# Patient Record
Sex: Female | Born: 1940 | Race: White | Hispanic: No | Marital: Married | Smoking: Never smoker
Health system: Southern US, Community
[De-identification: ages and names within clinical notes are randomized; demographics above are authoritative.]

## PROBLEM LIST (undated history)

## (undated) DIAGNOSIS — K219 Gastro-esophageal reflux disease without esophagitis: Secondary | ICD-10-CM

## (undated) DIAGNOSIS — E611 Iron deficiency: Secondary | ICD-10-CM

## (undated) DIAGNOSIS — F411 Generalized anxiety disorder: Secondary | ICD-10-CM

## (undated) DIAGNOSIS — IMO0002 Reserved for concepts with insufficient information to code with codable children: Secondary | ICD-10-CM

## (undated) DIAGNOSIS — E1140 Type 2 diabetes mellitus with diabetic neuropathy, unspecified: Secondary | ICD-10-CM

## (undated) DIAGNOSIS — N200 Calculus of kidney: Secondary | ICD-10-CM

## (undated) DIAGNOSIS — R197 Diarrhea, unspecified: Secondary | ICD-10-CM

## (undated) DIAGNOSIS — E11610 Type 2 diabetes mellitus with diabetic neuropathic arthropathy: Secondary | ICD-10-CM

## (undated) DIAGNOSIS — C50919 Malignant neoplasm of unspecified site of unspecified female breast: Secondary | ICD-10-CM

## (undated) DIAGNOSIS — G629 Polyneuropathy, unspecified: Secondary | ICD-10-CM

## (undated) DIAGNOSIS — F419 Anxiety disorder, unspecified: Secondary | ICD-10-CM

## (undated) DIAGNOSIS — B029 Zoster without complications: Secondary | ICD-10-CM

## (undated) DIAGNOSIS — E785 Hyperlipidemia, unspecified: Secondary | ICD-10-CM

## (undated) DIAGNOSIS — C4491 Basal cell carcinoma of skin, unspecified: Secondary | ICD-10-CM

## (undated) DIAGNOSIS — H269 Unspecified cataract: Secondary | ICD-10-CM

## (undated) DIAGNOSIS — S86012A Strain of left Achilles tendon, initial encounter: Secondary | ICD-10-CM

## (undated) HISTORY — DX: Reserved for concepts with insufficient information to code with codable children: IMO0002

## (undated) HISTORY — DX: Type 2 diabetes mellitus with diabetic neuropathic arthropathy: E11.610

## (undated) HISTORY — DX: Unspecified cataract: H26.9

## (undated) HISTORY — PX: OTHER SURGICAL HISTORY: SHX169

## (undated) HISTORY — DX: Hyperlipidemia, unspecified: E78.5

## (undated) HISTORY — PX: TUBAL LIGATION: SHX77

## (undated) HISTORY — DX: Basal cell carcinoma of skin, unspecified: C44.91

## (undated) HISTORY — PX: ABDOMINAL HYSTERECTOMY: SHX81

## (undated) HISTORY — DX: Generalized anxiety disorder: F41.1

## (undated) HISTORY — DX: Gastro-esophageal reflux disease without esophagitis: K21.9

## (undated) HISTORY — DX: Calculus of kidney: N20.0

## (undated) HISTORY — PX: FOOT SURGERY: SHX648

## (undated) HISTORY — DX: Zoster without complications: B02.9

## (undated) HISTORY — PX: EYE SURGERY: SHX253

---

## 1998-10-13 DIAGNOSIS — N200 Calculus of kidney: Secondary | ICD-10-CM

## 1998-10-13 HISTORY — DX: Calculus of kidney: N20.0

## 1999-09-05 ENCOUNTER — Encounter: Payer: Self-pay | Admitting: Emergency Medicine

## 1999-09-05 ENCOUNTER — Emergency Department (HOSPITAL_COMMUNITY): Admission: EM | Admit: 1999-09-05 | Discharge: 1999-09-05 | Payer: Self-pay | Admitting: Emergency Medicine

## 2002-10-13 DIAGNOSIS — B029 Zoster without complications: Secondary | ICD-10-CM

## 2002-10-13 HISTORY — DX: Zoster without complications: B02.9

## 2005-09-29 ENCOUNTER — Inpatient Hospital Stay (HOSPITAL_COMMUNITY): Admission: EM | Admit: 2005-09-29 | Discharge: 2005-10-03 | Payer: Self-pay | Admitting: Internal Medicine

## 2005-10-13 HISTORY — PX: SKIN GRAFT: SHX250

## 2005-10-15 ENCOUNTER — Encounter: Admission: RE | Admit: 2005-10-15 | Discharge: 2006-01-13 | Payer: Self-pay

## 2007-02-24 ENCOUNTER — Ambulatory Visit (HOSPITAL_COMMUNITY): Admission: RE | Admit: 2007-02-24 | Discharge: 2007-02-24 | Payer: Self-pay | Admitting: Family Medicine

## 2007-02-24 ENCOUNTER — Ambulatory Visit: Payer: Self-pay | Admitting: *Deleted

## 2007-07-13 LAB — FECAL OCCULT BLOOD, GUAIAC: Fecal Occult Blood: NEGATIVE

## 2008-02-23 ENCOUNTER — Ambulatory Visit (HOSPITAL_COMMUNITY): Admission: RE | Admit: 2008-02-23 | Discharge: 2008-02-23 | Payer: Self-pay | Admitting: Ophthalmology

## 2008-02-23 HISTORY — PX: OTHER SURGICAL HISTORY: SHX169

## 2009-11-28 ENCOUNTER — Ambulatory Visit: Admission: RE | Admit: 2009-11-28 | Discharge: 2009-11-28 | Payer: Self-pay | Admitting: Gynecologic Oncology

## 2009-12-25 ENCOUNTER — Ambulatory Visit (HOSPITAL_COMMUNITY): Admission: RE | Admit: 2009-12-25 | Discharge: 2009-12-27 | Payer: Self-pay | Admitting: Obstetrics & Gynecology

## 2009-12-25 ENCOUNTER — Encounter: Payer: Self-pay | Admitting: Obstetrics & Gynecology

## 2010-02-20 ENCOUNTER — Ambulatory Visit: Admission: RE | Admit: 2010-02-20 | Discharge: 2010-02-20 | Payer: Self-pay | Admitting: Gynecologic Oncology

## 2010-06-26 ENCOUNTER — Other Ambulatory Visit: Admission: RE | Admit: 2010-06-26 | Discharge: 2010-06-26 | Payer: Self-pay | Admitting: Gynecologic Oncology

## 2010-06-26 ENCOUNTER — Ambulatory Visit: Admission: RE | Admit: 2010-06-26 | Discharge: 2010-06-26 | Payer: Self-pay | Admitting: Gynecologic Oncology

## 2010-12-11 ENCOUNTER — Ambulatory Visit: Payer: Medicare Other | Attending: Gynecologic Oncology | Admitting: Gynecologic Oncology

## 2010-12-11 DIAGNOSIS — R32 Unspecified urinary incontinence: Secondary | ICD-10-CM | POA: Insufficient documentation

## 2010-12-11 DIAGNOSIS — R059 Cough, unspecified: Secondary | ICD-10-CM | POA: Insufficient documentation

## 2010-12-11 DIAGNOSIS — R05 Cough: Secondary | ICD-10-CM | POA: Insufficient documentation

## 2010-12-11 DIAGNOSIS — C549 Malignant neoplasm of corpus uteri, unspecified: Secondary | ICD-10-CM | POA: Insufficient documentation

## 2011-01-03 NOTE — Consult Note (Signed)
NAME:  Catherine Cooper, Catherine Cooper               ACCOUNT NO.:  [id number]  MEDICAL RECORD NO.:  [id number]           PATIENT TYPE:  LOCATION:                                 FACILITY:  PHYSICIAN:  Ireta Pullman A. Duard Brady, MD    DATE OF BIRTH:  Jul 23, 1941  DATE OF CONSULTATION:  12/11/2010 DATE OF DISCHARGE:                                CONSULTATION   HISTORY OF PRESENT ILLNESS:  Catherine Cooper is a very pleasant 70 year old who underwent robotic hysterectomy BSO and bilateral pelvic lymph node dissection in March 2011 for a stage IA, grade 1 endometrioid adenocarcinoma.  Final pathology revealed negative washings, a grade 1 lesion with 10% myometrial invasion, 8 negative lymph nodes and negative adnexal washings and no lymphovascular space involvement.  I last saw her in September 2011, at which time her exam and Pap smear were negative.  She comes in today for interval visit.  She is overall doing quite well and really denies any complaints.  REVIEW OF SYSTEMS:  She has gained about 12 pounds since we last saw her.  She states her appetite has increased significantly.  She was not able to tolerate the metformin due to diarrhea and then she was changed to Onglyza.  With that her appetite has increased, her sugars are little bit higher in the 140 range.  She no longer has diarrhea but she is somewhat disconcerted about the weight gain.  She does try to watch her diet.  She only serves herself once at meals and does not allow other servings.  She does not exercise as much as she thinks she should.  She does have a cough that has been worked up.  She has seen ENT.  She has had an endoscopy which was felt to be most consistent with reflux and esophageal spasms.  With her cough, she does have some urinary incontinence.  She otherwise does not have any leakage.  She was offered referral for urinary incontinence evaluation and she declined.  She does wear a pad every day.  She did have some epigastric pain  and was started on Dexilant and that has decreased her epigastric pain.  She denies any vaginal bleeding, any nausea, vomiting, fevers, chills, or shortness of breath.  MEDICATIONS:  Onglyza, Dexilant, Allegra, glipizide, alendronate,  lorazepam, vitamin D, Tums, baby aspirin, and Imodium.  ALLERGIES:  PENICILLIN which causes hives.  PHYSICAL EXAMINATION:  VITAL SIGNS:  Weight 164 pounds, blood pressure 140/68, pulse 74. GENERAL:  Well-nourished, well-developed female in no acute distress. NECK:  Supple.  There is no lymphadenopathy, no thyromegaly. LUNGS:  Clear to auscultation bilaterally. CARDIOVASCULAR:  Regular rate and rhythm. ABDOMEN:  Soft, nontender, nondistended.  There are no palpable masses or hepatosplenomegaly.  Groins are negative for adenopathy. EXTREMITIES:  There is no edema. PELVIC:  External genitalia is within normal limits though atrophic. The vagina is markedly atrophic as is the vaginal cuff.  The vaginal cuff has no visible lesions.  Bimanual examination reveals no masses or nodularity.  Rectal confirms.  ASSESSMENT:  A 70 year old with stage IA, grade 1 endometrioid adenocarcinoma who has no evidence of recurrent  disease.  PLAN:  Followup with results.  She will follow up with [place] in 6 months.     Floreine Kingdon A. Duard Brady, MD     PAG/MEDQ  D:  12/11/2010  T:  12/11/2010  Job:  [id number]  cc:   Telford Nab, R.N. [address] [place], [place] [address]  Ernestina Penna, M.D. Fax: [phone]  Paulene Floor, M.D.  Electronically Signed by Cleda Mccreedy MD on 12/16/2010 04:42:39 PM

## 2011-01-06 LAB — CBC
HCT: 35.9 % — ABNORMAL LOW (ref 36.0–46.0)
Hemoglobin: 11.5 g/dL — ABNORMAL LOW (ref 12.0–15.0)
MCHC: 32.1 g/dL (ref 30.0–36.0)
MCHC: 33 g/dL (ref 30.0–36.0)
MCV: 96.6 fL (ref 78.0–100.0)
Platelets: 129 10*3/uL — ABNORMAL LOW (ref 150–400)
RBC: 3.72 MIL/uL — ABNORMAL LOW (ref 3.87–5.11)
RDW: 14 % (ref 11.5–15.5)
WBC: 6.9 10*3/uL (ref 4.0–10.5)

## 2011-01-06 LAB — GLUCOSE, CAPILLARY
Glucose-Capillary: 104 mg/dL — ABNORMAL HIGH (ref 70–99)
Glucose-Capillary: 114 mg/dL — ABNORMAL HIGH (ref 70–99)
Glucose-Capillary: 183 mg/dL — ABNORMAL HIGH (ref 70–99)
Glucose-Capillary: 272 mg/dL — ABNORMAL HIGH (ref 70–99)
Glucose-Capillary: 98 mg/dL (ref 70–99)

## 2011-01-06 LAB — BASIC METABOLIC PANEL
Chloride: 101 mEq/L (ref 96–112)
GFR calc Af Amer: >60 mL/min (ref >60)
Potassium: 4.3 mEq/L (ref 3.5–5.1)
Sodium: 132 mEq/L — ABNORMAL LOW (ref 135–145)

## 2011-01-06 LAB — DIFFERENTIAL
Basophils Absolute: 0 10*3/uL (ref 0.0–0.1)
Eosinophils Relative: 4 % (ref 0–5)
Neutro Abs: 4.2 10*3/uL (ref 1.7–7.7)

## 2011-01-06 LAB — ABO/RH: ABO/RH(D): A POS

## 2011-01-06 LAB — URINALYSIS, MICROSCOPIC ONLY
Glucose, UA: NEGATIVE mg/dL
Nitrite: NEGATIVE
Specific Gravity, Urine: 1.008 (ref 1.005–1.030)

## 2011-01-06 LAB — TYPE AND SCREEN
ABO/RH(D): A POS
Antibody Screen: NEGATIVE

## 2011-01-06 LAB — COMPREHENSIVE METABOLIC PANEL
ALT: 17 U/L (ref 0–35)
AST: 20 U/L (ref 0–37)
CO2: 29 mEq/L (ref 19–32)
Creatinine, Ser: 1.12 mg/dL (ref 0.4–1.2)
GFR calc Af Amer: 58 mL/min — ABNORMAL LOW (ref >60)

## 2011-01-11 ENCOUNTER — Encounter: Payer: Self-pay | Admitting: *Deleted

## 2011-01-11 DIAGNOSIS — F411 Generalized anxiety disorder: Secondary | ICD-10-CM | POA: Insufficient documentation

## 2011-01-11 DIAGNOSIS — M810 Age-related osteoporosis without current pathological fracture: Secondary | ICD-10-CM | POA: Insufficient documentation

## 2011-01-11 DIAGNOSIS — H409 Unspecified glaucoma: Secondary | ICD-10-CM | POA: Insufficient documentation

## 2011-01-11 DIAGNOSIS — E1142 Type 2 diabetes mellitus with diabetic polyneuropathy: Secondary | ICD-10-CM | POA: Insufficient documentation

## 2011-01-11 DIAGNOSIS — E119 Type 2 diabetes mellitus without complications: Secondary | ICD-10-CM | POA: Insufficient documentation

## 2011-02-25 NOTE — Op Note (Signed)
NAME:  Catherine Cooper, Catherine Cooper               ACCOUNT NO.:  [id number]   MEDICAL RECORD NO.:  [id number]          PATIENT TYPE:  AMB   LOCATION:  SDS                          FACILITY:  [facility]   PHYSICIAN:  Alford Highland. Rankin, M.D.   DATE OF BIRTH:  30-Jul-1941   DATE OF PROCEDURE:  02/23/2008  DATE OF DISCHARGE:  02/23/2008                               OPERATIVE REPORT   PREOPERATIVE DIAGNOSES:  1. Rhegmatogenous detachment, right eye - macular.  2. Proliferative diabetic retinopathy, stable, status post previous      panretinal photocoagulation.   POSTOPERATIVE DIAGNOSES:  1. Rhegmatogenous detachment, right eye - macular.  2. Proliferative diabetic retinopathy, stable, status post previous      panretinal photocoagulation.   PROCEDURE:  1. Posterior vitrectomy, then endolaser panretinal photocoagulation -      25-gauge through panretinal detachment, right eye.  2. Injection of vitreous substitute --  SF6 20%, right eye.   SURGEON:  Alford Highland. Rankin, MD.   ANESTHESIA:  Local as per my anesthesia control.   INDICATIONS FOR PROCEDURE:  The patient is a 70 year old woman who had  basically an asymptomatic superior rhegmatogenous detachment to the  right eye and pseudophakic eye.  She has had previous panretinal  photocoagulation for advancing diabetic retinopathy.  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  She understands the risk of anesthesia,  including rate occurrence of death, loss of the eye including but not  limited to hemorrhage, infection, scarring, need for another surgery, no  change in vision, loss of vision, and progressive disease despite  intervention.  Appropriate signed consent was obtained.   The patient was taken to the operating room.  In the operating room,  appropriate monitors followed by mild sedation.  Xylocaine 2% injected  retrobulbar with additional 5 mL laterally in fashion of modified Darel Hong.  The right periocular region was then sterilely prepped and draped  in usual ophthalmic fashion.  Lid speculum was applied.  At this time, a  25-gauge trocar was placed in the inferotemporal quadrant.  Infusion  turned on.  Superior trocar was applied.  Core vitrectomy was then  begun.  Position-induced posterior vitreous detachment was required.  This was interesting because of the nature of the peripheral retinal  detachment with posterior located tear suggested only a partial  posterior vitreous detachment in the first place.   The vitreous was elevated, anterior to the equator 360 degrees and  trimmed.  Scleral depression  was then used to trim the vitreous space.  Anterior hyaloid was clearly removed and at this time fluid-fluid  exchange carried out through the break.  A smaller posterior retinotomy  in the posterior portion of the detachment was then created with  vitrectomy instrumentation.  This allowed all fluid drainage to be  carried out.  Thereafter, the fluid-fluid exchange completed, using soft-  tipped extrusion needle, the remainder of the subretinal fluids removed.  Retina reattached nicely.  Retinopexy was then  carried out. This was  carried out on the bed of the detachment as well as around the retinal  break and as well as the retinotomy site.  The retina attached nicely.  No complications occurred.  At this  time, air - SF6 exchange was then carried out.  SF6 20% concentration  was used.  Split trocar was removed.  The infusion removed.  Subconjunctival Decadron applied.  Sterile patch and Fox shield applied.  The patient tolerated the procedure well without complications.      Alford Highland Rankin, M.D.  Electronically Signed     GAR/MEDQ  D:  02/23/2008  T:  02/24/2008  Job:  [id number]

## 2011-02-28 NOTE — Op Note (Signed)
NAME:  Catherine, Cooper NO.:  [id number]   MEDICAL RECORD NO.:  [id number]          PATIENT TYPE:  INP   LOCATION:  5041                         FACILITY:  [facility]   PHYSICIAN:  Almedia Balls. Ranell Patrick, M.D. DATE OF BIRTH:  July 10, 1941   DATE OF PROCEDURE:  DATE OF DISCHARGE:                                 OPERATIVE REPORT   PREOPERATIVE DIAGNOSIS:  Right foot diabetic infection.   POSTOPERATIVE DIAGNOSIS:  Right foot diabetic infection.   PROCEDURE PERFORMED:  Irrigation, debridement, incision, and drainage right  foot infection.   SURGEON:  Almedia Balls. Ranell Patrick, M.D.   ASSISTANT:  Donnie Coffin. Dixon, P.A.-C.   ANESTHESIA:  General .   ESTIMATED BLOOD LOSS:  Minimal.   FLUIDS REPLACED:  400 mL crystalloid.   COUNTS:  Correct.   COMPLICATIONS:  None.   Preoperative antibiotics given.   INDICATIONS FOR PROCEDURE:  The patient is a 70 year old recently diagnosed  insulin dependent diabetic with a history of 48 hours worsening right foot  pain, redness, and concern for infection.  The patient's preoperative  radiographs were negative for osteomyelitis, however, the patient does have  an obvious infection between the fourth and fifth toe on the right foot.  She also has a heel ulcer which apparently has been fairly long-standing on  the left heel, possibly since August.  That heel ulcer looks like it can be  managed by the wound care team without surgery.  The right foot, however,  does have evidence of necrotic tissue that requires urgent debridement.  Informed consent was obtained.   DESCRIPTION OF PROCEDURE:  After an adequate level of anesthesia was  achieved, the patient was positioned supine on the operating table, a non-  sterile tourniquet was placed on the right calf.  The right leg was  sterilely prepped and draped in the usual manner.  We made a longitudinal  skin incision in the web space between the fourth and fifth toe extending up  over the dorsal  aspect of the intermetatarsal area.  We then spread bluntly  using hemostats, thrombosed veins were noted.  Significant necrotic tissue  was noted in that web space.  This was all debrided back to healthy,  bleeding tissue.  The joint capsules were not violated, both on the fourth  metatarsal phalangeal joint and the fifth metatarsal phalangeal joint.  We  did spread down to the interspace between the fourth and fifth metatarsals  and the third and fourth metatarsals to make sure there was not any pus,  none was noted, but  some serous fluid did come out of those areas, it looked like mostly edema.  We did a thorough irrigation of all these areas using pulsatile irrigation  with 3 liters of irrigation.  We then packed the wound using normal saline  moistened sponges and placed a short leg splint.  The patient tolerated the  procedure well.           ______________________________  Almedia Balls. Ranell Patrick, M.D.     SRN/MEDQ  D:  09/30/2005  T:  10/02/2005  Job:  [id number]

## 2011-02-28 NOTE — H&P (Signed)
NAME:  Catherine Cooper, Catherine Cooper NO.:  [id number]   MEDICAL RECORD NO.:  [id number]          PATIENT TYPE:  INP   LOCATION:  5041                         FACILITY:  [facility]   PHYSICIAN:  Hillery Aldo, M.D.   DATE OF BIRTH:  [birth year]   DATE OF ADMISSION:  09/29/2005  DATE OF DISCHARGE:                                HISTORY & PHYSICAL   PRIMARY CARE PHYSICIAN:  Dr. Christell Constant, [facility]   CHIEF COMPLAINT:  Foot swelling.   HISTORY OF PRESENT ILLNESS:  The patient is a 70 year old female who was  referred for direct admission by her primary care physician after being seen  today. Apparently, she is newly diagnosed with diabetes and presented to her  primary care physician with complaints of right foot swelling and erythema  for the past 2 days. She had not been seen by a physician for approximately  2 years' time, and upon initial evaluation, was noted to have a blood  glucose of 328. The patient reports that approximately 2 weeks ago, she fell  down the stairs and did not notice any specific injury to her right foot at  that time. She did not have any pain in the foot. Over the past 2 days, she  has noticed tightness, intermittent swelling, particularly when she is been  on her feet, and now progressive erythema. She also has an ulcer on her left  heel that dates back to August 2006 when she was at the beach and noticed an  abrasion of the left heel. She has been doctoring at home with Neosporin  ointment. The sore has not healed. The patient denies that she has noticed  any decreased sensation of her feet until she was examined by her physician  tonight, and she noticed it did not hurt her when he was manipulating her  wounds. She has reported paresthesias of her bilateral feet described as a  burning and falling-asleep sensation for the past several months. The  patient denies that she had any idea she was diabetic before visiting her  primary care physician  tonight. The only other symptoms were a 24-hour  history of nausea, vomiting, diarrhea, and chills.   PAST MEDICAL HISTORY:  1.  Shingles in 2004.  2.  Nephrolithiasis.  3.  History of tubal ligation.  4.  Cataracts.   ALLERGIES:  PENICILLIN causes hives.   CURRENT MEDICATIONS:  None. She does use Tylenol over-the-counter p.r.n.   FAMILY HISTORY:  The patient's father is deceased from pneumonia at age 57.  He also suffered with dementia and diabetes. Her mother is alive at age 21  and has asthma. She has one brother who died in infancy. She has two  daughters that are healthy and one son who died at age 64 from suicide.   SOCIAL HISTORY:  She is married and lives in [place] with her husband. She  is a lifelong nonsmoker. Denies tobacco or drug use. She currently works as  a Diplomatic Services operational officer for the [employer] and is set to retire this  week.   REVIEW OF SYSTEMS:  Fever and chills over the past 24 hours. No weight  changes. No vision changes. No chest pain or arrhythmia. She does have some  dyspnea on exertion. She has had a cough times several months that is  occasionally productive. Over the past 24 hours, she had a bout of abdominal  cramping and diarrhea with nausea and vomiting yesterday x1. No melena or  hematochezia. No dysuria. She does notice some increased frequency of  urination, particularly at night. Has not noticed any increase in thirst.  She has been fatigued.   PHYSICAL EXAMINATION:  VITAL SIGNS: Temperature 98.3, pulse 102,  respirations 18, blood pressure 144/67, O2 saturation 98% on room air.  CBG  was 357.  GENERAL: This a well-developed, well-nourished female in no acute distress.  HEENT: Normocephalic, atraumatic. PERRLA.  EOMI. Sclerae nonicteric.  Oropharynx reveals poor dentition with dental caries. Moist mucous  membranes.  NECK: Supple, no thyromegaly, no lymphadenopathy, no jugular venous  distension. No carotid bruits.  CHEST: Lungs  clear to auscultation bilaterally with good air movement. No  rales or wheezes.  HEART: Regular rate, rhythm. No murmurs, rubs, gallops. Normal S1, S2.  ABDOMEN: Soft, nontender, nondistended with normoactive bowel sounds.  EXTREMITIES:  The patient has an active cellulitis with erythema on the  right dorsum of her foot. There is a necrotic area between the great and  second toe with foul-smelling drainage. The left foot has a heel ulcer  approximately 1 cm in diameter and 0.75 cm deep with a necrotic base. There  is no drainage from that area. Diminished pulses.  NEUROLOGIC: The patient is alert and oriented x3. Cranial nerves II-XII  grossly intact. Moves all extremities x4, equal strength. She has decreased  sensation to bilateral lower extremities and decreased reflexes at patellar  and Achilles tendons.   DATA REVIEWED:  CBC from her primary care physician's office done today  reveals a hemoglobin of 12.2, hematocrit 37.2, white blood cell count 18.3,  platelet count 241 with an absolute neutrophil count of 15.4. Hemoglobin A1c  was reported to be 9.8%. Chemistries, urinalysis, and other laboratory  studies ordered here are pending.   Radiographic data: Plain films of the bilateral feet were ordered and are  pending.   ASSESSMENT AND PLAN:  1.  Diabetic foot ulcer with cellulitis: The patient has advanced cellulitis      of the right foot and an extensive diabetic foot ulcer on the left. The      appearance of these wounds are worrisome for deep infection and possible      osteomyelitis. We will admit the patient for IV antibiotics and use      levofloxacin and Flagyl given her PENICILLIN allergy. Will obtain      surgical consultation in the morning for debridement. We will ask the      wound RN to give [place] recommendations for wound care as well.  2.  Diabetes: This is apparently a new diagnosis, although she likely has     had this for some time. Will start her on Glucotrol 5 mg  twice daily and      sliding scale insulin and reassess her glycemic control. With her      hemoglobin A1c of 9.8%, she will likely need dose titration upward of      oral medications and possibly insulin for long-term management. She      needs extensive diabetes education with the diabetic coordinator which      we will order.  Will also check a fasting lipid panel to risk stratify      her further, and once she is evaluated by the surgical team, we will      start aspirin after her surgical procedure. We will check a 12-lead EKG      and a chest x-ray given her likely      surgical intervention.  3.  Prophylaxis: Will initiate PAS hose until she can be started on Lovenox      after surgery and a proton pump inhibitor.           ______________________________  Hillery Aldo, M.D.     CR/MEDQ  D:  09/29/2005  T:  09/30/2005  Job:  [id number]   cc:   Ernestina Penna, M.D.  Fax: [phone]

## 2011-02-28 NOTE — Discharge Summary (Signed)
NAME:  Catherine Cooper, Catherine Cooper NO.:  [id number]   MEDICAL RECORD NO.:  [id number]          PATIENT TYPE:  INP   LOCATION:  5041                         FACILITY:  [facility]   PHYSICIAN:  Elliot Cousin, M.D.    DATE OF BIRTH:  13-Nov-1940   DATE OF ADMISSION:  09/29/2005  DATE OF DISCHARGE:  10/03/2005                                 DISCHARGE SUMMARY   DISCHARGE DIAGNOSES:  1.  Right foot ulcer with cellulitis and abscess, status post irrigation,      debridement, incision and drainage per Dr. Langston Masker on [id number].      The abscess culture grew out moderate group B strep and moderate      Staphylococcus species disease (coagulase negative).  2.  Left heel ulcer.  3.  Type 2 diabetes mellitus, new diagnosis.  4.  Postoperative anemia.  5.  Hypokalemia.   DISCHARGE DIAGNOSES:  1  Shingles in 2004.  1.  Nephrolithiasis.  2.  History of tubal ligation.  3.  Cataracts.   CONSULTATIONS:  Dr. Ellie Lunch.   PROCEDURE PERFORMED:  1.  MRI of the right foot.  The results revealed soft tissue air along the      dorsum of the foot with inflammatory changes about the fourth      metacarpophalangeal articulation. Suspicion for early osteomyelitis      involving the proximal phalanx.  2.  MRI of the left foot.  Soft tissue ulceration along the plantar aspect      of the foot is noted.  There is subcutaneous edema.  No underlying soft      tissue abscess is seen. No abnormal enhancement to suggest      osteomyelitis.  3.  Arterial Doppler study.  The ABI's and pedal wave forms are normal at      rest.   HISTORY OF PRESENT ILLNESS:  The patient is a 70 year old lady who was  directly admitted to the hospital on December18,2006 after her primary care  physician evaluated her in the office.  The patient complained of a two day  history of right foot swelling and erythema. The patient's blood sugar was  noted to be 328 in the office.  The patient had no prior known history of  diabetes mellitus.  She does admit that she had not been evaluated by any  doctor in several years.  Given the revelation of newly diagnosed diabetes  mellitus and an apparent foot infection, the patient was admitted for  further evaluation and management.   HOSPITAL COURSE:  Problem 1. Right foot ulcer with cellulitis and abscess  and left heel ulcer.  Specimens were obtained from the ulcer on the right  foot on admission.  They were sent for Gram stain and culture.  The patient  was then started empirically on Avelox 400 mg IV daily and Flagyl 500 mg  q.8h. IV.  The patient has a penicillin allergy.  X-rays of both feet were  ordered for further evaluation.  The left foot x-ray revealed evidence of  the plantar ulcer at the left heel with osteoporosis and no  acute bony  abnormality. The x-ray of the right foot revealed diffuse osteoporosis,  mildly scattered soft tissue swelling, soft tissue gas versus ulcer at the  base of the fourth toe, no fracture dislocation or bone destruction.  Following these findings a MRI of the left and right feet were ordered.  The  MRI of the left foot revealed soft tissue ulceration without any evidence of  underlying abscess or osteomyelitis.  The MRI of the right foot revealed  soft tissue air along the dorsum of the foot with inflammatory changes at  the fourth metacarpophalangeal articulation.  There was a suspicion of early  osteomyelitis per the radiologist, Dr. Barrington Ellison.  Subsequently, orthopedic  surgeon, Dr. Langston Masker was consulted.  Per his evaluation of the right foot the  patient needed urgent incision and drainage of the abscess.  The patient  underwent irrigation, debridement, incision and drainage of the right foot  infection on December19,2006.  Following the operation the wound care nurse  made recommendations regarding therapy.  In addition, whirlpool therapy was  started per the instructions by Dr. Langston Masker.  The left foot ulcer was not   debrided, however, Accuzyme was applied daily followed by a wet-to-dry  dressing changes.   In addition to the drainage specimen that was obtained prior to the  operation, Dr. Ranell Patrick also sent a specimen for Gram stain and culture.  Both  specimens grew out a moderate amount of group D Streptococcus.  The  Streptococcus was sensitive and susceptible to Avelox.  The patient's white  blood cell count increased to 15.8 following the surgery.  She was afebrile  on admission, however, following the surgery, she did spike a temperature of  101.3 only once.  Blood cultures were not ordered at that time.  The  following day, the patient's fever resolved.  Per the recommendations of Dr.  Ranell Patrick, the patient will need to receive ongoing outpatient wound care. The  home health services were consulted to provide wet-to-dry dressing changes  on a daily basis.  The patient and the patient's family will be instructed  to apply the wet-to-dry dressing changes at least twice daily.  Accuzyme  should be applied only to the left heel covered by a wet-to-dry dressing  daily.  Outpatient whirlpool therapy would have been ideal, however, no  agency provides whirlpool therapy or pulse lavage therapy.  The patient will  continue on Flagyl 500 mg t.i.d. for an additional 10 days.  The Avelox was  discontinued at the time of hospital discharge, however, she was given a  prescription for Levaquin 750 mg daily instead.  The Levaquin course should  continue for an additional 10 days as well.  The patient was totally  afebrile and asymptomatic at the time of hospital discharge.  Her white  blood cell count was 9.0 prior to hospital discharge.  She was advised to  follow up with Dr. Ranell Patrick in one week.  She was advised to follow  up with  Dr. Christell Constant on December29,2006.   Problem 2.  Diabetes mellitus, new diagnosis.  As stated above, the patient's capillary blood sugar in Dr. Kathi Der office was 328.  On arrival  to the  hospital, her venous glucose was 307.  The patient was started on  therapy with Lantus, Glucotrol and a sliding scale insulin regimen q.a.c.  h.s. with NovoLog.  Her capillary blood sugars were monitored q.a.c. q.h.s.  The registered dietician was consulted to provide diabetes education,  including glucose monitoring and nutrition.  Over the course of the  hospitalization, the patient was allowed to monitor her capillary blood  sugars with finger sticks and she was also allowed to give herself insulin  injections.  As the patient's capillary blood sugars slowly improved,  Glucotrol was increased to 5 mg b.i.d. and Metformin 500 mg b.i.d. was  added.  Eventually the sliding scale insulin regimen and the Lantus were  both discontinued.  Prior to hospital discharge, the patient's capillary  blood sugars on Glipizide and Metformin ranged from 120 to 160.  The patient  was advised to monitor her capillary blood sugars at home at least twice  daily.  She was advised to not take the oral hypoglycemic agents if her  capillary blood sugar fell below 100.  A fasting lipid panel was assessed  during hospital course and found to be within normal limits with a total  cholesterol of 125, triglycerides of 70, HDL cholesterol of 38, and LDL  cholesterol of 72. A hemoglobin A1C was not ordered during hospital course  because an outpatient hemoglobin A1C had already been performed. It was 9.8.   Problem 3.  Postoperative anemia.  The patient's hemoglobin fell slightly to  10.1 following the operation. The patient was advised to take a multivitamin  with iron at the time of hospital discharge.   Problem 4.  Hypokalemia.  As the patient's capillary blood sugars decreased,  so did her serum potassium.  Her serum potassium fell to a low of 3.0.  A  magnesium level was assessed and found to be within normal limits at 1.9.  She was repleted with potassium chloride by mouth.  At the time of hospital  discharge,  her serum potassium was within normal limits at 4.0.      Elliot Cousin, M.D.  Electronically Signed     DF/MEDQ  D:  10/05/2005  T:  10/07/2005  Job:  [id number]   cc:   Ernestina Penna, M.D.  Fax: [phone]   Almedia Balls. Ranell Patrick, M.D.  Fax: [phone]

## 2011-06-04 ENCOUNTER — Ambulatory Visit: Payer: Medicare Other | Attending: Gynecologic Oncology | Admitting: Gynecologic Oncology

## 2011-06-04 ENCOUNTER — Other Ambulatory Visit (HOSPITAL_COMMUNITY)
Admission: RE | Admit: 2011-06-04 | Discharge: 2011-06-04 | Disposition: A | Discharge status: Home / Self Care | Payer: Medicare Other | Source: Ambulatory Visit | Attending: Gynecologic Oncology | Admitting: Gynecologic Oncology

## 2011-06-04 ENCOUNTER — Other Ambulatory Visit: Payer: Self-pay | Admitting: Gynecologic Oncology

## 2011-06-04 DIAGNOSIS — Z8542 Personal history of malignant neoplasm of other parts of uterus: Secondary | ICD-10-CM | POA: Insufficient documentation

## 2011-06-04 DIAGNOSIS — Z8540 Personal history of malignant neoplasm of unspecified female genital organ: Secondary | ICD-10-CM | POA: Insufficient documentation

## 2011-06-04 DIAGNOSIS — Z7982 Long term (current) use of aspirin: Secondary | ICD-10-CM | POA: Insufficient documentation

## 2011-06-04 DIAGNOSIS — Z880 Allergy status to penicillin: Secondary | ICD-10-CM | POA: Insufficient documentation

## 2011-06-04 DIAGNOSIS — Z09 Encounter for follow-up examination after completed treatment for conditions other than malignant neoplasm: Secondary | ICD-10-CM | POA: Insufficient documentation

## 2011-06-04 DIAGNOSIS — Z9079 Acquired absence of other genital organ(s): Secondary | ICD-10-CM | POA: Insufficient documentation

## 2011-06-04 DIAGNOSIS — Z79899 Other long term (current) drug therapy: Secondary | ICD-10-CM | POA: Insufficient documentation

## 2011-06-04 DIAGNOSIS — A5211 Tabes dorsalis: Secondary | ICD-10-CM | POA: Insufficient documentation

## 2011-06-04 DIAGNOSIS — R5381 Other malaise: Secondary | ICD-10-CM | POA: Insufficient documentation

## 2011-06-11 NOTE — Consult Note (Signed)
NAME:  Catherine Cooper, DURA NO.:  [id number]  MEDICAL RECORD NO.:  [id number]  LOCATION:  GYN                          FACILITY:  [facility]  PHYSICIAN:  Paola A. Duard Brady, MD    DATE OF BIRTH:  1941-07-17  DATE OF CONSULTATION:  06/04/2011 DATE OF DISCHARGE:                                CONSULTATION   HISTORY:  Catherine Cooper is a very pleasant 70 year old with history of a stage IA, grade 1 endometrioid adenocarcinoma who underwent robotic hysterectomy, BSO, and bilateral pelvic lymph node dissection in March 2011.  Final pathology revealed a grade 1 endometrioid adenocarcinoma with 10% myometrial invasion, negative lymph nodes, negative adnexa, no lymphovascular space involvement, and negative washings.  I last saw her in February 2012, at which time her exam was unremarkable.  Her last Pap smear was in September 2011 and it was negative.  She comes in today for followup.  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  She has some swelling of her left medial malleolus and some pain on the top of her foot.  She is very worried about walking or exercise, as she does not want to make the arthropathy any worse.  She is able to walk.  She does not really have any pain.  She occasionally has what she describes as a "rolling" pain across the epigastric region and has been told by her primary physician that this may be gas and stool crossing through the transverse colon.  It is fleeting, it goes away, and she does believe that that is correct.  She does have some occasional shortness of breath, but she feels it is due to her becoming deconditioned, as her activity level has been very low as she had a boot on for her arthropathy on her left foot.  She does have a cough, and when she has a cough she does have  some urinary incontinence, it is no different.  She does wear a pad every day and has some irritation on her vulva today from the pad, she noticed this when wiping.  With regard to the cough, the workup has essentially been negative.  They were thinking that it was related to gastroesophageal reflux disease, but that evaluation has been negative.  She denies any other change in bowel or bladder habits, any nausea, vomiting, fever, chills, headaches, visual changes, unintentional weight loss/weight gain.  10-point review of systems is otherwise negative.  MEDICATIONS: 1. Onglyza 5 mg daily. 2. Dexilant 60 mg daily. 3. Allegra daily. 4. Glipizide 10 mg daily. 5. Alendronate 70 mg weekly. 6. Lorazepam 0.5 mg p.r.n. 7. Vitamin D, 8. Baby aspirin. 9. Imodium.  ALLERGIES:  PENICILLIN causes hives.  PHYSICAL EXAMINATION:  VITAL SIGNS:  Weight 168 pounds which is up 4 pounds, blood pressure 120/56, pulse 70, respirations 18, temperature 98.6. GENERAL:  A well-nourished, well-developed female, in no acute distress. NECK:  Supple.  There is no lymphadenopathy, no thyromegaly. LUNGS:  Clear to auscultation bilaterally.  CARDIOVASCULAR:  Regular rate and rhythm. ABDOMEN:  Soft, nontender, nondistended.  There are no palpable masses or hepatosplenomegaly.  There are no incisional hernias.  Groins are negative for adenopathy. EXTREMITIES:  There is trace edema, equal bilaterally. PELVIC:  External genitalia is within normal limits, though atrophic. The vagina is markedly atrophic.  The vaginal cuff is without lesions. ThinPrep Pap submitted without difficulty.  Bimanual examination reveals no masses or nodularity. RECTAL:  Confirms.  ASSESSMENT:  A 70 year old with stage IA, grade 1 endometrioid adenocarcinoma who clinically has no evidence of recurrent disease.  PLAN:  We will follow up on the results for her Pap smear from today. She will return to see [place] in 6 months.  She will follow  up with her other physicians as scheduled.     Paola A. Duard Brady, MD     PAG/MEDQ  D:  06/04/2011  T:  06/05/2011  Job:  [id number]  cc:   Telford Nab, R.N. [address] [place], [place] [address]  Ernestina Penna, M.D. Fax: [phone]   Electronically Signed by Cleda Mccreedy MD on 06/11/2011 01:36:38 PM

## 2011-08-27 ENCOUNTER — Encounter: Payer: Self-pay | Admitting: Cardiology

## 2011-09-12 ENCOUNTER — Encounter: Payer: Self-pay | Admitting: *Deleted

## 2011-09-16 ENCOUNTER — Ambulatory Visit (INDEPENDENT_AMBULATORY_CARE_PROVIDER_SITE_OTHER): Payer: Medicare Other | Admitting: Cardiology

## 2011-09-16 ENCOUNTER — Encounter: Payer: Self-pay | Admitting: Cardiology

## 2011-09-16 DIAGNOSIS — R5383 Other fatigue: Secondary | ICD-10-CM

## 2011-09-16 DIAGNOSIS — R079 Chest pain, unspecified: Secondary | ICD-10-CM

## 2011-09-16 DIAGNOSIS — R5381 Other malaise: Secondary | ICD-10-CM

## 2011-09-16 DIAGNOSIS — I10 Essential (primary) hypertension: Secondary | ICD-10-CM | POA: Insufficient documentation

## 2011-09-16 DIAGNOSIS — E119 Type 2 diabetes mellitus without complications: Secondary | ICD-10-CM

## 2011-09-16 NOTE — Progress Notes (Signed)
HPI The patient is referred for evaluation of excessive fatigue and dyspnea. She has no prior cardiac history but she does have long-standing diabetes. She does report a stress test several years ago but she was unable to complete this treadmill test because of back pain. Over the past 6 months she's had increasing symptoms of decreased exercise tolerance. With activity such as climbing a flight of stairs she will become very fatigued. Doing household chores she has to stop. She rests for a while and then she can proceed. However, she thinks the level of fatigue is out of proportion to what she is doing. She does get some dyspnea with these activities. She does not describe resting shortness of breath, PND or orthopnea. She does not have palpitations, presyncope or syncope. She has no weight gain or edema. She does not describe chest pressure, neck or arm discomfort.  Allergies  Allergen Reactions  . Penicillins Rash    Current Outpatient Prescriptions  Medication Sig Dispense Refill  . alendronate (FOSAMAX) 70 MG tablet Take 70 mg by mouth every 7 (seven) days. Take with a full glass of water on an empty stomach.       Marland Kitchen aspirin 81 MG tablet Take 81 mg by mouth daily.        Marland Kitchen atorvastatin (LIPITOR) 20 MG tablet Take 20 mg by mouth daily.        . brimonidine (ALPHAGAN P) 0.1 % SOLN Place 1 drop into both eyes 2 (two) times daily.        . Cholecalciferol (VITAMIN D3) 2000 UNITS TABS Take by mouth.        . dexlansoprazole (DEXILANT) 60 MG capsule Take 60 mg by mouth daily.        Marland Kitchen glipiZIDE (GLUCOTROL) 5 MG 24 hr tablet Take 10 mg by mouth daily.       Marland Kitchen ketotifen (ALAWAY) 0.025 % ophthalmic solution 1 drop 2 (two) times daily.        Marland Kitchen L-Methylfolate-B6-B12 (METANX) 3-35-2 MG TABS Take by mouth.        . Multiple Vitamin (MULTI VITAMIN MENS PO) Take by mouth.        . saxagliptin HCl (ONGLYZA) 5 MG TABS tablet Take 5 mg by mouth daily.        . vitamin B-12 (CYANOCOBALAMIN) 1000 MCG  tablet Take 1,000 mcg by mouth daily.        Marland Kitchen loperamide (IMODIUM A-D) 2 MG tablet Take 2 mg by mouth 4 (four) times daily as needed.          Past Medical History  Diagnosis Date  . Diabetes mellitus     type II  . Osteoporosis   . Glaucoma   . GAD (generalized anxiety disorder)   . GERD (gastroesophageal reflux disease)   . Hyperlipidemia   . Endometrioid adenocarcinoma   . H/O: hysterectomy   . 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 Shingles 2004  . Nephrolithiasis   . Cataracts, bilateral     Past Surgical History  Procedure Date  .  vitrectomy 02/23/08    posterior; right eye  . Tubal ligation     Family History  Problem Relation Age of Onset  . Dementia Father   . Diabetes Father     History   Social History  . Marital Status: Married    Spouse Name: N/A    Number of Children: 3  . Years of  Education: N/A   Occupational History  . retired     [employer]   Social History Main Topics  . Smoking status: Unknown If Ever Smoked  . Smokeless tobacco: Not on file  . Alcohol Use: No  . Drug Use: No  . Sexually Active:    Other Topics Concern  . Not on file   Social History Narrative   2 healthy daughters and 1 son deceased (suicide @ 71)    ROS:  Positive for occasional headaches, ankle swelling, arthritis in her hands, back pain and left foot pain.  Otherwise as stated in the HPI and negative for all other systems.  PHYSICAL EXAM BP 140/72  Pulse 80  Resp 18  Ht 5\' 4"  (1.626 m)  Wt 172 lb (78.019 kg)  BMI 29.52 kg/m2 GENERAL:  Well appearing HEENT:  Pupils equal round and reactive, fundi not visualized, oral mucosa unremarkable, poor dentition NECK:  No jugular venous distention, waveform within normal limits, carotid upstroke brisk and symmetric, no bruits, no thyromegaly LYMPHATICS:  No cervical, inguinal adenopathy LUNGS:  Clear to auscultation bilaterally BACK:  No CVA  tenderness CHEST:  Unremarkable HEART:  PMI not displaced or sustained,S1 and S2 within normal limits, no S3, no S4, no clicks, no rubs, no murmurs ABD:  Flat, positive bowel sounds normal in frequency in pitch, no bruits, no rebound, no guarding, no midline pulsatile mass, no hepatomegaly, no splenomegaly EXT:  2 plus pulses throughout, no edema, no cyanosis no clubbing SKIN:  No rashes no nodules NEURO:  Cranial nerves II through XII grossly intact, motor grossly intact throughout PSYCH:  Cognitively intact, oriented to person place and time  EKG:  Sinus rhythm, rate 83, axis within normal limits, intervals within normal limits, no acute ST-T wave changes.  ASSESSMENT AND PLAN

## 2011-09-16 NOTE — Assessment & Plan Note (Signed)
Her blood pressure is borderline. However, she reports that it is not usually elevated.  She will watch this at home.  No change in therapy is indicated.

## 2011-09-16 NOTE — Assessment & Plan Note (Signed)
She reports that her sugars recently have been elevated. Bennie Pierini, FNP has been actively managing this.  I will defer to her expertise.

## 2011-09-16 NOTE — Assessment & Plan Note (Signed)
Given her risk factors particularly the diabetes she certainly could have obstructive coronary disease manifesting as fatigue. She needs a stress test but would not be able to walk on a treadmill. Therefore, she will need Lexiscan Myoview.

## 2011-09-16 NOTE — Patient Instructions (Signed)
The current medical regimen is effective;  continue present plan and medications.'  Your physician has requested that you have a lexiscan myoview. For further information please visit [email or web address] Please follow instruction sheet, as given.  Chest Pain (Nonspecific) It is often hard to give a specific diagnosis for the cause of chest pain. There is always a chance that your pain could be related to something serious, such as a heart attack or a blood clot in the lungs. You need to follow up with your caregiver for further evaluation. CAUSES   Heartburn.   Pneumonia or bronchitis.   Anxiety and stress.   Inflammation around your heart (pericarditis) or lung (pleuritis or pleurisy).   A blood clot in the lung.   A collapsed lung (pneumothorax). It can develop suddenly on its own (spontaneous pneumothorax) or from injury (trauma) to the chest.  The chest wall is composed of bones, muscles, and cartilage. Any of these can be the source of the pain.  The bones can be bruised by injury.   The muscles or cartilage can be strained by coughing or overwork.   The cartilage can be affected by inflammation and become sore (costochondritis).  DIAGNOSIS  Lab tests or other studies, such as X-rays, an EKG, stress testing, or cardiac imaging, may be needed to find the cause of your pain.  TREATMENT   Treatment depends on what may be causing your chest pain. Treatment may include:   Acid blockers for heartburn.   Anti-inflammatory medicine.   Pain medicine for inflammatory conditions.   Antibiotics if an infection is present.   You may be advised to change lifestyle habits. This includes stopping smoking and avoiding caffeine and chocolate.   You may be advised to keep your head raised (elevated) when sleeping. This reduces the chance of acid going backward from your stomach into your esophagus.   Most of the time, nonspecific chest pain will improve within 2 to 3 days with rest and  mild pain medicine.  HOME CARE INSTRUCTIONS   If antibiotics were prescribed, take the full amount even if you start to feel better.   For the next few days, avoid physical activities that bring on chest pain. Continue physical activities as directed.   Do not smoke cigarettes or drink alcohol until your symptoms are gone.   Only take over-the-counter or prescription medicine for pain, discomfort, or fever as directed by your caregiver.   Follow your caregiver's suggestions for further testing if your chest pain does not go away.   Keep any follow-up appointments you made. If you do not go to an appointment, you could develop lasting (chronic) problems with pain. If there is any problem keeping an appointment, you must call to reschedule.  SEEK MEDICAL CARE IF:   You think you are having problems from the medicine you are taking. Read your medicine instructions carefully.   Your chest pain does not go away, even after treatment.   You develop a rash with blisters on your chest.  SEEK IMMEDIATE MEDICAL CARE IF:   You have increased chest pain or pain that spreads to your arm, neck, jaw, back, or belly (abdomen).   You develop shortness of breath, an increasing cough, or you are coughing up blood.   You have severe back or abdominal pain, feel sick to your stomach (nauseous) or throw up (vomit).   You develop severe weakness, fainting, or chills.   You have an oral temperature above 102 F (38.9 C), not controlled  by medicine.  THIS IS AN EMERGENCY. Do not wait to see if the pain will go away. Get medical help at once. Call your local emergency services (911 in [place] Do not drive yourself to the hospital. MAKE SURE YOU:   Understand these instructions.   Will watch your condition.   Will get help right away if you are not doing well or get worse.  Document Released: 07/09/2005 Document Revised: 06/11/2011 Document Reviewed: 05/04/2008 [facility] Patient Information 2012  [place], [place].

## 2011-09-24 ENCOUNTER — Ambulatory Visit (HOSPITAL_COMMUNITY): Payer: Medicare Other | Attending: Internal Medicine | Admitting: Radiology

## 2011-09-24 DIAGNOSIS — R5381 Other malaise: Secondary | ICD-10-CM | POA: Insufficient documentation

## 2011-09-24 DIAGNOSIS — I10 Essential (primary) hypertension: Secondary | ICD-10-CM | POA: Insufficient documentation

## 2011-09-24 DIAGNOSIS — R0602 Shortness of breath: Secondary | ICD-10-CM | POA: Insufficient documentation

## 2011-09-24 DIAGNOSIS — Z8249 Family history of ischemic heart disease and other diseases of the circulatory system: Secondary | ICD-10-CM | POA: Insufficient documentation

## 2011-09-24 DIAGNOSIS — R079 Chest pain, unspecified: Secondary | ICD-10-CM | POA: Insufficient documentation

## 2011-09-24 DIAGNOSIS — E785 Hyperlipidemia, unspecified: Secondary | ICD-10-CM | POA: Insufficient documentation

## 2011-09-24 DIAGNOSIS — R0609 Other forms of dyspnea: Secondary | ICD-10-CM | POA: Insufficient documentation

## 2011-09-24 DIAGNOSIS — E119 Type 2 diabetes mellitus without complications: Secondary | ICD-10-CM | POA: Insufficient documentation

## 2011-09-24 DIAGNOSIS — R5383 Other fatigue: Secondary | ICD-10-CM

## 2011-09-24 DIAGNOSIS — R0989 Other specified symptoms and signs involving the circulatory and respiratory systems: Secondary | ICD-10-CM | POA: Insufficient documentation

## 2011-09-24 MED ORDER — TECHNETIUM TC 99M TETROFOSMIN IV KIT
30.0000 | PACK | Freq: Once | INTRAVENOUS | Status: AC | PRN
  Administered 2011-09-24: 30 via INTRAVENOUS

## 2011-09-24 MED ORDER — REGADENOSON 0.4 MG/5ML IV SOLN
0.4000 mg | Freq: Once | INTRAVENOUS | Status: AC
  Administered 2011-09-24: 0.4 mg via INTRAVENOUS

## 2011-09-24 MED ORDER — TECHNETIUM TC 99M TETROFOSMIN IV KIT
10.0000 | PACK | Freq: Once | INTRAVENOUS | Status: AC | PRN
  Administered 2011-09-24: 10 via INTRAVENOUS

## 2011-09-24 NOTE — Progress Notes (Signed)
[facility] SITE 3 NUCLEAR MED [address] [place] [address] [phone]  Cardiology Nuclear Med Study  Catherine Cooper is a 70 y.o. female [id number] 1941/08/17   Nuclear Med Background Indication for Stress Test:  Evaluation for Ischemia History:  No previous documented CAD Cardiac Risk Factors: Family History - CAD, Hypertension, Lipids and NIDDM  Symptoms:  DOE, Fatigue and SOB   Nuclear Pre-Procedure Caffeine/Decaff Intake:  None NPO After: 630 pm   Lungs:  clear IV 0.9% NS with Angio Cath:  22g  IV Site: R Hand  IV Started by:  Bonnita Levan, RN  Chest Size (in):  36 Cup Size: B  Height: 5\' 5"  (1.651 m)  Weight:  169 lb (76.658 kg)  BMI:  Body mass index is 28.12 kg/(m^2). Tech Comments:  Held Diabetic Meds this AM    Nuclear Med Study 1 or 2 day study: 1 day  Stress Test Type:  Treadmill/Lexiscan  Reading MD: Kristeen Miss, MD  Order Authorizing Provider:  Rollene Rotunda, MD  Resting Radionuclide: Technetium 71m Tetrofosmin  Resting Radionuclide Dose: 11 mCi   Stress Radionuclide:  Technetium 52m Tetrofosmin  Stress Radionuclide Dose: 33 mCi           Stress Protocol Rest HR: 74 Stress HR: 115  Rest BP: 132/74 Stress BP: 123/33  Exercise Time (min): 2 mins  METS: 1.40   Predicted Max HR: 149 bpm % Max HR: 77.18 bpm Rate Pressure Product: 08657   Dose of Adenosine (mg):  n/a Dose of Lexiscan: 0.4 mg  Dose of Atropine (mg): n/a Dose of Dobutamine: n/a mcg/kg/min (at max HR)  Stress Test Technologist: Frederick Peers, EMT-P  Nuclear Technologist:  Domenic Polite, CNMT     Rest Procedure:  Myocardial perfusion imaging was performed at rest 45 minutes following the intravenous administration of Technetium 48m Tetrofosmin. Rest ECG: NSR  Stress Procedure:  The patient received IV Lexiscan 0.4 mg over 15-seconds with concurrent low level exercise and then Technetium 34m Tetrofosmin was injected at 30-seconds while the patient continued  walking one more minute.  There were non specific st changes with Lexiscan.  Quantitative spect images were obtained after a 45-minute delay. Stress ECG: No significant change from baseline ECG  QPS Raw Data Images:  There is a breast shadow that accounts for the anterior attenuation. Stress Images:  Mildly decreased uptake in the mid to distal anterior wall. Rest Images:  Normal homogeneous uptake in all areas of the myocardium. Subtraction (SDS):  Mildly reversible defect in the mid to distal anterior wall suspect due to breast attenuation. Transient Ischemic Dilatation (Normal <1.22):  .90 Lung/Heart Ratio (Normal <0.45):  .28  Quantitative Gated Spect Images QGS EDV:  58 ml QGS ESV:  17 ml QGS cine images:  NL LV Function; NL Wall Motion QGS EF: 71%  Impression Exercise Capacity:  Lexiscan with no exercise. BP Response:  n/a Clinical Symptoms:  n/a ECG Impression:  No significant ECG changes with Lexiscan. Comparison with Prior Nuclear Study: No previous nuclear study performed  Overall Impression:  Probably normal stress nuclear study. There is a mildly reversible defect in the mid to distal anterior wall likely due to breast attenuation.    [name] 6:52 PM

## 2011-09-26 ENCOUNTER — Encounter: Payer: Self-pay | Admitting: Cardiology

## 2011-10-14 HISTORY — PX: COLONOSCOPY: SHX174

## 2011-10-23 NOTE — Patient Instructions (Signed)
Pt is aware of results and they were forwarded to Dr Christell Constant.

## 2011-12-04 ENCOUNTER — Ambulatory Visit: Payer: Medicare Other | Attending: Gynecologic Oncology | Admitting: Gynecologic Oncology

## 2011-12-04 ENCOUNTER — Encounter: Payer: Self-pay | Admitting: Gynecologic Oncology

## 2011-12-04 VITALS — BP 128/58 | HR 78 | Temp 98.0°F | Resp 16 | Ht 65.0 in | Wt 167.9 lb

## 2011-12-04 DIAGNOSIS — Z880 Allergy status to penicillin: Secondary | ICD-10-CM | POA: Insufficient documentation

## 2011-12-04 DIAGNOSIS — H269 Unspecified cataract: Secondary | ICD-10-CM | POA: Insufficient documentation

## 2011-12-04 DIAGNOSIS — Z818 Family history of other mental and behavioral disorders: Secondary | ICD-10-CM | POA: Insufficient documentation

## 2011-12-04 DIAGNOSIS — E1149 Type 2 diabetes mellitus with other diabetic neurological complication: Secondary | ICD-10-CM | POA: Insufficient documentation

## 2011-12-04 DIAGNOSIS — E785 Hyperlipidemia, unspecified: Secondary | ICD-10-CM | POA: Insufficient documentation

## 2011-12-04 DIAGNOSIS — IMO0002 Reserved for concepts with insufficient information to code with codable children: Secondary | ICD-10-CM

## 2011-12-04 DIAGNOSIS — Z833 Family history of diabetes mellitus: Secondary | ICD-10-CM | POA: Insufficient documentation

## 2011-12-04 DIAGNOSIS — H409 Unspecified glaucoma: Secondary | ICD-10-CM | POA: Insufficient documentation

## 2011-12-04 DIAGNOSIS — F411 Generalized anxiety disorder: Secondary | ICD-10-CM | POA: Insufficient documentation

## 2011-12-04 DIAGNOSIS — Z7982 Long term (current) use of aspirin: Secondary | ICD-10-CM | POA: Insufficient documentation

## 2011-12-04 DIAGNOSIS — Z8542 Personal history of malignant neoplasm of other parts of uterus: Secondary | ICD-10-CM | POA: Insufficient documentation

## 2011-12-04 DIAGNOSIS — C549 Malignant neoplasm of corpus uteri, unspecified: Secondary | ICD-10-CM | POA: Insufficient documentation

## 2011-12-04 DIAGNOSIS — K219 Gastro-esophageal reflux disease without esophagitis: Secondary | ICD-10-CM | POA: Insufficient documentation

## 2011-12-04 DIAGNOSIS — C541 Malignant neoplasm of endometrium: Secondary | ICD-10-CM

## 2011-12-04 DIAGNOSIS — Z8249 Family history of ischemic heart disease and other diseases of the circulatory system: Secondary | ICD-10-CM | POA: Insufficient documentation

## 2011-12-04 DIAGNOSIS — Z90710 Acquired absence of both cervix and uterus: Secondary | ICD-10-CM | POA: Insufficient documentation

## 2011-12-04 DIAGNOSIS — M810 Age-related osteoporosis without current pathological fracture: Secondary | ICD-10-CM | POA: Insufficient documentation

## 2011-12-04 NOTE — Progress Notes (Signed)
Consult Note: Gyn-Onc  [name] 71 y.o. female  CC:  Chief Complaint  Patient presents with  . Endometrial Cancer    Follow up    [id number]: Ms. Gerber is a very pleasant 71 year old with history of a  stage IA, grade 1 endometrioid adenocarcinoma who underwent robotic hysterectomy, BSO, and bilateral pelvic lymph node dissection in March  2011. Final pathology revealed a grade 1 endometrioid adenocarcinoma  with 10% myometrial invasion, negative lymph nodes, negative adnexa, no  lymphovascular space involvement, and negative washings. I last saw her  in August 2012, at which time her exam was unremarkable. Her last Pap  smear was at that time and it was negative. She comes in today for  followup.    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 She has some swelling of her left medial malleolus and some pain on the top of her foot.  She is able to walk. She does not really have any pain. She occasionally has what she describes as a "rolling" pain across the epigastric region and has been told by her primary physician that this may be gas and stool crossing through the transverse colon. It is fleeting, it goes  away, and she does believe that that is correct. She does have some occasional shortness of breath, but she feels it is due to her becoming deconditioned, as her activity level has been very low.  She does have a cough.  With regard to the cough, the workup has essentially been negative. They were thinking that it was related to gastroesophageal reflux disease, but that evaluation has been negative. She's also been treated with allergy medicine in case this is related to allergies. Allergy medicines to help for a while but then it recurs. She has had several teeth pulled and has a plate in and a partial which  she is trying to get use to. She denies any other change in bowel or bladder habits, any nausea, vomiting, fever, chills, headaches, visual changes, unintentional weight loss/weight gain. 10-point review of  systems is otherwise negative.   Current Meds:  Outpatient Encounter Prescriptions as of 12/04/2011  Medication Sig Dispense Refill  . alendronate (FOSAMAX) 70 MG tablet Take 70 mg by mouth every 7 (seven) days. Take with a full glass of water on an empty stomach.       Marland Kitchen aspirin 81 MG tablet Take 81 mg by mouth daily.        Marland Kitchen atorvastatin (LIPITOR) 20 MG tablet Take 20 mg by mouth daily.        . brimonidine (ALPHAGAN P) 0.1 % SOLN Place 1 drop into both eyes 2 (two) times daily.        . Cholecalciferol (VITAMIN D3) 2000 UNITS TABS Take by mouth.        . dexlansoprazole (DEXILANT) 60 MG capsule Take 60 mg by mouth daily.        Marland Kitchen glipiZIDE (GLUCOTROL) 5 MG 24 hr tablet Take 10 mg by mouth daily.       Marland Kitchen ketotifen (ALAWAY) 0.025 % ophthalmic solution 1 drop 2 (two) times daily.        Marland Kitchen L-Methylfolate-B6-B12 (METANX) 3-35-2 MG TABS Take 1 tablet by mouth daily.       Marland Kitchen loperamide (IMODIUM A-D) 2  MG tablet Take 2 mg by mouth 4 (four) times daily as needed.        . Multiple Vitamin (MULTI VITAMIN MENS PO) Take by mouth.        . saxagliptin HCl (ONGLYZA) 5 MG TABS tablet Take 5 mg by mouth daily.        . vitamin B-12 (CYANOCOBALAMIN) 1000 MCG tablet Take 1,000 mcg by mouth daily.          Allergy:  Allergies  Allergen Reactions  . Penicillins Rash    Social Hx:   History   Social History  . Marital Status: Married    Spouse Name: N/A    Number of Children: 3  . Years of Education: N/A   Occupational History  . retired     [employer]   Social History Main Topics  . Smoking status: Never Smoker   . Smokeless tobacco: Not on file  . Alcohol Use: No  . Drug Use: No  . Sexually Active: Not on file   Other Topics Concern  . Not on file   Social  History Narrative   2 healthy daughters and 1 son deceased (suicide @ 30)    Past Surgical Hx:  Past Surgical History  Procedure Date  .  vitrectomy 02/23/08    posterior; right eye  . Tubal ligation   . Abdominal hysterectomy   . Foot surgery     Right     Past Medical Hx:  Past Medical History  Diagnosis Date  . Diabetes mellitus     type II  . Osteoporosis   . Glaucoma   . GAD (generalized anxiety disorder)   . GERD (gastroesophageal reflux disease)   . Hyperlipidemia   . Endometrioid adenocarcinoma   . 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 Shingles 2004  . Nephrolithiasis   . Cataracts, bilateral     Family Hx:  Family History  Problem Relation Age of Onset  . Dementia Father   . Diabetes Father   . Coronary artery disease Father 41  . Atrial fibrillation Mother     Vitals:  Blood pressure 128/58, pulse 78, temperature 98 F (36.7 C), resp. rate 16, height 5\' 5"  (1.651 m), weight 167 lb 14.4 oz (76.159 kg).  Physical Exam:  GENERAL: A well-nourished, well-developed female, in no acute distress.  NECK: Supple. There is no lymphadenopathy, no thyromegaly.  LUNGS: Clear to auscultation bilaterally.  CARDIOVASCULAR: Regular rate and rhythm.  ABDOMEN: Soft, nontender, nondistended. There are no palpable masses or hepatosplenomegaly. There are no incisional hernias. Groins are negative for adenopathy.  EXTREMITIES: There is trace edema, equal bilaterally.  PELVIC: External genitalia is within normal limits, though atrophic. The vagina is markedly atrophic. The vaginal cuff is without lesions. Bimanual examination reveals no masses or nodularity.  RECTAL: Confirms.  Assessment/Plan: ASSESSMENT: A 71 year old with stage IA, grade 1 endometrioid adenocarcinoma who clinically has no evidence of recurrent disease. She is almost 2 years out from her diagnosis. PLAN: We will follow up on the results for her Pap smear from today.  She will return to see [place] in 6 months. She will follow up with her other physicians as scheduled.  Lenoir Facchini A., MD 12/04/2011, 8:34 AM

## 2011-12-04 NOTE — Patient Instructions (Signed)
Return to clinic in 6 months.

## 2012-06-10 ENCOUNTER — Ambulatory Visit: Payer: Medicare Other | Attending: Gynecologic Oncology | Admitting: Gynecologic Oncology

## 2012-06-10 ENCOUNTER — Other Ambulatory Visit (HOSPITAL_COMMUNITY)
Admission: RE | Admit: 2012-06-10 | Discharge: 2012-06-10 | Disposition: A | Discharge status: Home / Self Care | Payer: Medicare Other | Source: Ambulatory Visit | Attending: Gynecologic Oncology | Admitting: Gynecologic Oncology

## 2012-06-10 ENCOUNTER — Encounter: Payer: Self-pay | Admitting: Gynecologic Oncology

## 2012-06-10 VITALS — BP 110/56 | HR 80 | Temp 98.7°F | Resp 16 | Ht 65.0 in | Wt 165.2 lb

## 2012-06-10 DIAGNOSIS — C549 Malignant neoplasm of corpus uteri, unspecified: Secondary | ICD-10-CM | POA: Insufficient documentation

## 2012-06-10 DIAGNOSIS — Z79899 Other long term (current) drug therapy: Secondary | ICD-10-CM | POA: Insufficient documentation

## 2012-06-10 DIAGNOSIS — Z124 Encounter for screening for malignant neoplasm of cervix: Secondary | ICD-10-CM | POA: Insufficient documentation

## 2012-06-10 DIAGNOSIS — K219 Gastro-esophageal reflux disease without esophagitis: Secondary | ICD-10-CM | POA: Insufficient documentation

## 2012-06-10 DIAGNOSIS — IMO0002 Reserved for concepts with insufficient information to code with codable children: Secondary | ICD-10-CM

## 2012-06-10 DIAGNOSIS — E785 Hyperlipidemia, unspecified: Secondary | ICD-10-CM | POA: Insufficient documentation

## 2012-06-10 DIAGNOSIS — M810 Age-related osteoporosis without current pathological fracture: Secondary | ICD-10-CM | POA: Insufficient documentation

## 2012-06-10 DIAGNOSIS — E119 Type 2 diabetes mellitus without complications: Secondary | ICD-10-CM | POA: Insufficient documentation

## 2012-06-10 NOTE — Patient Instructions (Signed)
RTC 6 months

## 2012-06-10 NOTE — Progress Notes (Signed)
Consult Note: Gyn-Onc  Catherine Cooper 71 y.o. female  CC:  Chief Complaint  Patient presents with  . Endometrioid Adenocarcinoma    Follow up    HPI: HISTORY: Catherine Cooper is a very pleasant 71 year old with history of a stage IA, grade 1 endometrioid adenocarcinoma who underwent robotic hysterectomy, BSO, and bilateral pelvic lymph node dissection in March 2011. Final pathology revealed a grade 1 endometrioid adenocarcinoma with 10% myometrial invasion, negative lymph nodes, negative adnexa, no lymphovascular space involvement, and negative washings. I last saw her  in 11/2011, at which time her exam was unremarkable. Her last Pap smear 8/12 was negative. She comes in today for followup.   Interval History:  Her boot is off.  She has been diagnosed with anemia and is on iron supplements.  She has a colonoscopy scheduled for 10/13 and her mammogram. She has a viral URI.  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 She occasionally has what she describes as a "rolling" pain across the epigastric region and has been told by her primary physician that this may be gas and stool crossing through the transverse colon. It is fleeting, it goes away, and she does believe that that it is gas pain.  She hopes that her colonoscopy will not show anything worrisome. She does have some occasional shortness of breath, but she feels it is due to her becoming deconditioned, as her activity level has been very low. She does have a cough. With regard to the cough, the workup has essentially been negative. They were thinking that it was related to gastroesophageal reflux disease, but that evaluation has been negative. She's also been treated with allergy medicine in case this is related to allergies. Allergy medicines to help for a while but then it recurs.She denies any other change in bowel or bladder habits, any nausea, vomiting, fever,  chills, headaches, visual changes, unintentional weight loss/weight gain. 10-point review of systems is otherwise negative. No new medical issues.   Current Meds:  Outpatient Encounter Prescriptions as of 06/10/2012  Medication Sig Dispense Refill  . alendronate (FOSAMAX) 70 MG tablet Take 70 mg by mouth every 7 (seven) days. Take with a full glass of water on an empty stomach.       Marland Kitchen aspirin 81 MG tablet Take 81 mg by mouth daily.        Marland Kitchen atorvastatin (LIPITOR) 20 MG tablet Take 20 mg by mouth daily.        . brimonidine (ALPHAGAN P) 0.1 % SOLN Place 1 drop into both eyes 2 (two) times daily.        . Cholecalciferol (VITAMIN D3) 2000 UNITS TABS Take by mouth.        . dexlansoprazole (DEXILANT) 60 MG capsule Take 60 mg by mouth daily.        . ferrous sulfate 325 (65 FE) MG tablet Take 325 mg by mouth daily with breakfast.      . glipiZIDE (GLUCOTROL) 5 MG 24 hr tablet Take 10 mg by mouth daily.       Marland Kitchen ketotifen (ALAWAY) 0.025 % ophthalmic solution 1 drop 2 (two) times daily.        Marland Kitchen L-Methylfolate-B6-B12 (METANX) 3-35-2 MG TABS Take 1 tablet by mouth daily.       Marland Kitchen loperamide (IMODIUM A-D) 2 MG tablet Take 2 mg by mouth 4 (four) times daily as needed.        Marland Kitchen  Multiple Vitamin (MULTI VITAMIN MENS PO) Take by mouth.        . saxagliptin HCl (ONGLYZA) 5 MG TABS tablet Take 5 mg by mouth daily.        . vitamin B-12 (CYANOCOBALAMIN) 1000 MCG tablet Take 1,000 mcg by mouth daily.          Allergy:  Allergies  Allergen Reactions  . Penicillins Rash    Social Hx:   History   Social History  . Marital Status: Married    Spouse Name: N/A    Number of Children: 3  . Years of Education: N/A   Occupational History  . retired     [employer]   Social History Main Topics  . Smoking status: Never Smoker   . Smokeless tobacco: Not on file  . Alcohol Use: No  . Drug Use: No  . Sexually Active: Not on file   Other Topics Concern  . Not on file   Social  History Narrative   2 healthy daughters and 1 son deceased (suicide @ 9)    Past Surgical Hx:  Past Surgical History  Procedure Date  .  vitrectomy 02/23/08    posterior; right eye  . Tubal ligation   . Abdominal hysterectomy   . Foot surgery     Right     Past Medical Hx:  Past Medical History  Diagnosis Date  . Diabetes mellitus     type II  . Osteoporosis   . Glaucoma   . GAD (generalized anxiety disorder)   . GERD (gastroesophageal reflux disease)   . Hyperlipidemia   . Endometrioid adenocarcinoma   . 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 Shingles 2004  . Nephrolithiasis   . Cataracts, bilateral     Family Hx:  Family History  Problem Relation Age of Onset  . Dementia Father   . Diabetes Father   . Coronary artery disease Father 27  . Atrial fibrillation Mother     Vitals:  Blood pressure 110/56, pulse 80, temperature 98.7 F (37.1 C), temperature source Oral, resp. rate 16, height 5\' 5"  (1.651 m), weight 165 lb 3.2 oz (74.934 kg).  Physical Exam: GENERAL: A well-nourished, well-developed female, in no acute distress.  NECK: Supple. There is no lymphadenopathy, no thyromegaly.  LUNGS: Clear to auscultation bilaterally.  CARDIOVASCULAR: Regular rate and rhythm.  ABDOMEN: Soft, nontender, nondistended. There are no palpable masses or hepatosplenomegaly. There are no incisional hernias. Groins are negative for adenopathy.  EXTREMITIES: There is trace edema, equal bilaterally.  PELVIC: External genitalia is within normal limits, though atrophic. The vagina is markedly atrophic. The vaginal cuff is without lesions. Pap smear submitted without difficulty. Bimanual examination reveals no masses or nodularity.  RECTAL: Confirms.   Assessment/Plan:  ASSESSMENT: A 71 year old with stage IA, grade 1 endometrioid adenocarcinoma who clinically has no evidence of recurrent disease. She is > 2 years out from her diagnosis.  PLAN:  We will follow up on the results for her Pap smear from today. She will return to see [place] in 6 months. She will follow up with her other physicians as scheduled.      Madelynne Lasker A., MD 06/10/2012, 10:01 AM

## 2012-06-18 ENCOUNTER — Telehealth: Payer: Self-pay | Admitting: Gynecologic Oncology

## 2012-06-18 NOTE — Telephone Encounter (Signed)
Pt notified about pap results: negative.  No questions or concerns voiced. 

## 2012-11-17 ENCOUNTER — Encounter: Payer: Self-pay | Admitting: Gynecologic Oncology

## 2012-11-17 ENCOUNTER — Ambulatory Visit: Payer: Medicare Other | Attending: Gynecology | Admitting: Gynecologic Oncology

## 2012-11-17 VITALS — BP 102/60 | HR 64 | Temp 98.6°F | Resp 16 | Ht 65.75 in | Wt 167.7 lb

## 2012-11-17 DIAGNOSIS — C541 Malignant neoplasm of endometrium: Secondary | ICD-10-CM

## 2012-11-17 DIAGNOSIS — C549 Malignant neoplasm of corpus uteri, unspecified: Secondary | ICD-10-CM | POA: Insufficient documentation

## 2012-11-17 NOTE — Progress Notes (Signed)
Follow Up Note: Gyn-Onc  [name] 72 y.o. female  CC:  Chief Complaint  Patient presents with  . Endometrial adenocarcinoma    Follow up   HPI:  Catherine Cooper is a 72 year old female with a history of stage IA, grade 1 endometrioid adenocarcinoma.  She underwent a robotic hysterectomy, BSO, and bilateral pelvic lymph node dissection in March 2011.  Final pathology revealed a grade 1 endometrioid adenocarcinoma with 10% myometrial invasion, negative lymph nodes, negative adnexa, no lymphovascular space involvement, and negative washings.  She last saw Dr. Duard Brady in August of 2013 with an unremarkable exam.  Her pap smear at that time was negative for malignancy.   Interval History:  Catherine Cooper presents today for continued follow up.  She voices no complaints since her last visit.  She reports that she is finishing up with her iron supplementation for a recent diagnosis of anemia and she has plans to have lab work next week to follow up with her primary care provider.  She reports improvement with her left foot after being diagnosed with Charcot arthropathy in the fall of 2013.  She continues to report having intermittent gas-like cramps in her upper abdomen that resolve after a few seconds.  She reports improvement in these symptoms with taking Dexilant.  She continues to have a dry cough with no sputum production.  The cause has been investigated per the patient with no abnormal findings reported.  She states that allergy medication, like Zyrtec or Claritin, does not help with the cough.  She describes it as a "tickle" in her throat that catches her and makes her begin coughing.    Review of Systems Constitutional: Feels well.  No fever, chills, unintentional weight gain or loss, headaches, or visual changes.  Cardiovascular: No chest pain, shortness of breath, or edema.  Pulmonary: Continued dry cough with no  sputum production.  No wheeze.  Gastrointestinal: No nausea, vomiting, or diarrhea. No bright red blood per rectum or change in bowel movement.  Genitourinary: No frequency, urgency, or dysuria. No vaginal bleeding or discharge.  Musculoskeletal: No myalgia or joint pain. Neurologic: No weakness, numbness, or change in gait.  Psychology: No depression, anxiety, or insomnia.  Health Maintenance: Mammogram: Up to date Pap Smear: 05/2012 Colonoscopy: 2013  Current Meds:  Outpatient Encounter Prescriptions as of 11/17/2012  Medication Sig Dispense Refill  . alendronate (FOSAMAX) 70 MG tablet Take 70 mg by mouth every 7 (seven) days. Take with a full glass of water on an empty stomach.       Marland Kitchen aspirin 81 MG tablet Take 81 mg by mouth daily.        Marland Kitchen atorvastatin (LIPITOR) 20 MG tablet Take 20 mg by mouth daily.        . brimonidine (ALPHAGAN P) 0.1 % SOLN Place 1 drop into both eyes 2 (two) times daily.        . Cholecalciferol (VITAMIN D3) 2000 UNITS TABS Take by mouth.        . dexlansoprazole (DEXILANT) 60 MG capsule Take 60 mg by mouth daily.        . ferrous gluconate (FERGON) 325 MG tablet Take 325 mg by mouth at bedtime.      . ferrous sulfate 325 (65 FE) MG tablet Take 325 mg by mouth daily with breakfast.      . glipiZIDE (GLUCOTROL) 5 MG 24 hr tablet Take 10 mg by mouth daily.       Marland Kitchen ketotifen (ALAWAY) 0.025 %  ophthalmic solution 1 drop 2 (two) times daily.        Marland Kitchen L-Methylfolate-B6-B12 (METANX) 3-35-2 MG TABS Take 1 tablet by mouth daily.       Marland Kitchen loperamide (IMODIUM A-D) 2 MG tablet Take 2 mg by mouth 4 (four) times daily as needed.        . Multiple Vitamin (MULTI VITAMIN MENS PO) Take by mouth.        . saxagliptin HCl (ONGLYZA) 5 MG TABS tablet Take 5 mg by mouth daily.        . vitamin B-12 (CYANOCOBALAMIN) 1000 MCG tablet Take 1,000 mcg by mouth daily.          Allergy:  Allergies  Allergen Reactions  . Penicillins Rash    Social Hx:   History   Social History  .  Marital Status: Married    Spouse Name: N/A    Number of Children: 3  . Years of Education: N/A   Occupational History  . retired     [employer]   Social History Main Topics  . Smoking status: Never Smoker   . Smokeless tobacco: Not on file  . Alcohol Use: No  . Drug Use: No  . Sexually Active: Not on file   Other Topics Concern  . Not on file   Social History Narrative   2 healthy daughters and 1 son deceased (suicide @ 68)    Past Surgical Hx:  Past Surgical History  Procedure Date  .  vitrectomy 02/23/08    posterior; right eye  . Tubal ligation   . Abdominal hysterectomy   . Foot surgery     Right     Past Medical Hx:  Past Medical History  Diagnosis Date  . Diabetes mellitus     type II  . Osteoporosis   . Glaucoma(365)   . GAD (generalized anxiety disorder)   . GERD (gastroesophageal reflux disease)   . Hyperlipidemia   . Endometrioid adenocarcinoma   . 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 Shingles 2004  . Nephrolithiasis   . Cataracts, bilateral     Family Hx:  Family History  Problem Relation Age of Onset  . Dementia Father   . Diabetes Father   . Coronary artery disease Father 66  . Atrial fibrillation Mother     Vitals:  Blood pressure 102/60, pulse 64, temperature 98.6 F (37 C), temperature source Oral, resp. rate 16, height 5' 5.75" (1.67 m), weight 167 lb 11.2 oz (76.068 kg).  Physical Exam:  General: Well developed, well nourished female in no acute distress. Alert and oriented x 3.  Head/ Neck: Supple without any enlargements.  Oropharynx clear without lesions.  Sclerae anicteric.  Lymph node survey: No cervical, supraclavicular, or inguinal adenopathy  Cardiovascular: Regular rate and rhythm. S1 and S2 normal.  Lungs: Clear to auscultation bilaterally. No wheezes/crackles/rhonchi noted.  Skin: No rashes or lesions present. Back: No CVA tenderness.  Abdomen: Abdomen  soft, non-tender and obese. Active bowel sounds in all quadrants. No evidence of a fluid wave or abdominal masses.  Genitourinary:    Vulva/vagina: Normal external female genitalia. No lesions.    Urethra: No lesions or masses.    Vagina: Atrophic without any lesions. No palpable masses. No vaginal bleeding or drainage noted.  Rectal: Good tone, no masses, no cul de sac nodularity.  Extremities: No bilateral cyanosis, edema, or clubbing.    Assessment/Plan:  Catherine Graff  Cooper is a 72 year old with stage IA, grade 1 endometrioid adenocarcinoma who clinically has no evidence of recurrent disease. She is > 2 years out from her diagnosis.  She will return to see [place] in 6 months with a pap smear obtained at that time. She will follow up with her other physicians as scheduled.  She is instructed to please call the office for any questions or concerns.   Survivorship packet reviewed with reportable signs and symptoms, recommended preventative screenings per the Society of Gynecologic Oncology, and support services offered through the Cancer Center.     CROSS, MELISSA DEAL, NP 11/17/2012, 10:32 AM

## 2012-11-17 NOTE — Patient Instructions (Signed)
Plan to follow up in 6 months.   Please review your survivorship packet with reportable signs and symptoms, recommended preventative screenings per the Society of Gynecologic Oncology, and support services offered through the Cancer Center.  Please call the office for any questions or concerns.  Thank you for coming to see me today.  I appreciate your confidence in choosing [facility] for your medical care.  If you have any questions about your visit today, please call our office and we will get back to you as soon as possible.  Warner Mccreedy, NP Gynecologic Oncology

## 2013-01-10 ENCOUNTER — Ambulatory Visit (INDEPENDENT_AMBULATORY_CARE_PROVIDER_SITE_OTHER): Payer: Medicare Other | Admitting: Nurse Practitioner

## 2013-01-10 ENCOUNTER — Telehealth: Payer: Self-pay | Admitting: Nurse Practitioner

## 2013-01-10 ENCOUNTER — Encounter: Payer: Self-pay | Admitting: Nurse Practitioner

## 2013-01-10 VITALS — BP 102/56 | HR 90 | Temp 97.9°F | Ht 65.0 in | Wt 168.0 lb

## 2013-01-10 DIAGNOSIS — M545 Low back pain: Secondary | ICD-10-CM

## 2013-01-10 DIAGNOSIS — J209 Acute bronchitis, unspecified: Secondary | ICD-10-CM

## 2013-01-10 MED ORDER — METHYLPREDNISOLONE ACETATE 80 MG/ML IJ SUSP
80.0000 mg | Freq: Once | INTRAMUSCULAR | Status: AC
  Administered 2013-01-10: 80 mg via INTRAMUSCULAR

## 2013-01-10 MED ORDER — HYDROCODONE-HOMATROPINE 5-1.5 MG/5ML PO SYRP
5.0000 mL | ORAL_SOLUTION | Freq: Three times a day (TID) | ORAL | Status: DC | PRN
Start: 2013-01-10 — End: 2013-03-21

## 2013-01-10 MED ORDER — IBUPROFEN 800 MG PO TABS: 800.0000 mg | ORAL_TABLET | Freq: Four times a day (QID) | ORAL | Status: DC | PRN

## 2013-01-10 NOTE — Patient Instructions (Signed)

## 2013-01-10 NOTE — Telephone Encounter (Signed)
APPT MADE

## 2013-01-10 NOTE — Progress Notes (Signed)
  Subjective:    Patient ID: Catherine Cooper, female    DOB: Jun 20, 1941, 72 y.o.   MRN: [id number]  HPI Patient in complaining of Cough . Started 3day. Has gotten unchanged since started. Associated symptoms include runny nose and lots of phlegm. He has tried dayquil and nyquil and mucinex OTC without relief. Patient has a chronic cough but sh esays this cough is different.  Patient in complaining of pain in lower back. Started 3dayago. intermittent. Rates pain 5-6/10. Motrin Helps pain. Walking or going up steps increases pain. Pain radiating all the way down right leg.    Review of Systems  Constitutional: Negative.   HENT: Positive for rhinorrhea and postnasal drip. Negative for congestion, sneezing, sinus pressure and ear discharge.   Eyes: Negative.   Respiratory: Positive for cough. Negative for choking, chest tightness, shortness of breath and wheezing.   Cardiovascular: Negative.   Gastrointestinal: Negative.   Musculoskeletal: Positive for back pain. Negative for gait problem.  Psychiatric/Behavioral: Negative.        Objective:   Physical Exam  Constitutional: She is oriented to person, place, and time. She appears well-developed and well-nourished.  Eyes: EOM are normal.  Neck: Normal range of motion. Neck supple.  Cardiovascular: Normal rate, regular rhythm, normal heart sounds and intact distal pulses.   No murmur heard. Pulmonary/Chest: Effort normal. No respiratory distress. She has no wheezes. She has no rales.  Tight cough  Abdominal: Soft. Bowel sounds are normal.  Musculoskeletal: Normal range of motion.       Lumbar back: She exhibits normal range of motion, no tenderness and no swelling.  Negative SLR bil  Lymphadenopathy:    She has no cervical adenopathy.  Neurological: She is alert and oriented to person, place, and time.  Skin: Skin is warm and dry.  Psychiatric: She has a normal mood and affect. Her behavior is normal. Judgment and thought content  normal.    BP 102/56  Pulse 90  Temp(Src) 97.9 F (36.6 C) (Oral)  Ht 5\' 5"  (1.651 m)  Wt 168 lb (76.204 kg)  BMI 27.96 kg/m2       Assessment & Plan:  1. Low back pain radiating to right leg Moist heat No bending or stooping - methylPREDNISolone acetate (DEPO-MEDROL) injection 80 mg; Inject 1 mL (80 mg total) into the muscle once. - ibuprofen (ADVIL,MOTRIN) 800 MG tablet; Take 1 tablet (800 mg total) by mouth every 6 (six) hours as needed for pain.  Dispense: 30 tablet; Refill: 0  2. Acute bronchitis Force fluids rest - methylPREDNISolone acetate (DEPO-MEDROL) injection 80 mg; Inject 1 mL (80 mg total) into the muscle once. - HYDROcodone-homatropine (HYCODAN) 5-1.5 MG/5ML syrup; Take 5 mLs by mouth every 8 (eight) hours as needed for cough.  Dispense: 120 mL; Refill: 0  RTO if no improvement or other symptoms develop  Mary-Margaret Daphine Deutscher, FNP

## 2013-03-21 ENCOUNTER — Encounter: Payer: Self-pay | Admitting: Nurse Practitioner

## 2013-03-21 ENCOUNTER — Ambulatory Visit (INDEPENDENT_AMBULATORY_CARE_PROVIDER_SITE_OTHER): Payer: Medicare Other | Admitting: Nurse Practitioner

## 2013-03-21 VITALS — BP 95/56 | HR 54 | Temp 98.2°F | Ht 65.0 in | Wt 164.0 lb

## 2013-03-21 DIAGNOSIS — E785 Hyperlipidemia, unspecified: Secondary | ICD-10-CM | POA: Insufficient documentation

## 2013-03-21 DIAGNOSIS — E119 Type 2 diabetes mellitus without complications: Secondary | ICD-10-CM

## 2013-03-21 DIAGNOSIS — I10 Essential (primary) hypertension: Secondary | ICD-10-CM

## 2013-03-21 DIAGNOSIS — F411 Generalized anxiety disorder: Secondary | ICD-10-CM

## 2013-03-21 DIAGNOSIS — M810 Age-related osteoporosis without current pathological fracture: Secondary | ICD-10-CM

## 2013-03-21 DIAGNOSIS — K219 Gastro-esophageal reflux disease without esophagitis: Secondary | ICD-10-CM

## 2013-03-21 DIAGNOSIS — E1169 Type 2 diabetes mellitus with other specified complication: Secondary | ICD-10-CM | POA: Insufficient documentation

## 2013-03-21 LAB — COMPLETE METABOLIC PANEL WITH GFR
Albumin: 4 g/dL (ref 3.5–5.2)
BUN: 11 mg/dL (ref 6–23)
CO2: 29 mEq/L (ref 19–32)
Chloride: 105 mEq/L (ref 96–112)
Creat: 0.96 mg/dL (ref 0.50–1.10)
Glucose, Bld: 146 mg/dL — ABNORMAL HIGH (ref 70–99)
Total Protein: 6.5 g/dL (ref 6.0–8.3)

## 2013-03-21 LAB — POCT GLYCOSYLATED HEMOGLOBIN (HGB A1C): Hemoglobin A1C: 6.5

## 2013-03-21 MED ORDER — DEXLANSOPRAZOLE 60 MG PO CPDR: 60.0000 mg | DELAYED_RELEASE_CAPSULE | Freq: Every day | ORAL | Status: DC

## 2013-03-21 MED ORDER — ALENDRONATE SODIUM 70 MG PO TABS: 70.0000 mg | ORAL_TABLET | ORAL | Status: DC

## 2013-03-21 MED ORDER — LORAZEPAM 0.5 MG PO TABS: 0.5000 mg | ORAL_TABLET | Freq: Two times a day (BID) | ORAL | Status: DC | PRN

## 2013-03-21 MED ORDER — SAXAGLIPTIN HCL 5 MG PO TABS: 5.0000 mg | ORAL_TABLET | Freq: Every day | ORAL | Status: DC

## 2013-03-21 MED ORDER — GLIPIZIDE ER 5 MG PO TB24: ORAL_TABLET | ORAL | Status: DC

## 2013-03-21 NOTE — Patient Instructions (Signed)

## 2013-03-21 NOTE — Progress Notes (Addendum)
Subjective:    Patient ID: Catherine Cooper, female    DOB: Apr 13, 1941, 72 y.o.   MRN: [id number]  Hypertension This is a chronic problem. The current episode started more than 1 year ago. The problem has been resolved since onset. The problem is controlled. Pertinent negatives include no blurred vision, chest pain, headaches, orthopnea, palpitations, peripheral edema, shortness of breath or sweats. There are no associated agents to hypertension. Risk factors for coronary artery disease include diabetes mellitus, dyslipidemia, obesity and post-menopausal state. Past treatments include nothing. The current treatment provides significant improvement. Compliance problems include diet and exercise.   Hyperlipidemia This is a chronic problem. The current episode started more than 1 year ago. The problem is controlled. Recent lipid tests were reviewed and are normal. There are no known factors aggravating her hyperlipidemia. Associated symptoms include a focal sensory loss, a focal weakness, leg pain and myalgias. Pertinent negatives include no chest pain or shortness of breath. (Patient was on lipitor for 2 years and was just feeling awful.stopped taking 4 weeks ago and now feels 100 X better.) Current antihyperlipidemic treatment includes statins. The current treatment provides significant improvement of lipids. Compliance problems include adherence to diet and adherence to exercise.  Risk factors for coronary artery disease include diabetes mellitus, hypertension and post-menopausal.  Diabetes She presents for her follow-up diabetic visit. She has type 2 diabetes mellitus. MedicAlert identification noted. The initial diagnosis of diabetes was made 8 years ago. Her disease course has been improving. There are no hypoglycemic associated symptoms. Pertinent negatives for hypoglycemia include no headaches or sweats. Pertinent negatives for diabetes include no blurred vision, no chest pain, no polydipsia, no  polyphagia, no polyuria, no weakness and no weight loss. There are no hypoglycemic complications. Symptoms are stable. There are no diabetic complications. Risk factors for coronary artery disease include dyslipidemia, hypertension, post-menopausal and stress. Current diabetic treatment includes oral agent (dual therapy). She is compliant with treatment all of the time. Her weight is stable. She is following a generally healthy and diabetic diet. When asked about meal planning, she reported none. She has not had a previous visit with a dietician. She rarely participates in exercise. Her home blood glucose trend is fluctuating minimally. Her breakfast blood glucose is taken between 8-9 am. Her breakfast blood glucose range is generally 140-180 mg/dl. Her overall blood glucose range is 140-180 mg/dl. An ACE inhibitor/angiotensin II receptor blocker is not being taken. She does not see a podiatrist.Eye exam is current (6 months ago- has appointment next week.).  osteporosis Fosamax weekly without side effects- Very little weight bearing exercises. Gad Only takes alprazolam when she has to go stay with her mother in law!   Review of Systems  Constitutional: Negative for weight loss.  Eyes: Negative for blurred vision.  Respiratory: Negative for shortness of breath.   Cardiovascular: Negative for chest pain, palpitations and orthopnea.  Endocrine: Negative for polydipsia, polyphagia and polyuria.  Musculoskeletal: Positive for myalgias.  Neurological: Positive for focal weakness. Negative for weakness and headaches.  All other systems reviewed and are negative.       Objective:   Physical Exam  Constitutional: She is oriented to person, place, and time. She appears well-developed and well-nourished.  HENT:  Nose: Nose normal.  Mouth/Throat: Oropharynx is clear and moist.  Right cerumen impaction   Eyes: EOM are normal.  Neck: Trachea normal, normal range of motion and full passive range of  motion without pain. Neck supple. No JVD present. Carotid bruit is  not present. No thyromegaly present.  Cardiovascular: Normal rate, regular rhythm, normal heart sounds and intact distal pulses.  Exam reveals no gallop and no friction rub.   No murmur heard. Pulmonary/Chest: Effort normal and breath sounds normal.  Abdominal: Soft. Bowel sounds are normal. She exhibits no distension and no mass. There is no tenderness.  Musculoskeletal: Normal range of motion.  Lymphadenopathy:    She has no cervical adenopathy.  Neurological: She is alert and oriented to person, place, and time. She has normal reflexes.  Skin: Skin is warm and dry.  Psychiatric: She has a normal mood and affect. Her behavior is normal. Judgment and thought content normal.   BP 95/56  Pulse 54  Temp(Src) 98.2 F (36.8 C) (Oral)  Ht 5\' 5"  (1.651 m)  Wt 164 lb (74.39 kg)  BMI 27.29 kg/m2 Results for orders placed in visit on 03/21/13  POCT GLYCOSYLATED HEMOGLOBIN (HGB A1C)      Result Value Range   Hemoglobin A1C 6.5            Assessment & Plan:  1. DM type 2 (diabetes mellitus, type 2) Low carb diet- especially in th eevenings - POCT glycosylated hemoglobin (Hb A1C) - glipiZIDE (GLUCOTROL XL) 5 MG 24 hr tablet; 1 PO QD  Dispense: 90 tablet; Refill: 1 - saxagliptin HCl (ONGLYZA) 5 MG TABS tablet; Take 1 tablet (5 mg total) by mouth daily.  Dispense: 90 tablet; Refill: 1  2. HTN (hypertension) Low NA+ DieT - COMPLETE METABOLIC PANEL WITH GFR  3. Other and unspecified hyperlipidemia *Low fat diet and exercise\Will hold off on cholesterol meds since can't tolerate lipitor - NMR Lipoprofile with Lipids  4. Osteoporosis, unspecified Weight bearing exercise - alendronate (FOSAMAX) 70 MG tablet; Take 1 tablet (70 mg total) by mouth every 7 (seven) days. Take with a full glass of water on an empty stomach.  Dispense: 12 tablet; Refill: 1  5. GERD (gastroesophageal reflux disease) - dexlansoprazole (DEXILANT)  60 MG capsule; Take 1 capsule (60 mg total) by mouth daily.  Dispense: 30 capsule; Refill: 5  6. GAD (generalized anxiety disorder) Stress management - LORazepam (ATIVAN) 0.5 MG tablet; Take 1 tablet (0.5 mg total) by mouth 2 (two) times daily as needed for anxiety.  Dispense: 60 tablet; Refill: 1   Mary-Margaret Daphine Deutscher, FNP

## 2013-03-22 LAB — NMR LIPOPROFILE WITH LIPIDS
HDL Particle Number: 27.4 umol/L — ABNORMAL LOW (ref >=30.5)
HDL Size: 8.9 nm — ABNORMAL LOW (ref >=9.2)
LDL Size: 20.9 nm (ref >20.5)
Large HDL-P: 4.4 umol/L — ABNORMAL LOW (ref >=4.8)
Large VLDL-P: <0.8 nmol/L (ref <=2.7)
Small LDL Particle Number: 466 nmol/L (ref <=527)

## 2013-05-20 ENCOUNTER — Other Ambulatory Visit (INDEPENDENT_AMBULATORY_CARE_PROVIDER_SITE_OTHER): Payer: Medicare Other

## 2013-05-20 DIAGNOSIS — M109 Gout, unspecified: Secondary | ICD-10-CM

## 2013-05-20 NOTE — Progress Notes (Signed)
Patient came in with labs for Dr.Drake

## 2013-05-23 LAB — SEDIMENTATION RATE: Sed Rate: 3 mm/hr (ref 0–40)

## 2013-05-23 LAB — URIC ACID: Uric Acid: 4.6 mg/dL (ref 2.5–7.1)

## 2013-05-26 ENCOUNTER — Ambulatory Visit: Payer: Medicare Other | Attending: Gynecologic Oncology | Admitting: Gynecologic Oncology

## 2013-05-26 ENCOUNTER — Other Ambulatory Visit (HOSPITAL_COMMUNITY)
Admission: RE | Admit: 2013-05-26 | Discharge: 2013-05-26 | Disposition: A | Discharge status: Home / Self Care | Payer: Medicare Other | Source: Ambulatory Visit | Attending: Gynecologic Oncology | Admitting: Gynecologic Oncology

## 2013-05-26 ENCOUNTER — Encounter: Payer: Self-pay | Admitting: Gynecologic Oncology

## 2013-05-26 VITALS — BP 110/56 | HR 68 | Temp 97.7°F | Resp 16 | Ht 65.0 in | Wt 166.9 lb

## 2013-05-26 DIAGNOSIS — C541 Malignant neoplasm of endometrium: Secondary | ICD-10-CM

## 2013-05-26 DIAGNOSIS — Z124 Encounter for screening for malignant neoplasm of cervix: Secondary | ICD-10-CM | POA: Insufficient documentation

## 2013-05-26 NOTE — Progress Notes (Signed)
Follow Up Note: Gyn-Onc  [name] 72 y.o. female  CC:  Chief Complaint  Patient presents with  . Endometrial adenocarcinoma    Follow up    HPI:  Catherine Cooper is a 72 year old female with a history of stage IA, grade 1 endometrioid adenocarcinoma.  She underwent a robotic hysterectomy, BSO, and bilateral pelvic lymph node dissection in March 2011.  Final pathology revealed a grade 1 endometrioid adenocarcinoma with 10% myometrial invasion, negative lymph nodes, negative adnexa, no lymphovascular space involvement, and negative washings.  She was last seen in February of 2014 with an unremarkable exam.     Interval History:  Catherine Cooper presents today for continued follow up.  She voices no complaints since her last visit.  She reports having to wear a boot on the left foot recently after being diagnosed with Charcot arthropathy in the fall of 2013.  She hopes to have permission to discontinue her boot today at a later visit.  She reports improvement in intermittent gas-like cramps in her upper abdomen since her last visit with the use of dexilant.  She continues to have a dry cough with no sputum production.  The cause has been investigated per the patient with no abnormal findings reported.  She states that allergy medication, like Zyrtec or Claritin, does not help with the cough.  She describes it as a "tickle" in her throat that catches her and makes her begin coughing.  She states that she is limited with her mobility and physical activity due to foot.  She reports her blood glucose as under control with occasional elevations in the 150 s or 160 s fasting in the am.  She states that she tries to go back and see what she ate to correlate it with the elevated blood glucose.  Blood glucose 135 this am fasting.  Recent A1C of 6.5.  No abdominal symptoms voiced.  See below for detailed review of systems.  No other concerns voiced.    Review of Systems Constitutional: Feels well.  No fever,  chills, unintentional weight gain or loss, early satiety, headaches, or visual changes.  Cardiovascular: No chest pain, shortness of breath, or edema.  Pulmonary: Continued dry cough with no sputum production.  No wheeze.  No difficulty swallowing.  Gastrointestinal: No nausea, vomiting, or diarrhea. No bright red blood per rectum or change in bowel movement.  Genitourinary: No frequency, urgency, hematuria, or dysuria. No vaginal bleeding or discharge.  Musculoskeletal: No myalgia or joint pain.  Boot to the left foot for support. Neurologic: No weakness, numbness, or change in gait.  Psychology: No depression, anxiety, or insomnia.  Health Maintenance: Mammogram: Up to date Pap Smear: 05/2012 Colonoscopy: 2013  Current Meds:  Outpatient Encounter Prescriptions as of 05/26/2013  Medication Sig Dispense Refill  . alendronate (FOSAMAX) 70 MG tablet Take 1 tablet (70 mg total) by mouth every 7 (seven) days. Take with a full glass of water on an empty stomach.  12 tablet  1  . aspirin 81 MG tablet Take 81 mg by mouth daily.        . brimonidine (ALPHAGAN P) 0.1 % SOLN Place 1 drop into both eyes 2 (two) times daily.        . Cholecalciferol (VITAMIN D3) 2000 UNITS TABS Take by mouth.        . dexlansoprazole (DEXILANT) 60 MG capsule Take 1 capsule (60 mg total) by mouth daily.  30 capsule  5  . ferrous gluconate (FERGON) 325 MG  tablet Take 325 mg by mouth at bedtime.      Marland Kitchen glipiZIDE (GLUCOTROL XL) 5 MG 24 hr tablet Take 10 mg by mouth daily. 1 PO QD      . ibuprofen (ADVIL,MOTRIN) 800 MG tablet Take 1 tablet (800 mg total) by mouth every 6 (six) hours as needed for pain.  30 tablet  0  . ketotifen (ALAWAY) 0.025 % ophthalmic solution 1 drop 2 (two) times daily.        Marland Kitchen L-Methylfolate-B6-B12 (METANX) 3-35-2 MG TABS Take 1 tablet by mouth daily.       Marland Kitchen loperamide (IMODIUM A-D) 2 MG tablet Take 2 mg by mouth 4 (four) times daily as needed.        Marland Kitchen LORazepam (ATIVAN) 0.5 MG tablet Take 1  tablet (0.5 mg total) by mouth 2 (two) times daily as needed for anxiety.  60 tablet  1  . Multiple Vitamin (MULTI-VITAMIN DAILY PO) Take by mouth. Century Mature for adults 50+      . saxagliptin HCl (ONGLYZA) 5 MG TABS tablet Take 1 tablet (5 mg total) by mouth daily.  90 tablet  1  . vitamin B-12 (CYANOCOBALAMIN) 1000 MCG tablet Take 1,000 mcg by mouth daily.        . [DISCONTINUED] ferrous sulfate 325 (65 FE) MG tablet Take 325 mg by mouth daily with breakfast.      . [DISCONTINUED] glipiZIDE (GLUCOTROL XL) 5 MG 24 hr tablet 1 PO QD  90 tablet  1  . [DISCONTINUED] Multiple Vitamin (MULTI VITAMIN MENS PO) Take by mouth.         No facility-administered encounter medications on file as of 05/26/2013.    Allergy:  Allergies  Allergen Reactions  . Lipitor [Atorvastatin]     fatique and myalgia  . Penicillins Rash    Social Hx:   History   Social History  . Marital Status: Married    Spouse Name: N/A    Number of Children: 3  . Years of Education: N/A   Occupational History  . retired     [employer]   Social History Main Topics  . Smoking status: Never Smoker   . Smokeless tobacco: Not on file  . Alcohol Use: No  . Drug Use: No  . Sexual Activity: Not on file   Other Topics Concern  . Not on file   Social History Narrative   2 healthy daughters and 1 son deceased (suicide @ 24)    Past Surgical Hx:  Past Surgical History  Procedure Laterality Date  .  vitrectomy  02/23/08    posterior; right eye  . Tubal ligation    . Abdominal hysterectomy    . Foot surgery      Right     Past Medical Hx:  Past Medical History  Diagnosis Date  . Diabetes mellitus     type II  . Osteoporosis   . Glaucoma   . GAD (generalized anxiety disorder)   . GERD (gastroesophageal reflux disease)   . Hyperlipidemia   . Endometrioid adenocarcinoma   . 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 Shingles 2004  .  Nephrolithiasis   . Cataracts, bilateral     Family Hx:  Family History  Problem Relation Age of Onset  . Dementia Father   . Diabetes Father   . Coronary artery disease Father 5  . Atrial fibrillation Mother     Vitals:  Blood pressure 110/56, pulse 68, temperature 97.7 F (36.5 C), temperature source Oral, resp. rate 16, height 5\' 5"  (1.651 m), weight 166 lb 14.4 oz (75.705 kg).  Physical Exam:  General: Well developed, well nourished female in no acute distress. Alert and oriented x 3.  Head/ Neck: Supple without any enlargements.  Oropharynx clear without lesions.  Sclerae anicteric.  Lymph node survey: No cervical, supraclavicular, or inguinal adenopathy  Cardiovascular: Regular rate and rhythm. S1 and S2 normal.  Lungs: Clear to auscultation bilaterally. No wheezes/crackles/rhonchi noted.  Skin: No rashes or lesions present. Back: No CVA tenderness.  Abdomen: Abdomen soft, non-tender and obese. Active bowel sounds in all quadrants. No evidence of a fluid wave or abdominal masses.  Genitourinary:    Vulva/vagina: Normal external female genitalia. No lesions.    Urethra: No lesions or masses.    Vagina: Atrophic without any lesions. No palpable masses. No vaginal bleeding or drainage noted.  Thin Prep pap obtained without difficulty.  Rectal: Good tone, no masses, no cul de sac nodularity.  Extremities: No bilateral cyanosis, edema, or clubbing.    Assessment/Plan:  Kamile Fassler is a 72 year old with stage IA, grade 1 endometrioid adenocarcinoma who clinically has no evidence of recurrent disease. She is > 3 years out from her diagnosis.  We will contact her with the results of her pap smear from today.  She will return to see [place] in 6 months or sooner if needed. She will follow up with her other physicians as scheduled.  She is instructed to please call the office for any questions or concerns.   Survivorship packet reviewed with reportable signs and symptoms, recommended  preventative screenings per the Society of Gynecologic Oncology, and support services offered through the [facility].    The patient was reviewed with Dr. Duard Brady.  Catherine Cooper DEAL, NP 05/26/2013, 10:12 AM

## 2013-05-26 NOTE — Patient Instructions (Signed)
We will contact you with the results of your pap smear from today.  Plan to follow up in six months.  Please call in Nov or Dec 2014 to schedule an appointment for Feb 2015.  Please call for any questions or concerns.

## 2013-05-30 ENCOUNTER — Telehealth: Payer: Self-pay | Admitting: *Deleted

## 2013-05-30 NOTE — Telephone Encounter (Signed)
Notified patient of most recent  Pap results!

## 2013-06-29 ENCOUNTER — Encounter: Payer: Self-pay | Admitting: Nurse Practitioner

## 2013-06-29 ENCOUNTER — Ambulatory Visit (INDEPENDENT_AMBULATORY_CARE_PROVIDER_SITE_OTHER): Payer: Medicare Other | Admitting: Nurse Practitioner

## 2013-06-29 VITALS — BP 133/73 | HR 76 | Temp 98.7°F | Ht 65.0 in | Wt 168.0 lb

## 2013-06-29 DIAGNOSIS — M810 Age-related osteoporosis without current pathological fracture: Secondary | ICD-10-CM

## 2013-06-29 DIAGNOSIS — E119 Type 2 diabetes mellitus without complications: Secondary | ICD-10-CM

## 2013-06-29 DIAGNOSIS — K219 Gastro-esophageal reflux disease without esophagitis: Secondary | ICD-10-CM

## 2013-06-29 DIAGNOSIS — F411 Generalized anxiety disorder: Secondary | ICD-10-CM

## 2013-06-29 DIAGNOSIS — I10 Essential (primary) hypertension: Secondary | ICD-10-CM

## 2013-06-29 DIAGNOSIS — E785 Hyperlipidemia, unspecified: Secondary | ICD-10-CM

## 2013-06-29 LAB — POCT UA - MICROALBUMIN: Microalbumin Ur, POC: 20 mg/L

## 2013-06-29 MED ORDER — ALENDRONATE SODIUM 70 MG PO TABS: 70.0000 mg | ORAL_TABLET | ORAL | Status: DC

## 2013-06-29 MED ORDER — SAXAGLIPTIN HCL 5 MG PO TABS: 5.0000 mg | ORAL_TABLET | Freq: Every day | ORAL | Status: DC

## 2013-06-29 MED ORDER — LORAZEPAM 0.5 MG PO TABS: 0.5000 mg | ORAL_TABLET | Freq: Two times a day (BID) | ORAL | Status: DC | PRN

## 2013-06-29 MED ORDER — GLIPIZIDE 10 MG PO TABS: 10.0000 mg | ORAL_TABLET | Freq: Every day | ORAL | Status: DC

## 2013-06-29 MED ORDER — DEXLANSOPRAZOLE 60 MG PO CPDR: 60.0000 mg | DELAYED_RELEASE_CAPSULE | Freq: Every day | ORAL | Status: DC

## 2013-06-29 NOTE — Patient Instructions (Addendum)

## 2013-06-29 NOTE — Progress Notes (Signed)
Subjective:    Patient ID: Catherine Cooper, female    DOB: 1941/07/06, 72 y.o.   MRN: [id number]  Hypertension This is a chronic problem. The current episode started more than 1 year ago. The problem has been resolved since onset. The problem is controlled. Pertinent negatives include no blurred vision, chest pain, headaches, orthopnea, palpitations, peripheral edema, shortness of breath or sweats. There are no associated agents to hypertension. Risk factors for coronary artery disease include diabetes mellitus, dyslipidemia, obesity and post-menopausal state. Past treatments include nothing. The current treatment provides significant improvement. Compliance problems include diet and exercise.   Hyperlipidemia This is a chronic problem. The current episode started more than 1 year ago. The problem is controlled. Recent lipid tests were reviewed and are normal. There are no known factors aggravating her hyperlipidemia. Associated symptoms include a focal sensory loss, a focal weakness, leg pain and myalgias. Pertinent negatives include no chest pain or shortness of breath. (Patient was on lipitor for 2 years and was just feeling awful.stopped taking 4 weeks ago and now feels 100 X better.) Current antihyperlipidemic treatment includes statins. The current treatment provides significant improvement of lipids. Compliance problems include adherence to diet and adherence to exercise.  Risk factors for coronary artery disease include diabetes mellitus, hypertension and post-menopausal.  Diabetes She presents for her follow-up diabetic visit. She has type 2 diabetes mellitus. MedicAlert identification noted. The initial diagnosis of diabetes was made 8 years ago. Her disease course has been improving. There are no hypoglycemic associated symptoms. Pertinent negatives for hypoglycemia include no headaches or sweats. Pertinent negatives for diabetes include no blurred vision, no chest pain, no polydipsia, no  polyphagia, no polyuria, no weakness and no weight loss. There are no hypoglycemic complications. Symptoms are stable. There are no diabetic complications. Risk factors for coronary artery disease include dyslipidemia, hypertension, post-menopausal and stress. Current diabetic treatment includes oral agent (dual therapy). She is compliant with treatment all of the time. Her weight is stable. She is following a generally healthy and diabetic diet. When asked about meal planning, she reported none. She has not had a previous visit with a dietician. She rarely participates in exercise. Her home blood glucose trend is fluctuating minimally. Her breakfast blood glucose is taken between 8-9 am. Her breakfast blood glucose range is generally 140-180 mg/dl. Her overall blood glucose range is 140-180 mg/dl. An ACE inhibitor/angiotensin II receptor blocker is not being taken. She does not see a podiatrist.Eye exam is current (6 months ago- has appointment next week.).  osteporosis Fosamax weekly without side effects- Very little weight bearing exercises. Gad Only takes alprazolam when she has to go stay with her mother in law!   Review of Systems  Constitutional: Negative for weight loss.  Eyes: Negative for blurred vision.  Respiratory: Negative for shortness of breath.   Cardiovascular: Negative for chest pain, palpitations and orthopnea.  Endocrine: Negative for polydipsia, polyphagia and polyuria.  Musculoskeletal: Positive for myalgias.  Neurological: Positive for focal weakness. Negative for weakness and headaches.  All other systems reviewed and are negative.       Objective:   Physical Exam  Constitutional: She is oriented to person, place, and time. She appears well-developed and well-nourished.  HENT:  Nose: Nose normal.  Mouth/Throat: Oropharynx is clear and moist.  Right cerumen impaction   Eyes: EOM are normal.  Neck: Trachea normal, normal range of motion and full passive range of  motion without pain. Neck supple. No JVD present. Carotid bruit is  not present. No thyromegaly present.  Cardiovascular: Normal rate, regular rhythm, normal heart sounds and intact distal pulses.  Exam reveals no gallop and no friction rub.   No murmur heard. Pulmonary/Chest: Effort normal and breath sounds normal.  Abdominal: Soft. Bowel sounds are normal. She exhibits no distension and no mass. There is no tenderness.  Musculoskeletal: Normal range of motion.  Lymphadenopathy:    She has no cervical adenopathy.  Neurological: She is alert and oriented to person, place, and time. She has normal reflexes.  Skin: Skin is warm and dry.  Psychiatric: She has a normal mood and affect. Her behavior is normal. Judgment and thought content normal.   BP 133/73  Pulse 76  Temp(Src) 98.7 F (37.1 C) (Oral)  Ht 5\' 5"  (1.651 m)  Wt 168 lb (76.204 kg)  BMI 27.96 kg/m2  Results for orders placed in visit on 06/29/13  POCT GLYCOSYLATED HEMOGLOBIN (HGB A1C)      Result Value Range   Hemoglobin A1C 6.1          Assessment & Plan:  1. DM type 2 (diabetes mellitus, type 2) Low carb diet- especially in th eevenings - POCT glycosylated hemoglobin (Hb A1C) - glipiZIDE (GLUCOTROL XL) 5 MG 24 hr tablet; 1 PO QD  Dispense: 90 tablet; Refill: 1 - saxagliptin HCl (ONGLYZA) 5 MG TABS tablet; Take 1 tablet (5 mg total) by mouth daily.  Dispense: 90 tablet; Refill: 1  2. HTN (hypertension) Low NA+ DieT - COMPLETE METABOLIC PANEL WITH GFR  3. Other and unspecified hyperlipidemia *Low fat diet and exercise\Will hold off on cholesterol meds since can't tolerate lipitor - NMR Lipoprofile with Lipids  4. Osteoporosis, unspecified Weight bearing exercise Schedule bone density test - alendronate (FOSAMAX) 70 MG tablet; Take 1 tablet (70 mg total) by mouth every 7 (seven) days. Take with a full glass of water on an empty stomach.  Dispense: 12 tablet; Refill: 1  5. GERD (gastroesophageal reflux disease) -  dexlansoprazole (DEXILANT) 60 MG capsule; Take 1 capsule (60 mg total) by mouth daily.  Dispense: 30 capsule; Refill: 5  6. GAD (generalized anxiety disorder) Stress management - LORazepam (ATIVAN) 0.5 MG tablet; Take 1 tablet (0.5 mg total) by mouth 2 (two) times daily as needed for anxiety.  Dispense: 60 tablet; Refill: 1   Mary-Margaret Daphine Deutscher, FNP

## 2013-06-29 NOTE — Addendum Note (Signed)
Addended by: Prescott Gum on: 06/29/2013 11:39 AM   Modules accepted: Orders

## 2013-06-30 LAB — MICROALBUMIN, URINE: Microalbumin, Urine: 14.3 ug/mL (ref 0.0–17.0)

## 2013-07-01 ENCOUNTER — Other Ambulatory Visit: Payer: Self-pay | Admitting: Nurse Practitioner

## 2013-07-01 LAB — NMR, LIPOPROFILE
Cholesterol: 178 mg/dL (ref <200)
HDL Particle Number: 30 umol/L — ABNORMAL LOW (ref >=30.5)
LDL Particle Number: 1345 nmol/L — ABNORMAL HIGH (ref <1000)
LDLC SERPL CALC-MCNC: 100 mg/dL — ABNORMAL HIGH (ref <100)
Triglycerides by NMR: 163 mg/dL — ABNORMAL HIGH (ref <150)

## 2013-07-01 LAB — CMP14+EGFR
Albumin: 3.9 g/dL (ref 3.5–4.8)
BUN: 10 mg/dL (ref 8–27)
CO2: 28 mmol/L (ref 18–29)
Chloride: 104 mmol/L (ref 97–108)
Creatinine, Ser: 0.99 mg/dL (ref 0.57–1.00)
GFR calc Af Amer: 65 mL/min/{1.73_m2} (ref >59)
Globulin, Total: 2.6 g/dL (ref 1.5–4.5)
Glucose: 153 mg/dL — ABNORMAL HIGH (ref 65–99)
Total Protein: 6.5 g/dL (ref 6.0–8.5)

## 2013-07-01 MED ORDER — GLIPIZIDE ER 5 MG PO TB24: 5.0000 mg | ORAL_TABLET | Freq: Every day | ORAL | Status: DC

## 2013-07-04 ENCOUNTER — Ambulatory Visit (INDEPENDENT_AMBULATORY_CARE_PROVIDER_SITE_OTHER): Payer: Medicare Other

## 2013-07-04 DIAGNOSIS — Z23 Encounter for immunization: Secondary | ICD-10-CM

## 2013-08-28 ENCOUNTER — Other Ambulatory Visit: Payer: Self-pay | Admitting: Nurse Practitioner

## 2013-08-29 NOTE — Telephone Encounter (Signed)
rx ready for pickup 

## 2013-08-29 NOTE — Telephone Encounter (Signed)
LAST OV 06/24/13. PLEASE PRINT FOR MAIL ORDER AND CALL PT. THANKS.

## 2013-09-08 ENCOUNTER — Other Ambulatory Visit: Payer: Self-pay | Admitting: Nurse Practitioner

## 2013-09-12 NOTE — Telephone Encounter (Signed)
Last seen and last glucose MMM  Wants a 90 day supply

## 2013-09-14 ENCOUNTER — Ambulatory Visit (INDEPENDENT_AMBULATORY_CARE_PROVIDER_SITE_OTHER): Payer: Medicare Other | Admitting: Family Medicine

## 2013-09-14 ENCOUNTER — Encounter: Payer: Self-pay | Admitting: Family Medicine

## 2013-09-14 VITALS — BP 138/68 | HR 98 | Temp 99.0°F | Ht 65.0 in | Wt 169.0 lb

## 2013-09-14 DIAGNOSIS — J209 Acute bronchitis, unspecified: Secondary | ICD-10-CM

## 2013-09-14 DIAGNOSIS — J069 Acute upper respiratory infection, unspecified: Secondary | ICD-10-CM

## 2013-09-14 MED ORDER — HYDROCOD POLST-CHLORPHEN POLST 10-8 MG/5ML PO LQCR: 5.0000 mL | Freq: Two times a day (BID) | ORAL | Status: DC | PRN

## 2013-09-14 MED ORDER — AZITHROMYCIN 250 MG PO TABS: ORAL_TABLET | ORAL | Status: DC

## 2013-09-14 NOTE — Progress Notes (Signed)
   Subjective:    Patient ID: Catherine Cooper, female    DOB: 30-Mar-1941, 72 y.o.   MRN: [id number]  HPI URI Symptoms  Onset: 2 days  Description: rhinorrhea, nasal congestion, cough Modifying factors: baseline diabetic, well controlled  Symptoms  Nasal discharge: yes  Fever: no Sore throat: yes  Cough: yes  Wheezing: no  Ear pain: no  GI symptoms: no  Sick contacts: unsure   Red Flags  Stiff neck: no  Dyspnea: no  Rash: no  Swallowing difficulty: no   Sinusitis Risk Factors  Headache/face pain: no  Double sickening: no  tooth pain: no   Allergy Risk Factors  Sneezing: no  Itchy scratchy throat: no  Seasonal symptoms: no   Flu Risk Factors  Headache: no  muscle aches: no  severe fatigue: no     Review of Systems  All other systems reviewed and are negative.       Objective:   Physical Exam  Constitutional: She is oriented to person, place, and time. She appears well-developed and well-nourished.  HENT:  Head: Normocephalic and atraumatic.  Right Ear: External ear normal.  Left Ear: External ear normal.  +nasal erythema, rhinorrhea bilaterally, + post oropharyngeal erythema    Eyes: Conjunctivae are normal. Pupils are equal, round, and reactive to light.  Neck: Normal range of motion. Neck supple.  Cardiovascular: Normal rate and regular rhythm.   Pulmonary/Chest: Effort normal and breath sounds normal. No respiratory distress. She has no wheezes.  Abdominal: Soft.  Musculoskeletal: Normal range of motion.  Neurological: She is alert and oriented to person, place, and time.  Skin: Skin is warm.          Assessment & Plan:  URI (upper respiratory infection) - Plan: chlorpheniramine-HYDROcodone (TUSSIONEX PENNKINETIC ER) 10-8 MG/5ML LQCR, azithromycin (ZITHROMAX) 250 MG tablet  Acute bronchitis - Plan: chlorpheniramine-HYDROcodone (TUSSIONEX PENNKINETIC ER) 10-8 MG/5ML LQCR  Likely viral source of sxs No resp red flags on exam  today Discussed supportive care and infectious/resp red flags Tussionex for cough  ppx rx for zpak if sxs fail to improve over next 7-10 days/worsen.  Follow up as needed.

## 2013-10-03 ENCOUNTER — Ambulatory Visit (INDEPENDENT_AMBULATORY_CARE_PROVIDER_SITE_OTHER): Payer: Medicare Other | Admitting: Nurse Practitioner

## 2013-10-03 ENCOUNTER — Encounter: Payer: Self-pay | Admitting: Nurse Practitioner

## 2013-10-03 VITALS — BP 125/70 | HR 81 | Temp 97.5°F | Ht 65.0 in | Wt 167.0 lb

## 2013-10-03 DIAGNOSIS — F411 Generalized anxiety disorder: Secondary | ICD-10-CM

## 2013-10-03 DIAGNOSIS — E785 Hyperlipidemia, unspecified: Secondary | ICD-10-CM

## 2013-10-03 DIAGNOSIS — I10 Essential (primary) hypertension: Secondary | ICD-10-CM

## 2013-10-03 DIAGNOSIS — E119 Type 2 diabetes mellitus without complications: Secondary | ICD-10-CM

## 2013-10-03 MED ORDER — SAXAGLIPTIN HCL 5 MG PO TABS: 5.0000 mg | ORAL_TABLET | Freq: Every day | ORAL | Status: DC

## 2013-10-03 MED ORDER — ALENDRONATE SODIUM 70 MG PO TABS: 70.0000 mg | ORAL_TABLET | ORAL | Status: DC

## 2013-10-03 MED ORDER — GLIPIZIDE ER 10 MG PO TB24: 10.0000 mg | ORAL_TABLET | Freq: Every day | ORAL | Status: DC

## 2013-10-03 NOTE — Patient Instructions (Signed)
Diabetes and Foot Care Diabetes may cause you to have problems because of poor blood supply (circulation) to your feet and legs. This may cause the skin on your feet to become thinner, break easier, and heal more slowly. Your skin may become dry, and the skin may peel and crack. You may also have nerve damage in your legs and feet causing decreased feeling in them. You may not notice minor injuries to your feet that could lead to infections or more serious problems. Taking care of your feet is one of the most important things you can do for yourself.  HOME CARE INSTRUCTIONS  Wear shoes at all times, even in the house. Do not go barefoot. Bare feet are easily injured.  Check your feet daily for blisters, cuts, and redness. If you cannot see the bottom of your feet, use a mirror or ask someone for help.  Wash your feet with warm water (do not use hot water) and mild soap. Then pat your feet and the areas between your toes until they are completely dry. Do not soak your feet as this can dry your skin.  Apply a moisturizing lotion or petroleum jelly (that does not contain alcohol and is unscented) to the skin on your feet and to dry, brittle toenails. Do not apply lotion between your toes.  Trim your toenails straight across. Do not dig under them or around the cuticle. File the edges of your nails with an emery board or nail file.  Do not cut corns or calluses or try to remove them with medicine.  Wear clean socks or stockings every day. Make sure they are not too tight. Do not wear knee-high stockings since they may decrease blood flow to your legs.  Wear shoes that fit properly and have enough cushioning. To break in new shoes, wear them for just a few hours a day. This prevents you from injuring your feet. Always look in your shoes before you put them on to be sure there are no objects inside.  Do not cross your legs. This may decrease the blood flow to your feet.  If you find a minor scrape,  cut, or break in the skin on your feet, keep it and the skin around it clean and dry. These areas may be cleansed with mild soap and water. Do not cleanse the area with peroxide, alcohol, or iodine.  When you remove an adhesive bandage, be sure not to damage the skin around it.  If you have a wound, look at it several times a day to make sure it is healing.  Do not use heating pads or hot water bottles. They may burn your skin. If you have lost feeling in your feet or legs, you may not know it is happening until it is too late.  Make sure your health care provider performs a complete foot exam at least annually or more often if you have foot problems. Report any cuts, sores, or bruises to your health care provider immediately. SEEK MEDICAL CARE IF:   You have an injury that is not healing.  You have cuts or breaks in the skin.  You have an ingrown nail.  You notice redness on your legs or feet.  You feel burning or tingling in your legs or feet.  You have pain or cramps in your legs and feet.  Your legs or feet are numb.  Your feet always feel cold. SEEK IMMEDIATE MEDICAL CARE IF:   There is increasing redness,   swelling, or pain in or around a wound.  There is a red line that goes up your leg.  Pus is coming from a wound.  You develop a fever or as directed by your health care provider.  You notice a bad smell coming from an ulcer or wound. Document Released: 09/26/2000 Document Revised: 06/01/2013 Document Reviewed: 03/08/2013 [employer] Patient Information 2014 [employer].  

## 2013-10-03 NOTE — Progress Notes (Signed)
Subjective:    Patient ID: Catherine Cooper, female    DOB: [birth year], 72 y.o.   MRN: [id number]  Patient here today for follow- up- no changes since last visit.  Hypertension This is a chronic problem. The current episode started more than 1 year ago. The problem has been resolved since onset. The problem is controlled. Pertinent negatives include no blurred vision, chest pain, headaches, orthopnea, palpitations, peripheral edema, shortness of breath or sweats. There are no associated agents to hypertension. Risk factors for coronary artery disease include diabetes mellitus, dyslipidemia, obesity and post-menopausal state. Past treatments include nothing. The current treatment provides significant improvement. Compliance problems include diet and exercise.   Hyperlipidemia This is a chronic problem. The current episode started more than 1 year ago. The problem is controlled. Recent lipid tests were reviewed and are normal. There are no known factors aggravating her hyperlipidemia. Associated symptoms include a focal sensory loss, a focal weakness, leg pain and myalgias. Pertinent negatives include no chest pain or shortness of breath. (Patient was on lipitor for 2 years and was just feeling awful.stopped taking 4 weeks ago and now feels 100 X better.) Current antihyperlipidemic treatment includes statins. The current treatment provides significant improvement of lipids. Compliance problems include adherence to diet and adherence to exercise.  Risk factors for coronary artery disease include diabetes mellitus, hypertension and post-menopausal.  Diabetes She presents for her follow-up diabetic visit. She has type 2 diabetes mellitus. MedicAlert identification noted. The initial diagnosis of diabetes was made 8 years ago. Her disease course has been improving. There are no hypoglycemic associated symptoms. Pertinent negatives for hypoglycemia include no headaches or sweats. Pertinent negatives for diabetes  include no blurred vision, no chest pain, no polydipsia, no polyphagia, no polyuria, no weakness and no weight loss. There are no hypoglycemic complications. Symptoms are stable. There are no diabetic complications. Risk factors for coronary artery disease include dyslipidemia, hypertension, post-menopausal and stress. Current diabetic treatment includes oral agent (dual therapy). She is compliant with treatment all of the time. Her weight is stable. She is following a generally healthy and diabetic diet. When asked about meal planning, she reported none. She has not had a previous visit with a dietician. She rarely participates in exercise. Her home blood glucose trend is fluctuating minimally. Her breakfast blood glucose is taken between 8-9 am. Her breakfast blood glucose range is generally 140-180 mg/dl. Her overall blood glucose range is 140-180 mg/dl. An ACE inhibitor/angiotensin II receptor blocker is not being taken. She does not see a podiatrist.Eye exam is current (6 months ago- has appointment next week.).  osteporosis Fosamax weekly without side effects- Very little weight bearing exercises. Gad Only takes alprazolam when she has to go stay with her mother in law!   Review of Systems  Constitutional: Negative for weight loss.  Eyes: Negative for blurred vision.  Respiratory: Negative for shortness of breath.   Cardiovascular: Negative for chest pain, palpitations and orthopnea.  Endocrine: Negative for polydipsia, polyphagia and polyuria.  Musculoskeletal: Positive for myalgias.  Neurological: Positive for focal weakness. Negative for weakness and headaches.  All other systems reviewed and are negative.       Objective:   Physical Exam  Constitutional: She is oriented to person, place, and time. She appears well-developed and well-nourished.  HENT:  Nose: Nose normal.  Mouth/Throat: Oropharynx is clear and moist.  Right cerumen impaction   Eyes: EOM are normal.  Neck: Trachea  normal, normal range of motion and full passive range  of motion without pain. Neck supple. No JVD present. Carotid bruit is not present. No thyromegaly present.  Cardiovascular: Normal rate, regular rhythm, normal heart sounds and intact distal pulses.  Exam reveals no gallop and no friction rub.   No murmur heard. Pulmonary/Chest: Effort normal and breath sounds normal.  Abdominal: Soft. Bowel sounds are normal. She exhibits no distension and no mass. There is no tenderness.  Musculoskeletal: Normal range of motion.  Lymphadenopathy:    She has no cervical adenopathy.  Neurological: She is alert and oriented to person, place, and time. She has normal reflexes.  Skin: Skin is warm and dry.  Psychiatric: She has a normal mood and affect. Her behavior is normal. Judgment and thought content normal.   BP 125/70  Pulse 81  Temp(Src) 97.5 F (36.4 C) (Oral)  Ht 5\' 5"  (1.651 m)  Wt 167 lb (75.751 kg)  BMI 27.79 kg/m2  Results for orders placed in visit on 10/03/13  POCT GLYCOSYLATED HEMOGLOBIN (HGB A1C)      Result Value Range   Hemoglobin A1C 6.2%          Assessment & Plan:   1. DM type 2 (diabetes mellitus, type 2)   2. HTN (hypertension)   3. Hyperlipidemia   4. GAD (generalized anxiety disorder)    Orders Placed This Encounter  Procedures  . CMP14+EGFR  . NMR, lipoprofile  . POCT glycosylated hemoglobin (Hb A1C)   Meds ordered this encounter  Medications  . glipiZIDE (GLIPIZIDE XL) 10 MG 24 hr tablet    Sig: Take 1 tablet (10 mg total) by mouth daily with breakfast.    Dispense:  90 tablet    Refill:  3    Order Specific Question:  Supervising Provider    Answer:  Ernestina Penna [[id number]]  . saxagliptin HCl (ONGLYZA) 5 MG TABS tablet    Sig: Take 1 tablet (5 mg total) by mouth daily.    Dispense:  90 tablet    Refill:  3    Order Specific Question:  Supervising Provider    Answer:  Ernestina Penna [[id number]]  . alendronate (FOSAMAX) 70 MG tablet    Sig: Take 1 tablet  (70 mg total) by mouth once a week. Take with a full glass of water on an empty stomach.    Dispense:  12 tablet    Refill:  3    Order Specific Question:  Supervising Provider    Answer:  Deborra Medina    Continue all meds Labs pending Diet and exercise encouraged Health maintenance reviewed Follow up in 3 months  Mary-Margaret Daphine Deutscher, FNP

## 2013-10-04 LAB — CMP14+EGFR
ALT: 15 IU/L (ref 0–32)
Albumin/Globulin Ratio: 2 (ref 1.1–2.5)
Albumin: 4.3 g/dL (ref 3.5–4.8)
Alkaline Phosphatase: 70 IU/L (ref 39–117)
BUN/Creatinine Ratio: 15 (ref 11–26)
BUN: 15 mg/dL (ref 8–27)
CO2: 25 mmol/L (ref 18–29)
Calcium: 8.8 mg/dL (ref 8.6–10.2)
Chloride: 104 mmol/L (ref 97–108)
Creatinine, Ser: 0.97 mg/dL (ref 0.57–1.00)
GFR calc Af Amer: 67 mL/min/{1.73_m2} (ref >59)
GFR calc non Af Amer: 58 mL/min/{1.73_m2} — ABNORMAL LOW (ref >59)
Globulin, Total: 2.2 g/dL (ref 1.5–4.5)
Glucose: 137 mg/dL — ABNORMAL HIGH (ref 65–99)
Potassium: 4.8 mmol/L (ref 3.5–5.2)
Total Bilirubin: 0.5 mg/dL (ref 0.0–1.2)

## 2013-10-04 LAB — NMR, LIPOPROFILE
HDL Cholesterol by NMR: 41 mg/dL (ref >=40)
LDL Particle Number: 1066 nmol/L — ABNORMAL HIGH (ref <1000)
LDL Size: 20.3 nm — ABNORMAL LOW (ref >20.5)
LDLC SERPL CALC-MCNC: 95 mg/dL (ref <100)
LP-IR Score: 54 — ABNORMAL HIGH (ref <=45)

## 2013-11-16 ENCOUNTER — Ambulatory Visit: Payer: Medicare Other | Attending: Gynecologic Oncology | Admitting: Gynecologic Oncology

## 2013-11-16 ENCOUNTER — Encounter: Payer: Self-pay | Admitting: Gynecologic Oncology

## 2013-11-16 VITALS — BP 121/56 | HR 87 | Temp 98.2°F | Resp 16 | Ht 65.0 in | Wt 168.7 lb

## 2013-11-16 DIAGNOSIS — C541 Malignant neoplasm of endometrium: Secondary | ICD-10-CM

## 2013-11-16 NOTE — Progress Notes (Signed)
Follow Up Note: Gyn-Onc  Catherine Cooper 73 y.o. female  CC:  Chief Complaint  Patient presents with  . Endometrial adenocarcinoma    Follow up    HPI:  Catherine Cooper is a 73 year old female with a history of stage IA, grade 1 endometrioid adenocarcinoma.  She underwent a robotic hysterectomy, BSO, and bilateral pelvic lymph node dissection in March 2011.  Final pathology revealed a grade 1 endometrioid adenocarcinoma with 10% myometrial invasion, negative lymph nodes, negative adnexa, no lymphovascular space involvement, and negative washings.  She was last seen in August of 2014 with an unremarkable exam and normal pap smear.     Interval History:  Catherine Cooper presents today for continued follow up.  She voices no complaints since her last visit.  She reports improvement with her left foot and Charcot arthropathy.  She reports improvement in intermittent gas-like cramps in her upper abdomen since her last visit with the use of dexilant.  She continues to have a dry cough with no sputum production and states "I think it is just me" since testing has been negative. She continues to describe it as a "tickle" in her throat that catches her and makes her begin coughing.  She reports her blood glucose as under control with occasional elevations, reading 146 this am.  She states that she tries to go back and see what she ate to correlate it with the elevated blood glucose.  No abdominal symptoms or vaginal bleeding reported.  She is excited to have a great-grandchild on the way in August.  See below for detailed review of systems.  No other concerns voiced.    Review of Systems Constitutional: Feels well.  No fever, chills, unintentional weight gain or loss, early satiety, headaches, or visual changes.  Cardiovascular: No chest pain, shortness of breath, or edema.  Pulmonary: Continued dry cough with no sputum production.  No wheeze.  No difficulty swallowing.  Gastrointestinal: No nausea, vomiting,  or diarrhea. No bright red blood per rectum or change in bowel movement.  Genitourinary: No frequency, urgency, hematuria, or dysuria. No vaginal bleeding or discharge.  Musculoskeletal: No myalgia or joint pain.  Intermittent left foot discomfort that resolves after a few seconds. Neurologic: No weakness, numbness, or change in gait.  Psychology: No depression, anxiety, or insomnia.  Health Maintenance: Mammogram: Up to date Pap Smear: 05/2013 Colonoscopy: 2013  Current Meds:  Outpatient Encounter Prescriptions as of 11/16/2013  Medication Sig  . alendronate (FOSAMAX) 70 MG tablet Take 1 tablet (70 mg total) by mouth once a week. Take with a full glass of water on an empty stomach.  Marland Kitchen aspirin 81 MG tablet Take 81 mg by mouth daily.    . brimonidine (ALPHAGAN P) 0.1 % SOLN Place 1 drop into both eyes 2 (two) times daily.    . Cholecalciferol (VITAMIN D3) 2000 UNITS TABS Take by mouth.    . dexlansoprazole (DEXILANT) 60 MG capsule Take 1 capsule (60 mg total) by mouth daily.  . ferrous gluconate (FERGON) 325 MG tablet Take 325 mg by mouth at bedtime.  Marland Kitchen glipiZIDE (GLIPIZIDE XL) 10 MG 24 hr tablet Take 1 tablet (10 mg total) by mouth daily with breakfast.  . ibuprofen (ADVIL,MOTRIN) 800 MG tablet Take 1 tablet (800 mg total) by mouth every 6 (six) hours as needed for pain.  Marland Kitchen ketotifen (ALAWAY) 0.025 % ophthalmic solution 1 drop 2 (two) times daily.    . Multiple Vitamin (MULTI-VITAMIN DAILY PO) Take by mouth. Century Mature for  adults 50+  . saxagliptin HCl (ONGLYZA) 5 MG TABS tablet Take 1 tablet (5 mg total) by mouth daily.  . vitamin B-12 (CYANOCOBALAMIN) 1000 MCG tablet Take 1,000 mcg by mouth daily.    Marland Kitchen L-Methylfolate-B6-B12 (METANX) 3-35-2 MG TABS Take 1 tablet by mouth daily.   Marland Kitchen loperamide (IMODIUM A-D) 2 MG tablet Take 2 mg by mouth 4 (four) times daily as needed.    Marland Kitchen LORazepam (ATIVAN) 0.5 MG tablet Take 1 tablet (0.5 mg total) by mouth 2 (two) times daily as needed for anxiety.   . [DISCONTINUED] azithromycin (ZITHROMAX) 250 MG tablet Take 2 tabs PO x 1 dose, then 1 tab PO QD x 4 days  . [DISCONTINUED] chlorpheniramine-HYDROcodone (TUSSIONEX PENNKINETIC ER) 10-8 MG/5ML LQCR Take 5 mLs by mouth every 12 (twelve) hours as needed for cough (cough).    Allergy:  Allergies  Allergen Reactions  . Lipitor [Atorvastatin]     fatique and myalgia  . Penicillins Rash    Social Hx:   History   Social History  . Marital Status: Married    Spouse Name: N/A    Number of Children: 3  . Years of Education: N/A   Occupational History  . retired     [employer]   Social History Main Topics  . Smoking status: Never Smoker   . Smokeless tobacco: Not on file  . Alcohol Use: No  . Drug Use: No  . Sexual Activity: Not on file   Other Topics Concern  . Not on file   Social History Narrative   2 healthy daughters and 1 son deceased (suicide @ 16)    Past Surgical Hx:  Past Surgical History  Procedure Laterality Date  .  vitrectomy  02/23/08    posterior; right eye  . Tubal ligation    . Abdominal hysterectomy    . Foot surgery      Right     Past Medical Hx:  Past Medical History  Diagnosis Date  . Diabetes mellitus     type II  . Osteoporosis   . Glaucoma   . GAD (generalized anxiety disorder)   . GERD (gastroesophageal reflux disease)   . Hyperlipidemia   . Endometrioid adenocarcinoma   . 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 Shingles 2004  . Nephrolithiasis   . Cataracts, bilateral     Family Hx:  Family History  Problem Relation Age of Onset  . Dementia Father   . Diabetes Father   . Coronary artery disease Father 70  . Atrial fibrillation Mother     Vitals:  Blood pressure 121/56, pulse 87, temperature 98.2 F (36.8 C), temperature source Oral, resp. rate 16, height 5\' 5"  (1.651 m), weight 168 lb 11.2 oz (76.522 kg).  Physical Exam:  General: Well developed, well nourished  female in no acute distress. Alert and oriented x 3.  Head/ Neck: Supple without any enlargements.  Oropharynx clear without lesions.  Sclerae anicteric.  Lymph node survey: No cervical, supraclavicular, or inguinal adenopathy.  Cardiovascular: Regular rate and rhythm. S1 and S2 normal.  Lungs: Clear to auscultation bilaterally. No wheezes/crackles/rhonchi noted.  Skin: No rashes or lesions present. Back: No CVA tenderness.  Abdomen: Abdomen soft, non-tender and obese. Active bowel sounds in all quadrants. No evidence of a fluid wave or abdominal masses.  Genitourinary:    Vulva/vagina: Normal external female genitalia. No lesions.    Urethra: No lesions or masses.  Vagina: Atrophic without any lesions. No palpable masses. No vaginal bleeding or drainage noted.   Rectal: Good tone, no masses, no cul de sac nodularity.  Extremities: No bilateral cyanosis, edema, or clubbing.    Assessment/Plan:  Catherine Cooper is a 73 year old with stage IA, grade 1 endometrioid adenocarcinoma who clinically has no evidence of recurrent disease. She will be 4 years out from her diagnosis in March 2015.  She will return to see [place] in 6 months or sooner if needed. She will follow up with her other physicians as scheduled.  She is instructed to please call the office for any questions or concerns.   Reportable signs and symptoms reviewed.  She states she still has her information on reportable signs and symptoms at home.     CROSS, MELISSA DEAL, NP 11/16/2013, 2:52 PM

## 2013-11-16 NOTE — Patient Instructions (Signed)
Doing great!  Plan to follow up in six months (August) or sooner if needed.  Please call for any development of symptoms or concerns.

## 2013-12-09 ENCOUNTER — Telehealth: Payer: Self-pay | Admitting: Nurse Practitioner

## 2014-01-04 ENCOUNTER — Ambulatory Visit (INDEPENDENT_AMBULATORY_CARE_PROVIDER_SITE_OTHER): Payer: Medicare Other | Admitting: Nurse Practitioner

## 2014-01-04 ENCOUNTER — Encounter: Payer: Self-pay | Admitting: Nurse Practitioner

## 2014-01-04 VITALS — BP 129/65 | HR 87 | Temp 97.9°F | Ht 65.0 in | Wt 168.8 lb

## 2014-01-04 DIAGNOSIS — E785 Hyperlipidemia, unspecified: Secondary | ICD-10-CM

## 2014-01-04 DIAGNOSIS — I10 Essential (primary) hypertension: Secondary | ICD-10-CM

## 2014-01-04 DIAGNOSIS — E119 Type 2 diabetes mellitus without complications: Secondary | ICD-10-CM

## 2014-01-04 LAB — POCT GLYCOSYLATED HEMOGLOBIN (HGB A1C): Hemoglobin A1C: 6.2

## 2014-01-04 NOTE — Patient Instructions (Addendum)
Fat and Cholesterol Control Diet  Fat and cholesterol levels in your blood and organs are influenced by your diet. High levels of fat and cholesterol may lead to diseases of the heart, small and large blood vessels, gallbladder, liver, and pancreas.  CONTROLLING FAT AND CHOLESTEROL WITH DIET  Although exercise and lifestyle factors are important, your diet is key. That is because certain foods are known to raise cholesterol and others to lower it. The goal is to balance foods for their effect on cholesterol and more importantly, to replace saturated and trans fat with other types of fat, such as monounsaturated fat, polyunsaturated fat, and omega-3 fatty acids.  On average, a person should consume no more than 15 to 17 g of saturated fat daily. Saturated and trans fats are considered "bad" fats, and they will raise LDL cholesterol. Saturated fats are primarily found in animal products such as meats, butter, and cream. However, that does not mean you need to give up all your favorite foods. Today, there are good tasting, low-fat, low-cholesterol substitutes for most of the things you like to eat. Choose low-fat or nonfat alternatives. Choose round or loin cuts of red meat. These types of cuts are lowest in fat and cholesterol. Chicken (without the skin), fish, veal, and ground turkey breast are great choices. Eliminate fatty meats, such as hot dogs and salami. Even shellfish have little or no saturated fat. Have a 3 oz (85 g) portion when you eat lean meat, poultry, or fish.  Trans fats are also called "partially hydrogenated oils." They are oils that have been scientifically manipulated so that they are solid at room temperature resulting in a longer shelf life and improved taste and texture of foods in which they are added. Trans fats are found in stick margarine, some tub margarines, cookies, crackers, and baked goods.   When baking and cooking, oils are a great substitute for butter. The monounsaturated oils are  especially beneficial since it is believed they lower LDL and raise HDL. The oils you should avoid entirely are saturated tropical oils, such as coconut and palm.   Remember to eat a lot from food groups that are naturally free of saturated and trans fat, including fish, fruit, vegetables, beans, grains (barley, rice, couscous, bulgur wheat), and pasta (without cream sauces).   IDENTIFYING FOODS THAT LOWER FAT AND CHOLESTEROL   Soluble fiber may lower your cholesterol. This type of fiber is found in fruits such as apples, vegetables such as broccoli, potatoes, and carrots, legumes such as beans, peas, and lentils, and grains such as barley. Foods fortified with plant sterols (phytosterol) may also lower cholesterol. You should eat at least 2 g per day of these foods for a cholesterol lowering effect.   Read package labels to identify low-saturated fats, trans fat free, and low-fat foods at the supermarket. Select cheeses that have only 2 to 3 g saturated fat per ounce. Use a heart-healthy tub margarine that is free of trans fats or partially hydrogenated oil. When buying baked goods (cookies, crackers), avoid partially hydrogenated oils. Breads and muffins should be made from whole grains (whole-wheat or whole oat flour, instead of "flour" or "enriched flour"). Buy non-creamy canned soups with reduced salt and no added fats.   FOOD PREPARATION TECHNIQUES   Never deep-fry. If you must fry, either stir-fry, which uses very little fat, or use non-stick cooking sprays. When possible, broil, bake, or roast meats, and steam vegetables. Instead of putting butter or margarine on vegetables, use lemon   and herbs, applesauce, and cinnamon (for squash and sweet potatoes). Use nonfat yogurt, salsa, and low-fat dressings for salads.   LOW-SATURATED FAT / LOW-FAT FOOD SUBSTITUTES  Meats / Saturated Fat (g)  · Avoid: Steak, marbled (3 oz/85 g) / 11 g  · Choose: Steak, lean (3 oz/85 g) / 4 g  · Avoid: Hamburger (3 oz/85 g) / 7  g  · Choose: Hamburger, lean (3 oz/85 g) / 5 g  · Avoid: Ham (3 oz/85 g) / 6 g  · Choose: Ham, lean cut (3 oz/85 g) / 2.4 g  · Avoid: Chicken, with skin, dark meat (3 oz/85 g) / 4 g  · Choose: Chicken, skin removed, dark meat (3 oz/85 g) / 2 g  · Avoid: Chicken, with skin, light meat (3 oz/85 g) / 2.5 g  · Choose: Chicken, skin removed, light meat (3 oz/85 g) / 1 g  Dairy / Saturated Fat (g)  · Avoid: Whole milk (1 cup) / 5 g  · Choose: Low-fat milk, 2% (1 cup) / 3 g  · Choose: Low-fat milk, 1% (1 cup) / 1.5 g  · Choose: Skim milk (1 cup) / 0.3 g  · Avoid: Hard cheese (1 oz/28 g) / 6 g  · Choose: Skim milk cheese (1 oz/28 g) / 2 to 3 g  · Avoid: Cottage cheese, 4% fat (1 cup) / 6.5 g  · Choose: Low-fat cottage cheese, 1% fat (1 cup) / 1.5 g  · Avoid: Ice cream (1 cup) / 9 g  · Choose: Sherbet (1 cup) / 2.5 g  · Choose: Nonfat frozen yogurt (1 cup) / 0.3 g  · Choose: Frozen fruit bar / trace  · Avoid: Whipped cream (1 tbs) / 3.5 g  · Choose: Nondairy whipped topping (1 tbs) / 1 g  Condiments / Saturated Fat (g)  · Avoid: Mayonnaise (1 tbs) / 2 g  · Choose: Low-fat mayonnaise (1 tbs) / 1 g  · Avoid: Butter (1 tbs) / 7 g  · Choose: Extra light margarine (1 tbs) / 1 g  · Avoid: Coconut oil (1 tbs) / 11.8 g  · Choose: Olive oil (1 tbs) / 1.8 g  · Choose: Corn oil (1 tbs) / 1.7 g  · Choose: Safflower oil (1 tbs) / 1.2 g  · Choose: Sunflower oil (1 tbs) / 1.4 g  · Choose: Soybean oil (1 tbs) / 2.4 g  · Choose: Canola oil (1 tbs) / 1 g  Document Released: 09/29/2005 Document Revised: 01/24/2013 Document Reviewed: 03/20/2011  ExitCare® Patient Information ©2014 [employer].

## 2014-01-04 NOTE — Progress Notes (Signed)
Subjective:    Patient ID: Catherine Cooper, female    DOB: [birth year], 73 y.o.   MRN: [id number]  Patient here today for follow up of chronic medical problems- no changes or new complaints since last visit.  Hypertension This is a chronic problem. The current episode started more than 1 year ago. The problem has been resolved since onset. The problem is controlled. Associated symptoms include shortness of breath (Only with exertion). Pertinent negatives include no blurred vision, chest pain, headaches, orthopnea, palpitations, peripheral edema or sweats. There are no associated agents to hypertension. Risk factors for coronary artery disease include diabetes mellitus, dyslipidemia, obesity and post-menopausal state. Past treatments include nothing. The current treatment provides significant improvement. Compliance problems include diet and exercise.   Hyperlipidemia This is a chronic problem. The current episode started more than 1 year ago. The problem is controlled. Recent lipid tests were reviewed and are normal. There are no known factors aggravating her hyperlipidemia. Associated symptoms include shortness of breath (Only with exertion). Pertinent negatives include no chest pain, focal weakness, leg pain or myalgias. (No longer taking Liptor due to leg pain, numbness and tingling, and fatigue related to medication) She is currently on no antihyperlipidemic treatment. The current treatment provides significant improvement of lipids. Compliance problems include adherence to diet and adherence to exercise.  Risk factors for coronary artery disease include diabetes mellitus, hypertension and post-menopausal.  Diabetes She presents for her follow-up diabetic visit. She has type 2 diabetes mellitus. MedicAlert identification noted. The initial diagnosis of diabetes was made 8 years ago. Her disease course has been improving. There are no hypoglycemic associated symptoms. Pertinent negatives for  hypoglycemia include no headaches or sweats. Pertinent negatives for diabetes include no blurred vision, no chest pain, no polydipsia, no polyphagia, no polyuria, no weakness and no weight loss. There are no hypoglycemic complications. Symptoms are stable. There are no diabetic complications. Risk factors for coronary artery disease include dyslipidemia, hypertension, post-menopausal and stress. Current diabetic treatment includes oral agent (dual therapy). She is compliant with treatment all of the time. Her weight is stable. She is following a generally healthy and diabetic diet. When asked about meal planning, she reported none. She has not had a previous visit with a dietician. She rarely participates in exercise. Her home blood glucose trend is fluctuating minimally. Her breakfast blood glucose is taken between 8-9 am. Her breakfast blood glucose range is generally 140-180 mg/dl. Her overall blood glucose range is 140-180 mg/dl. (Patient states blood sugars run around 140-148 ) An ACE inhibitor/angiotensin II receptor blocker is not being taken. She sees a podiatrist (Dr. Irving Shows every 6 weeks).Eye exam is current (6 months ago- has appointment next week.).  osteporosis Fosamax weekly without side effects- Very little weight bearing exercises due to neuropathy Gad Only takes alprazolam when she has to go stay with her mother in law - mother in law has Altimzer's *Sees two opthalmologist at least a year *History of uterine cancer - has hysterectomy, sees Oncology twice a year currently. Manages PAP.   Review of Systems  Constitutional: Negative for weight loss.  Eyes: Negative for blurred vision.  Respiratory: Positive for shortness of breath (Only with exertion).   Cardiovascular: Negative for chest pain, palpitations and orthopnea.  Endocrine: Negative for polydipsia, polyphagia and polyuria.  Musculoskeletal: Negative for myalgias.  Neurological: Negative for focal weakness, weakness and  headaches.  All other systems reviewed and are negative.       Objective:   Physical Exam  Constitutional: She is oriented to person, place, and time. She appears well-developed and well-nourished.  HENT:  Left Ear: External ear normal.  Nose: Nose normal.  Mouth/Throat: Oropharynx is clear and moist.  Eyes: Pupils are equal, round, and reactive to light.  Neck: Trachea normal, normal range of motion and full passive range of motion without pain. Neck supple. No JVD present. Carotid bruit is not present. No thyromegaly present.  Cardiovascular: Normal rate, regular rhythm, normal heart sounds and intact distal pulses.  Exam reveals no gallop and no friction rub.   No murmur heard. Pulmonary/Chest: Effort normal and breath sounds normal.  Abdominal: Soft. Bowel sounds are normal. She exhibits no distension and no mass. There is no tenderness.  Musculoskeletal: Normal range of motion.  Lymphadenopathy:    She has no cervical adenopathy.  Neurological: She is alert and oriented to person, place, and time. She has normal reflexes.  Skin: Skin is warm and dry.  Psychiatric: She has a normal mood and affect. Her behavior is normal. Judgment and thought content normal.   BP 129/65  Pulse 87  Temp(Src) 97.9 F (36.6 C) (Oral)  Ht 5' 5"  (1.651 m)  Wt 168 lb 12.8 oz (76.567 kg)  BMI 28.09 kg/m2  Results for orders placed in visit on 01/04/14  POCT GLYCOSYLATED HEMOGLOBIN (HGB A1C)      Result Value Ref Range   Hemoglobin A1C 6.2      Assessment & Plan:   1. DM type 2 (diabetes mellitus, type 2)   2. HTN (hypertension)   3. Hyperlipidemia    Orders Placed This Encounter  Procedures  . NMR, lipoprofile  . CMP14+EGFR  . POCT glycosylated hemoglobin (Hb A1C)    Labs pending Health maintenance reviewed  Diet and exercise encouraged - discussed chair exercises due to neuropathy Continue all meds Follow up  In 3 month   Knob Noster, FNP

## 2014-01-06 LAB — CMP14+EGFR
ALT: 18 IU/L (ref 0–32)
AST: 16 IU/L (ref 0–40)
Albumin/Globulin Ratio: 1.7 (ref 1.1–2.5)
Albumin: 4.1 g/dL (ref 3.5–4.8)
Alkaline Phosphatase: 73 IU/L (ref 39–117)
BILIRUBIN TOTAL: 0.3 mg/dL (ref 0.0–1.2)
BUN / CREAT RATIO: 16 (ref 11–26)
BUN: 16 mg/dL (ref 8–27)
CO2: 24 mmol/L (ref 18–29)
Calcium: 9.2 mg/dL (ref 8.7–10.3)
Chloride: 102 mmol/L (ref 97–108)
Creatinine, Ser: 0.98 mg/dL (ref 0.57–1.00)
GFR calc non Af Amer: 57 mL/min/{1.73_m2} — ABNORMAL LOW (ref >59)
GFR, EST AFRICAN AMERICAN: 66 mL/min/{1.73_m2} (ref >59)
Globulin, Total: 2.4 g/dL (ref 1.5–4.5)
Glucose: 167 mg/dL — ABNORMAL HIGH (ref 65–99)
Potassium: 4.9 mmol/L (ref 3.5–5.2)
SODIUM: 141 mmol/L (ref 134–144)
Total Protein: 6.5 g/dL (ref 6.0–8.5)

## 2014-01-06 LAB — NMR, LIPOPROFILE
CHOLESTEROL: 171 mg/dL (ref <200)
HDL Cholesterol by NMR: 43 mg/dL (ref >=40)
HDL PARTICLE NUMBER: 28.8 umol/L — AB (ref >=30.5)
LDL Particle Number: 1057 nmol/L — ABNORMAL HIGH (ref <1000)
LDL SIZE: 20.4 nm — AB (ref >20.5)
LDLC SERPL CALC-MCNC: 98 mg/dL (ref <100)
LP-IR Score: 60 — ABNORMAL HIGH (ref <=45)
Small LDL Particle Number: 582 nmol/L — ABNORMAL HIGH (ref <=527)
Triglycerides by NMR: 149 mg/dL (ref <150)

## 2014-03-19 ENCOUNTER — Other Ambulatory Visit: Payer: Self-pay | Admitting: Nurse Practitioner

## 2014-03-20 NOTE — Telephone Encounter (Signed)
Patient last seen in office on 01-04-14. Rx last filled on 11-25-13 for #[phone]. Please advise. If approved please route to Pool B so nurse can phone in to pharmacy

## 2014-03-20 NOTE — Telephone Encounter (Signed)
Please call in ativan with 1 refills 

## 2014-03-21 NOTE — Telephone Encounter (Signed)
Rx called into [facility] with 1 refill per [facility]mm

## 2014-04-12 ENCOUNTER — Encounter: Payer: Self-pay | Admitting: Nurse Practitioner

## 2014-04-12 ENCOUNTER — Ambulatory Visit (INDEPENDENT_AMBULATORY_CARE_PROVIDER_SITE_OTHER): Payer: Medicare Other | Admitting: Nurse Practitioner

## 2014-04-12 VITALS — BP 138/78 | HR 74 | Temp 98.3°F | Ht 65.0 in | Wt 170.0 lb

## 2014-04-12 DIAGNOSIS — M810 Age-related osteoporosis without current pathological fracture: Secondary | ICD-10-CM

## 2014-04-12 DIAGNOSIS — Z23 Encounter for immunization: Secondary | ICD-10-CM

## 2014-04-12 DIAGNOSIS — K219 Gastro-esophageal reflux disease without esophagitis: Secondary | ICD-10-CM

## 2014-04-12 DIAGNOSIS — E785 Hyperlipidemia, unspecified: Secondary | ICD-10-CM

## 2014-04-12 DIAGNOSIS — I10 Essential (primary) hypertension: Secondary | ICD-10-CM

## 2014-04-12 DIAGNOSIS — E119 Type 2 diabetes mellitus without complications: Secondary | ICD-10-CM

## 2014-04-12 LAB — POCT GLYCOSYLATED HEMOGLOBIN (HGB A1C): Hemoglobin A1C: 6.7

## 2014-04-12 MED ORDER — ALENDRONATE SODIUM 70 MG PO TABS: 70.0000 mg | ORAL_TABLET | ORAL | Status: DC

## 2014-04-12 MED ORDER — SAXAGLIPTIN HCL 5 MG PO TABS: 5.0000 mg | ORAL_TABLET | Freq: Every day | ORAL | Status: DC

## 2014-04-12 MED ORDER — DEXLANSOPRAZOLE 60 MG PO CPDR: 60.0000 mg | DELAYED_RELEASE_CAPSULE | Freq: Every day | ORAL | Status: DC

## 2014-04-12 MED ORDER — LORAZEPAM 0.5 MG PO TABS
ORAL_TABLET | ORAL | Status: DC
Start: 2014-04-12 — End: 2014-10-25

## 2014-04-12 NOTE — Progress Notes (Signed)
Subjective:    Patient ID: Catherine Cooper, female    DOB: 1941-05-09, 73 y.o.   MRN: [id number]  Patient here today for follow- up- no changes since last visit. No complaints today.  Diabetes She presents for her follow-up diabetic visit. She has type 2 diabetes mellitus. MedicAlert identification noted. The initial diagnosis of diabetes was made 8 years ago. Her disease course has been improving. There are no hypoglycemic associated symptoms. Pertinent negatives for hypoglycemia include no headaches or sweats. Pertinent negatives for diabetes include no blurred vision, no chest pain, no polydipsia, no polyphagia, no polyuria, no weakness and no weight loss. There are no hypoglycemic complications. Symptoms are stable. There are no diabetic complications. Risk factors for coronary artery disease include dyslipidemia, hypertension, post-menopausal and stress. Current diabetic treatment includes oral agent (dual therapy). She is compliant with treatment all of the time. Her weight is stable. She is following a generally healthy and diabetic diet. When asked about meal planning, she reported none. She has not had a previous visit with a dietician. She rarely participates in exercise. Her home blood glucose trend is fluctuating minimally. Her breakfast blood glucose is taken between 8-9 am. Her breakfast blood glucose range is generally 140-180 mg/dl. Her overall blood glucose range is 140-180 mg/dl. An ACE inhibitor/angiotensin II receptor blocker is not being taken. She does not see a podiatrist.Eye exam is current (6 months ago- has appointment next week.).  Hypertension This is a chronic problem. The current episode started more than 1 year ago. The problem has been resolved since onset. The problem is controlled. Pertinent negatives include no blurred vision, chest pain, headaches, orthopnea, palpitations, peripheral edema, shortness of breath or sweats. There are no associated agents to hypertension.  Risk factors for coronary artery disease include diabetes mellitus, dyslipidemia, obesity and post-menopausal state. Past treatments include nothing. The current treatment provides significant improvement. Compliance problems include diet and exercise.   Hyperlipidemia This is a chronic problem. The current episode started more than 1 year ago. The problem is controlled. Recent lipid tests were reviewed and are normal. There are no known factors aggravating her hyperlipidemia. Associated symptoms include a focal sensory loss, a focal weakness, leg pain and myalgias. Pertinent negatives include no chest pain or shortness of breath. (Patient was on lipitor for 2 years and was just feeling awful.stopped taking 4 weeks ago and now feels 100 X better.) Current antihyperlipidemic treatment includes statins. The current treatment provides significant improvement of lipids. Compliance problems include adherence to diet and adherence to exercise.  Risk factors for coronary artery disease include diabetes mellitus, hypertension and post-menopausal.  osteporosis Fosamax weekly without side effects- Very little weight bearing exercises. Gad Only takes alprazolam once in a while not daily.    Review of Systems  Constitutional: Negative for weight loss.  Eyes: Negative for blurred vision.  Respiratory: Negative for shortness of breath.   Cardiovascular: Negative for chest pain, palpitations and orthopnea.  Endocrine: Negative for polydipsia, polyphagia and polyuria.  Musculoskeletal: Positive for myalgias.  Neurological: Positive for focal weakness. Negative for weakness and headaches.  All other systems reviewed and are negative.      Objective:   Physical Exam  Constitutional: She is oriented to person, place, and time. She appears well-developed and well-nourished.  HENT:  Nose: Nose normal.  Mouth/Throat: Oropharynx is clear and moist.  Right cerumen impaction   Eyes: EOM are normal.  Neck:  Trachea normal, normal range of motion and full passive range of motion  without pain. Neck supple. No JVD present. Carotid bruit is not present. No thyromegaly present.  Cardiovascular: Normal rate, regular rhythm, normal heart sounds and intact distal pulses.  Exam reveals no gallop and no friction rub.   No murmur heard. Pulmonary/Chest: Effort normal and breath sounds normal.  Abdominal: Soft. Bowel sounds are normal. She exhibits no distension and no mass. There is no tenderness.  Musculoskeletal: Normal range of motion.  Lymphadenopathy:    She has no cervical adenopathy.  Neurological: She is alert and oriented to person, place, and time. She has normal reflexes.  Skin: Skin is warm and dry.  Psychiatric: She has a normal mood and affect. Her behavior is normal. Judgment and thought content normal.   BP 138/78  Pulse 74  Temp(Src) 98.3 F (36.8 C) (Oral)  Ht _0  (1.651 m)  Wt 170 lb (77.111 kg)  BMI 28.29 kg/m2  Results for orders placed in visit on 04/12/14  POCT GLYCOSYLATED HEMOGLOBIN (HGB A1C)      Result Value Ref Range   Hemoglobin A1C 6.7           Assessment & Plan:   1. Type 2 diabetes mellitus without complication   2. Essential hypertension   3. Hyperlipidemia   4. Osteoporosis    Orders Placed This Encounter  Procedures  . CMP14+EGFR  . NMR, lipoprofile  . POCT glycosylated hemoglobin (Hb A1C)   Meds ordered this encounter  Medications  . dexlansoprazole (DEXILANT) 60 MG capsule    Sig: Take 1 capsule (60 mg total) by mouth daily.    Dispense:  30 capsule    Refill:  5    Order Specific Question:  Supervising Provider    Answer:  Chipper Herb [[id number]]  . saxagliptin HCl (ONGLYZA) 5 MG TABS tablet    Sig: Take 1 tablet (5 mg total) by mouth daily.    Dispense:  90 tablet    Refill:  1    Order Specific Question:  Supervising Provider    Answer:  Chipper Herb [[id number]]  . LORazepam (ATIVAN) 0.5 MG tablet    Sig: TAKE ONE TABLET BY MOUTH TWICE  DAILY AS NEEDED    Dispense:  60 tablet    Refill:  0    Order Specific Question:  Supervising Provider    Answer:  Chipper Herb [[id number]]  . alendronate (FOSAMAX) 70 MG tablet    Sig: Take 1 tablet (70 mg total) by mouth once a week. Take with a full glass of water on an empty stomach.    Dispense:  12 tablet    Refill:  3    Order Specific Question:  Supervising Provider    Answer:  Chipper Herb [[id number]]    Labs pending Health maintenance reviewed Diet and exercise encouraged Continue all meds Follow up  In 3 months PRN   Mary-Margaret Hassell Done, FNP

## 2014-04-12 NOTE — Addendum Note (Signed)
Addended by: Shelbie Ammons on: 04/12/2014 09:44 AM   Modules accepted: Orders

## 2014-04-12 NOTE — Patient Instructions (Signed)
Diabetes and Exercise Exercising regularly is important. It is not just about losing weight. It has many health benefits, such as:  Improving your overall fitness, flexibility, and endurance.  Increasing your bone density.  Helping with weight control.  Decreasing your body fat.  Increasing your muscle strength.  Reducing stress and tension.  Improving your overall health. People with diabetes who exercise gain additional benefits because exercise:  Reduces appetite.  Improves the body's use of blood sugar (glucose).  Helps lower or control blood glucose.  Decreases blood pressure.  Helps control blood lipids (such as cholesterol and triglycerides).  Improves the body's use of the hormone insulin by:  Increasing the body's insulin sensitivity.  Reducing the body's insulin needs.  Decreases the risk for heart disease because exercising:  Lowers cholesterol and triglycerides levels.  Increases the levels of good cholesterol (such as high-density lipoproteins [HDL]) in the body.  Lowers blood glucose levels. YOUR ACTIVITY PLAN  Choose an activity that you enjoy and set realistic goals. Your health care provider or diabetes educator can help you make an activity plan that works for you. You can break activities into 2 or 3 sessions throughout the day. Doing so is as good as one long session. Exercise ideas include:  Taking the dog for a walk.  Taking the stairs instead of the elevator.  Dancing to your favorite song.  Doing your favorite exercise with a friend. RECOMMENDATIONS FOR EXERCISING WITH TYPE 1 OR TYPE 2 DIABETES   Check your blood glucose before exercising. If blood glucose levels are greater than 240 mg/dL, check for urine ketones. Do not exercise if ketones are present.  Avoid injecting insulin into areas of the body that are going to be exercised. For example, avoid injecting insulin into:  The arms when playing tennis.  The legs when  jogging.  Keep a record of:  Food intake before and after you exercise.  Expected peak times of insulin action.  Blood glucose levels before and after you exercise.  The type and amount of exercise you have done.  Review your records with your health care provider. Your health care provider will help you to develop guidelines for adjusting food intake and insulin amounts before and after exercising.  If you take insulin or oral hypoglycemic agents, watch for signs and symptoms of hypoglycemia. They include:  Dizziness.  Shaking.  Sweating.  Chills.  Confusion.  Drink plenty of water while you exercise to prevent dehydration or heat stroke. Body water is lost during exercise and must be replaced.  Talk to your health care provider before starting an exercise program to make sure it is safe for you. Remember, almost any type of activity is better than none. Document Released: 12/20/2003 Document Revised: 06/01/2013 Document Reviewed: 03/08/2013 Exit[employer] Patient Information 2015 [employer]. This information is not intended to replace advice given to you by your health care provider. Make sure you discuss any questions you have with your health care provider.  

## 2014-04-13 LAB — CMP14+EGFR
ALBUMIN: 4 g/dL (ref 3.5–4.8)
ALK PHOS: 71 IU/L (ref 39–117)
ALT: 18 IU/L (ref 0–32)
AST: 15 IU/L (ref 0–40)
Albumin/Globulin Ratio: 1.7 (ref 1.1–2.5)
BILIRUBIN TOTAL: 0.3 mg/dL (ref 0.0–1.2)
BUN / CREAT RATIO: 12 (ref 11–26)
BUN: 12 mg/dL (ref 8–27)
CO2: 25 mmol/L (ref 18–29)
Calcium: 8.6 mg/dL — ABNORMAL LOW (ref 8.7–10.3)
Chloride: 104 mmol/L (ref 97–108)
Creatinine, Ser: 1 mg/dL (ref 0.57–1.00)
GFR calc non Af Amer: 56 mL/min/{1.73_m2} — ABNORMAL LOW (ref >59)
GFR, EST AFRICAN AMERICAN: 65 mL/min/{1.73_m2} (ref >59)
GLUCOSE: 159 mg/dL — AB (ref 65–99)
Globulin, Total: 2.4 g/dL (ref 1.5–4.5)
POTASSIUM: 4.4 mmol/L (ref 3.5–5.2)
Sodium: 142 mmol/L (ref 134–144)
TOTAL PROTEIN: 6.4 g/dL (ref 6.0–8.5)

## 2014-04-13 LAB — NMR, LIPOPROFILE
CHOLESTEROL: 174 mg/dL (ref 100–199)
HDL Cholesterol by NMR: 43 mg/dL (ref >39)
HDL Particle Number: 28.3 umol/L — ABNORMAL LOW (ref >=30.5)
LDL Particle Number: 1173 nmol/L — ABNORMAL HIGH (ref <1000)
LDL Size: 20.6 nm (ref >20.5)
LDLC SERPL CALC-MCNC: 104 mg/dL — ABNORMAL HIGH (ref 0–99)
LP-IR Score: 52 — ABNORMAL HIGH (ref <=45)
Small LDL Particle Number: 635 nmol/L — ABNORMAL HIGH (ref <=527)
TRIGLYCERIDES BY NMR: 136 mg/dL (ref 0–149)

## 2014-06-08 ENCOUNTER — Ambulatory Visit: Payer: Medicare Other | Attending: Gynecologic Oncology | Admitting: Gynecologic Oncology

## 2014-06-08 ENCOUNTER — Encounter: Payer: Self-pay | Admitting: Gynecologic Oncology

## 2014-06-08 ENCOUNTER — Other Ambulatory Visit (HOSPITAL_COMMUNITY)
Admission: RE | Admit: 2014-06-08 | Discharge: 2014-06-08 | Disposition: A | Discharge status: Home / Self Care | Payer: Medicare Other | Source: Ambulatory Visit | Attending: Gynecologic Oncology | Admitting: Gynecologic Oncology

## 2014-06-08 VITALS — BP 122/54 | HR 75 | Temp 98.8°F | Resp 18 | Ht 65.0 in | Wt 170.5 lb

## 2014-06-08 DIAGNOSIS — Z880 Allergy status to penicillin: Secondary | ICD-10-CM | POA: Diagnosis not present

## 2014-06-08 DIAGNOSIS — Z888 Allergy status to other drugs, medicaments and biological substances status: Secondary | ICD-10-CM | POA: Insufficient documentation

## 2014-06-08 DIAGNOSIS — H269 Unspecified cataract: Secondary | ICD-10-CM | POA: Diagnosis not present

## 2014-06-08 DIAGNOSIS — Z8541 Personal history of malignant neoplasm of cervix uteri: Secondary | ICD-10-CM

## 2014-06-08 DIAGNOSIS — K219 Gastro-esophageal reflux disease without esophagitis: Secondary | ICD-10-CM | POA: Diagnosis not present

## 2014-06-08 DIAGNOSIS — E119 Type 2 diabetes mellitus without complications: Secondary | ICD-10-CM | POA: Insufficient documentation

## 2014-06-08 DIAGNOSIS — Z124 Encounter for screening for malignant neoplasm of cervix: Secondary | ICD-10-CM | POA: Diagnosis present

## 2014-06-08 DIAGNOSIS — Z8542 Personal history of malignant neoplasm of other parts of uterus: Secondary | ICD-10-CM | POA: Insufficient documentation

## 2014-06-08 DIAGNOSIS — F411 Generalized anxiety disorder: Secondary | ICD-10-CM | POA: Diagnosis not present

## 2014-06-08 DIAGNOSIS — Z90710 Acquired absence of both cervix and uterus: Secondary | ICD-10-CM | POA: Diagnosis not present

## 2014-06-08 DIAGNOSIS — Z79899 Other long term (current) drug therapy: Secondary | ICD-10-CM | POA: Insufficient documentation

## 2014-06-08 DIAGNOSIS — M810 Age-related osteoporosis without current pathological fracture: Secondary | ICD-10-CM | POA: Diagnosis not present

## 2014-06-08 DIAGNOSIS — E785 Hyperlipidemia, unspecified: Secondary | ICD-10-CM | POA: Diagnosis not present

## 2014-06-08 DIAGNOSIS — C541 Malignant neoplasm of endometrium: Secondary | ICD-10-CM

## 2014-06-08 NOTE — Patient Instructions (Signed)
Doing well.  Plan to follow up in one year or sooner if needed.  Please call for any questions or concerns.

## 2014-06-08 NOTE — Progress Notes (Signed)
Follow Up Note: [name] 73 y.o. female  CC:  Chief Complaint  Patient presents with  . Endometrial cancer    Follow up    HPI:  Catherine Cooper is a 73 year old female with a history of stage IA, grade 1 endometrioid adenocarcinoma.  She underwent a robotic hysterectomy, BSO, and bilateral pelvic lymph node dissection in March 2011.  Final pathology revealed a grade 1 endometrioid adenocarcinoma with 10% myometrial invasion, negative lymph nodes, negative adnexa, no lymphovascular space involvement, and negative washings.  She was last seen in August of 2014 with an unremarkable exam and normal pap smear.     Interval History:  Catherine Cooper presents today for continued follow up.  She enjoys visiting with her great grandson.  She voices no complaints since her last visit.  She reports improvement with her left foot and Charcot arthropathy except for an episode of nerve pain along her arch on the left foot that is relieved with gabapentin intermittently.  She continues to have a dry cough with no sputum production and states "I think it is just me" since testing has been negative. She continues to describe it as a "tickle" in her throat that catches her and makes her begin coughing.  She reports her blood glucose as being elevated over the past several months, reading 150 this am.  Her A1C remains around 5.4.  No abdominal symptoms or vaginal bleeding reported.  See below for detailed review of systems.  No other concerns voiced.    Review of Systems Constitutional: Feels well.  No fever, chills, unintentional weight gain or loss, early satiety, headaches, or visual changes.  Cardiovascular: No chest pain, shortness of breath, or edema.  Pulmonary: Continued dry cough with no sputum production.  No wheeze.  No difficulty swallowing.  Gastrointestinal: No nausea, vomiting, or diarrhea. No bright red blood per rectum or change in bowel movement.  Genitourinary: No frequency, urgency,  hematuria, or dysuria. No vaginal bleeding or discharge.  Musculoskeletal: No myalgia or joint pain.  Intermittent left foot discomfort that resolves after a few seconds. Neurologic: No weakness, numbness, or change in gait.  Psychology: No depression, anxiety, or insomnia.  Health Maintenance: Mammogram: Up to date Pap Smear: 05/2014 Colonoscopy: 2013  Current Meds:  Outpatient Encounter Prescriptions as of 06/08/2014  Medication Sig  . ADVOCATE LANCETS MISC   . Alcohol Swabs (ALCOHOL PADS) 70 % PADS   . alendronate (FOSAMAX) 70 MG tablet Take 1 tablet (70 mg total) by mouth once a week. Take with a full glass of water on an empty stomach.  Marland Kitchen aspirin 81 MG tablet Take 81 mg by mouth daily.    . Blood Glucose Calibration (TAI DOC CONTROL) NORMAL SOLN   . brimonidine (ALPHAGAN P) 0.1 % SOLN Place 1 drop into both eyes 2 (two) times daily.    . Cholecalciferol (VITAMIN D3) 2000 UNITS TABS Take by mouth.    Marland Kitchen CLEVER CHEK AUTO-CODE VOICE test strip   . dexlansoprazole (DEXILANT) 60 MG capsule Take 1 capsule (60 mg total) by mouth daily.  . ferrous gluconate (FERGON) 325 MG tablet Take 325 mg by mouth at bedtime.  . gabapentin (NEURONTIN) 100 MG capsule   . glipiZIDE (GLIPIZIDE XL) 10 MG 24 hr tablet Take 1 tablet (10 mg total) by mouth daily with breakfast.  . ibuprofen (ADVIL,MOTRIN) 800 MG tablet Take 1 tablet (800 mg total) by mouth every 6 (six) hours as needed for pain.  Marland Kitchen ketotifen (ALAWAY) 0.025 %  ophthalmic solution 1 drop 2 (two) times daily.    Marland Kitchen L-Methylfolate-B6-B12 (METANX) 3-35-2 MG TABS Take 1 tablet by mouth daily.   Elmore Guise Devices (ADVOCATE LANCING DEVICE) MISC   . loperamide (IMODIUM A-D) 2 MG tablet Take 2 mg by mouth 4 (four) times daily as needed.    Marland Kitchen LORazepam (ATIVAN) 0.5 MG tablet TAKE ONE TABLET BY MOUTH TWICE DAILY AS NEEDED  . LOTEMAX 0.5 % GEL   . Multiple Vitamin (MULTI-VITAMIN DAILY PO) Take by mouth. Century Mature for adults 67+  . ofloxacin (OCUFLOX)  0.3 % ophthalmic solution   . saxagliptin HCl (ONGLYZA) 5 MG TABS tablet Take 1 tablet (5 mg total) by mouth daily.  . vitamin B-12 (CYANOCOBALAMIN) 1000 MCG tablet Take 1,000 mcg by mouth daily.      Allergy:  Allergies  Allergen Reactions  . Lipitor [Atorvastatin]     fatique and myalgia  . Penicillins Rash    Social Hx:   History   Social History  . Marital Status: Married    Spouse Name: N/A    Number of Children: 3  . Years of Education: N/A   Occupational History  . retired     [employer]   Social History Main Topics  . Smoking status: Never Smoker   . Smokeless tobacco: Not on file  . Alcohol Use: No  . Drug Use: No  . Sexual Activity: Not on file   Other Topics Concern  . Not on file   Social History Narrative   2 healthy daughters and 1 son deceased (suicide @ 24)    Past Surgical Hx:  Past Surgical History  Procedure Laterality Date  .  vitrectomy  02/23/08    posterior; right eye  . Tubal ligation    . Abdominal hysterectomy    . Foot surgery      Right     Past Medical Hx:  Past Medical History  Diagnosis Date  . Diabetes mellitus     type II  . Osteoporosis   . Glaucoma   . GAD (generalized anxiety disorder)   . GERD (gastroesophageal reflux disease)   . Hyperlipidemia   . Endometrioid adenocarcinoma   . 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 Shingles 2004  . Nephrolithiasis   . Cataracts, bilateral     Family Hx:  Family History  Problem Relation Age of Onset  . Dementia Father   . Diabetes Father   . Coronary artery disease Father 48  . Atrial fibrillation Mother     Vitals:  Blood pressure 122/54, pulse 75, temperature 98.8 F (37.1 C), temperature source Oral, resp. rate 18, height 5' 5"  (1.651 m), weight 170 lb 8 oz (77.338 kg).  Physical Exam:  General: Well developed, well nourished female in no acute distress. Alert and oriented x 3.  Head/ Neck: Supple  without any enlargements.  Oropharynx clear without lesions.  Sclerae anicteric.  Lymph node survey: No cervical, supraclavicular, or inguinal adenopathy.  Cardiovascular: Regular rate and rhythm. S1 and S2 normal.  Lungs: Clear to auscultation bilaterally. No wheezes/crackles/rhonchi noted.  Skin: No rashes or lesions present. Back: No CVA tenderness.  Abdomen: Abdomen soft, non-tender and obese. Active bowel sounds in all quadrants. No evidence of a fluid wave or abdominal masses.  Genitourinary:    Vulva/vagina: Normal external female genitalia. No lesions.    Urethra: No lesions or masses.    Vagina: Atrophic without  any lesions. No palpable masses. No vaginal bleeding or drainage noted.  Thin                  Prep Pap obtained.   Rectal: Good tone, no masses, no cul de sac nodularity.  Extremities: No bilateral cyanosis, edema, or clubbing.    Assessment/Plan:  Catherine Cooper is a 73 year old with stage IA, grade 1 endometrioid adenocarcinoma who clinically has no evidence of recurrent disease. She is over 4 years out from her diagnosis in March 2011.  She will return to see [place] in 6 months or sooner if needed. We will contact her with the results of her pap smear from today.  She will follow up with her other physicians as scheduled.  She is instructed to please call the office for any questions or concerns.   Reportable signs and symptoms reviewed.  She states she still has her information on reportable signs and symptoms at home.     CROSS, MELISSA DEAL, NP 06/08/2014, 4:05 PM

## 2014-06-09 ENCOUNTER — Telehealth: Payer: Self-pay | Admitting: *Deleted

## 2014-06-09 NOTE — Telephone Encounter (Signed)
error 

## 2014-06-12 ENCOUNTER — Telehealth: Payer: Self-pay | Admitting: Gynecologic Oncology

## 2014-06-12 LAB — CYTOLOGY - PAP

## 2014-06-12 NOTE — Telephone Encounter (Signed)
Message left for patient with pap smear results: negative.  Instructed to call for any questions or concerns.  

## 2014-07-20 ENCOUNTER — Encounter: Payer: Self-pay | Admitting: Nurse Practitioner

## 2014-07-20 ENCOUNTER — Ambulatory Visit (INDEPENDENT_AMBULATORY_CARE_PROVIDER_SITE_OTHER): Payer: Medicare Other | Admitting: Nurse Practitioner

## 2014-07-20 VITALS — BP 102/55 | HR 90 | Temp 98.7°F | Ht 65.0 in | Wt 170.6 lb

## 2014-07-20 DIAGNOSIS — I10 Essential (primary) hypertension: Secondary | ICD-10-CM

## 2014-07-20 DIAGNOSIS — Z6828 Body mass index (BMI) 28.0-28.9, adult: Secondary | ICD-10-CM

## 2014-07-20 DIAGNOSIS — E785 Hyperlipidemia, unspecified: Secondary | ICD-10-CM

## 2014-07-20 DIAGNOSIS — Z13820 Encounter for screening for osteoporosis: Secondary | ICD-10-CM

## 2014-07-20 DIAGNOSIS — E119 Type 2 diabetes mellitus without complications: Secondary | ICD-10-CM

## 2014-07-20 DIAGNOSIS — Z23 Encounter for immunization: Secondary | ICD-10-CM

## 2014-07-20 LAB — POCT GLYCOSYLATED HEMOGLOBIN (HGB A1C): Hemoglobin A1C: 6.8

## 2014-07-20 LAB — POCT UA - MICROALBUMIN: MICROALBUMIN (UR) POC: NEGATIVE mg/L

## 2014-07-20 NOTE — Progress Notes (Signed)
Subjective:    Patient ID: Catherine Cooper, female    DOB: Sep 13, 1941, 73 y.o.   MRN: [id number]  Patient here today for follow- up- no changes since last visit. No complaints today.  Diabetes She presents for her follow-up diabetic visit. She has type 2 diabetes mellitus. MedicAlert identification noted. The initial diagnosis of diabetes was made 8 years ago. Her disease course has been improving. There are no hypoglycemic associated symptoms. Pertinent negatives for hypoglycemia include no headaches or sweats. Pertinent negatives for diabetes include no blurred vision, no chest pain, no polydipsia, no polyphagia, no polyuria, no weakness and no weight loss. There are no hypoglycemic complications. Symptoms are stable. There are no diabetic complications. Risk factors for coronary artery disease include dyslipidemia, hypertension, post-menopausal and stress. Current diabetic treatment includes oral agent (dual therapy). She is compliant with treatment all of the time. Her weight is stable. She is following a generally healthy and diabetic diet. When asked about meal planning, she reported none. She has not had a previous visit with a dietician. She rarely participates in exercise. Her home blood glucose trend is fluctuating minimally. Her breakfast blood glucose is taken between 8-9 am. Her breakfast blood glucose range is generally 140-180 mg/dl. Her overall blood glucose range is 140-180 mg/dl. An ACE inhibitor/angiotensin II receptor blocker is not being taken. She does not see a podiatrist.Eye exam is current (6 months ago- has appointment next week.).  Hypertension This is a chronic problem. The current episode started more than 1 year ago. The problem has been resolved since onset. The problem is controlled. Pertinent negatives include no blurred vision, chest pain, headaches, orthopnea, palpitations, peripheral edema, shortness of breath or sweats. There are no associated agents to hypertension.  Risk factors for coronary artery disease include diabetes mellitus, dyslipidemia, obesity and post-menopausal state. Past treatments include nothing. The current treatment provides significant improvement. Compliance problems include diet and exercise.   Hyperlipidemia This is a chronic problem. The current episode started more than 1 year ago. The problem is controlled. Recent lipid tests were reviewed and are normal. There are no known factors aggravating her hyperlipidemia. Associated symptoms include a focal sensory loss, a focal weakness, leg pain and myalgias. Pertinent negatives include no chest pain or shortness of breath. (Patient was on lipitor for 2 years and was just feeling awful.stopped taking 4 weeks ago and now feels 100 X better.) Current antihyperlipidemic treatment includes statins. The current treatment provides significant improvement of lipids. Compliance problems include adherence to diet and adherence to exercise.  Risk factors for coronary artery disease include diabetes mellitus, hypertension and post-menopausal.  osteporosis Fosamax weekly without side effects- Very little weight bearing exercises. Gad Only takes alprazolam once in a while not daily.    Review of Systems  Constitutional: Negative for weight loss.  Eyes: Negative for blurred vision.  Respiratory: Negative for shortness of breath.   Cardiovascular: Negative for chest pain, palpitations and orthopnea.  Endocrine: Negative for polydipsia, polyphagia and polyuria.  Musculoskeletal: Positive for myalgias.  Neurological: Positive for focal weakness. Negative for weakness and headaches.  All other systems reviewed and are negative.      Objective:   Physical Exam  Constitutional: She is oriented to person, place, and time. She appears well-developed and well-nourished.  HENT:  Nose: Nose normal.  Mouth/Throat: Oropharynx is clear and moist.  Right cerumen impaction   Eyes: EOM are normal.  Neck:  Trachea normal, normal range of motion and full passive range of motion  without pain. Neck supple. No JVD present. Carotid bruit is not present. No thyromegaly present.  Cardiovascular: Normal rate, regular rhythm, normal heart sounds and intact distal pulses.  Exam reveals no gallop and no friction rub.   No murmur heard. Pulmonary/Chest: Effort normal and breath sounds normal.  Abdominal: Soft. Bowel sounds are normal. She exhibits no distension and no mass. There is no tenderness.  Musculoskeletal: Normal range of motion.  Lymphadenopathy:    She has no cervical adenopathy.  Neurological: She is alert and oriented to person, place, and time. She has normal reflexes.  Skin: Skin is warm and dry.  Psychiatric: She has a normal mood and affect. Her behavior is normal. Judgment and thought content normal.   BP 102/55  Pulse 90  Temp(Src) 98.7 F (37.1 C) (Oral)  Ht 5' 5" (1.651 m)  Wt 170 lb 9.6 oz (77.384 kg)  BMI 28.39 kg/m2  Results for orders placed in visit on 07/20/14  POCT GLYCOSYLATED HEMOGLOBIN (HGB A1C)      Result Value Ref Range   Hemoglobin A1C 6.8           Assessment & Plan:     1. Type 2 diabetes mellitus without complication Low carb diet - POCT glycosylated hemoglobin (Hb A1C) - POCT UA - Microalbumin  2. Hyperlipidemia Watch fats in diet - NMR, lipoprofile  3. Essential hypertension Low NA+ - CMP14+EGFR  4. Screening for osteoporosis Weight bearing exercises - DG Bone Density; Future  5. BMI 28.0-28.9,adult Discussed diet and exercise for person with BMI >25 Will recheck weight in 3-6 months  Labs pending Health maintenance reviewed Diet and exercise encouraged Continue all meds Follow up  In 3 month   [place], FNP

## 2014-07-20 NOTE — Patient Instructions (Signed)

## 2014-07-21 LAB — CMP14+EGFR
A/G RATIO: 1.6 (ref 1.1–2.5)
ALK PHOS: 82 IU/L (ref 39–117)
ALT: 24 IU/L (ref 0–32)
AST: 19 IU/L (ref 0–40)
Albumin: 4.2 g/dL (ref 3.5–4.8)
BILIRUBIN TOTAL: 0.5 mg/dL (ref 0.0–1.2)
BUN / CREAT RATIO: 16 (ref 11–26)
BUN: 16 mg/dL (ref 8–27)
CHLORIDE: 100 mmol/L (ref 97–108)
CO2: 25 mmol/L (ref 18–29)
Calcium: 9.1 mg/dL (ref 8.7–10.3)
Creatinine, Ser: 1 mg/dL (ref 0.57–1.00)
GFR, EST AFRICAN AMERICAN: 64 mL/min/{1.73_m2} (ref >59)
GFR, EST NON AFRICAN AMERICAN: 56 mL/min/{1.73_m2} — AB (ref >59)
Globulin, Total: 2.6 g/dL (ref 1.5–4.5)
Glucose: 186 mg/dL — ABNORMAL HIGH (ref 65–99)
Potassium: 4.2 mmol/L (ref 3.5–5.2)
Sodium: 141 mmol/L (ref 134–144)
TOTAL PROTEIN: 6.8 g/dL (ref 6.0–8.5)

## 2014-07-21 LAB — NMR, LIPOPROFILE
Cholesterol: 190 mg/dL (ref 100–199)
HDL Cholesterol by NMR: 39 mg/dL — ABNORMAL LOW (ref >39)
HDL Particle Number: 27.3 umol/L — ABNORMAL LOW (ref >=30.5)
LDL PARTICLE NUMBER: 1258 nmol/L — AB (ref <1000)
LDL SIZE: 20.1 nm (ref >20.5)
LDLC SERPL CALC-MCNC: 106 mg/dL — ABNORMAL HIGH (ref 0–99)
LP-IR SCORE: 59 — AB (ref <=45)
SMALL LDL PARTICLE NUMBER: 778 nmol/L — AB (ref <=527)
TRIGLYCERIDES BY NMR: 227 mg/dL — AB (ref 0–149)

## 2014-08-18 ENCOUNTER — Other Ambulatory Visit: Payer: Self-pay | Admitting: Nurse Practitioner

## 2014-08-28 LAB — HM DIABETES EYE EXAM

## 2014-09-05 ENCOUNTER — Encounter: Payer: Self-pay | Admitting: *Deleted

## 2014-09-21 LAB — HM DIABETES EYE EXAM

## 2014-10-11 ENCOUNTER — Encounter: Payer: Self-pay | Admitting: *Deleted

## 2014-10-14 DIAGNOSIS — H401222 Low-tension glaucoma, left eye, moderate stage: Secondary | ICD-10-CM | POA: Diagnosis not present

## 2014-10-21 ENCOUNTER — Other Ambulatory Visit: Payer: Self-pay | Admitting: Nurse Practitioner

## 2014-10-25 ENCOUNTER — Ambulatory Visit (INDEPENDENT_AMBULATORY_CARE_PROVIDER_SITE_OTHER): Payer: Medicare Other | Admitting: Nurse Practitioner

## 2014-10-25 ENCOUNTER — Encounter: Payer: Self-pay | Admitting: Nurse Practitioner

## 2014-10-25 DIAGNOSIS — F411 Generalized anxiety disorder: Secondary | ICD-10-CM | POA: Diagnosis not present

## 2014-10-25 DIAGNOSIS — E1142 Type 2 diabetes mellitus with diabetic polyneuropathy: Secondary | ICD-10-CM

## 2014-10-25 DIAGNOSIS — M810 Age-related osteoporosis without current pathological fracture: Secondary | ICD-10-CM | POA: Diagnosis not present

## 2014-10-25 DIAGNOSIS — E785 Hyperlipidemia, unspecified: Secondary | ICD-10-CM

## 2014-10-25 DIAGNOSIS — K219 Gastro-esophageal reflux disease without esophagitis: Secondary | ICD-10-CM

## 2014-10-25 DIAGNOSIS — E1140 Type 2 diabetes mellitus with diabetic neuropathy, unspecified: Secondary | ICD-10-CM | POA: Diagnosis not present

## 2014-10-25 DIAGNOSIS — I10 Essential (primary) hypertension: Secondary | ICD-10-CM | POA: Diagnosis not present

## 2014-10-25 LAB — POCT GLYCOSYLATED HEMOGLOBIN (HGB A1C): Hemoglobin A1C: 6.7

## 2014-10-25 MED ORDER — SAXAGLIPTIN HCL 5 MG PO TABS: 5.0000 mg | ORAL_TABLET | Freq: Every day | ORAL | Status: DC

## 2014-10-25 MED ORDER — GLIPIZIDE ER 10 MG PO TB24: 10.0000 mg | ORAL_TABLET | Freq: Every day | ORAL | Status: DC

## 2014-10-25 MED ORDER — DEXLANSOPRAZOLE 60 MG PO CPDR: 60.0000 mg | DELAYED_RELEASE_CAPSULE | Freq: Every day | ORAL | Status: DC

## 2014-10-25 MED ORDER — GABAPENTIN 100 MG PO CAPS: 100.0000 mg | ORAL_CAPSULE | Freq: Every day | ORAL | Status: DC

## 2014-10-25 MED ORDER — ALENDRONATE SODIUM 70 MG PO TABS: 70.0000 mg | ORAL_TABLET | ORAL | Status: DC

## 2014-10-25 MED ORDER — LORAZEPAM 0.5 MG PO TABS: ORAL_TABLET | ORAL | Status: DC

## 2014-10-25 NOTE — Progress Notes (Signed)
Subjective:    Patient ID: Catherine Cooper, female    DOB: 06/07/1941, 74 y.o.   MRN: [id number]  Patient here today for follow- up- no changes since last visit. Her only complaint today is fatigue- just little energy  To do anything.  Diabetes She presents for her follow-up diabetic visit. She has type 2 diabetes mellitus. No MedicAlert identification noted. Her disease course has been stable. Pertinent negatives for hypoglycemia include no headaches. Pertinent negatives for diabetes include no chest pain, no polydipsia, no polyphagia, no polyuria and no weakness. There are no hypoglycemic complications. There are no diabetic complications. Risk factors for coronary artery disease include dyslipidemia, hypertension and post-menopausal. Current diabetic treatment includes oral agent (dual therapy). She is compliant with treatment most of the time. Her weight is stable. When asked about meal planning, she reported none. She has not had a previous visit with a dietitian. She rarely participates in exercise. Her breakfast blood glucose is taken between 8-9 am. Her breakfast blood glucose range is generally 110-130 mg/dl. Her overall blood glucose range is 110-130 mg/dl. An ACE inhibitor/angiotensin II receptor blocker is not being taken. She does not see a podiatrist.Eye exam is not current.  Hypertension This is a chronic problem. The problem is unchanged. The problem is controlled. Pertinent negatives include no chest pain, headaches, palpitations or shortness of breath. Risk factors for coronary artery disease include dyslipidemia, diabetes mellitus, post-menopausal state and sedentary lifestyle. Past treatments include nothing. The current treatment provides moderate improvement. Compliance problems include diet and exercise.   Hyperlipidemia This is a chronic problem. The current episode started more than 1 year ago. The problem is uncontrolled. Recent lipid tests were reviewed and are variable.  Associated symptoms include myalgias. Pertinent negatives include no chest pain or shortness of breath. Current antihyperlipidemic treatment includes diet change. The current treatment provides mild improvement of lipids. Compliance problems include adherence to diet and adherence to exercise.  Risk factors for coronary artery disease include diabetes mellitus, dyslipidemia, hypertension and post-menopausal.  osteporosis Fosamax weekly without side effects- Very little weight bearing exercises. Gad Only takes alprazolam once in a while not daily.    Review of Systems  Constitutional: Negative.   HENT: Negative.   Respiratory: Negative for shortness of breath.   Cardiovascular: Negative for chest pain and palpitations.  Endocrine: Negative for polydipsia, polyphagia and polyuria.  Musculoskeletal: Positive for myalgias.  Neurological: Negative for weakness and headaches.  Psychiatric/Behavioral: Negative.   All other systems reviewed and are negative.      Objective:   Physical Exam  Constitutional: She is oriented to person, place, and time. She appears well-developed and well-nourished.  HENT:  Nose: Nose normal.  Mouth/Throat: Oropharynx is clear and moist.  Right cerumen impaction   Eyes: EOM are normal.  Neck: Trachea normal, normal range of motion and full passive range of motion without pain. Neck supple. No JVD present. Carotid bruit is not present. No thyromegaly present.  Cardiovascular: Normal rate, regular rhythm, normal heart sounds and intact distal pulses.  Exam reveals no gallop and no friction rub.   No murmur heard. Pulmonary/Chest: Effort normal and breath sounds normal.  Abdominal: Soft. Bowel sounds are normal. She exhibits no distension and no mass. There is no tenderness.  Musculoskeletal: Normal range of motion.  Lymphadenopathy:    She has no cervical adenopathy.  Neurological: She is alert and oriented to person, place, and time. She has normal reflexes.   Skin: Skin is warm and dry.  Psychiatric: She has a normal mood and affect. Her behavior is normal. Judgment and thought content normal.   BP 121/61 mmHg  Pulse 94  Temp(Src) 97.7 F (36.5 C) (Oral)  Ht _0  (1.651 m)  Wt 172 lb (78.019 kg)  BMI 28.62 kg/m2  Results for orders placed or performed in visit on 10/25/14  POCT glycosylated hemoglobin (Hb A1C)  Result Value Ref Range   Hemoglobin A1C 6.7%           Assessment & Plan:   1. Hyperlipidemia Low fat diet - NMR, lipoprofile  2. Essential hypertension Do not add salt to diet - CMP14+EGFR  3. Type 2 diabetes mellitus with diabetic polyneuropathy Continue to count carbs - POCT glycosylated hemoglobin (Hb A1C) - gabapentin (NEURONTIN) 100 MG capsule; Take 1 capsule (100 mg total) by mouth daily.  Dispense: 30 capsule; Refill: 5 - glipiZIDE (GLIPIZIDE XL) 10 MG 24 hr tablet; Take 1 tablet (10 mg total) by mouth daily with breakfast.  Dispense: 90 tablet; Refill: 1 - saxagliptin HCl (ONGLYZA) 5 MG TABS tablet; Take 1 tablet (5 mg total) by mouth daily.  Dispense: 30 tablet; Refill: 5  4. Osteoporosis Weight bearing exercises - alendronate (FOSAMAX) 70 MG tablet; Take 1 tablet (70 mg total) by mouth once a week. Take with a full glass of water on an empty stomach.  Dispense: 12 tablet; Refill: 3  5. GAD (generalized anxiety disorder) Stress management - LORazepam (ATIVAN) 0.5 MG tablet; TAKE ONE TABLET BY MOUTH TWICE DAILY AS NEEDED  Dispense: 60 tablet; Refill: 0  6. Gastroesophageal reflux disease without esophagitis Watch spicy foods - dexlansoprazole (DEXILANT) 60 MG capsule; Take 1 capsule (60 mg total) by mouth daily.  Dispense: 30 capsule; Refill: 5    Labs pending Health maintenance reviewed Diet and exercise encouraged Continue all meds Follow up  In 3 months    [place], FNP

## 2014-10-25 NOTE — Patient Instructions (Signed)

## 2014-10-25 NOTE — Addendum Note (Signed)
Addended by: Chevis Pretty on: 10/25/2014 03:36 PM   Modules accepted: Orders

## 2014-10-26 LAB — ANEMIA PROFILE B
Basophils Absolute: 0 10*3/uL (ref 0.0–0.2)
Basos: 1 %
EOS: 5 %
Eosinophils Absolute: 0.3 10*3/uL (ref 0.0–0.4)
HEMATOCRIT: 36.9 % (ref 34.0–46.6)
Hemoglobin: 12 g/dL (ref 11.1–15.9)
Immature Grans (Abs): 0 10*3/uL (ref 0.0–0.1)
Immature Granulocytes: 0 %
LYMPHS: 25 %
Lymphocytes Absolute: 1.8 10*3/uL (ref 0.7–3.1)
MCH: 31.5 pg (ref 26.6–33.0)
MCHC: 32.5 g/dL (ref 31.5–35.7)
MCV: 97 fL (ref 79–97)
MONOS ABS: 0.6 10*3/uL (ref 0.1–0.9)
Monocytes: 8 %
NEUTROS PCT: 61 %
Neutrophils Absolute: 4.4 10*3/uL (ref 1.4–7.0)
Platelets: 187 10*3/uL (ref 150–379)
RBC: 3.81 x10E6/uL (ref 3.77–5.28)
RDW: 12.8 % (ref 12.3–15.4)
Retic Ct Pct: 1.4 % (ref 0.6–2.6)
WBC: 7.2 10*3/uL (ref 3.4–10.8)

## 2014-10-26 LAB — NMR, LIPOPROFILE
Cholesterol: 180 mg/dL (ref 100–199)
HDL CHOLESTEROL BY NMR: 53 mg/dL (ref >39)
HDL Particle Number: 31 umol/L (ref >=30.5)
LDL PARTICLE NUMBER: 1187 nmol/L — AB (ref <1000)
LDL Size: 20.7 nm (ref >20.5)
LDL-C: 99 mg/dL (ref 0–99)
LP-IR Score: 46 — ABNORMAL HIGH (ref <=45)
Small LDL Particle Number: 606 nmol/L — ABNORMAL HIGH (ref <=527)
Triglycerides by NMR: 139 mg/dL (ref 0–149)

## 2014-10-26 LAB — CMP14+EGFR
ALT: 27 IU/L (ref 0–32)
AST: 20 IU/L (ref 0–40)
Albumin/Globulin Ratio: 1.4 (ref 1.1–2.5)
Albumin: 3.9 g/dL (ref 3.5–4.8)
Alkaline Phosphatase: 99 IU/L (ref 39–117)
BUN/Creatinine Ratio: 12 (ref 11–26)
BUN: 11 mg/dL (ref 8–27)
CO2: 27 mmol/L (ref 18–29)
Calcium: 8.8 mg/dL (ref 8.7–10.3)
Chloride: 103 mmol/L (ref 97–108)
Creatinine, Ser: 0.93 mg/dL (ref 0.57–1.00)
GFR calc Af Amer: 70 mL/min/1.73 (ref >59)
GFR calc non Af Amer: 61 mL/min/1.73 (ref >59)
Globulin, Total: 2.7 g/dL (ref 1.5–4.5)
Glucose: 185 mg/dL — ABNORMAL HIGH (ref 65–99)
Potassium: 4.5 mmol/L (ref 3.5–5.2)
Sodium: 141 mmol/L (ref 134–144)
Total Bilirubin: 0.6 mg/dL (ref 0.0–1.2)
Total Protein: 6.6 g/dL (ref 6.0–8.5)

## 2014-10-28 LAB — B12 AND FOLATE PANEL: Folate: >19.9 ng/mL (ref >3.0)

## 2014-10-28 LAB — IRON AND TIBC
IRON SATURATION: 32 % (ref 15–55)
IRON: 78 ug/dL (ref 35–155)
TIBC: 243 ug/dL — ABNORMAL LOW (ref 250–450)
UIBC: 165 ug/dL (ref 150–375)

## 2014-10-28 LAB — SPECIMEN STATUS REPORT

## 2014-10-28 LAB — FERRITIN: FERRITIN: 277 ng/mL — AB (ref 15–150)

## 2014-11-01 ENCOUNTER — Ambulatory Visit (INDEPENDENT_AMBULATORY_CARE_PROVIDER_SITE_OTHER): Payer: Medicare Other | Admitting: Pharmacist

## 2014-11-01 ENCOUNTER — Ambulatory Visit (INDEPENDENT_AMBULATORY_CARE_PROVIDER_SITE_OTHER): Payer: Medicare Other

## 2014-11-01 ENCOUNTER — Encounter: Payer: Self-pay | Admitting: Pharmacist

## 2014-11-01 VITALS — Ht 65.0 in | Wt 171.5 lb

## 2014-11-01 DIAGNOSIS — Z13820 Encounter for screening for osteoporosis: Secondary | ICD-10-CM | POA: Diagnosis not present

## 2014-11-01 DIAGNOSIS — M810 Age-related osteoporosis without current pathological fracture: Secondary | ICD-10-CM | POA: Diagnosis not present

## 2014-11-01 LAB — HM DEXA SCAN

## 2014-11-01 NOTE — Progress Notes (Signed)
Patient ID: Catherine Cooper, female   DOB: 01/08/1941, 74 y.o.   MRN: [id number]   [facility] Current Height: Height: 5\' 5"  (165.1 cm)      Max Lifetime Height:  5' 5.5" Current Weight: Weight: 171 lb 8 oz (77.792 kg)       Ethnicity:Caucasian   HPI: Patient here today to recheck DEXA.  Has diagnosis of osteoporosis and has been taking alendronate for the lsst 8 years with improved BMD.  She is concerned about the alendronate because her dentist told her that it can have side effects such as broken bones.  Back Pain?  No       Kyphosis?  No Prior fracture?  Yes - toe Med(s) for Osteoporosis/Osteopenia:  alendronate 70mg  1 tablet weekly on fridays Med(s) previously tried for Osteoporosis/Osteopenia:  none                                                             PMH: Age at menopause:  28's Hysterectomy?  No Oophorectomy?  No HRT? No Steroid Use?  No Thyroid med?  No History of cancer?  No History of digestive disorders (ie Crohn's)?  Yes - GERD on chronic PPI Current or previous eating disorders?  No Last Vitamin D Result:  44  (02/2012) Last GFR Result:  61 (10/25/2014)   FH/SH: Family history of osteoporosis?  Yes - mother Parent with history of hip fracture?  Yes - father Family history of breast cancer?  No Exercise?  No - limited by severe neuropathy and Charcot's foot Smoking?  No Alcohol?  No    Calcium Assessment Calcium Intake  # of servings/day  Calcium mg  Milk (8 oz) 0.5  x  300  = 150mg   Yogurt (4 oz) 0 x  200 = 0  Cheese (1 oz) 0 x  200 = 0  Other Calcium sources   250mg   Ca supplement Calcium 400mg  daily and MVI 400mg  daily = 800mg    Estimated calcium intake per day 1200mg     DEXA Results Date of Test T-Score for AP Spine L1-L4 T-Score for Total Left Hip T-Score for Total Right Hip  11/01/2014 -1.2 -2.4 -2.6  12/17/2011 -1.6 -2.7 -2.8  08/08/2009 -1.8 -2.7 -2.8  02/22/2007 -2.6 -2.9 -3.0    Assessment: Osteoporosis with improved BMD -  has been on bisphosphonate for almost 10 years  Recommendations: 1.  Stop alendronate  2.  recommend calcium 1200mg  daily through supplementation or diet.  3.  recommend weight bearing exercise - 30 minutes at least 4 days per week as able and if OK's with podiatrist 4.  Counseled and educated about fall risk and prevention.  Recheck DEXA:  1 year - due to discontinuing alendronate  Time spent counseling patient:  20 minutes   Cherre Robins, PharmD, CPP

## 2014-11-01 NOTE — Patient Instructions (Signed)
Fall Prevention and Home Safety Falls cause injuries and can affect all age groups. It is possible to use preventive measures to significantly decrease the likelihood of falls. There are many simple measures which can make your home safer and prevent falls. OUTDOORS  Repair cracks and edges of walkways and driveways.  Remove high doorway thresholds.  Trim shrubbery on the main path into your home.  Have good outside lighting.  Clear walkways of tools, rocks, debris, and clutter.  Check that handrails are not broken and are securely fastened. Both sides of steps should have handrails.  Have leaves, snow, and ice cleared regularly.  Use sand or salt on walkways during winter months.  In the garage, clean up grease or oil spills. BATHROOM  Install night lights.  Install grab bars by the toilet and in the tub and shower.  Use non-skid mats or decals in the tub or shower.  Place a plastic non-slip stool in the shower to sit on, if needed.  Keep floors dry and clean up all water on the floor immediately.  Remove soap buildup in the tub or shower on a regular basis.  Secure bath mats with non-slip, double-sided rug tape.  Remove throw rugs and tripping hazards from the floors. BEDROOMS  Install night lights.  Make sure a bedside light is easy to reach.  Do not use oversized bedding.  Keep a telephone by your bedside.  Have a firm chair with side arms to use for getting dressed.  Remove throw rugs and tripping hazards from the floor. KITCHEN  Keep handles on pots and pans turned toward the center of the stove. Use back burners when possible.  Clean up spills quickly and allow time for drying.  Avoid walking on wet floors.  Avoid hot utensils and knives.  Position shelves so they are not too high or low.  Place commonly used objects within easy reach.  If necessary, use a sturdy step stool with a grab bar when reaching.  Keep electrical cables out of the  way.  Do not use floor polish or wax that makes floors slippery. If you must use wax, use non-skid floor wax.  Remove throw rugs and tripping hazards from the floor. STAIRWAYS  Never leave objects on stairs.  Place handrails on both sides of stairways and use them. Fix any loose handrails. Make sure handrails on both sides of the stairways are as long as the stairs.  Check carpeting to make sure it is firmly attached along stairs. Make repairs to worn or loose carpet promptly.  Avoid placing throw rugs at the top or bottom of stairways, or properly secure the rug with carpet tape to prevent slippage. Get rid of throw rugs, if possible.  Have an electrician put in a light switch at the top and bottom of the stairs. OTHER FALL PREVENTION TIPS  Wear low-heel or rubber-soled shoes that are supportive and fit well. Wear closed toe shoes.  When using a stepladder, make sure it is fully opened and both spreaders are firmly locked. Do not climb a closed stepladder.  Add color or contrast paint or tape to grab bars and handrails in your home. Place contrasting color strips on first and last steps.  Learn and use mobility aids as needed. Install an electrical emergency response system.  Turn on lights to avoid dark areas. Replace light bulbs that burn out immediately. Get light switches that glow.  Arrange furniture to create clear pathways. Keep furniture in the same place.    Firmly attach carpet with non-skid or double-sided tape.  Eliminate uneven floor surfaces.  Select a carpet pattern that does not visually hide the edge of steps.  Be aware of all pets. OTHER HOME SAFETY TIPS  Set the water temperature for 120 F (48.8 C).  Keep emergency numbers on or near the telephone.  Keep smoke detectors on every level of the home and near sleeping areas. Document Released: 09/19/2002 Document Revised: 03/30/2012 Document Reviewed: 12/19/2011 ExitCare Patient Information 2015  [employer]. This information is not intended to replace advice given to you by your health care provider. Make sure you discuss any questions you have with your health care provider.                Exercise for Strong Bones  Exercise is important to build and maintain strong bones / bone density.  There are 2 types of exercises that are important to building and maintaining strong bones:  Weight- bearing and muscle-stregthening.  Weight-bearing Exercises  These exercises include activities that make you move against gravity while staying upright. Weight-bearing exercises can be high-impact or low-impact.  High-impact weight-bearing exercises help build bones and keep them strong. If you have broken a bone due to osteoporosis or are at risk of breaking a bone, you may need to avoid high-impact exercises. If you're not sure, you should check with your healthcare provider.  Examples of high-impact weight-bearing exercises are: Dancing  Doing high-impact aerobics  Hiking  Jogging/running  Jumping Rope  Stair climbing  Tennis  Low-impact weight-bearing exercises can also help keep bones strong and are a safe alternative if you cannot do high-impact exercises.   Examples of low-impact weight-bearing exercises are: Using elliptical training machines  Doing low-impact aerobics  Using stair-step machines  Fast walking on a treadmill or outside   Muscle-Strengthening Exercises These exercises include activities where you move your body, a weight or some other resistance against gravity. They are also known as resistance exercises and include: Lifting weights  Using elastic exercise bands  Using weight machines  Lifting your own body weight  Functional movements, such as standing and rising up on your toes  Yoga and Pilates can also improve strength, balance and flexibility. However, certain positions may not be safe for people with osteoporosis or those at increased risk of broken  bones. For example, exercises that have you bend forward may increase the chance of breaking a bone in the spine.   Non-Impact Exercises There are other types of exercises that can help prevent falls.  Non-impact exercises can help you to improve balance, posture and how well you move in everyday activities. Some of these exercises include: Balance exercises that strengthen your legs and test your balance, such as Tai Chi, can decrease your risk of falls.  Posture exercises that improve your posture and reduce rounded or "sloping" shoulders can help you decrease the chance of breaking a bone, especially in the spine.  Functional exercises that improve how well you move can help you with everyday activities and decrease your chance of falling and breaking a bone. For example, if you have trouble getting up from a chair or climbing stairs, you should do these activities as exercises.   **A physical therapist can teach you balance, posture and functional exercises. He/she can also help you learn which exercises are safe and appropriate for you.  [facility] has a physical therapy office in [place] in front of our office and referrals can be made for assessments   and treatment as needed and strength and balance training.  If you would like to have an assessment with [facility] and our physical therapy team please let a nurse or provider know.    

## 2014-11-13 DIAGNOSIS — H401221 Low-tension glaucoma, left eye, mild stage: Secondary | ICD-10-CM | POA: Diagnosis not present

## 2014-12-07 DIAGNOSIS — B351 Tinea unguium: Secondary | ICD-10-CM | POA: Diagnosis not present

## 2014-12-07 DIAGNOSIS — L84 Corns and callosities: Secondary | ICD-10-CM | POA: Diagnosis not present

## 2014-12-07 DIAGNOSIS — E1142 Type 2 diabetes mellitus with diabetic polyneuropathy: Secondary | ICD-10-CM | POA: Diagnosis not present

## 2015-01-03 ENCOUNTER — Other Ambulatory Visit: Payer: Self-pay | Admitting: Nurse Practitioner

## 2015-01-18 DIAGNOSIS — L97511 Non-pressure chronic ulcer of other part of right foot limited to breakdown of skin: Secondary | ICD-10-CM | POA: Diagnosis not present

## 2015-01-18 DIAGNOSIS — E1142 Type 2 diabetes mellitus with diabetic polyneuropathy: Secondary | ICD-10-CM | POA: Diagnosis not present

## 2015-01-25 DIAGNOSIS — H401213 Low-tension glaucoma, right eye, severe stage: Secondary | ICD-10-CM | POA: Diagnosis not present

## 2015-01-25 DIAGNOSIS — H401221 Low-tension glaucoma, left eye, mild stage: Secondary | ICD-10-CM | POA: Diagnosis not present

## 2015-01-25 DIAGNOSIS — H40053 Ocular hypertension, bilateral: Secondary | ICD-10-CM | POA: Diagnosis not present

## 2015-01-25 DIAGNOSIS — L97412 Non-pressure chronic ulcer of right heel and midfoot with fat layer exposed: Secondary | ICD-10-CM | POA: Diagnosis not present

## 2015-01-31 ENCOUNTER — Ambulatory Visit (INDEPENDENT_AMBULATORY_CARE_PROVIDER_SITE_OTHER): Payer: Medicare Other

## 2015-01-31 ENCOUNTER — Ambulatory Visit (INDEPENDENT_AMBULATORY_CARE_PROVIDER_SITE_OTHER): Payer: Medicare Other | Admitting: Nurse Practitioner

## 2015-01-31 ENCOUNTER — Encounter: Payer: Self-pay | Admitting: Nurse Practitioner

## 2015-01-31 VITALS — BP 129/69 | HR 89 | Temp 97.3°F | Ht 65.0 in | Wt 172.0 lb

## 2015-01-31 DIAGNOSIS — R5382 Chronic fatigue, unspecified: Secondary | ICD-10-CM

## 2015-01-31 DIAGNOSIS — M810 Age-related osteoporosis without current pathological fracture: Secondary | ICD-10-CM | POA: Diagnosis not present

## 2015-01-31 DIAGNOSIS — L97519 Non-pressure chronic ulcer of other part of right foot with unspecified severity: Secondary | ICD-10-CM | POA: Diagnosis not present

## 2015-01-31 DIAGNOSIS — I10 Essential (primary) hypertension: Secondary | ICD-10-CM | POA: Diagnosis not present

## 2015-01-31 DIAGNOSIS — R600 Localized edema: Secondary | ICD-10-CM | POA: Diagnosis not present

## 2015-01-31 DIAGNOSIS — Z23 Encounter for immunization: Secondary | ICD-10-CM | POA: Diagnosis not present

## 2015-01-31 DIAGNOSIS — E785 Hyperlipidemia, unspecified: Secondary | ICD-10-CM | POA: Diagnosis not present

## 2015-01-31 DIAGNOSIS — E1142 Type 2 diabetes mellitus with diabetic polyneuropathy: Secondary | ICD-10-CM | POA: Diagnosis not present

## 2015-01-31 DIAGNOSIS — R938 Abnormal findings on diagnostic imaging of other specified body structures: Secondary | ICD-10-CM | POA: Diagnosis not present

## 2015-01-31 DIAGNOSIS — F411 Generalized anxiety disorder: Secondary | ICD-10-CM

## 2015-01-31 LAB — POCT GLYCOSYLATED HEMOGLOBIN (HGB A1C): Hemoglobin A1C: 7.2

## 2015-01-31 MED ORDER — LORAZEPAM 0.5 MG PO TABS: ORAL_TABLET | ORAL | Status: DC

## 2015-01-31 MED ORDER — GLIPIZIDE ER 10 MG PO TB24: 10.0000 mg | ORAL_TABLET | Freq: Every day | ORAL | Status: DC

## 2015-01-31 MED ORDER — SAXAGLIPTIN HCL 5 MG PO TABS: 5.0000 mg | ORAL_TABLET | Freq: Every day | ORAL | Status: DC

## 2015-01-31 NOTE — Progress Notes (Addendum)
Subjective:    Patient ID: Catherine Cooper, female    DOB: 1940/11/26, 74 y.o.   MRN: [id number]  Patient here today for follow- up- no changes since last visit. Her only complaint today is fatigue, which she has been complaining about for awhile.  Diabetes She presents for her follow-up diabetic visit. She has type 2 diabetes mellitus. No MedicAlert identification noted. Her disease course has been stable. Pertinent negatives for hypoglycemia include no headaches. Pertinent negatives for diabetes include no chest pain, no polydipsia, no polyphagia, no polyuria and no weakness. There are no hypoglycemic complications. There are no diabetic complications. Risk factors for coronary artery disease include dyslipidemia, hypertension and post-menopausal. Current diabetic treatment includes oral agent (dual therapy). She is compliant with treatment most of the time. Her weight is stable. When asked about meal planning, she reported none. She has not had a previous visit with a dietitian. She rarely participates in exercise. Her breakfast blood glucose is taken between 8-9 am. Her breakfast blood glucose range is generally 110-130 mg/dl. Her overall blood glucose range is 110-130 mg/dl. An ACE inhibitor/angiotensin II receptor blocker is not being taken. She does not see a podiatrist.Eye exam is not current.  Hypertension This is a chronic problem. The problem is unchanged. The problem is controlled. Pertinent negatives include no chest pain, headaches, palpitations or shortness of breath. Risk factors for coronary artery disease include dyslipidemia, diabetes mellitus, post-menopausal state and sedentary lifestyle. Past treatments include nothing. The current treatment provides moderate improvement. Compliance problems include diet and exercise.   Hyperlipidemia This is a chronic problem. The current episode started more than 1 year ago. The problem is uncontrolled. Recent lipid tests were reviewed and are  variable. Associated symptoms include myalgias. Pertinent negatives include no chest pain or shortness of breath. Current antihyperlipidemic treatment includes diet change. The current treatment provides mild improvement of lipids. Compliance problems include adherence to diet and adherence to exercise.  Risk factors for coronary artery disease include diabetes mellitus, dyslipidemia, hypertension and post-menopausal.  osteporosis Fosamax weekly without side effects- Very little weight bearing exercises. Gad Only takes alprazolam once in a while not daily.    Review of Systems  Constitutional: Negative.   HENT: Negative.   Respiratory: Negative for shortness of breath.   Cardiovascular: Negative for chest pain and palpitations.  Endocrine: Negative for polydipsia, polyphagia and polyuria.  Musculoskeletal: Positive for myalgias.  Neurological: Negative for weakness and headaches.  Psychiatric/Behavioral: Negative.   All other systems reviewed and are negative.      Objective:   Physical Exam  Constitutional: She is oriented to person, place, and time. She appears well-developed and well-nourished.  HENT:  Nose: Nose normal.  Mouth/Throat: Oropharynx is clear and moist.  Right cerumen impaction   Eyes: EOM are normal.  Neck: Trachea normal, normal range of motion and full passive range of motion without pain. Neck supple. No JVD present. Carotid bruit is not present. No thyromegaly present.  Cardiovascular: Normal rate, regular rhythm, normal heart sounds and intact distal pulses.  Exam reveals no gallop and no friction rub.   No murmur heard. Pulmonary/Chest: Effort normal and breath sounds normal.  Abdominal: Soft. Bowel sounds are normal. She exhibits no distension and no mass. There is no tenderness.  Musculoskeletal: Normal range of motion.  Lymphadenopathy:    She has no cervical adenopathy.  Neurological: She is alert and oriented to person, place, and time. She has normal  reflexes.  Skin: Skin is warm and dry.  Psychiatric: She has a normal mood and affect. Her behavior is normal. Judgment and thought content normal.     BP 129/69 mmHg  Pulse 89  Temp(Src) 97.3 F (36.3 C) (Oral)  Ht 5' 5"  (1.651 m)  Wt 172 lb (78.019 kg)  BMI 28.62 kg/m2  Results for orders placed or performed in visit on 01/31/15  POCT glycosylated hemoglobin (Hb A1C)  Result Value Ref Range   Hemoglobin A1C 7.2     EKG-NSR-Mary-Margaret Hassell Done, FNP Chest x ray- mild bronchitic changes-Preliminary reading by Ronnald Collum, FNP  [facility]        Assessment & Plan:   1. Hyperlipidemia - NMR, lipoprofile  2. Essential hypertension Do not add salt to diet - CMP14+EGFR  3. Type 2 diabetes mellitus with diabetic polyneuropathy Continue to watch carbs - POCT glycosylated hemoglobin (Hb A1C) - glipiZIDE (GLIPIZIDE XL) 10 MG 24 hr tablet; Take 1 tablet (10 mg total) by mouth daily with breakfast.  Dispense: 90 tablet; Refill: 1 - saxagliptin HCl (ONGLYZA) 5 MG TABS tablet; Take 1 tablet (5 mg total) by mouth daily.  Dispense: 90 tablet; Refill: 1  4. Chronic fatigue - DG Chest 2 View; Future - EKG 12-Lead - Anemia Profile B  5. Osteoporosis Weight bearing exercises  6. GAD (generalized anxiety disorder) Stress management - LORazepam (ATIVAN) 0.5 MG tablet; TAKE ONE TABLET BY MOUTH TWICE DAILY AS NEEDED  Dispense: 60 tablet; Refill: 2    Labs pending Health maintenance reviewed Diet and exercise encouraged Continue all meds Follow up  In 3 months    [place], FNP

## 2015-01-31 NOTE — Patient Instructions (Signed)
Bone Health Our bones do many things. They provide structure, protect organs, anchor muscles, and store calcium. Adequate calcium in your diet and weight-bearing physical activity help build strong bones, improve bone amounts, and may reduce the risk of weakening of bones (osteoporosis) later in life. PEAK BONE MASS By age 75, the average woman has acquired most of her skeletal bone mass. A large decline occurs in older adults which increases the risk of osteoporosis. In women this occurs around the time of menopause. It is important for young girls to reach their peak bone mass in order to maintain bone health throughout life. A person with high bone mass as a young adult will be more likely to have a higher bone mass later in life. Not enough calcium consumption and physical activity early on could result in a failure to achieve optimum bone mass in adulthood. OSTEOPOROSIS Osteoporosis is a disease of the bones. It is defined as low bone mass with deterioration of bone structure. Osteoporosis leads to an increase risk of fractures with falls. These fractures commonly happen in the wrist, hip, and spine. While men and women of all ages and background can develop osteoporosis, some of the risk factors for osteoporosis are:  Female.  White.  Postmenopausal.  Older adults.  Small in body size.  Eating a diet low in calcium.  Physically inactive.  Smoking.  Use of some medications.  Family history. CALCIUM Calcium is a mineral needed by the body for healthy bones, teeth, and proper function of the heart, muscles, and nerves. The body cannot produce calcium so it must be absorbed through food. Good sources of calcium include:  Dairy products (low fat or nonfat milk, cheese, and yogurt).  Dark green leafy vegetables (bok choy and broccoli).  Calcium fortified foods (orange juice, cereal, bread, soy beverages, and tofu products).  Nuts (almonds). Recommended amounts of calcium vary  for individuals. RECOMMENDED CALCIUM INTAKES Age and Amount in mg per day  Children 1 to 3 years / 700 mg  Children 4 to 8 years / 1,000 mg  Children 9 to 13 years / 1,300 mg  Teens 14 to 18 years / 1,300 mg  Adults 19 to 50 years / 1,000 mg  Adult women 51 to 70 years / 1,200 mg  Adults 71 years and older / 1,200 mg  Pregnant and breastfeeding teens / 1,300 mg  Pregnant and breastfeeding adults / 1,000 mg Vitamin D also plays an important role in healthy bone development. Vitamin D helps in the absorption of calcium. WEIGHT-BEARING PHYSICAL ACTIVITY Regular physical activity has many positive health benefits. Benefits include strong bones. Weight-bearing physical activity early in life is important in reaching peak bone mass. Weight-bearing physical activities cause muscles and bones to work against gravity. Some examples of weight bearing physical activities include:  Walking, jogging, or running.  Field Hockey.  Jumping rope.  Dancing.  Soccer.  Tennis or Racquetball.  Stair climbing.  Basketball.  Hiking.  Weight lifting.  Aerobic fitness classes. Including weight-bearing physical activity into an exercise plan is a great way to keep bones healthy. Adults: Engage in at least 30 minutes of moderate physical activity on most, preferably all, days of the week. Children: Engage in at least 60 minutes of moderate physical activity on most, preferably all, days of the week. FOR MORE INFORMATION [place] Department of Agriculture, Center for Nutrition Policy and Promotion: www.cnpp.usda.gov National Osteoporosis Foundation: www.nof.org Document Released: 12/20/2003 Document Revised: 01/24/2013 Document Reviewed: 03/21/2009 ExitCare Patient Information   2015 [employer]. This information is not intended to replace advice given to you by your health care provider. Make sure you discuss any questions you have with your health care provider.  

## 2015-02-01 DIAGNOSIS — L97412 Non-pressure chronic ulcer of right heel and midfoot with fat layer exposed: Secondary | ICD-10-CM | POA: Diagnosis not present

## 2015-02-01 LAB — ANEMIA PROFILE B
BASOS: 0 %
Basophils Absolute: 0 10*3/uL (ref 0.0–0.2)
EOS ABS: 0.3 10*3/uL (ref 0.0–0.4)
Eos: 5 %
Ferritin: 229 ng/mL — ABNORMAL HIGH (ref 15–150)
Folate: >20 ng/mL (ref >3.0)
HEMATOCRIT: 37.9 % (ref 34.0–46.6)
Hemoglobin: 12.7 g/dL (ref 11.1–15.9)
IMMATURE GRANS (ABS): 0 10*3/uL (ref 0.0–0.1)
Immature Granulocytes: 0 %
Iron Saturation: 38 % (ref 15–55)
Iron: 101 ug/dL (ref 27–139)
Lymphocytes Absolute: 1.6 10*3/uL (ref 0.7–3.1)
Lymphs: 25 %
MCH: 31.9 pg (ref 26.6–33.0)
MCHC: 33.5 g/dL (ref 31.5–35.7)
MCV: 95 fL (ref 79–97)
MONOS ABS: 0.4 10*3/uL (ref 0.1–0.9)
Monocytes: 6 %
NEUTROS PCT: 64 %
Neutrophils Absolute: 4 10*3/uL (ref 1.4–7.0)
Platelets: 164 10*3/uL (ref 150–379)
RBC: 3.98 x10E6/uL (ref 3.77–5.28)
RDW: 13.3 % (ref 12.3–15.4)
RETIC CT PCT: 1 % (ref 0.6–2.6)
TIBC: 266 ug/dL (ref 250–450)
UIBC: 165 ug/dL (ref 118–369)
WBC: 6.3 10*3/uL (ref 3.4–10.8)

## 2015-02-01 LAB — CMP14+EGFR
A/G RATIO: 1.4 (ref 1.1–2.5)
ALBUMIN: 4 g/dL (ref 3.5–4.8)
ALK PHOS: 84 IU/L (ref 39–117)
ALT: 13 IU/L (ref 0–32)
AST: 16 IU/L (ref 0–40)
BUN / CREAT RATIO: 12 (ref 11–26)
BUN: 12 mg/dL (ref 8–27)
Bilirubin Total: 0.5 mg/dL (ref 0.0–1.2)
CO2: 26 mmol/L (ref 18–29)
Calcium: 9.2 mg/dL (ref 8.7–10.3)
Chloride: 101 mmol/L (ref 97–108)
Creatinine, Ser: 0.99 mg/dL (ref 0.57–1.00)
GFR, EST AFRICAN AMERICAN: 65 mL/min/{1.73_m2} (ref >59)
GFR, EST NON AFRICAN AMERICAN: 56 mL/min/{1.73_m2} — AB (ref >59)
GLOBULIN, TOTAL: 2.8 g/dL (ref 1.5–4.5)
GLUCOSE: 170 mg/dL — AB (ref 65–99)
Potassium: 4.3 mmol/L (ref 3.5–5.2)
Sodium: 141 mmol/L (ref 134–144)
TOTAL PROTEIN: 6.8 g/dL (ref 6.0–8.5)

## 2015-02-01 LAB — NMR, LIPOPROFILE
Cholesterol: 166 mg/dL (ref 100–199)
HDL CHOLESTEROL BY NMR: 36 mg/dL — AB (ref >39)
HDL Particle Number: 29.3 umol/L — ABNORMAL LOW (ref >=30.5)
LDL Particle Number: 1085 nmol/L — ABNORMAL HIGH (ref <1000)
LDL Size: 20 nm (ref >20.5)
LDL-C: 77 mg/dL (ref 0–99)
LP-IR Score: 65 — ABNORMAL HIGH (ref <=45)
SMALL LDL PARTICLE NUMBER: 711 nmol/L — AB (ref <=527)
Triglycerides by NMR: 266 mg/dL — ABNORMAL HIGH (ref 0–149)

## 2015-02-01 NOTE — Addendum Note (Signed)
Addended by: Rolena Infante on: 02/01/2015 09:07 AM   Modules accepted: Orders

## 2015-02-08 ENCOUNTER — Other Ambulatory Visit (HOSPITAL_COMMUNITY): Payer: Self-pay | Admitting: Orthopedic Surgery

## 2015-02-08 DIAGNOSIS — E1142 Type 2 diabetes mellitus with diabetic polyneuropathy: Secondary | ICD-10-CM | POA: Diagnosis not present

## 2015-02-08 DIAGNOSIS — L97514 Non-pressure chronic ulcer of other part of right foot with necrosis of bone: Secondary | ICD-10-CM | POA: Diagnosis not present

## 2015-02-08 DIAGNOSIS — M86271 Subacute osteomyelitis, right ankle and foot: Secondary | ICD-10-CM | POA: Diagnosis not present

## 2015-02-13 ENCOUNTER — Encounter (HOSPITAL_COMMUNITY): Payer: Self-pay | Admitting: *Deleted

## 2015-02-13 MED ORDER — CLINDAMYCIN PHOSPHATE 900 MG/50ML IV SOLN
900.0000 mg | INTRAVENOUS | Status: AC
  Administered 2015-02-14: 900 mg via INTRAVENOUS
  Filled 2015-02-13: qty 50

## 2015-02-14 ENCOUNTER — Ambulatory Visit (HOSPITAL_COMMUNITY): Payer: Medicare Other | Admitting: Certified Registered"

## 2015-02-14 ENCOUNTER — Encounter (HOSPITAL_COMMUNITY): Payer: Self-pay | Admitting: *Deleted

## 2015-02-14 ENCOUNTER — Ambulatory Visit (HOSPITAL_COMMUNITY)
Admission: RE | Admit: 2015-02-14 | Discharge: 2015-02-14 | Disposition: A | Discharge status: Home / Self Care | Payer: Medicare Other | Source: Ambulatory Visit | Attending: Orthopedic Surgery | Admitting: Orthopedic Surgery

## 2015-02-14 ENCOUNTER — Encounter (HOSPITAL_COMMUNITY)
Admission: RE | Disposition: A | Discharge status: Home / Self Care | Payer: Self-pay | Source: Ambulatory Visit | Attending: Orthopedic Surgery

## 2015-02-14 DIAGNOSIS — F419 Anxiety disorder, unspecified: Secondary | ICD-10-CM | POA: Insufficient documentation

## 2015-02-14 DIAGNOSIS — M810 Age-related osteoporosis without current pathological fracture: Secondary | ICD-10-CM | POA: Diagnosis not present

## 2015-02-14 DIAGNOSIS — L97514 Non-pressure chronic ulcer of other part of right foot with necrosis of bone: Secondary | ICD-10-CM | POA: Diagnosis not present

## 2015-02-14 DIAGNOSIS — E785 Hyperlipidemia, unspecified: Secondary | ICD-10-CM | POA: Diagnosis not present

## 2015-02-14 DIAGNOSIS — Z90710 Acquired absence of both cervix and uterus: Secondary | ICD-10-CM | POA: Insufficient documentation

## 2015-02-14 DIAGNOSIS — K219 Gastro-esophageal reflux disease without esophagitis: Secondary | ICD-10-CM | POA: Insufficient documentation

## 2015-02-14 DIAGNOSIS — M86271 Subacute osteomyelitis, right ankle and foot: Secondary | ICD-10-CM | POA: Diagnosis not present

## 2015-02-14 DIAGNOSIS — M86672 Other chronic osteomyelitis, left ankle and foot: Secondary | ICD-10-CM

## 2015-02-14 DIAGNOSIS — Z9851 Tubal ligation status: Secondary | ICD-10-CM | POA: Diagnosis not present

## 2015-02-14 DIAGNOSIS — Z9841 Cataract extraction status, right eye: Secondary | ICD-10-CM | POA: Diagnosis not present

## 2015-02-14 DIAGNOSIS — Z7982 Long term (current) use of aspirin: Secondary | ICD-10-CM | POA: Diagnosis not present

## 2015-02-14 DIAGNOSIS — Z9842 Cataract extraction status, left eye: Secondary | ICD-10-CM | POA: Insufficient documentation

## 2015-02-14 DIAGNOSIS — E1142 Type 2 diabetes mellitus with diabetic polyneuropathy: Secondary | ICD-10-CM | POA: Diagnosis not present

## 2015-02-14 DIAGNOSIS — E119 Type 2 diabetes mellitus without complications: Secondary | ICD-10-CM | POA: Insufficient documentation

## 2015-02-14 DIAGNOSIS — M869 Osteomyelitis, unspecified: Secondary | ICD-10-CM | POA: Diagnosis not present

## 2015-02-14 DIAGNOSIS — Z880 Allergy status to penicillin: Secondary | ICD-10-CM | POA: Insufficient documentation

## 2015-02-14 DIAGNOSIS — Z888 Allergy status to other drugs, medicaments and biological substances status: Secondary | ICD-10-CM | POA: Insufficient documentation

## 2015-02-14 DIAGNOSIS — F411 Generalized anxiety disorder: Secondary | ICD-10-CM | POA: Diagnosis not present

## 2015-02-14 DIAGNOSIS — Z87442 Personal history of urinary calculi: Secondary | ICD-10-CM | POA: Diagnosis not present

## 2015-02-14 DIAGNOSIS — M868X7 Other osteomyelitis, ankle and foot: Secondary | ICD-10-CM | POA: Insufficient documentation

## 2015-02-14 HISTORY — DX: Iron deficiency: E61.1

## 2015-02-14 HISTORY — DX: Diarrhea, unspecified: R19.7

## 2015-02-14 HISTORY — PX: AMPUTATION: SHX166

## 2015-02-14 HISTORY — DX: Polyneuropathy, unspecified: G62.9

## 2015-02-14 HISTORY — DX: Anxiety disorder, unspecified: F41.9

## 2015-02-14 LAB — COMPREHENSIVE METABOLIC PANEL
ALBUMIN: 3.7 g/dL (ref 3.5–5.0)
ALK PHOS: 66 U/L (ref 38–126)
ALT: 19 U/L (ref 14–54)
ANION GAP: 11 (ref 5–15)
AST: 21 U/L (ref 15–41)
BUN: 15 mg/dL (ref 6–20)
CHLORIDE: 103 mmol/L (ref 101–111)
CO2: 27 mmol/L (ref 22–32)
Calcium: 9.3 mg/dL (ref 8.9–10.3)
Creatinine, Ser: 1 mg/dL (ref 0.44–1.00)
GFR calc Af Amer: >60 mL/min (ref >60)
GFR calc non Af Amer: 54 mL/min — ABNORMAL LOW (ref >60)
Glucose, Bld: 127 mg/dL — ABNORMAL HIGH (ref 70–99)
POTASSIUM: 4.1 mmol/L (ref 3.5–5.1)
SODIUM: 141 mmol/L (ref 135–145)
TOTAL PROTEIN: 6.7 g/dL (ref 6.5–8.1)
Total Bilirubin: 0.9 mg/dL (ref 0.3–1.2)

## 2015-02-14 LAB — GLUCOSE, CAPILLARY
GLUCOSE-CAPILLARY: 130 mg/dL — AB (ref 70–99)
Glucose-Capillary: 124 mg/dL — ABNORMAL HIGH (ref 70–99)

## 2015-02-14 LAB — CBC
HCT: 36.4 % (ref 36.0–46.0)
HEMOGLOBIN: 12 g/dL (ref 12.0–15.0)
MCH: 31.3 pg (ref 26.0–34.0)
MCHC: 33 g/dL (ref 30.0–36.0)
MCV: 95 fL (ref 78.0–100.0)
PLATELETS: 145 10*3/uL — AB (ref 150–400)
RBC: 3.83 MIL/uL — AB (ref 3.87–5.11)
RDW: 13.1 % (ref 11.5–15.5)
WBC: 5.5 10*3/uL (ref 4.0–10.5)

## 2015-02-14 LAB — PROTIME-INR
INR: 1.04 (ref 0.00–1.49)
Prothrombin Time: 13.7 seconds (ref 11.6–15.2)

## 2015-02-14 LAB — APTT: APTT: 26 s (ref 24–37)

## 2015-02-14 SURGERY — AMPUTATION, FOOT, RAY
Anesthesia: Monitor Anesthesia Care | Laterality: Right

## 2015-02-14 MED ORDER — PROPOFOL 10 MG/ML IV BOLUS
INTRAVENOUS | Status: AC
  Filled 2015-02-14: qty 20

## 2015-02-14 MED ORDER — SUFENTANIL CITRATE 50 MCG/ML IV SOLN
INTRAVENOUS | Status: AC
  Filled 2015-02-14: qty 1

## 2015-02-14 MED ORDER — LIDOCAINE HCL (CARDIAC) 20 MG/ML IV SOLN
INTRAVENOUS | Status: AC
  Filled 2015-02-14: qty 5

## 2015-02-14 MED ORDER — MIDAZOLAM HCL 2 MG/2ML IJ SOLN
INTRAMUSCULAR | Status: AC
  Filled 2015-02-14: qty 2

## 2015-02-14 MED ORDER — PROPOFOL 10 MG/ML IV BOLUS
INTRAVENOUS | Status: DC | PRN
  Administered 2015-02-14: 30 mg via INTRAVENOUS

## 2015-02-14 MED ORDER — LACTATED RINGERS IV SOLN
INTRAVENOUS | Status: DC
  Administered 2015-02-14: 08:00:00 via INTRAVENOUS

## 2015-02-14 MED ORDER — PROMETHAZINE HCL 25 MG/ML IJ SOLN: 6.2500 mg | INTRAMUSCULAR | Status: DC | PRN

## 2015-02-14 MED ORDER — HYDROCODONE-ACETAMINOPHEN 5-325 MG PO TABS: 1.0000 | ORAL_TABLET | Freq: Four times a day (QID) | ORAL | Status: DC | PRN

## 2015-02-14 MED ORDER — SODIUM CHLORIDE 0.9 % IJ SOLN
INTRAMUSCULAR | Status: AC
  Filled 2015-02-14: qty 10

## 2015-02-14 MED ORDER — BUPIVACAINE HCL (PF) 0.5 % IJ SOLN
INTRAMUSCULAR | Status: DC | PRN
  Administered 2015-02-14: 35 mL

## 2015-02-14 MED ORDER — LIDOCAINE HCL (CARDIAC) 20 MG/ML IV SOLN
INTRAVENOUS | Status: DC | PRN
  Administered 2015-02-14: 80 mg via INTRAVENOUS

## 2015-02-14 MED ORDER — MIDAZOLAM HCL 2 MG/2ML IJ SOLN: 0.5000 mg | Freq: Once | INTRAMUSCULAR | Status: DC | PRN

## 2015-02-14 MED ORDER — SUFENTANIL CITRATE 50 MCG/ML IV SOLN
INTRAVENOUS | Status: DC | PRN
  Administered 2015-02-14: 10 ug via INTRAVENOUS

## 2015-02-14 MED ORDER — ONDANSETRON HCL 4 MG/2ML IJ SOLN
INTRAMUSCULAR | Status: AC
  Filled 2015-02-14: qty 2

## 2015-02-14 MED ORDER — MIDAZOLAM HCL 2 MG/2ML IJ SOLN
INTRAMUSCULAR | Status: DC | PRN
  Administered 2015-02-14: 2 mg via INTRAVENOUS

## 2015-02-14 MED ORDER — MEPERIDINE HCL 25 MG/ML IJ SOLN: 6.2500 mg | INTRAMUSCULAR | Status: DC | PRN

## 2015-02-14 MED ORDER — 0.9 % SODIUM CHLORIDE (POUR BTL) OPTIME
TOPICAL | Status: DC | PRN
  Administered 2015-02-14: 1000 mL

## 2015-02-14 MED ORDER — FENTANYL CITRATE (PF) 100 MCG/2ML IJ SOLN: 25.0000 ug | INTRAMUSCULAR | Status: DC | PRN

## 2015-02-14 SURGICAL SUPPLY — 31 items
BLADE SAW SGTL MED 73X18.5 STR (BLADE) IMPLANT
BNDG COHESIVE 4X5 TAN STRL (GAUZE/BANDAGES/DRESSINGS) ×3 IMPLANT
BNDG GAUZE ELAST 4 BULKY (GAUZE/BANDAGES/DRESSINGS) ×3 IMPLANT
COVER SURGICAL LIGHT HANDLE (MISCELLANEOUS) ×4 IMPLANT
DRAPE U-SHAPE 47X51 STRL (DRAPES) ×4 IMPLANT
DRSG ADAPTIC 3X8 NADH LF (GAUZE/BANDAGES/DRESSINGS) ×3 IMPLANT
DRSG PAD ABDOMINAL 8X10 ST (GAUZE/BANDAGES/DRESSINGS) ×4 IMPLANT
DURAPREP 26ML APPLICATOR (WOUND CARE) ×3 IMPLANT
ELECT REM PT RETURN 9FT ADLT (ELECTROSURGICAL) ×3
ELECTRODE REM PT RTRN 9FT ADLT (ELECTROSURGICAL) ×1 IMPLANT
GAUZE SPONGE 4X4 12PLY STRL (GAUZE/BANDAGES/DRESSINGS) ×3 IMPLANT
GLOVE BIOGEL PI IND STRL 9 (GLOVE) ×1 IMPLANT
GLOVE BIOGEL PI INDICATOR 9 (GLOVE) ×2
GLOVE SURG ORTHO 9.0 STRL STRW (GLOVE) ×3 IMPLANT
GOWN STRL REUS W/ TWL LRG LVL3 (GOWN DISPOSABLE) ×1 IMPLANT
GOWN STRL REUS W/ TWL XL LVL3 (GOWN DISPOSABLE) ×2 IMPLANT
GOWN STRL REUS W/TWL LRG LVL3 (GOWN DISPOSABLE) ×3
GOWN STRL REUS W/TWL XL LVL3 (GOWN DISPOSABLE) ×6
KIT BASIN OR (CUSTOM PROCEDURE TRAY) ×3 IMPLANT
KIT ROOM TURNOVER OR (KITS) ×3 IMPLANT
NS IRRIG 1000ML POUR BTL (IV SOLUTION) ×3 IMPLANT
PACK ORTHO EXTREMITY (CUSTOM PROCEDURE TRAY) ×3 IMPLANT
PAD ARMBOARD 7.5X6 YLW CONV (MISCELLANEOUS) ×4 IMPLANT
SPONGE GAUZE 4X4 12PLY STER LF (GAUZE/BANDAGES/DRESSINGS) ×2 IMPLANT
SPONGE LAP 18X18 X RAY DECT (DISPOSABLE) ×3 IMPLANT
STOCKINETTE IMPERVIOUS LG (DRAPES) IMPLANT
SUT ETHILON 2 0 PSLX (SUTURE) ×6 IMPLANT
TOWEL OR 17X24 6PK STRL BLUE (TOWEL DISPOSABLE) ×3 IMPLANT
TOWEL OR 17X26 10 PK STRL BLUE (TOWEL DISPOSABLE) ×3 IMPLANT
UNDERPAD 30X30 INCONTINENT (UNDERPADS AND DIAPERS) ×3 IMPLANT
WATER STERILE IRR 1000ML POUR (IV SOLUTION) ×1 IMPLANT

## 2015-02-14 NOTE — Transfer of Care (Signed)
Immediate Anesthesia Transfer of Care Note  Patient: Catherine Cooper  Procedure(s) Performed: Procedure(s): 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 (Right)  Patient Location: PACU  Anesthesia Type:MAC  Level of Consciousness: awake, alert , oriented and patient cooperative  Airway & Oxygen Therapy: Patient Spontanous Breathing  Post-op Assessment: Report given to RN, Post -op Vital signs reviewed and stable and Patient moving all extremities X 4  Post vital signs: Reviewed and stable  Last Vitals:  Filed Vitals:   02/14/15 1017  BP: 138/71  Pulse: 70  Temp:   Resp:     Complications: No apparent anesthesia complications

## 2015-02-14 NOTE — Progress Notes (Signed)
Orthopedic Tech Progress Note Patient Details:  Catherine Cooper [birth year] [id number]  Patient ID: [name], female   DOB: 30-Jun-1941, 74 y.o.   MRN: [id number] Viewed order from doctor's order list  Hildred Priest 02/14/2015, 10:53 AM

## 2015-02-14 NOTE — Op Note (Signed)
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    The risks benefits and alternatives were discussed with the patient preoperatively including but not limited to the risks of infection, bleeding, nerve injury, cardiopulmonary complications, the need for revision surgery, among others, and the patient was willing to proceed.  OPERATIVE IMPLANTS: None  OPERATIVE FINDINGS: Good petechial bleeding no abscess no involvement of the fifth metatarsal  OPERATIVE PROCEDURE: Patient was brought to the operating room after undergoing an ankle block. After adequate levels anesthesia obtained patient's right lower extremity was prepped using DuraPrep draped into a sterile field. A timeout was called. An elliptical incision was made around the toe and ulcer. The ulcer and fourth ray were resected in 1 block of tissue. The wound was irrigated with normal saline. Electrocautery was used for hemostasis. The wound was closed using 2-0 nylon. Sterile compressive dressing was applied. Patient was taken to the PACU in stable condition plan for discharge to home.

## 2015-02-14 NOTE — Anesthesia Procedure Notes (Signed)
Anesthesia Regional Block:  Ankle block  Pre-Anesthetic Checklist: ,, timeout performed, Correct Patient, Correct Site, Correct Laterality, Correct Procedure, Correct Position, site marked, Risks and benefits discussed,  Surgical consent,  Pre-op evaluation,  At surgeon's request and post-op pain management  Laterality: Right and Lower  Prep: chloraprep       Needles:  Injection technique: Single-shot  Needle Type: Other   (quincke)    Needle Gauge: 25 and 25 G    Additional Needles:  Procedures: other  (perineural infiltration) Ankle block Narrative:  Start time: 02/14/2015 9:12 AM End time: 02/14/2015 9:18 AM Injection made incrementally with aspirations every 5 mL.  Performed by: Personally  Anesthesiologist: Glennon Mac, Eva Griffo  Additional Notes: Pt identified in Holding room.  Monitors applied. Working IV access confirmed. Sterile prep.  #25ga Quincke, perineural infiltration of local anesthetic around deep and sup peroneal, saph, sural, and post tib nerves.  35cc 0.5% Bupivacaine injected incrementally.  Patient asymptomatic, VSS, no heme aspirated, tolerated well.  Jenita Seashore, MD

## 2015-02-14 NOTE — Progress Notes (Signed)
Orthopedic Tech Progress Note Patient Details:  Catherine Cooper Aug 22, 1941 [id number]  Ortho Devices Type of Ortho Device: Postop shoe/boot Ortho Device/Splint Location: rle Ortho Device/Splint Interventions: Application   [name] 02/14/2015, 10:52 AM

## 2015-02-14 NOTE — H&P (Signed)
Catherine Cooper is an 74 y.o. female.   Chief Complaint: Osteomyelitis ulceration right foot fourth toe HPI: Patient is a 74 year old woman with diabetic insensate neuropathy osteomyelitis ulceration and has failed conservative treatment  Past Medical History  Diagnosis Date  . Diabetes mellitus     type II  . Osteoporosis   . Glaucoma   . GAD (generalized anxiety disorder)   . GERD (gastroesophageal reflux disease)   . Hyperlipidemia     not on medications  . Endometrioid adenocarcinoma   . 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 Shingles 2004  . Cataracts, bilateral   . Anxiety   . Nephrolithiasis 2000  . Iron deficiency   . Diarrhea     attributed to diabetic meds  . Neuropathy     both feet    Past Surgical History  Procedure Laterality Date  .  vitrectomy  02/23/08    posterior; right eye  . Tubal ligation    . Foot surgery      Right foot / left heel  . Glaucome surgery right eye    . Cardiac catheterization Bilateral     cataract surgery w/ lens implant  . Skin graft Right 2007    foot  . Colonoscopy  2013  . Abdominal hysterectomy      Family History  Problem Relation Age of Onset  . Dementia Father   . Diabetes Father   . Coronary artery disease Father 42  . Hip fracture Father   . Atrial fibrillation Mother   . Osteoporosis Mother    Social History:  reports that she has never smoked. She has never used smokeless tobacco. She reports that she does not drink alcohol or use illicit drugs.  Allergies:  Allergies  Allergen Reactions  . Lipitor [Atorvastatin]     fatique and myalgia  . Penicillins Rash    Medications Prior to Admission  Medication Sig Dispense Refill  . aspirin 81 MG tablet Take 81 mg by mouth every evening.     . calcium citrate (CALCITRATE - DOSED IN MG ELEMENTAL CALCIUM) 950 MG tablet Take 200 mg of elemental calcium by mouth 2 (two) times daily.     . Cholecalciferol (VITAMIN D3) 2000  UNITS TABS Take 1 capsule by mouth daily.     Marland Kitchen DEXILANT 60 MG capsule TAKE ONE CAPSULE BY MOUTH ONE TIME DAILY 30 capsule 3  . ferrous gluconate (FERGON) 325 MG tablet Take 325 mg by mouth at bedtime.    . gabapentin (NEURONTIN) 100 MG capsule Take 1 capsule (100 mg total) by mouth daily. 30 capsule 5  . glipiZIDE (GLIPIZIDE XL) 10 MG 24 hr tablet Take 1 tablet (10 mg total) by mouth daily with breakfast. 90 tablet 1  . ibuprofen (ADVIL,MOTRIN) 800 MG tablet Take 1 tablet (800 mg total) by mouth every 6 (six) hours as needed for pain. 30 tablet 0  . ketotifen (ALAWAY) 0.025 % ophthalmic solution Place 1 drop into both eyes 2 (two) times daily as needed (itching).     Marland Kitchen L-Methylfolate-B6-B12 (METANX) 3-35-2 MG TABS Take 1 tablet by mouth daily.     Marland Kitchen levofloxacin (LEVAQUIN) 500 MG tablet Take 500 mg by mouth daily.     Marland Kitchen loperamide (IMODIUM A-D) 2 MG tablet Take 2 mg by mouth 4 (four) times daily as needed for diarrhea or loose stools.     Marland Kitchen LORazepam (ATIVAN) 0.5 MG tablet TAKE ONE TABLET BY  MOUTH TWICE DAILY AS NEEDED (Patient taking differently: Take 0.5 mg by mouth 2 (two) times daily as needed for anxiety. ) 60 tablet 2  . Multiple Vitamin (MULTI-VITAMIN DAILY PO) Take 1 tablet by mouth daily. Century Mature for adults 25+    . saxagliptin HCl (ONGLYZA) 5 MG TABS tablet Take 1 tablet (5 mg total) by mouth daily. 90 tablet 1  . vitamin B-12 (CYANOCOBALAMIN) 1000 MCG tablet Take 1,000 mcg by mouth daily.      Marland Kitchen ADVOCATE LANCETS MISC     . Blood Glucose Calibration (TAI DOC CONTROL) NORMAL SOLN     . CLEVER CHEK AUTO-CODE VOICE test strip     . Lancet Devices (ADVOCATE LANCING DEVICE) MISC       No results found for this or any previous visit (from the past 48 hour(s)). No results found.  Review of Systems  All other systems reviewed and are negative.   Blood pressure 158/84, pulse 79, temperature 97.9 F (36.6 C), temperature source Oral, resp. rate 18, weight 78.019 kg (172 lb), SpO2  100 %. Physical Exam  Patient has ulceration osteomyelitis right foot fourth toe Assessment/Plan Assessment: Osteomyelitis ulceration right foot fourth toe.  Plan: We will plan for right fourth ray amputation. Risk and benefits were discussed including need for higher level amputation. Patient states she understands wish to proceed at this time possible overnight observation  Zev Blue V 02/14/2015, 7:15 AM

## 2015-02-14 NOTE — Anesthesia Postprocedure Evaluation (Signed)
  Anesthesia Post-op Note  Patient: Catherine Cooper  Procedure(s) Performed: Procedure(s): 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 (Right)  Patient Location: PACU  Anesthesia Type:Regional  Level of Consciousness: awake, alert , oriented and patient cooperative  Airway and Oxygen Therapy: Patient Spontanous Breathing  Post-op Pain: none  Post-op Assessment: Post-op Vital signs reviewed, Patient's Cardiovascular Status Stable, Respiratory Function Stable, Patent Airway, No signs of Nausea or vomiting and Pain level controlled  Post-op Vital Signs: Reviewed and stable  Last Vitals:  Filed Vitals:   02/14/15 1017  BP: 138/71  Pulse: 70  Temp:   Resp:     Complications: No apparent anesthesia complications

## 2015-02-14 NOTE — Anesthesia Preprocedure Evaluation (Signed)
Anesthesia Evaluation  Patient identified by MRN, date of birth, ID band Patient awake    Reviewed: Allergy & Precautions, NPO status , Patient's Chart, lab work & pertinent test results  History of Anesthesia Complications Negative for: history of anesthetic complications  Airway Mallampati: I  TM Distance: >3 FB Neck ROM: Full    Dental  (+) Dental Advisory Given, Poor Dentition, Missing   Pulmonary neg pulmonary ROS,  breath sounds clear to auscultation        Cardiovascular negative cardio ROS  Rhythm:Regular Rate:Normal     Neuro/Psych Anxiety negative neurological ROS     GI/Hepatic Neg liver ROS, GERD-  Medicated and Controlled,  Endo/Other  diabetes (glu 124), Oral Hypoglycemic Agents  Renal/GU negative Renal ROS     Musculoskeletal   Abdominal   Peds  Hematology   Anesthesia Other Findings H/o endometrial cancer  Reproductive/Obstetrics                             Anesthesia Physical Anesthesia Plan  ASA: II  Anesthesia Plan: MAC and Regional   Post-op Pain Management:    Induction:   Airway Management Planned: Natural Airway and Simple Face Mask  Additional Equipment:   Intra-op Plan:   Post-operative Plan:   Informed Consent: I have reviewed the patients History and Physical, chart, labs and discussed the procedure including the risks, benefits and alternatives for the proposed anesthesia with the patient or authorized representative who has indicated his/her understanding and acceptance.   Dental advisory given  Plan Discussed with: CRNA and Surgeon  Anesthesia Plan Comments: (Plan routine monitors, ankle block with MAC)        Anesthesia Quick Evaluation

## 2015-02-19 ENCOUNTER — Encounter (HOSPITAL_COMMUNITY): Payer: Self-pay | Admitting: Orthopedic Surgery

## 2015-02-22 DIAGNOSIS — H401213 Low-tension glaucoma, right eye, severe stage: Secondary | ICD-10-CM | POA: Diagnosis not present

## 2015-02-22 DIAGNOSIS — H401221 Low-tension glaucoma, left eye, mild stage: Secondary | ICD-10-CM | POA: Diagnosis not present

## 2015-02-22 DIAGNOSIS — H40053 Ocular hypertension, bilateral: Secondary | ICD-10-CM | POA: Diagnosis not present

## 2015-02-26 DIAGNOSIS — E11359 Type 2 diabetes mellitus with proliferative diabetic retinopathy without macular edema: Secondary | ICD-10-CM | POA: Diagnosis not present

## 2015-02-26 DIAGNOSIS — E11349 Type 2 diabetes mellitus with severe nonproliferative diabetic retinopathy without macular edema: Secondary | ICD-10-CM | POA: Diagnosis not present

## 2015-04-12 DIAGNOSIS — E1142 Type 2 diabetes mellitus with diabetic polyneuropathy: Secondary | ICD-10-CM | POA: Diagnosis not present

## 2015-04-12 DIAGNOSIS — B351 Tinea unguium: Secondary | ICD-10-CM | POA: Diagnosis not present

## 2015-04-12 DIAGNOSIS — L84 Corns and callosities: Secondary | ICD-10-CM | POA: Diagnosis not present

## 2015-04-18 DIAGNOSIS — H40053 Ocular hypertension, bilateral: Secondary | ICD-10-CM | POA: Diagnosis not present

## 2015-04-18 DIAGNOSIS — H401213 Low-tension glaucoma, right eye, severe stage: Secondary | ICD-10-CM | POA: Diagnosis not present

## 2015-04-18 DIAGNOSIS — H401221 Low-tension glaucoma, left eye, mild stage: Secondary | ICD-10-CM | POA: Diagnosis not present

## 2015-05-09 ENCOUNTER — Encounter: Payer: Self-pay | Admitting: Nurse Practitioner

## 2015-05-09 ENCOUNTER — Ambulatory Visit (INDEPENDENT_AMBULATORY_CARE_PROVIDER_SITE_OTHER): Payer: Medicare Other | Admitting: Nurse Practitioner

## 2015-05-09 VITALS — BP 117/65 | HR 80 | Temp 98.0°F | Ht 65.0 in | Wt 168.0 lb

## 2015-05-09 DIAGNOSIS — I10 Essential (primary) hypertension: Secondary | ICD-10-CM | POA: Diagnosis not present

## 2015-05-09 DIAGNOSIS — F411 Generalized anxiety disorder: Secondary | ICD-10-CM

## 2015-05-09 DIAGNOSIS — E785 Hyperlipidemia, unspecified: Secondary | ICD-10-CM | POA: Diagnosis not present

## 2015-05-09 DIAGNOSIS — M810 Age-related osteoporosis without current pathological fracture: Secondary | ICD-10-CM

## 2015-05-09 DIAGNOSIS — E1142 Type 2 diabetes mellitus with diabetic polyneuropathy: Secondary | ICD-10-CM

## 2015-05-09 DIAGNOSIS — Z1212 Encounter for screening for malignant neoplasm of rectum: Secondary | ICD-10-CM | POA: Diagnosis not present

## 2015-05-09 DIAGNOSIS — Z6828 Body mass index (BMI) 28.0-28.9, adult: Secondary | ICD-10-CM | POA: Insufficient documentation

## 2015-05-09 DIAGNOSIS — Z6827 Body mass index (BMI) 27.0-27.9, adult: Secondary | ICD-10-CM | POA: Diagnosis not present

## 2015-05-09 LAB — POCT GLYCOSYLATED HEMOGLOBIN (HGB A1C): Hemoglobin A1C: 7.4

## 2015-05-09 MED ORDER — GLIPIZIDE ER 10 MG PO TB24: 10.0000 mg | ORAL_TABLET | Freq: Every day | ORAL | Status: DC

## 2015-05-09 MED ORDER — DEXLANSOPRAZOLE 60 MG PO CPDR: 1.0000 | DELAYED_RELEASE_CAPSULE | Freq: Every day | ORAL | Status: DC

## 2015-05-09 NOTE — Progress Notes (Signed)
Subjective:    Patient ID: GRISELDA BRAMBLETT, female    DOB: 1941-09-20, 74 y.o.   MRN: [id number]  Patient here today for follow- up- no changes since last visit. Her only complaint today is fatigue, which she has been complaining about for awhile.  Diabetes She presents for her follow-up diabetic visit. She has type 2 diabetes mellitus. No MedicAlert identification noted. Her disease course has been stable. Pertinent negatives for hypoglycemia include no headaches. Pertinent negatives for diabetes include no chest pain, no polydipsia, no polyphagia, no polyuria and no weakness. There are no hypoglycemic complications. There are no diabetic complications. Risk factors for coronary artery disease include dyslipidemia, hypertension and post-menopausal. Current diabetic treatment includes oral agent (dual therapy). She is compliant with treatment most of the time. Her weight is stable. When asked about meal planning, she reported none. She has not had a previous visit with a dietitian. She rarely participates in exercise. Her breakfast blood glucose is taken between 8-9 am. Her breakfast blood glucose range is generally 110-130 mg/dl. Her overall blood glucose range is 110-130 mg/dl. An ACE inhibitor/angiotensin II receptor blocker is not being taken. She does not see a podiatrist.Eye exam is not current.  Hypertension This is a chronic problem. The problem is unchanged. The problem is controlled. Pertinent negatives include no chest pain, headaches, palpitations or shortness of breath. Risk factors for coronary artery disease include dyslipidemia, diabetes mellitus, post-menopausal state and sedentary lifestyle. Past treatments include nothing. The current treatment provides moderate improvement. Compliance problems include diet and exercise.   Hyperlipidemia This is a chronic problem. The current episode started more than 1 year ago. The problem is uncontrolled. Recent lipid tests were reviewed and are  variable. Associated symptoms include myalgias. Pertinent negatives include no chest pain or shortness of breath. Current antihyperlipidemic treatment includes diet change. The current treatment provides mild improvement of lipids. Compliance problems include adherence to diet and adherence to exercise.  Risk factors for coronary artery disease include diabetes mellitus, dyslipidemia, hypertension and post-menopausal.  osteporosis Fosamax weekly without side effects- Very little weight bearing exercises. Gad Only takes alprazolam once in a while not daily.    Review of Systems  Constitutional: Negative.   HENT: Negative.   Respiratory: Negative for shortness of breath.   Cardiovascular: Negative for chest pain and palpitations.  Endocrine: Negative for polydipsia, polyphagia and polyuria.  Musculoskeletal: Positive for myalgias.  Neurological: Negative for weakness and headaches.  Psychiatric/Behavioral: Negative.   All other systems reviewed and are negative.      Objective:   Physical Exam  Constitutional: She is oriented to person, place, and time. She appears well-developed and well-nourished.  HENT:  Nose: Nose normal.  Mouth/Throat: Oropharynx is clear and moist.  Right cerumen impaction   Eyes: EOM are normal.  Neck: Trachea normal, normal range of motion and full passive range of motion without pain. Neck supple. No JVD present. Carotid bruit is not present. No thyromegaly present.  Cardiovascular: Normal rate, regular rhythm, normal heart sounds and intact distal pulses.  Exam reveals no gallop and no friction rub.   No murmur heard. Pulmonary/Chest: Effort normal and breath sounds normal.  Abdominal: Soft. Bowel sounds are normal. She exhibits no distension and no mass. There is no tenderness.  Musculoskeletal: Normal range of motion.  Lymphadenopathy:    She has no cervical adenopathy.  Neurological: She is alert and oriented to person, place, and time. She has normal  reflexes.  Skin: Skin is warm and dry.  Psychiatric: She has a normal mood and affect. Her behavior is normal. Judgment and thought content normal.     BP 117/65 mmHg  Pulse 80  Temp(Src) 98 F (36.7 C) (Oral)  Ht 5' 5"  (1.651 m)  Wt 168 lb (76.204 kg)  BMI 27.96 kg/m2  Results for orders placed or performed in visit on 05/09/15  POCT glycosylated hemoglobin (Hb A1C)  Result Value Ref Range   Hemoglobin A1C 7.4          Assessment & Plan:  1. Hyperlipidemia Low fat diet - Lipid panel  2. Essential hypertension Do not add salt to diet - CMP14+EGFR  3. Type 2 diabetes mellitus with diabetic polyneuropathy Stricter carb counting - POCT glycosylated hemoglobin (Hb A1C) - glipiZIDE (GLIPIZIDE XL) 10 MG 24 hr tablet; Take 1 tablet (10 mg total) by mouth daily with breakfast.  Dispense: 90 tablet; Refill: 1  4. Osteoporosis weigh bearing exercises  5. GAD (generalized anxiety disorder) Stress management  6. BMI 27.0-27.9,adult Discussed diet and exercise for person with BMI >25 Will recheck weight in 3-6 months     Labs pending Health maintenance reviewed Diet and exercise encouraged Continue all meds Follow up  In 3 month    [place], FNP

## 2015-05-09 NOTE — Patient Instructions (Signed)
Bone Health Our bones do many things. They provide structure, protect organs, anchor muscles, and store calcium. Adequate calcium in your diet and weight-bearing physical activity help build strong bones, improve bone amounts, and may reduce the risk of weakening of bones (osteoporosis) later in life. PEAK BONE MASS By age 75, the average woman has acquired most of her skeletal bone mass. A large decline occurs in older adults which increases the risk of osteoporosis. In women this occurs around the time of menopause. It is important for young girls to reach their peak bone mass in order to maintain bone health throughout life. A person with high bone mass as a young adult will be more likely to have a higher bone mass later in life. Not enough calcium consumption and physical activity early on could result in a failure to achieve optimum bone mass in adulthood. OSTEOPOROSIS Osteoporosis is a disease of the bones. It is defined as low bone mass with deterioration of bone structure. Osteoporosis leads to an increase risk of fractures with falls. These fractures commonly happen in the wrist, hip, and spine. While men and women of all ages and background can develop osteoporosis, some of the risk factors for osteoporosis are:  Female.  White.  Postmenopausal.  Older adults.  Small in body size.  Eating a diet low in calcium.  Physically inactive.  Smoking.  Use of some medications.  Family history. CALCIUM Calcium is a mineral needed by the body for healthy bones, teeth, and proper function of the heart, muscles, and nerves. The body cannot produce calcium so it must be absorbed through food. Good sources of calcium include:  Dairy products (low fat or nonfat milk, cheese, and yogurt).  Dark green leafy vegetables (bok choy and broccoli).  Calcium fortified foods (orange juice, cereal, bread, soy beverages, and tofu products).  Nuts (almonds). Recommended amounts of calcium vary  for individuals. RECOMMENDED CALCIUM INTAKES Age and Amount in mg per day  Children 1 to 3 years / 700 mg  Children 4 to 8 years / 1,000 mg  Children 9 to 13 years / 1,300 mg  Teens 14 to 18 years / 1,300 mg  Adults 19 to 50 years / 1,000 mg  Adult women 51 to 70 years / 1,200 mg  Adults 71 years and older / 1,200 mg  Pregnant and breastfeeding teens / 1,300 mg  Pregnant and breastfeeding adults / 1,000 mg Vitamin D also plays an important role in healthy bone development. Vitamin D helps in the absorption of calcium. WEIGHT-BEARING PHYSICAL ACTIVITY Regular physical activity has many positive health benefits. Benefits include strong bones. Weight-bearing physical activity early in life is important in reaching peak bone mass. Weight-bearing physical activities cause muscles and bones to work against gravity. Some examples of weight bearing physical activities include:  Walking, jogging, or running.  Field Hockey.  Jumping rope.  Dancing.  Soccer.  Tennis or Racquetball.  Stair climbing.  Basketball.  Hiking.  Weight lifting.  Aerobic fitness classes. Including weight-bearing physical activity into an exercise plan is a great way to keep bones healthy. Adults: Engage in at least 30 minutes of moderate physical activity on most, preferably all, days of the week. Children: Engage in at least 60 minutes of moderate physical activity on most, preferably all, days of the week. FOR MORE INFORMATION [place] Department of Agriculture, Center for Nutrition Policy and Promotion: www.cnpp.usda.gov National Osteoporosis Foundation: www.nof.org Document Released: 12/20/2003 Document Revised: 01/24/2013 Document Reviewed: 03/21/2009 ExitCare Patient Information   2015 [employer]. This information is not intended to replace advice given to you by your health care provider. Make sure you discuss any questions you have with your health care provider.  

## 2015-05-09 NOTE — Addendum Note (Signed)
Addended by: Chevis Pretty on: 05/09/2015 10:47 AM   Modules accepted: Orders

## 2015-05-10 ENCOUNTER — Encounter: Payer: Self-pay | Admitting: *Deleted

## 2015-05-10 LAB — CMP14+EGFR
A/G RATIO: 1.4 (ref 1.1–2.5)
ALBUMIN: 4 g/dL (ref 3.5–4.8)
ALK PHOS: 92 IU/L (ref 39–117)
ALT: 18 IU/L (ref 0–32)
AST: 15 IU/L (ref 0–40)
BUN/Creatinine Ratio: 13 (ref 11–26)
BUN: 13 mg/dL (ref 8–27)
Bilirubin Total: 0.5 mg/dL (ref 0.0–1.2)
CALCIUM: 9.6 mg/dL (ref 8.7–10.3)
CHLORIDE: 99 mmol/L (ref 97–108)
CO2: 26 mmol/L (ref 18–29)
Creatinine, Ser: 0.98 mg/dL (ref 0.57–1.00)
GFR calc non Af Amer: 57 mL/min/{1.73_m2} — ABNORMAL LOW (ref >59)
GFR, EST AFRICAN AMERICAN: 66 mL/min/{1.73_m2} (ref >59)
GLUCOSE: 183 mg/dL — AB (ref 65–99)
Globulin, Total: 2.8 g/dL (ref 1.5–4.5)
Potassium: 4.6 mmol/L (ref 3.5–5.2)
Sodium: 141 mmol/L (ref 134–144)
TOTAL PROTEIN: 6.8 g/dL (ref 6.0–8.5)

## 2015-05-10 LAB — LIPID PANEL
CHOL/HDL RATIO: 4.6 ratio — AB (ref 0.0–4.4)
CHOLESTEROL TOTAL: 183 mg/dL (ref 100–199)
HDL: 40 mg/dL (ref >39)
LDL CALC: 105 mg/dL — AB (ref 0–99)
Triglycerides: 192 mg/dL — ABNORMAL HIGH (ref 0–149)
VLDL Cholesterol Cal: 38 mg/dL (ref 5–40)

## 2015-05-16 ENCOUNTER — Other Ambulatory Visit: Payer: Medicare Other

## 2015-05-16 DIAGNOSIS — Z1212 Encounter for screening for malignant neoplasm of rectum: Secondary | ICD-10-CM

## 2015-05-18 LAB — FECAL OCCULT BLOOD, IMMUNOCHEMICAL: FECAL OCCULT BLD: NEGATIVE

## 2015-05-28 ENCOUNTER — Other Ambulatory Visit: Payer: Self-pay | Admitting: Nurse Practitioner

## 2015-05-28 ENCOUNTER — Other Ambulatory Visit: Payer: Self-pay | Admitting: *Deleted

## 2015-05-28 DIAGNOSIS — E1142 Type 2 diabetes mellitus with diabetic polyneuropathy: Secondary | ICD-10-CM

## 2015-05-28 MED ORDER — SAXAGLIPTIN HCL 5 MG PO TABS: 5.0000 mg | ORAL_TABLET | Freq: Every day | ORAL | Status: DC

## 2015-05-29 MED ORDER — SAXAGLIPTIN HCL 5 MG PO TABS: 5.0000 mg | ORAL_TABLET | Freq: Every day | ORAL | Status: DC

## 2015-05-29 NOTE — Telephone Encounter (Signed)
Patient aware that rx sent to pharmacy and her insurance would only agree to 7 day supply.

## 2015-05-29 NOTE — Telephone Encounter (Signed)
Should go ahead and get filled- may be just as cheap to get a months supply

## 2015-05-29 NOTE — Telephone Encounter (Signed)
Patient aware that we do not have samples. They said it would be 3-5 days before she received the rx. What do you recommend. It will cost her $46 for 7 pills. Please advise.

## 2015-06-04 ENCOUNTER — Encounter: Payer: Self-pay | Admitting: Gynecologic Oncology

## 2015-06-04 ENCOUNTER — Telehealth: Payer: Self-pay | Admitting: Nurse Practitioner

## 2015-06-04 ENCOUNTER — Ambulatory Visit: Payer: Medicare Other | Attending: Gynecologic Oncology | Admitting: Gynecologic Oncology

## 2015-06-04 ENCOUNTER — Telehealth: Payer: Self-pay

## 2015-06-04 ENCOUNTER — Other Ambulatory Visit (HOSPITAL_COMMUNITY)
Admission: RE | Admit: 2015-06-04 | Discharge: 2015-06-04 | Disposition: A | Discharge status: Home / Self Care | Payer: Medicare Other | Source: Ambulatory Visit | Attending: Pediatrics | Admitting: Pediatrics

## 2015-06-04 VITALS — BP 132/60 | HR 86 | Temp 98.4°F | Resp 18 | Ht 65.0 in | Wt 168.2 lb

## 2015-06-04 DIAGNOSIS — R197 Diarrhea, unspecified: Secondary | ICD-10-CM | POA: Diagnosis not present

## 2015-06-04 DIAGNOSIS — C541 Malignant neoplasm of endometrium: Secondary | ICD-10-CM | POA: Diagnosis not present

## 2015-06-04 DIAGNOSIS — Z124 Encounter for screening for malignant neoplasm of cervix: Secondary | ICD-10-CM | POA: Insufficient documentation

## 2015-06-04 NOTE — Telephone Encounter (Signed)
Neurology called to see if we could do a referral to GI  Dr Amedeo Plenty for persistant diarrhea

## 2015-06-04 NOTE — Telephone Encounter (Signed)
Referral made 

## 2015-06-04 NOTE — Patient Instructions (Signed)
Doing well.  Plan to follow up in one year or sooner if needed or if symptoms develop such as vaginal bleeding, abdominal pain, etc.  We will contact Dr. Amedeo Plenty' office to get you an appointment.  Please call for any questions or concerns.  We will also call you with the results of your pap smear from today.

## 2015-06-04 NOTE — Progress Notes (Signed)
Follow Up Note: Gyn-Onc  Catherine Cooper 74 y.o. female  CC:  Chief Complaint  Patient presents with  . Endometrial adenocarcinoma    Follow Up    HPI:  Catherine Cooper is a 74 year old female with a history of stage IA, grade 1 endometrioid adenocarcinoma.  She underwent a robotic hysterectomy, BSO, and bilateral pelvic lymph node dissection in March 2011.  Final pathology revealed a grade 1 endometrioid adenocarcinoma with 10% myometrial invasion, negative lymph nodes, negative adnexa, no lymphovascular space involvement, and negative washings.  She was last seen in August of 2015 with an unremarkable exam and normal pap smear.     Interval History:  Catherine Cooper presents today for continued follow up.  She has a new great grand-daughter due any day now and she enjoys visiting with her great grandson as well.  Tolerating diet with no nausea or emesis.  Appetite remains good with no early satiety.  She had to have her 4th toe of the right foot amputated due to "bone infection".  She states it was difficult after surgery because she was advised to stay off of her feet but she is doing better now.  She has intermittent shooting pains in the right foot that resolve after a few second.  She has been experiencing left lower back aches that radiate down her left leg at times.  She states it has been present for one month and she can notice it when bending over or with increased activity.  She has concerns about diarrhea that has been present "for quite awhile."  She has 4 to 5 episodes in a day and is up all night.  The diarrhea occurs 15 to 30 minutes after eating and she experiences cramping as well.  The sensation hits her all at once and she must find a bathroom immediately.  She states it is interfering with her quality of life and she has to plan her day around the possibility of the diarrhea occuring.  She has to go home right after eating and limits activities as well.  She was advised to take imodium  which helps some but then she "becomes constipated for two to three days then the diarrhea comes back more severe."  She has had an upper endoscopy and colonoscopy with Dr. Amedeo Plenty at [facility].  She reports abdominal cramping with the diarrhea.  She reports improvement with her left foot and Charcot arthropathy except for an episode of nerve pain along her arch on the left foot that is relieved with gabapentin intermittently.  She continues to have a dry cough with no sputum production with previous testing being negative. She continues to describe it as a "tickle" in her throat that catches her and makes her begin coughing.  She cannot relate anything to the onset of the cough but it occurs when driving, in church, etc.  She reports her blood glucose as being elevated over the past several months, reading 130 this am.  Her most recent A1C was 7.4, up from 7.2.  Her physical activity has been limited due to her recent right toe amputation.  No vaginal bleeding reported.  She denies having a provider breast examination this year and will ask about her at her next physical in October.  See below for detailed review of systems.  No other concerns voiced.    Review of Systems Constitutional: Feels well.  No fever, chills, unintentional weight gain or loss, early satiety, headaches, or visual changes.  Cardiovascular: No  chest pain, shortness of breath, or edema.  Pulmonary: Continued dry cough with no sputum production.  No wheeze.  No difficulty swallowing.  Gastrointestinal: Positive for moderate diarrhea interfering with quality of life.  No incontinence of stool.  No nausea, vomiting. No bright red blood per rectum or change in bowel movement.  Genitourinary: No frequency, urgency, hematuria, or dysuria. No vaginal bleeding or discharge.  Musculoskeletal: No myalgia or joint pain.  Intermittent left foot and right foot discomfort that resolves after a few seconds. Neurologic: No weakness,  numbness, or change in gait.  Psychology: No depression, anxiety, or insomnia.  Health Maintenance: Mammogram: Up to date, due in November Pap Smear: 05/2014 Colonoscopy: 2013  Current Meds:  Outpatient Encounter Prescriptions as of 06/04/2015  Medication Sig  . ADVOCATE LANCETS MISC   . ALPHAGAN P 0.1 % SOLN   . aspirin 81 MG tablet Take 81 mg by mouth every evening.   . Blood Glucose Calibration (TAI DOC CONTROL) NORMAL SOLN   . calcium citrate (CALCITRATE - DOSED IN MG ELEMENTAL CALCIUM) 950 MG tablet Take 200 mg of elemental calcium by mouth 2 (two) times daily.   . Cholecalciferol (VITAMIN D3) 2000 UNITS TABS Take 1 capsule by mouth daily.   Marland Kitchen CLEVER CHEK AUTO-CODE VOICE test strip   . dexlansoprazole (DEXILANT) 60 MG capsule Take 1 capsule (60 mg total) by mouth daily.  . ferrous gluconate (FERGON) 325 MG tablet Take 325 mg by mouth at bedtime.  . gabapentin (NEURONTIN) 100 MG capsule Take 1 capsule (100 mg total) by mouth daily.  Marland Kitchen glipiZIDE (GLIPIZIDE XL) 10 MG 24 hr tablet Take 1 tablet (10 mg total) by mouth daily with breakfast.  . ibuprofen (ADVIL,MOTRIN) 800 MG tablet Take 1 tablet (800 mg total) by mouth every 6 (six) hours as needed for pain.  Marland Kitchen ketotifen (ALAWAY) 0.025 % ophthalmic solution Place 1 drop into both eyes 2 (two) times daily as needed (itching).   Marland Kitchen L-Methylfolate-B6-B12 (METANX) 3-35-2 MG TABS Take 1 tablet by mouth daily.   Elmore Guise Devices (ADVOCATE LANCING DEVICE) MISC   . latanoprost (XALATAN) 0.005 % ophthalmic solution   . loperamide (IMODIUM A-D) 2 MG tablet Take 2 mg by mouth 4 (four) times daily as needed for diarrhea or loose stools.   Marland Kitchen LORazepam (ATIVAN) 0.5 MG tablet TAKE ONE TABLET BY MOUTH TWICE DAILY AS NEEDED (Patient taking differently: Take 0.5 mg by mouth 2 (two) times daily as needed for anxiety. )  . Multiple Vitamin (MULTI-VITAMIN DAILY PO) Take 1 tablet by mouth daily. Century Mature for adults 26+  . saxagliptin HCl (ONGLYZA) 5 MG  TABS tablet Take 1 tablet (5 mg total) by mouth daily.  . vitamin B-12 (CYANOCOBALAMIN) 1000 MCG tablet Take 1,000 mcg by mouth daily.     No facility-administered encounter medications on file as of 06/04/2015.    Allergy:  Allergies  Allergen Reactions  . Lipitor [Atorvastatin]     fatique and myalgia  . Penicillins Rash    Social Hx:   Social History   Social History  . Marital Status: Married    Spouse Name: N/A  . Number of Children: 3  . Years of Education: N/A   Occupational History  . retired     [employer]   Social History Main Topics  . Smoking status: Never Smoker   . Smokeless tobacco: Never Used  . Alcohol Use: No  . Drug Use: No  . Sexual Activity: Not on file  Other Topics Concern  . Not on file   Social History Narrative   2 healthy daughters and 1 son deceased (suicide @ 74)    Past Surgical Hx:  Past Surgical History  Procedure Laterality Date  .  vitrectomy  02/23/08    posterior; right eye  . Tubal ligation    . Foot surgery      Right foot / left heel  . Glaucome surgery right eye    . Cardiac catheterization Bilateral     cataract surgery w/ lens implant  . Skin graft Right 2007    foot  . Colonoscopy  2013  . Abdominal hysterectomy    . Amputation Right 02/14/2015    Procedure: 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;  Surgeon: Newt Minion, MD;  Location: [facility];  Service: Orthopedics;  Laterality: Right;    Past Medical Hx:  Past Medical History  Diagnosis Date  . Diabetes mellitus     type II  . Osteoporosis   . Glaucoma   . GAD (generalized anxiety disorder)   . GERD (gastroesophageal reflux disease)   . Hyperlipidemia     not on medications  . Endometrioid adenocarcinoma   . 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 Shingles 2004  . Cataracts, bilateral   . Anxiety   . Nephrolithiasis 2000  . Iron deficiency   . Diarrhea     attributed to diabetic meds  . Neuropathy      both feet    Family Hx:  Family History  Problem Relation Age of Onset  . Dementia Father   . Diabetes Father   . Coronary artery disease Father 69  . Hip fracture Father   . Atrial fibrillation Mother   . Osteoporosis Mother     Vitals:  Blood pressure 132/60, pulse 86, temperature 98.4 F (36.9 C), temperature source Oral, resp. rate 18, height _0  (1.651 m), weight 168 lb 3.2 oz (76.295 kg), SpO2 100 %.  Physical Exam:  General: Well developed, well nourished female in no acute distress. Alert and oriented x 3.  Head/ Neck: Supple without any enlargements.  Oropharynx clear without lesions.  Sclerae anicteric.  Lymph node survey: No cervical, supraclavicular, or inguinal adenopathy.  Cardiovascular: Regular rate and rhythm. S1 and S2 normal.  Lungs: Clear to auscultation bilaterally. No wheezes/crackles/rhonchi noted.  Skin: No rashes or lesions present. Back: No CVA tenderness.  Abdomen: Abdomen soft, non-tender and non-obese. Active bowel sounds in all quadrants. No evidence of a fluid wave or abdominal masses.  Lap sites without evidence of herniation or masses.  Genitourinary:    Vulva/vagina: Normal external female genitalia. No lesions.    Urethra: No lesions or masses.    Vagina: Atrophic without any lesions. No palpable masses. No vaginal bleeding or drainage noted.  Thin Prep Pap obtained.   Rectal: Good tone, no masses, no cul de sac nodularity.  Extremities: No bilateral cyanosis, edema, or clubbing.  Incision healing on right foot s/p right toe amputation.    Assessment/Plan:  Phinley Schall is a 74 year old with stage IA, grade 1 endometrioid adenocarcinoma who clinically has no evidence of recurrent disease. She is over 5 years out from her diagnosis in March 2011.  She will return to see [place] in 1 year or sooner if needed. We will contact her with the results of her pap smear from today.  We will arrange for her to meet with Dr. Amedeo Plenty about  evaluation and  management of diarrhea.  She will follow up with her other providers as scheduled.  She is advised to monitor the left lower back discomfort and to contact her PCP if the symptoms persist or worsen.  She is instructed to please call the office for any questions or concerns.   Reportable signs and symptoms reviewed.         Dinorah Masullo DEAL, NP 06/04/2015, 2:50 PM

## 2015-06-04 NOTE — Telephone Encounter (Signed)
Per Joylene John, NP, RN called [facility] at [phone] to see if Dr. Amedeo Plenty, GI, can see patient for persistent diarrhea. Representative answering the phone states they will contact Dr. Amedeo Plenty, who patient has seen in the past, for apt time/date and then they will contact the patient.

## 2015-06-05 NOTE — Telephone Encounter (Signed)
Patient aware.

## 2015-06-06 LAB — CYTOLOGY - PAP

## 2015-06-14 DIAGNOSIS — B351 Tinea unguium: Secondary | ICD-10-CM | POA: Diagnosis not present

## 2015-06-14 DIAGNOSIS — E1142 Type 2 diabetes mellitus with diabetic polyneuropathy: Secondary | ICD-10-CM | POA: Diagnosis not present

## 2015-06-14 DIAGNOSIS — L84 Corns and callosities: Secondary | ICD-10-CM | POA: Diagnosis not present

## 2015-06-20 ENCOUNTER — Telehealth: Payer: Self-pay | Admitting: Gynecologic Oncology

## 2015-06-20 NOTE — Telephone Encounter (Signed)
Called patient to see if she had heard about a date and time for her GI appt.  Several messages had been left with the specialty office at [facility] with no return call.  Patient stating she will be seeing Dr. Amedeo Plenty in [place] since he no longer comes to [facility] on Oct 6.  She states her husband will be having eye surgery at the end of Sept and they have several appts so she wanted to wait till the first of October.  She reports having an episode of diarrhea that started out black ("as usual with her iron") then turned to grainy beige.  She did not know since she was out of her dexilant for a few days if that caused the color change but after resuming the medication, her stools are back to dark with the iron.  She reports having the diarrhea ever since she has been retired and it interferes with her quality of life.  Advised to call if she would like [place] to call Dr. Amedeo Plenty office to get her in sooner.  She will call if she has another episode of stool color change or if she wants her appt moved up.  Advised to call for any questions or concerns.

## 2015-06-25 DIAGNOSIS — H401213 Low-tension glaucoma, right eye, severe stage: Secondary | ICD-10-CM | POA: Diagnosis not present

## 2015-06-25 DIAGNOSIS — H401221 Low-tension glaucoma, left eye, mild stage: Secondary | ICD-10-CM | POA: Diagnosis not present

## 2015-07-18 ENCOUNTER — Encounter: Payer: Self-pay | Admitting: Family Medicine

## 2015-07-18 ENCOUNTER — Ambulatory Visit (INDEPENDENT_AMBULATORY_CARE_PROVIDER_SITE_OTHER): Payer: Medicare Other | Admitting: Family Medicine

## 2015-07-18 VITALS — BP 137/79 | HR 85 | Temp 97.5°F | Ht 65.0 in | Wt 168.4 lb

## 2015-07-18 DIAGNOSIS — Z23 Encounter for immunization: Secondary | ICD-10-CM

## 2015-07-18 DIAGNOSIS — Z0000 Encounter for general adult medical examination without abnormal findings: Secondary | ICD-10-CM

## 2015-07-18 MED ORDER — ADVOCATE LANCETS MISC: 1.0000 | Freq: Every day | Status: DC

## 2015-07-18 MED ORDER — GLUCOSE BLOOD VI STRP: ORAL_STRIP | Status: DC

## 2015-07-18 MED ORDER — ADVOCATE LANCING DEVICE MISC: 1.0000 | Freq: Every day | Status: DC

## 2015-07-18 MED ORDER — TAI DOC CONTROL NORMAL VI SOLN: 1.0000 | Freq: Every day | Status: DC

## 2015-07-18 NOTE — Progress Notes (Signed)
  Subjective:   Catherine Cooper is a 74 y.o. female who presents for Medicare Annual (Subsequent) preventive examination.  Review of Systems:  Review of Systems  Constitutional: Negative for fever and chills.  HENT: Negative for ear pain and tinnitus.   Eyes: Negative for blurred vision and pain.  Respiratory: Negative for cough, shortness of breath and wheezing.   Cardiovascular: Negative for chest pain, palpitations and leg swelling.  Gastrointestinal: Positive for diarrhea (Intermittent, goes to see GI tomorrow.). Negative for abdominal pain, constipation, blood in stool and melena.  Genitourinary: Negative for dysuria and hematuria.  Musculoskeletal: Negative for myalgias, back pain and joint pain.  Skin: Negative for rash.  Neurological: Negative for dizziness, sensory change, focal weakness, weakness and headaches.  Psychiatric/Behavioral: Negative for depression and suicidal ideas.     Cardiac Risk Factors include: advanced age (>55men, >65 women);diabetes mellitus;sedentary lifestyle     Objective:     Vitals: BP 137/79 mmHg  Pulse 85  Temp(Src) 97.5 F (36.4 C) (Oral)  Ht 5' 5" (1.651 m)  Wt 168 lb 6.4 oz (76.386 kg)  BMI 28.02 kg/m2  Tobacco History  Smoking status  . Never Smoker   Smokeless tobacco  . Never Used     Counseling given: Not Answered   Past Medical History  Diagnosis Date  . Diabetes mellitus     type II  . Osteoporosis   . Glaucoma   . GAD (generalized anxiety disorder)   . GERD (gastroesophageal reflux disease)   . Hyperlipidemia     not on medications  . Endometrioid adenocarcinoma   . 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 Shingles 2004  . Cataracts, bilateral   . Anxiety   . Nephrolithiasis 2000  . Iron deficiency   . Diarrhea     attributed to diabetic meds  . Neuropathy (HCC)     both feet   Past Surgical History  Procedure Laterality Date  .  vitrectomy  02/23/08   posterior; right eye  . Tubal ligation    . Foot surgery      Right foot / left heel  . Glaucome surgery right eye    . Cardiac catheterization Bilateral     cataract surgery w/ lens implant  . Skin graft Right 2007    foot  . Colonoscopy  2013  . Abdominal hysterectomy    . 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 Dementia Father   . Diabetes Father   . Coronary artery disease Father 68  . Hip fracture Father   . Atrial fibrillation Mother   . Osteoporosis Mother    History  Sexual Activity  . Sexual Activity: Not on file    Outpatient Encounter Prescriptions as of 07/18/2015  Medication Sig  . ADVOCATE LANCETS MISC   . ALPHAGAN P 0.1 % SOLN Place 1 drop into both eyes 2 (two) times daily.   . aspirin 81 MG tablet Take 81 mg by mouth every evening.   . Blood Glucose Calibration (TAI DOC CONTROL) NORMAL SOLN   . calcium citrate (CALCITRATE - DOSED IN MG ELEMENTAL CALCIUM) 950 MG tablet Take 200 mg of elemental calcium by mouth 2 (two) times daily.   . Cholecalciferol (VITAMIN D3) 2000 UNITS TABS   Take 1 capsule by mouth daily.   . CLEVER CHEK AUTO-CODE VOICE test strip   . dexlansoprazole (DEXILANT) 60 MG capsule Take 1 capsule (60 mg total) by mouth daily.  . ferrous gluconate (FERGON) 325 MG tablet Take 325 mg by mouth at bedtime.  . gabapentin (NEURONTIN) 100 MG capsule Take 1 capsule (100 mg total) by mouth daily.  . glipiZIDE (GLIPIZIDE XL) 10 MG 24 hr tablet Take 1 tablet (10 mg total) by mouth daily with breakfast.  . ibuprofen (ADVIL,MOTRIN) 800 MG tablet Take 1 tablet (800 mg total) by mouth every 6 (six) hours as needed for pain.  . ketotifen (ALAWAY) 0.025 % ophthalmic solution Place 1 drop into both eyes 2 (two) times daily as needed (itching).   . L-Methylfolate-B6-B12 (METANX) 3-35-2 MG TABS  Take 1 tablet by mouth daily.   . Lancet Devices (ADVOCATE LANCING DEVICE) MISC   . latanoprost (XALATAN) 0.005 % ophthalmic solution   . loperamide (IMODIUM A-D) 2 MG tablet Take 2 mg by mouth 4 (four) times daily as needed for diarrhea or loose stools.   . LORazepam (ATIVAN) 0.5 MG tablet TAKE ONE TABLET BY MOUTH TWICE DAILY AS NEEDED (Patient taking differently: Take 0.5 mg by mouth 2 (two) times daily as needed for anxiety. )  . Multiple Vitamin (MULTI-VITAMIN DAILY PO) Take 1 tablet by mouth daily. Century Mature for adults 50+  . saxagliptin HCl (ONGLYZA) 5 MG TABS tablet Take 1 tablet (5 mg total) by mouth daily.  . vitamin B-12 (CYANOCOBALAMIN) 1000 MCG tablet Take 1,000 mcg by mouth daily.     No facility-administered encounter medications on file as of 07/18/2015.    Activities of Daily Living In your present state of health, do you have any difficulty performing the following activities: 07/18/2015 05/09/2015  Hearing? N N  Vision? N N  Difficulty concentrating or making decisions? N N  Walking or climbing stairs? Y N  Dressing or bathing? N N  Doing errands, shopping? N N  Preparing Food and eating ? N -  Using the Toilet? N -  In the past six months, have you accidently leaked urine? Y -  Do you have problems with loss of bowel control? Y -  Managing your Medications? N -  Managing your Finances? N -  Housekeeping or managing your Housekeeping? N -    Patient Care Team: Mary-Margaret Martin, FNP as PCP - General (Nurse Practitioner) Cody Drake, DPM as Consulting Physician (Podiatry) Gary A Rankin, MD as Consulting Physician (Ophthalmology) Kathryn Hecker, MD as Consulting Physician (Ophthalmology) Melissa D Cross, NP as Nurse Practitioner (Gynecologic Oncology) John Hayes, MD as Consulting Physician (Gastroenterology) Marcus Duda V, MD as Consulting Physician (Orthopedic Surgery)    Assessment:    Problem List Items Addressed This Visit    None    Visit Diagnoses     Routine history and physical examination of adult    -  Primary    Did well on all screenings.       Exercise Activities and Dietary recommendations Current Exercise Habits:: The patient does not participate in regular exercise at present  Goals    None     Fall Risk Fall Risk  07/18/2015 05/09/2015 01/31/2015 10/25/2014 07/20/2014  Falls in the past year? No No No No No   Depression Screen PHQ 2/9 Scores 07/18/2015 05/09/2015 01/31/2015 10/25/2014  PHQ - 2 Score 1 0 0 0     Cognitive Testing MMSE - Mini Mental State Exam 07/18/2015  Orientation   to time 5  Orientation to Place 5  Registration 3  Attention/ Calculation 5  Recall 3  Language- name 2 objects 2  Language- repeat 1  Language- follow 3 step command 3  Language- read & follow direction 1  Write a sentence 1  Copy design 1  Total score 30    Immunization History  Administered Date(s) Administered  . Influenza Whole 08/12/2010  . Influenza,inj,Quad PF,36+ Mos 07/04/2013, 07/20/2014  . Pneumococcal Conjugate-13 01/31/2015  . Pneumococcal Polysaccharide-23 08/12/2010  . Zoster 04/12/2014   Screening Tests Health Maintenance  Topic Date Due  . URINE MICROALBUMIN  07/21/2015  . MAMMOGRAM  09/12/2015  . OPHTHALMOLOGY EXAM  09/22/2015  . DEXA SCAN  11/02/2015  . HEMOGLOBIN A1C  11/09/2015  . FOOT EXAM  05/08/2016  . INFLUENZA VACCINE  05/13/2016  . COLON CANCER SCREENING ANNUAL FOBT  05/18/2016  . TETANUS/TDAP  02/10/2021  . COLONOSCOPY  08/02/2022  . ZOSTAVAX  Completed  . PNA vac Low Risk Adult  Completed      Plan:    see assessment  During the course of the visit the patient was educated and counseled about the following appropriate screening and preventive services:   Vaccines to include Pneumoccal, Influenza, Hepatitis B, Td, Zostavax, HCV  Electrocardiogram  Cardiovascular Disease  Colorectal cancer screening  Bone density screening  Diabetes screening  Glaucoma  screening  Mammography/PAP  Nutrition counseling   Patient Instructions (the written plan) was given to the patient.   Worthy Rancher, MD  07/18/2015

## 2015-07-20 ENCOUNTER — Telehealth: Payer: Self-pay | Admitting: Nurse Practitioner

## 2015-07-20 NOTE — Telephone Encounter (Signed)
Pt was having issues with [facility] and getting a new blood sugar monitor & supplies. In calling [facility] they are not sure of why a message was left at the pt's home today, her order has been shipped. Pt aware.

## 2015-07-31 ENCOUNTER — Encounter: Payer: Self-pay | Admitting: Nurse Practitioner

## 2015-08-01 DIAGNOSIS — R197 Diarrhea, unspecified: Secondary | ICD-10-CM | POA: Diagnosis not present

## 2015-08-28 ENCOUNTER — Encounter: Payer: Self-pay | Admitting: Nurse Practitioner

## 2015-08-28 ENCOUNTER — Ambulatory Visit (INDEPENDENT_AMBULATORY_CARE_PROVIDER_SITE_OTHER): Payer: Medicare Other | Admitting: Nurse Practitioner

## 2015-08-28 VITALS — BP 133/75 | HR 81 | Temp 97.4°F | Ht 65.0 in | Wt 170.8 lb

## 2015-08-28 DIAGNOSIS — F411 Generalized anxiety disorder: Secondary | ICD-10-CM | POA: Diagnosis not present

## 2015-08-28 DIAGNOSIS — M810 Age-related osteoporosis without current pathological fracture: Secondary | ICD-10-CM

## 2015-08-28 DIAGNOSIS — R5382 Chronic fatigue, unspecified: Secondary | ICD-10-CM

## 2015-08-28 DIAGNOSIS — Z6828 Body mass index (BMI) 28.0-28.9, adult: Secondary | ICD-10-CM | POA: Diagnosis not present

## 2015-08-28 DIAGNOSIS — I10 Essential (primary) hypertension: Secondary | ICD-10-CM

## 2015-08-28 DIAGNOSIS — E785 Hyperlipidemia, unspecified: Secondary | ICD-10-CM | POA: Diagnosis not present

## 2015-08-28 DIAGNOSIS — E1142 Type 2 diabetes mellitus with diabetic polyneuropathy: Secondary | ICD-10-CM | POA: Diagnosis not present

## 2015-08-28 LAB — POCT GLYCOSYLATED HEMOGLOBIN (HGB A1C): HEMOGLOBIN A1C: 7.3

## 2015-08-28 LAB — POCT UA - MICROALBUMIN: Microalbumin Ur, POC: 20 mg/L

## 2015-08-28 NOTE — Patient Instructions (Signed)
Health Maintenance, Female Adopting a healthy lifestyle and getting preventive care can go a long way to promote health and wellness. Talk with your health care provider about what schedule of regular examinations is right for you. This is a good chance for you to check in with your provider about disease prevention and staying healthy. In between checkups, there are plenty of things you can do on your own. Experts have done a lot of research about which lifestyle changes and preventive measures are most likely to keep you healthy. Ask your health care provider for more information. WEIGHT AND DIET  Eat a healthy diet  Be sure to include plenty of vegetables, fruits, low-fat dairy products, and lean protein.  Do not eat a lot of foods high in solid fats, added sugars, or salt.  Get regular exercise. This is one of the most important things you can do for your health.  Most adults should exercise for at least 150 minutes each week. The exercise should increase your heart rate and make you sweat (moderate-intensity exercise).  Most adults should also do strengthening exercises at least twice a week. This is in addition to the moderate-intensity exercise.  Maintain a healthy weight  Body mass index (BMI) is a measurement that can be used to identify possible weight problems. It estimates body fat based on height and weight. Your health care provider can help determine your BMI and help you achieve or maintain a healthy weight.  For females 20 years of age and older:   A BMI below 18.5 is considered underweight.  A BMI of 18.5 to 24.9 is normal.  A BMI of 25 to 29.9 is considered overweight.  A BMI of 30 and above is considered obese.  Watch levels of cholesterol and blood lipids  You should start having your blood tested for lipids and cholesterol at 75 years of age, then have this test every 5 years.  You may need to have your cholesterol levels checked more often if:  Your lipid  or cholesterol levels are high.  You are older than 75 years of age.  You are at high risk for heart disease.  CANCER SCREENING   Lung Cancer  Lung cancer screening is recommended for adults 55-80 years old who are at high risk for lung cancer because of a history of smoking.  A yearly low-dose CT scan of the lungs is recommended for people who:  Currently smoke.  Have quit within the past 15 years.  Have at least a 30-pack-year history of smoking. A pack year is smoking an average of one pack of cigarettes a day for 1 year.  Yearly screening should continue until it has been 15 years since you quit.  Yearly screening should stop if you develop a health problem that would prevent you from having lung cancer treatment.  Breast Cancer  Practice breast self-awareness. This means understanding how your breasts normally appear and feel.  It also means doing regular breast self-exams. Let your health care provider know about any changes, no matter how small.  If you are in your 20s or 30s, you should have a clinical breast exam (CBE) by a health care provider every 1-3 years as part of a regular health exam.  If you are 40 or older, have a CBE every year. Also consider having a breast X-ray (mammogram) every year.  If you have a family history of breast cancer, talk to your health care provider about genetic screening.  If you   are at high risk for breast cancer, talk to your health care provider about having an MRI and a mammogram every year.  Breast cancer gene (BRCA) assessment is recommended for women who have family members with BRCA-related cancers. BRCA-related cancers include:  Breast.  Ovarian.  Tubal.  Peritoneal cancers.  Results of the assessment will determine the need for genetic counseling and BRCA1 and BRCA2 testing. Cervical Cancer Your health care provider may recommend that you be screened regularly for cancer of the pelvic organs (ovaries, uterus, and  vagina). This screening involves a pelvic examination, including checking for microscopic changes to the surface of your cervix (Pap test). You may be encouraged to have this screening done every 3 years, beginning at age 21.  For women ages 30-65, health care providers may recommend pelvic exams and Pap testing every 3 years, or they may recommend the Pap and pelvic exam, combined with testing for human papilloma virus (HPV), every 5 years. Some types of HPV increase your risk of cervical cancer. Testing for HPV may also be done on women of any age with unclear Pap test results.  Other health care providers may not recommend any screening for nonpregnant women who are considered low risk for pelvic cancer and who do not have symptoms. Ask your health care provider if a screening pelvic exam is right for you.  If you have had past treatment for cervical cancer or a condition that could lead to cancer, you need Pap tests and screening for cancer for at least 20 years after your treatment. If Pap tests have been discontinued, your risk factors (such as having a new sexual partner) need to be reassessed to determine if screening should resume. Some women have medical problems that increase the chance of getting cervical cancer. In these cases, your health care provider may recommend more frequent screening and Pap tests. Colorectal Cancer  This type of cancer can be detected and often prevented.  Routine colorectal cancer screening usually begins at 75 years of age and continues through 75 years of age.  Your health care provider may recommend screening at an earlier age if you have risk factors for colon cancer.  Your health care provider may also recommend using home test kits to check for hidden blood in the stool.  A small camera at the end of a tube can be used to examine your colon directly (sigmoidoscopy or colonoscopy). This is done to check for the earliest forms of colorectal  cancer.  Routine screening usually begins at age 50.  Direct examination of the colon should be repeated every 5-10 years through 75 years of age. However, you may need to be screened more often if early forms of precancerous polyps or small growths are found. Skin Cancer  Check your skin from head to toe regularly.  Tell your health care provider about any new moles or changes in moles, especially if there is a change in a mole's shape or color.  Also tell your health care provider if you have a mole that is larger than the size of a pencil eraser.  Always use sunscreen. Apply sunscreen liberally and repeatedly throughout the day.  Protect yourself by wearing long sleeves, pants, a wide-brimmed hat, and sunglasses whenever you are outside. HEART DISEASE, DIABETES, AND HIGH BLOOD PRESSURE   High blood pressure causes heart disease and increases the risk of stroke. High blood pressure is more likely to develop in:  People who have blood pressure in the high end   of the normal range (130-139/85-89 mm Hg).  People who are overweight or obese.  People who are African American.  If you are 38-23 years of age, have your blood pressure checked every 3-5 years. If you are 61 years of age or older, have your blood pressure checked every year. You should have your blood pressure measured twice--once when you are at a hospital or clinic, and once when you are not at a hospital or clinic. Record the average of the two measurements. To check your blood pressure when you are not at a hospital or clinic, you can use:  An automated blood pressure machine at a pharmacy.  A home blood pressure monitor.  If you are between 45 years and 39 years old, ask your health care provider if you should take aspirin to prevent strokes.  Have regular diabetes screenings. This involves taking a blood sample to check your fasting blood sugar level.  If you are at a normal weight and have a low risk for diabetes,  have this test once every three years after 75 years of age.  If you are overweight and have a high risk for diabetes, consider being tested at a younger age or more often. PREVENTING INFECTION  Hepatitis B  If you have a higher risk for hepatitis B, you should be screened for this virus. You are considered at high risk for hepatitis B if:  You were born in a country where hepatitis B is common. Ask your health care provider which countries are considered high risk.  Your parents were born in a high-risk country, and you have not been immunized against hepatitis B (hepatitis B vaccine).  You have HIV or AIDS.  You use needles to inject street drugs.  You live with someone who has hepatitis B.  You have had sex with someone who has hepatitis B.  You get hemodialysis treatment.  You take certain medicines for conditions, including cancer, organ transplantation, and autoimmune conditions. Hepatitis C  Blood testing is recommended for:  Everyone born from [birth year] through 1966.  Anyone with known risk factors for hepatitis C. Sexually transmitted infections (STIs)  You should be screened for sexually transmitted infections (STIs) including gonorrhea and chlamydia if:  You are sexually active and are younger than 75 years of age.  You are older than 75 years of age and your health care provider tells you that you are at risk for this type of infection.  Your sexual activity has changed since you were last screened and you are at an increased risk for chlamydia or gonorrhea. Ask your health care provider if you are at risk.  If you do not have HIV, but are at risk, it may be recommended that you take a prescription medicine daily to prevent HIV infection. This is called pre-exposure prophylaxis (PrEP). You are considered at risk if:  You are sexually active and do not regularly use condoms or know the HIV status of your partner(s).  You take drugs by injection.  You are sexually  active with a partner who has HIV. Talk with your health care provider about whether you are at high risk of being infected with HIV. If you choose to begin PrEP, you should first be tested for HIV. You should then be tested every 3 months for as long as you are taking PrEP.  PREGNANCY   If you are premenopausal and you may become pregnant, ask your health care provider about preconception counseling.  If you may  become pregnant, take 400 to 800 micrograms (mcg) of folic acid every day.  If you want to prevent pregnancy, talk to your health care provider about birth control (contraception). OSTEOPOROSIS AND MENOPAUSE   Osteoporosis is a disease in which the bones lose minerals and strength with aging. This can result in serious bone fractures. Your risk for osteoporosis can be identified using a bone density scan.  If you are 61 years of age or older, or if you are at risk for osteoporosis and fractures, ask your health care provider if you should be screened.  Ask your health care provider whether you should take a calcium or vitamin D supplement to lower your risk for osteoporosis.  Menopause may have certain physical symptoms and risks.  Hormone replacement therapy may reduce some of these symptoms and risks. Talk to your health care provider about whether hormone replacement therapy is right for you.  HOME CARE INSTRUCTIONS   Schedule regular health, dental, and eye exams.  Stay current with your immunizations.   Do not use any tobacco products including cigarettes, chewing tobacco, or electronic cigarettes.  If you are pregnant, do not drink alcohol.  If you are breastfeeding, limit how much and how often you drink alcohol.  Limit alcohol intake to no more than 1 drink per day for nonpregnant women. One drink equals 12 ounces of beer, 5 ounces of wine, or 1 ounces of hard liquor.  Do not use street drugs.  Do not share needles.  Ask your health care provider for help if  you need support or information about quitting drugs.  Tell your health care provider if you often feel depressed.  Tell your health care provider if you have ever been abused or do not feel safe at home.   This information is not intended to replace advice given to you by your health care provider. Make sure you discuss any questions you have with your health care provider.   Document Released: 04/14/2011 Document Revised: 10/20/2014 Document Reviewed: 08/31/2013 Elsevier Interactive Patient Education [employer].

## 2015-08-28 NOTE — Progress Notes (Signed)
Subjective:    Patient ID: Catherine Cooper, female    DOB: 03-17-1941, 74 y.o.   MRN: [id number]  Patient here today for follow- up- no changes since last visit. Her only complaint today is fatigue, which she has been complaining about for awhile.  Hyperlipidemia This is a chronic problem. The current episode started more than 1 year ago. The problem is uncontrolled. Recent lipid tests were reviewed and are variable. Associated symptoms include myalgias. Pertinent negatives include no chest pain or shortness of breath. Current antihyperlipidemic treatment includes diet change. The current treatment provides mild improvement of lipids. Compliance problems include adherence to diet and adherence to exercise.  Risk factors for coronary artery disease include diabetes mellitus, dyslipidemia, hypertension and post-menopausal.  Hypertension This is a chronic problem. The current episode started more than 1 year ago. The problem is unchanged. The problem is controlled. Pertinent negatives include no chest pain, headaches, palpitations or shortness of breath. Risk factors for coronary artery disease include dyslipidemia, diabetes mellitus, post-menopausal state and sedentary lifestyle. Past treatments include nothing. The current treatment provides moderate improvement. Compliance problems include diet and exercise.   Diabetes She presents for her follow-up diabetic visit. She has type 2 diabetes mellitus. No MedicAlert identification noted. Her disease course has been stable. Pertinent negatives for hypoglycemia include no headaches. Pertinent negatives for diabetes include no chest pain, no polydipsia, no polyphagia, no polyuria and no weakness. There are no hypoglycemic complications. There are no diabetic complications. Risk factors for coronary artery disease include dyslipidemia, hypertension and post-menopausal. Current diabetic treatment includes oral agent (dual therapy). She is compliant with treatment  most of the time. Her weight is stable. When asked about meal planning, she reported none. She has not had a previous visit with a dietitian. She rarely participates in exercise. Her breakfast blood glucose is taken between 8-9 am. Her breakfast blood glucose range is generally 110-130 mg/dl. Her overall blood glucose range is 110-130 mg/dl. An ACE inhibitor/angiotensin II receptor blocker is not being taken. She does not see a podiatrist.Eye exam is not current.  osteporosis Fosamax weekly without side effects- Very little weight bearing exercises. Gad Only takes alprazolam once in a while not daily.    Review of Systems  Constitutional: Negative.   HENT: Negative.   Respiratory: Negative for shortness of breath.   Cardiovascular: Negative for chest pain and palpitations.  Endocrine: Negative for polydipsia, polyphagia and polyuria.  Musculoskeletal: Positive for myalgias.  Neurological: Negative for weakness and headaches.  Psychiatric/Behavioral: Negative.   All other systems reviewed and are negative.      Objective:   Physical Exam  Constitutional: She is oriented to person, place, and time. She appears well-developed and well-nourished.  HENT:  Nose: Nose normal.  Mouth/Throat: Oropharynx is clear and moist.  Right cerumen impaction   Eyes: EOM are normal.  Neck: Trachea normal, normal range of motion and full passive range of motion without pain. Neck supple. No JVD present. Carotid bruit is not present. No thyromegaly present.  Cardiovascular: Normal rate, regular rhythm, normal heart sounds and intact distal pulses.  Exam reveals no gallop and no friction rub.   No murmur heard. Pulmonary/Chest: Effort normal and breath sounds normal.  Abdominal: Soft. Bowel sounds are normal. She exhibits no distension and no mass. There is no tenderness.  Musculoskeletal: Normal range of motion.  Lymphadenopathy:    She has no cervical adenopathy.  Neurological: She is alert and  oriented to person, place, and time. She has  normal reflexes.  Skin: Skin is warm and dry.  Psychiatric: She has a normal mood and affect. Her behavior is normal. Judgment and thought content normal.     BP 133/75 mmHg  Pulse 81  Temp(Src) 97.4 F (36.3 C) (Oral)  Ht 5' 5"  (1.651 m)  Wt 170 lb 12.8 oz (77.474 kg)  BMI 28.42 kg/m2  Results for orders placed or performed in visit on 08/28/15  POCT glycosylated hemoglobin (Hb A1C)  Result Value Ref Range   Hemoglobin A1C 7.3   POCT UA - Microalbumin  Result Value Ref Range   Microalbumin Ur, POC 20 mg/L         Assessment & Plan:  1. Hyperlipidemia Low fat diet - Lipid panel  2. Essential hypertension Do not add saltt o diet - CMP14+EGFR  3. Type 2 diabetes mellitus with diabetic polyneuropathy, without long-term current use of insulin (HCC) Continue to watch carbs in diet - POCT glycosylated hemoglobin (Hb A1C) - POCT UA - Microalbumin - Microalbumin, urine  4. BMI 28.0-28.9,adult Discussed diet and exercise for person with BMI >25 Will recheck weight in 3-6 months   5. Chronic fatigue  6. GAD (generalized anxiety disorder) Stress management  7. Osteoporosis Weight bearing exercises    Labs pending Health maintenance reviewed Diet and exercise encouraged Continue all meds Follow up  In 3 month   [place], FNP

## 2015-08-29 ENCOUNTER — Ambulatory Visit: Payer: Medicare Other | Admitting: Nurse Practitioner

## 2015-08-29 LAB — CMP14+EGFR
A/G RATIO: 1.5 (ref 1.1–2.5)
ALBUMIN: 3.8 g/dL (ref 3.5–4.8)
ALK PHOS: 95 IU/L (ref 39–117)
ALT: 27 IU/L (ref 0–32)
AST: 21 IU/L (ref 0–40)
BILIRUBIN TOTAL: 0.4 mg/dL (ref 0.0–1.2)
BUN / CREAT RATIO: 14 (ref 11–26)
BUN: 13 mg/dL (ref 8–27)
CHLORIDE: 103 mmol/L (ref 97–106)
CO2: 24 mmol/L (ref 18–29)
Calcium: 8.8 mg/dL (ref 8.7–10.3)
Creatinine, Ser: 0.96 mg/dL (ref 0.57–1.00)
GFR calc non Af Amer: 58 mL/min/{1.73_m2} — ABNORMAL LOW (ref >59)
GFR, EST AFRICAN AMERICAN: 67 mL/min/{1.73_m2} (ref >59)
GLUCOSE: 187 mg/dL — AB (ref 65–99)
Globulin, Total: 2.6 g/dL (ref 1.5–4.5)
POTASSIUM: 4.9 mmol/L (ref 3.5–5.2)
SODIUM: 141 mmol/L (ref 136–144)
TOTAL PROTEIN: 6.4 g/dL (ref 6.0–8.5)

## 2015-08-29 LAB — LIPID PANEL
CHOLESTEROL TOTAL: 154 mg/dL (ref 100–199)
Chol/HDL Ratio: 4.2 ratio units (ref 0.0–4.4)
HDL: 37 mg/dL — ABNORMAL LOW (ref >39)
LDL Calculated: 73 mg/dL (ref 0–99)
Triglycerides: 221 mg/dL — ABNORMAL HIGH (ref 0–149)
VLDL CHOLESTEROL CAL: 44 mg/dL — AB (ref 5–40)

## 2015-08-29 LAB — MICROALBUMIN, URINE: MICROALBUM., U, RANDOM: 7 ug/mL

## 2015-08-30 DIAGNOSIS — L84 Corns and callosities: Secondary | ICD-10-CM | POA: Diagnosis not present

## 2015-08-30 DIAGNOSIS — B351 Tinea unguium: Secondary | ICD-10-CM | POA: Diagnosis not present

## 2015-08-30 DIAGNOSIS — E113511 Type 2 diabetes mellitus with proliferative diabetic retinopathy with macular edema, right eye: Secondary | ICD-10-CM | POA: Diagnosis not present

## 2015-08-30 DIAGNOSIS — E113412 Type 2 diabetes mellitus with severe nonproliferative diabetic retinopathy with macular edema, left eye: Secondary | ICD-10-CM | POA: Diagnosis not present

## 2015-08-30 DIAGNOSIS — E1142 Type 2 diabetes mellitus with diabetic polyneuropathy: Secondary | ICD-10-CM | POA: Diagnosis not present

## 2015-08-30 LAB — HM DIABETES EYE EXAM

## 2015-09-12 DIAGNOSIS — R197 Diarrhea, unspecified: Secondary | ICD-10-CM | POA: Diagnosis not present

## 2015-09-21 ENCOUNTER — Other Ambulatory Visit: Payer: Self-pay | Admitting: *Deleted

## 2015-09-21 MED ORDER — DEXLANSOPRAZOLE 60 MG PO CPDR: 1.0000 | DELAYED_RELEASE_CAPSULE | Freq: Every day | ORAL | Status: DC

## 2015-10-11 ENCOUNTER — Other Ambulatory Visit: Payer: Self-pay | Admitting: Nurse Practitioner

## 2015-10-12 DIAGNOSIS — Z1231 Encounter for screening mammogram for malignant neoplasm of breast: Secondary | ICD-10-CM | POA: Diagnosis not present

## 2015-10-12 LAB — HM MAMMOGRAPHY: HM Mammogram: NEGATIVE

## 2015-10-12 NOTE — Telephone Encounter (Signed)
Please call in ativan with 1 refills 

## 2015-10-12 NOTE — Telephone Encounter (Signed)
rx called in

## 2015-10-12 NOTE — Telephone Encounter (Signed)
Last filled 06/14/15, last seen 08/28/15. Call in at [facility]

## 2015-10-18 ENCOUNTER — Encounter: Payer: Self-pay | Admitting: *Deleted

## 2015-11-08 DIAGNOSIS — E1142 Type 2 diabetes mellitus with diabetic polyneuropathy: Secondary | ICD-10-CM | POA: Diagnosis not present

## 2015-11-08 DIAGNOSIS — B351 Tinea unguium: Secondary | ICD-10-CM | POA: Diagnosis not present

## 2015-11-08 DIAGNOSIS — L84 Corns and callosities: Secondary | ICD-10-CM | POA: Diagnosis not present

## 2015-11-19 ENCOUNTER — Telehealth: Payer: Self-pay | Admitting: Nurse Practitioner

## 2015-11-19 NOTE — Telephone Encounter (Signed)
I do not see on med list. i changed the pharmacy to [facility]

## 2015-11-20 MED ORDER — LINAGLIPTIN 5 MG PO TABS: 5.0000 mg | ORAL_TABLET | Freq: Every day | ORAL | Status: DC

## 2015-11-28 ENCOUNTER — Telehealth: Payer: Self-pay | Admitting: Family Medicine

## 2015-11-28 NOTE — Telephone Encounter (Signed)
She does not need this done now. [facility] is sending a new rx for a meter and strips

## 2015-12-05 DIAGNOSIS — R197 Diarrhea, unspecified: Secondary | ICD-10-CM | POA: Diagnosis not present

## 2015-12-05 DIAGNOSIS — K219 Gastro-esophageal reflux disease without esophagitis: Secondary | ICD-10-CM | POA: Diagnosis not present

## 2015-12-05 DIAGNOSIS — R143 Flatulence: Secondary | ICD-10-CM | POA: Diagnosis not present

## 2015-12-12 ENCOUNTER — Encounter: Payer: Self-pay | Admitting: Nurse Practitioner

## 2015-12-12 ENCOUNTER — Ambulatory Visit (INDEPENDENT_AMBULATORY_CARE_PROVIDER_SITE_OTHER): Payer: Medicare Other | Admitting: Nurse Practitioner

## 2015-12-12 ENCOUNTER — Ambulatory Visit: Payer: Medicare Other

## 2015-12-12 VITALS — BP 130/70 | HR 182 | Temp 97.6°F | Ht 65.0 in | Wt 171.4 lb

## 2015-12-12 DIAGNOSIS — R350 Frequency of micturition: Secondary | ICD-10-CM | POA: Diagnosis not present

## 2015-12-12 DIAGNOSIS — F411 Generalized anxiety disorder: Secondary | ICD-10-CM | POA: Diagnosis not present

## 2015-12-12 DIAGNOSIS — N183 Chronic kidney disease, stage 3 unspecified: Secondary | ICD-10-CM | POA: Insufficient documentation

## 2015-12-12 DIAGNOSIS — E785 Hyperlipidemia, unspecified: Secondary | ICD-10-CM | POA: Diagnosis not present

## 2015-12-12 DIAGNOSIS — M810 Age-related osteoporosis without current pathological fracture: Secondary | ICD-10-CM

## 2015-12-12 DIAGNOSIS — E1142 Type 2 diabetes mellitus with diabetic polyneuropathy: Secondary | ICD-10-CM

## 2015-12-12 DIAGNOSIS — Z6828 Body mass index (BMI) 28.0-28.9, adult: Secondary | ICD-10-CM

## 2015-12-12 DIAGNOSIS — K219 Gastro-esophageal reflux disease without esophagitis: Secondary | ICD-10-CM | POA: Diagnosis not present

## 2015-12-12 DIAGNOSIS — N3000 Acute cystitis without hematuria: Secondary | ICD-10-CM | POA: Diagnosis not present

## 2015-12-12 DIAGNOSIS — C541 Malignant neoplasm of endometrium: Secondary | ICD-10-CM

## 2015-12-12 DIAGNOSIS — I10 Essential (primary) hypertension: Secondary | ICD-10-CM | POA: Diagnosis not present

## 2015-12-12 LAB — POCT URINALYSIS DIPSTICK
Bilirubin, UA: NEGATIVE
Glucose, UA: NEGATIVE
Ketones, UA: NEGATIVE
Nitrite, UA: NEGATIVE
SPEC GRAV UA: 1.015
UROBILINOGEN UA: NEGATIVE
pH, UA: 5

## 2015-12-12 LAB — POCT UA - MICROSCOPIC ONLY
CRYSTALS, UR, HPF, POC: NEGATIVE
Casts, Ur, LPF, POC: NEGATIVE
Mucus, UA: NEGATIVE
Yeast, UA: NEGATIVE

## 2015-12-12 LAB — POCT GLYCOSYLATED HEMOGLOBIN (HGB A1C): HEMOGLOBIN A1C: 6.7

## 2015-12-12 MED ORDER — GABAPENTIN 100 MG PO CAPS: 100.0000 mg | ORAL_CAPSULE | Freq: Every day | ORAL | Status: DC

## 2015-12-12 MED ORDER — GLIPIZIDE ER 10 MG PO TB24: 10.0000 mg | ORAL_TABLET | Freq: Every day | ORAL | Status: DC

## 2015-12-12 MED ORDER — CIPROFLOXACIN HCL 500 MG PO TABS: 500.0000 mg | ORAL_TABLET | Freq: Two times a day (BID) | ORAL | Status: DC

## 2015-12-12 MED ORDER — DEXLANSOPRAZOLE 60 MG PO CPDR: 1.0000 | DELAYED_RELEASE_CAPSULE | Freq: Every day | ORAL | Status: DC

## 2015-12-12 MED ORDER — LORAZEPAM 0.5 MG PO TABS: 0.5000 mg | ORAL_TABLET | Freq: Two times a day (BID) | ORAL | Status: DC | PRN

## 2015-12-12 MED ORDER — LINAGLIPTIN 5 MG PO TABS
5.0000 mg | ORAL_TABLET | Freq: Every day | ORAL | Status: DC
Start: 2015-12-12 — End: 2016-04-30

## 2015-12-12 NOTE — Progress Notes (Signed)
Subjective:    Patient ID: Catherine Cooper, female    DOB: 1941-08-01, 75 y.o.   MRN: [id number]  Patient here today for follow up of chronic medical problems.  Outpatient Encounter Prescriptions as of 12/12/2015  Medication Sig  . ADVOCATE LANCETS MISC 1 each by Other route daily.  . ALPHAGAN P 0.1 % SOLN Place 1 drop into both eyes 2 (two) times daily.   Marland Kitchen aspirin 81 MG tablet Take 81 mg by mouth every evening.   . Blood Glucose Calibration (TAI DOC CONTROL) NORMAL SOLN 1 each by Other route daily.  . calcium citrate (CALCITRATE - DOSED IN MG ELEMENTAL CALCIUM) 950 MG tablet Take 200 mg of elemental calcium by mouth 2 (two) times daily.   . Cholecalciferol (VITAMIN D3) 2000 UNITS TABS Take 1 capsule by mouth daily.   . cholestyramine (QUESTRAN) 4 G packet   . dexlansoprazole (DEXILANT) 60 MG capsule Take 1 capsule (60 mg total) by mouth daily.  . ferrous gluconate (FERGON) 325 MG tablet Take 325 mg by mouth at bedtime.  . gabapentin (NEURONTIN) 100 MG capsule Take 1 capsule (100 mg total) by mouth daily.  Marland Kitchen glipiZIDE (GLIPIZIDE XL) 10 MG 24 hr tablet Take 1 tablet (10 mg total) by mouth daily with breakfast.  . glucose blood (CLEVER CHEK AUTO-CODE VOICE) test strip Once daily in am  . ibuprofen (ADVIL,MOTRIN) 800 MG tablet Take 1 tablet (800 mg total) by mouth every 6 (six) hours as needed for pain.  Marland Kitchen ketotifen (ALAWAY) 0.025 % ophthalmic solution Place 1 drop into both eyes 2 (two) times daily as needed (itching).   Marland Kitchen L-Methylfolate-B6-B12 (METANX) 3-35-2 MG TABS Take 1 tablet by mouth daily.   Elmore Guise Devices (ADVOCATE LANCING DEVICE) MISC 1 each by Other route daily.  Marland Kitchen latanoprost (XALATAN) 0.005 % ophthalmic solution   . linagliptin (TRADJENTA) 5 MG TABS tablet Take 1 tablet (5 mg total) by mouth daily.  Marland Kitchen LORazepam (ATIVAN) 0.5 MG tablet TAKE ONE TABLET BY MOUTH TWICE DAILY AS NEEDED  . Multiple Vitamin (MULTI-VITAMIN DAILY PO) Take 1 tablet by mouth daily. Century Mature for  adults 17+  . vitamin B-12 (CYANOCOBALAMIN) 1000 MCG tablet Take 1,000 mcg by mouth daily.     No facility-administered encounter medications on file as of 12/12/2015.   * C/O urinary frequency that started on Sunday- says that she will go to bathroom room and as soon as she is done she feels like she needs to go again.  Hyperlipidemia This is a chronic problem. The current episode started more than 1 year ago. The problem is uncontrolled. Recent lipid tests were reviewed and are variable. Associated symptoms include myalgias. Pertinent negatives include no chest pain or shortness of breath. Current antihyperlipidemic treatment includes diet change. The current treatment provides mild improvement of lipids. Compliance problems include adherence to diet and adherence to exercise.  Risk factors for coronary artery disease include diabetes mellitus, dyslipidemia, hypertension and post-menopausal.  Hypertension This is a chronic problem. The current episode started more than 1 year ago. The problem is unchanged. The problem is controlled. Pertinent negatives include no chest pain, headaches, palpitations or shortness of breath. Risk factors for coronary artery disease include dyslipidemia, diabetes mellitus, post-menopausal state and sedentary lifestyle. Past treatments include nothing. The current treatment provides moderate improvement. Compliance problems include diet and exercise.   Diabetes She presents for her follow-up diabetic visit. She has type 2 diabetes mellitus. No MedicAlert identification noted. Her disease course has been stable.  Pertinent negatives for hypoglycemia include no headaches. Pertinent negatives for diabetes include no chest pain, no polydipsia, no polyphagia, no polyuria and no weakness. There are no hypoglycemic complications. There are no diabetic complications. Risk factors for coronary artery disease include dyslipidemia, hypertension and post-menopausal. Current diabetic  treatment includes oral agent (dual therapy). She is compliant with treatment most of the time. Her weight is stable. When asked about meal planning, she reported none. She has not had a previous visit with a dietitian. She rarely participates in exercise. Her breakfast blood glucose is taken between 8-9 am. Her breakfast blood glucose range is generally 110-130 mg/dl. Her overall blood glucose range is 110-130 mg/dl. An ACE inhibitor/angiotensin II receptor blocker is not being taken. She does not see a podiatrist.Eye exam is not current.  osteporosis Fosamax weekly without side effects- Very little weight bearing exercises. Gad Only takes alprazolam once in a while not daily.  gerd dexilant working well- can tell a big difference if she does not take CKD III Currently just watching labs    Review of Systems  Constitutional: Negative.   HENT: Negative.   Respiratory: Negative for shortness of breath.   Cardiovascular: Negative for chest pain and palpitations.  Endocrine: Negative for polydipsia, polyphagia and polyuria.  Musculoskeletal: Positive for myalgias.  Neurological: Negative for weakness and headaches.  Psychiatric/Behavioral: Negative.   All other systems reviewed and are negative.      Objective:   Physical Exam  Constitutional: She is oriented to person, place, and time. She appears well-developed and well-nourished.  HENT:  Nose: Nose normal.  Mouth/Throat: Oropharynx is clear and moist.  Right cerumen impaction   Eyes: EOM are normal.  Neck: Trachea normal, normal range of motion and full passive range of motion without pain. Neck supple. No JVD present. Carotid bruit is not present. No thyromegaly present.  Cardiovascular: Normal rate, regular rhythm, normal heart sounds and intact distal pulses.  Exam reveals no gallop and no friction rub.   No murmur heard. Pulmonary/Chest: Effort normal and breath sounds normal.  Abdominal: Soft. Bowel sounds are normal. She  exhibits no distension and no mass. There is no tenderness.  Musculoskeletal: Normal range of motion.  Lymphadenopathy:    She has no cervical adenopathy.  Neurological: She is alert and oriented to person, place, and time. She has normal reflexes.  Skin: Skin is warm and dry.  Psychiatric: She has a normal mood and affect. Her behavior is normal. Judgment and thought content normal.     BP 130/70 mmHg  Pulse 182  Temp(Src) 97.6 F (36.4 C) (Oral)  Ht 5' 5"  (1.651 m)  Wt 171 lb 6.4 oz (77.747 kg)  BMI 28.52 kg/m2  Results for orders placed or performed in visit on 12/12/15  POCT glycosylated hemoglobin (Hb A1C)  Result Value Ref Range   Hemoglobin A1C 6.7   POCT urinalysis dipstick  Result Value Ref Range   Color, UA gold    Clarity, UA clear    Glucose, UA neg    Bilirubin, UA neg    Ketones, UA neg    Spec Grav, UA 1.015    Blood, UA large    pH, UA 5.0    Protein, UA trace    Urobilinogen, UA negative    Nitrite, UA neg    Leukocytes, UA large (3+) (A) Negative          Assessment & Plan:  1. Hyperlipidemia Low fat diet - Lipid panel  2. Essential hypertension  Do not add salt to diet - CMP14+EGFR  3. Type 2 diabetes mellitus with diabetic polyneuropathy, without long-term current use of insulin (HCC) Continue to watch carbs - POCT glycosylated hemoglobin (Hb A1C) - glipiZIDE (GLIPIZIDE XL) 10 MG 24 hr tablet; Take 1 tablet (10 mg total) by mouth daily with breakfast.  Dispense: 90 tablet; Refill: 1 - linagliptin (TRADJENTA) 5 MG TABS tablet; Take 1 tablet (5 mg total) by mouth at bedtime.  Dispense: 90 tablet; Refill: 1 - gabapentin (NEURONTIN) 100 MG capsule; Take 1 capsule (100 mg total) by mouth daily.  Dispense: 30 capsule; Refill: 5  4. Chronic kidney disease, stage III (moderate) Will monitr labs  5. Osteoporosis Weight bearing exercises - DG Bone Density; Future  6. Endometrial cancer ([place]) Keep follow up with GYN  7. GAD (generalized  anxiety disorder) Stress management  8. BMI 28.0-28.9,adult Discussed diet and exercise for person with BMI >25 Will recheck weight in 3-6 months  9. Gastroesophageal reflux disease without esophagitis Avoid spicy foods Do not eat 2 hours prior to bedtime - dexlansoprazole (DEXILANT) 60 MG capsule; Take 1 capsule (60 mg total) by mouth daily.  Dispense: 30 capsule; Refill: 4  10. Urinary frequency - POCT UA - Microscopic Only - POCT urinalysis dipstick  11. Acute cystitis without hematuria Take medication as prescribe Cotton underwear Take shower not bath Cranberry juice, yogurt Force fluids AZO over the counter X2 days Culture pending RTO prn - ciprofloxacin (CIPRO) 500 MG tablet; Take 1 tablet (500 mg total) by mouth 2 (two) times daily.  Dispense: 10 tablet; Refill: 0 - Urine culture    Labs pending Health maintenance reviewed Diet and exercise encouraged Continue all meds Follow up  In 3 months   [place]in [place], FNP

## 2015-12-12 NOTE — Patient Instructions (Signed)
Health Maintenance, Female Adopting a healthy lifestyle and getting preventive care can go a long way to promote health and wellness. Talk with your health care provider about what schedule of regular examinations is right for you. This is a good chance for you to check in with your provider about disease prevention and staying healthy. In between checkups, there are plenty of things you can do on your own. Experts have done a lot of research about which lifestyle changes and preventive measures are most likely to keep you healthy. Ask your health care provider for more information. WEIGHT AND DIET  Eat a healthy diet  Be sure to include plenty of vegetables, fruits, low-fat dairy products, and lean protein.  Do not eat a lot of foods high in solid fats, added sugars, or salt.  Get regular exercise. This is one of the most important things you can do for your health.  Most adults should exercise for at least 150 minutes each week. The exercise should increase your heart rate and make you sweat (moderate-intensity exercise).  Most adults should also do strengthening exercises at least twice a week. This is in addition to the moderate-intensity exercise.  Maintain a healthy weight  Body mass index (BMI) is a measurement that can be used to identify possible weight problems. It estimates body fat based on height and weight. Your health care provider can help determine your BMI and help you achieve or maintain a healthy weight.  For females 20 years of age and older:   A BMI below 18.5 is considered underweight.  A BMI of 18.5 to 24.9 is normal.  A BMI of 25 to 29.9 is considered overweight.  A BMI of 30 and above is considered obese.  Watch levels of cholesterol and blood lipids  You should start having your blood tested for lipids and cholesterol at 76 years of age, then have this test every 5 years.  You may need to have your cholesterol levels checked more often if:  Your lipid  or cholesterol levels are high.  You are older than 76 years of age.  You are at high risk for heart disease.  CANCER SCREENING   Lung Cancer  Lung cancer screening is recommended for adults 55-80 years old who are at high risk for lung cancer because of a history of smoking.  A yearly low-dose CT scan of the lungs is recommended for people who:  Currently smoke.  Have quit within the past 15 years.  Have at least a 30-pack-year history of smoking. A pack year is smoking an average of one pack of cigarettes a day for 1 year.  Yearly screening should continue until it has been 15 years since you quit.  Yearly screening should stop if you develop a health problem that would prevent you from having lung cancer treatment.  Breast Cancer  Practice breast self-awareness. This means understanding how your breasts normally appear and feel.  It also means doing regular breast self-exams. Let your health care provider know about any changes, no matter how small.  If you are in your 20s or 30s, you should have a clinical breast exam (CBE) by a health care provider every 1-3 years as part of a regular health exam.  If you are 40 or older, have a CBE every year. Also consider having a breast X-ray (mammogram) every year.  If you have a family history of breast cancer, talk to your health care provider about genetic screening.  If you   are at high risk for breast cancer, talk to your health care provider about having an MRI and a mammogram every year.  Breast cancer gene (BRCA) assessment is recommended for women who have family members with BRCA-related cancers. BRCA-related cancers include:  Breast.  Ovarian.  Tubal.  Peritoneal cancers.  Results of the assessment will determine the need for genetic counseling and BRCA1 and BRCA2 testing. Cervical Cancer Your health care provider may recommend that you be screened regularly for cancer of the pelvic organs (ovaries, uterus, and  vagina). This screening involves a pelvic examination, including checking for microscopic changes to the surface of your cervix (Pap test). You may be encouraged to have this screening done every 3 years, beginning at age 21.  For women ages 30-65, health care providers may recommend pelvic exams and Pap testing every 3 years, or they may recommend the Pap and pelvic exam, combined with testing for human papilloma virus (HPV), every 5 years. Some types of HPV increase your risk of cervical cancer. Testing for HPV may also be done on women of any age with unclear Pap test results.  Other health care providers may not recommend any screening for nonpregnant women who are considered low risk for pelvic cancer and who do not have symptoms. Ask your health care provider if a screening pelvic exam is right for you.  If you have had past treatment for cervical cancer or a condition that could lead to cancer, you need Pap tests and screening for cancer for at least 20 years after your treatment. If Pap tests have been discontinued, your risk factors (such as having a new sexual partner) need to be reassessed to determine if screening should resume. Some women have medical problems that increase the chance of getting cervical cancer. In these cases, your health care provider may recommend more frequent screening and Pap tests. Colorectal Cancer  This type of cancer can be detected and often prevented.  Routine colorectal cancer screening usually begins at 76 years of age and continues through 75 years of age.  Your health care provider may recommend screening at an earlier age if you have risk factors for colon cancer.  Your health care provider may also recommend using home test kits to check for hidden blood in the stool.  A small camera at the end of a tube can be used to examine your colon directly (sigmoidoscopy or colonoscopy). This is done to check for the earliest forms of colorectal  cancer.  Routine screening usually begins at age 50.  Direct examination of the colon should be repeated every 5-10 years through 75 years of age. However, you may need to be screened more often if early forms of precancerous polyps or small growths are found. Skin Cancer  Check your skin from head to toe regularly.  Tell your health care provider about any new moles or changes in moles, especially if there is a change in a mole's shape or color.  Also tell your health care provider if you have a mole that is larger than the size of a pencil eraser.  Always use sunscreen. Apply sunscreen liberally and repeatedly throughout the day.  Protect yourself by wearing long sleeves, pants, a wide-brimmed hat, and sunglasses whenever you are outside. HEART DISEASE, DIABETES, AND HIGH BLOOD PRESSURE   High blood pressure causes heart disease and increases the risk of stroke. High blood pressure is more likely to develop in:  People who have blood pressure in the high end   of the normal range (130-139/85-89 mm Hg).  People who are overweight or obese.  People who are African American.  If you are 38-23 years of age, have your blood pressure checked every 3-5 years. If you are 61 years of age or older, have your blood pressure checked every year. You should have your blood pressure measured twice--once when you are at a hospital or clinic, and once when you are not at a hospital or clinic. Record the average of the two measurements. To check your blood pressure when you are not at a hospital or clinic, you can use:  An automated blood pressure machine at a pharmacy.  A home blood pressure monitor.  If you are between 45 years and 39 years old, ask your health care provider if you should take aspirin to prevent strokes.  Have regular diabetes screenings. This involves taking a blood sample to check your fasting blood sugar level.  If you are at a normal weight and have a low risk for diabetes,  have this test once every three years after 76 years of age.  If you are overweight and have a high risk for diabetes, consider being tested at a younger age or more often. PREVENTING INFECTION  Hepatitis B  If you have a higher risk for hepatitis B, you should be screened for this virus. You are considered at high risk for hepatitis B if:  You were born in a country where hepatitis B is common. Ask your health care provider which countries are considered high risk.  Your parents were born in a high-risk country, and you have not been immunized against hepatitis B (hepatitis B vaccine).  You have HIV or AIDS.  You use needles to inject street drugs.  You live with someone who has hepatitis B.  You have had sex with someone who has hepatitis B.  You get hemodialysis treatment.  You take certain medicines for conditions, including cancer, organ transplantation, and autoimmune conditions. Hepatitis C  Blood testing is recommended for:  Everyone born from [birth year] through 1966.  Anyone with known risk factors for hepatitis C. Sexually transmitted infections (STIs)  You should be screened for sexually transmitted infections (STIs) including gonorrhea and chlamydia if:  You are sexually active and are younger than 76 years of age.  You are older than 76 years of age and your health care provider tells you that you are at risk for this type of infection.  Your sexual activity has changed since you were last screened and you are at an increased risk for chlamydia or gonorrhea. Ask your health care provider if you are at risk.  If you do not have HIV, but are at risk, it may be recommended that you take a prescription medicine daily to prevent HIV infection. This is called pre-exposure prophylaxis (PrEP). You are considered at risk if:  You are sexually active and do not regularly use condoms or know the HIV status of your partner(s).  You take drugs by injection.  You are sexually  active with a partner who has HIV. Talk with your health care provider about whether you are at high risk of being infected with HIV. If you choose to begin PrEP, you should first be tested for HIV. You should then be tested every 3 months for as long as you are taking PrEP.  PREGNANCY   If you are premenopausal and you may become pregnant, ask your health care provider about preconception counseling.  If you may  become pregnant, take 400 to 800 micrograms (mcg) of folic acid every day.  If you want to prevent pregnancy, talk to your health care provider about birth control (contraception). OSTEOPOROSIS AND MENOPAUSE   Osteoporosis is a disease in which the bones lose minerals and strength with aging. This can result in serious bone fractures. Your risk for osteoporosis can be identified using a bone density scan.  If you are 61 years of age or older, or if you are at risk for osteoporosis and fractures, ask your health care provider if you should be screened.  Ask your health care provider whether you should take a calcium or vitamin D supplement to lower your risk for osteoporosis.  Menopause may have certain physical symptoms and risks.  Hormone replacement therapy may reduce some of these symptoms and risks. Talk to your health care provider about whether hormone replacement therapy is right for you.  HOME CARE INSTRUCTIONS   Schedule regular health, dental, and eye exams.  Stay current with your immunizations.   Do not use any tobacco products including cigarettes, chewing tobacco, or electronic cigarettes.  If you are pregnant, do not drink alcohol.  If you are breastfeeding, limit how much and how often you drink alcohol.  Limit alcohol intake to no more than 1 drink per day for nonpregnant women. One drink equals 12 ounces of beer, 5 ounces of wine, or 1 ounces of hard liquor.  Do not use street drugs.  Do not share needles.  Ask your health care provider for help if  you need support or information about quitting drugs.  Tell your health care provider if you often feel depressed.  Tell your health care provider if you have ever been abused or do not feel safe at home.   This information is not intended to replace advice given to you by your health care provider. Make sure you discuss any questions you have with your health care provider.   Document Released: 04/14/2011 Document Revised: 10/20/2014 Document Reviewed: 08/31/2013 Elsevier Interactive Patient Education [employer].

## 2015-12-13 LAB — CMP14+EGFR
ALBUMIN: 4 g/dL (ref 3.5–4.8)
ALK PHOS: 92 IU/L (ref 39–117)
ALT: 21 IU/L (ref 0–32)
AST: 14 IU/L (ref 0–40)
Albumin/Globulin Ratio: 1.5 (ref 1.1–2.5)
BILIRUBIN TOTAL: 0.3 mg/dL (ref 0.0–1.2)
BUN / CREAT RATIO: 20 (ref 11–26)
BUN: 18 mg/dL (ref 8–27)
CHLORIDE: 100 mmol/L (ref 96–106)
CO2: 24 mmol/L (ref 18–29)
Calcium: 9 mg/dL (ref 8.7–10.3)
Creatinine, Ser: 0.92 mg/dL (ref 0.57–1.00)
GFR calc Af Amer: 70 mL/min/{1.73_m2} (ref >59)
GFR calc non Af Amer: 61 mL/min/{1.73_m2} (ref >59)
GLOBULIN, TOTAL: 2.6 g/dL (ref 1.5–4.5)
Glucose: 185 mg/dL — ABNORMAL HIGH (ref 65–99)
Potassium: 4.3 mmol/L (ref 3.5–5.2)
SODIUM: 140 mmol/L (ref 134–144)
Total Protein: 6.6 g/dL (ref 6.0–8.5)

## 2015-12-13 LAB — LIPID PANEL
CHOLESTEROL TOTAL: 150 mg/dL (ref 100–199)
Chol/HDL Ratio: 4.1 ratio units (ref 0.0–4.4)
HDL: 37 mg/dL — ABNORMAL LOW (ref >39)
LDL CALC: 76 mg/dL (ref 0–99)
Triglycerides: 184 mg/dL — ABNORMAL HIGH (ref 0–149)
VLDL Cholesterol Cal: 37 mg/dL (ref 5–40)

## 2015-12-14 LAB — URINE CULTURE

## 2015-12-17 ENCOUNTER — Other Ambulatory Visit: Payer: Self-pay | Admitting: Nurse Practitioner

## 2015-12-17 MED ORDER — SULFAMETHOXAZOLE-TRIMETHOPRIM 800-160 MG PO TABS: 1.0000 | ORAL_TABLET | Freq: Two times a day (BID) | ORAL | Status: DC

## 2015-12-21 ENCOUNTER — Telehealth: Payer: Self-pay | Admitting: Nurse Practitioner

## 2015-12-21 MED ORDER — FLUCONAZOLE 150 MG PO TABS: 150.0000 mg | ORAL_TABLET | Freq: Once | ORAL | Status: DC

## 2015-12-21 MED ORDER — NYSTATIN 100000 UNIT/ML MT SUSP: 5.0000 mL | Freq: Four times a day (QID) | OROMUCOSAL | Status: DC

## 2015-12-21 NOTE — Telephone Encounter (Signed)
Patient aware that nystatin has been sent to pharmacy. Patient also wants to know if we can send her in a diflucan for a vaginal yeast infection also

## 2015-12-21 NOTE — Telephone Encounter (Signed)
Nystatin suspension rx sent to pharmacy- swish and swallow QID

## 2016-01-17 DIAGNOSIS — L84 Corns and callosities: Secondary | ICD-10-CM | POA: Diagnosis not present

## 2016-01-17 DIAGNOSIS — E1142 Type 2 diabetes mellitus with diabetic polyneuropathy: Secondary | ICD-10-CM | POA: Diagnosis not present

## 2016-01-17 DIAGNOSIS — B351 Tinea unguium: Secondary | ICD-10-CM | POA: Diagnosis not present

## 2016-03-18 DIAGNOSIS — M79672 Pain in left foot: Secondary | ICD-10-CM | POA: Diagnosis not present

## 2016-03-18 DIAGNOSIS — M7662 Achilles tendinitis, left leg: Secondary | ICD-10-CM | POA: Diagnosis not present

## 2016-03-21 DIAGNOSIS — E1142 Type 2 diabetes mellitus with diabetic polyneuropathy: Secondary | ICD-10-CM | POA: Diagnosis not present

## 2016-03-21 DIAGNOSIS — M66362 Spontaneous rupture of flexor tendons, left lower leg: Secondary | ICD-10-CM | POA: Diagnosis not present

## 2016-03-24 ENCOUNTER — Other Ambulatory Visit (HOSPITAL_COMMUNITY): Payer: Self-pay | Admitting: Family

## 2016-03-27 ENCOUNTER — Encounter (HOSPITAL_COMMUNITY): Payer: Self-pay | Admitting: *Deleted

## 2016-03-27 DIAGNOSIS — E1142 Type 2 diabetes mellitus with diabetic polyneuropathy: Secondary | ICD-10-CM | POA: Diagnosis not present

## 2016-03-27 DIAGNOSIS — H353131 Nonexudative age-related macular degeneration, bilateral, early dry stage: Secondary | ICD-10-CM | POA: Diagnosis not present

## 2016-03-27 DIAGNOSIS — L84 Corns and callosities: Secondary | ICD-10-CM | POA: Diagnosis not present

## 2016-03-27 DIAGNOSIS — E113511 Type 2 diabetes mellitus with proliferative diabetic retinopathy with macular edema, right eye: Secondary | ICD-10-CM | POA: Diagnosis not present

## 2016-03-27 DIAGNOSIS — H35363 Drusen (degenerative) of macula, bilateral: Secondary | ICD-10-CM | POA: Diagnosis not present

## 2016-03-27 DIAGNOSIS — B351 Tinea unguium: Secondary | ICD-10-CM | POA: Diagnosis not present

## 2016-03-27 DIAGNOSIS — E113412 Type 2 diabetes mellitus with severe nonproliferative diabetic retinopathy with macular edema, left eye: Secondary | ICD-10-CM | POA: Diagnosis not present

## 2016-03-27 LAB — HM DIABETES EYE EXAM

## 2016-03-27 MED ORDER — CLINDAMYCIN PHOSPHATE 900 MG/50ML IV SOLN
900.0000 mg | INTRAVENOUS | Status: AC
  Administered 2016-03-28: 900 mg via INTRAVENOUS
  Filled 2016-03-27: qty 50

## 2016-03-27 NOTE — Progress Notes (Signed)
Pt denies SOB, chest pain, and being under the care of a cardiologist. Pt denies having an echo and cardiac cath. Pt denies having an EKG and chest x ray within the last year. Pt stated that her fasting blood glucose generally runs between 190-194. Pt made aware of diabetes protocol to check BS, interventions for a BS <70 and > 220 . Pt made aware to not take Glipizide on DOS. Pt made aware to stop taking Aspirin, vitamins, fish oil and herbal medications. Do not take any NSAIDs ie: Ibuprofen, Advil, Naproxen, BC and Goody Powder or any medication containing Aspirin. Pt verbalized understanding of all pre-op instructions.

## 2016-03-28 ENCOUNTER — Ambulatory Visit (HOSPITAL_COMMUNITY): Payer: Medicare Other | Admitting: Certified Registered Nurse Anesthetist

## 2016-03-28 ENCOUNTER — Encounter (HOSPITAL_COMMUNITY)
Admission: RE | Disposition: A | Discharge status: Home / Self Care | Payer: Self-pay | Source: Ambulatory Visit | Attending: Orthopedic Surgery

## 2016-03-28 ENCOUNTER — Encounter (HOSPITAL_COMMUNITY): Payer: Self-pay | Admitting: *Deleted

## 2016-03-28 ENCOUNTER — Ambulatory Visit (HOSPITAL_COMMUNITY)
Admission: RE | Admit: 2016-03-28 | Discharge: 2016-03-28 | Disposition: A | Discharge status: Home / Self Care | Payer: Medicare Other | Source: Ambulatory Visit | Attending: Orthopedic Surgery | Admitting: Orthopedic Surgery

## 2016-03-28 DIAGNOSIS — Z79899 Other long term (current) drug therapy: Secondary | ICD-10-CM | POA: Diagnosis not present

## 2016-03-28 DIAGNOSIS — M66362 Spontaneous rupture of flexor tendons, left lower leg: Secondary | ICD-10-CM | POA: Diagnosis not present

## 2016-03-28 DIAGNOSIS — F419 Anxiety disorder, unspecified: Secondary | ICD-10-CM | POA: Diagnosis not present

## 2016-03-28 DIAGNOSIS — S86012A Strain of left Achilles tendon, initial encounter: Secondary | ICD-10-CM | POA: Insufficient documentation

## 2016-03-28 DIAGNOSIS — M1990 Unspecified osteoarthritis, unspecified site: Secondary | ICD-10-CM | POA: Diagnosis not present

## 2016-03-28 DIAGNOSIS — Z7984 Long term (current) use of oral hypoglycemic drugs: Secondary | ICD-10-CM | POA: Diagnosis not present

## 2016-03-28 DIAGNOSIS — E785 Hyperlipidemia, unspecified: Secondary | ICD-10-CM | POA: Diagnosis not present

## 2016-03-28 DIAGNOSIS — E1140 Type 2 diabetes mellitus with diabetic neuropathy, unspecified: Secondary | ICD-10-CM | POA: Insufficient documentation

## 2016-03-28 DIAGNOSIS — H409 Unspecified glaucoma: Secondary | ICD-10-CM | POA: Diagnosis not present

## 2016-03-28 DIAGNOSIS — X58XXXA Exposure to other specified factors, initial encounter: Secondary | ICD-10-CM | POA: Insufficient documentation

## 2016-03-28 DIAGNOSIS — Z8589 Personal history of malignant neoplasm of other organs and systems: Secondary | ICD-10-CM | POA: Diagnosis not present

## 2016-03-28 DIAGNOSIS — Z880 Allergy status to penicillin: Secondary | ICD-10-CM | POA: Insufficient documentation

## 2016-03-28 DIAGNOSIS — K219 Gastro-esophageal reflux disease without esophagitis: Secondary | ICD-10-CM | POA: Diagnosis not present

## 2016-03-28 HISTORY — PX: ACHILLES TENDON SURGERY: SHX542

## 2016-03-28 HISTORY — DX: Type 2 diabetes mellitus with diabetic neuropathy, unspecified: E11.40

## 2016-03-28 HISTORY — DX: Strain of left Achilles tendon, initial encounter: S86.012A

## 2016-03-28 LAB — BASIC METABOLIC PANEL
Anion gap: 9 (ref 5–15)
BUN: 13 mg/dL (ref 6–20)
CALCIUM: 9.1 mg/dL (ref 8.9–10.3)
CO2: 25 mmol/L (ref 22–32)
CREATININE: 0.95 mg/dL (ref 0.44–1.00)
Chloride: 106 mmol/L (ref 101–111)
GFR calc Af Amer: >60 mL/min (ref >60)
GFR, EST NON AFRICAN AMERICAN: 57 mL/min — AB (ref >60)
Glucose, Bld: 180 mg/dL — ABNORMAL HIGH (ref 65–99)
POTASSIUM: 4 mmol/L (ref 3.5–5.1)
SODIUM: 140 mmol/L (ref 135–145)

## 2016-03-28 LAB — GLUCOSE, CAPILLARY
GLUCOSE-CAPILLARY: 184 mg/dL — AB (ref 65–99)
Glucose-Capillary: 169 mg/dL — ABNORMAL HIGH (ref 65–99)
Glucose-Capillary: 143 mg/dL — ABNORMAL HIGH (ref 65–99)

## 2016-03-28 LAB — CBC
HCT: 37.1 % (ref 36.0–46.0)
Hemoglobin: 11.8 g/dL — ABNORMAL LOW (ref 12.0–15.0)
MCH: 31 pg (ref 26.0–34.0)
MCHC: 31.8 g/dL (ref 30.0–36.0)
MCV: 97.4 fL (ref 78.0–100.0)
PLATELETS: 174 10*3/uL (ref 150–400)
RBC: 3.81 MIL/uL — AB (ref 3.87–5.11)
RDW: 12.8 % (ref 11.5–15.5)
WBC: 6.9 10*3/uL (ref 4.0–10.5)

## 2016-03-28 SURGERY — REPAIR, TENDON, ACHILLES
Anesthesia: Monitor Anesthesia Care | Laterality: Left

## 2016-03-28 MED ORDER — PROPOFOL 10 MG/ML IV BOLUS
INTRAVENOUS | Status: DC | PRN
  Administered 2016-03-28: 20 mg via INTRAVENOUS

## 2016-03-28 MED ORDER — PROPOFOL 500 MG/50ML IV EMUL
INTRAVENOUS | Status: DC | PRN
  Administered 2016-03-28: 50 ug/kg/min via INTRAVENOUS

## 2016-03-28 MED ORDER — HYDROCODONE-ACETAMINOPHEN 5-325 MG PO TABS: 1.0000 | ORAL_TABLET | Freq: Four times a day (QID) | ORAL | Status: DC | PRN

## 2016-03-28 MED ORDER — MIDAZOLAM HCL 2 MG/2ML IJ SOLN
INTRAMUSCULAR | Status: AC
  Filled 2016-03-28: qty 2

## 2016-03-28 MED ORDER — BUPIVACAINE IN DEXTROSE 0.75-8.25 % IT SOLN
INTRATHECAL | Status: DC | PRN
  Administered 2016-03-28: 1.5 mL via INTRATHECAL

## 2016-03-28 MED ORDER — MIDAZOLAM HCL 2 MG/2ML IJ SOLN
INTRAMUSCULAR | Status: DC | PRN
  Administered 2016-03-28: 2 mg via INTRAVENOUS

## 2016-03-28 MED ORDER — FENTANYL CITRATE (PF) 100 MCG/2ML IJ SOLN
25.0000 ug | INTRAMUSCULAR | Status: DC | PRN
  Administered 2016-03-28: 25 ug via INTRAVENOUS

## 2016-03-28 MED ORDER — METOCLOPRAMIDE HCL 5 MG/ML IJ SOLN: 10.0000 mg | Freq: Once | INTRAMUSCULAR | Status: DC | PRN

## 2016-03-28 MED ORDER — PROPOFOL 1000 MG/100ML IV EMUL
INTRAVENOUS | Status: AC
  Filled 2016-03-28: qty 200

## 2016-03-28 MED ORDER — FENTANYL CITRATE (PF) 100 MCG/2ML IJ SOLN
INTRAMUSCULAR | Status: AC
  Filled 2016-03-28: qty 2

## 2016-03-28 MED ORDER — LIDOCAINE 2% (20 MG/ML) 5 ML SYRINGE
INTRAMUSCULAR | Status: AC
  Filled 2016-03-28: qty 5

## 2016-03-28 MED ORDER — CHLORHEXIDINE GLUCONATE 4 % EX LIQD: 60.0000 mL | Freq: Once | CUTANEOUS | Status: DC

## 2016-03-28 MED ORDER — LACTATED RINGERS IV SOLN
INTRAVENOUS | Status: DC
  Administered 2016-03-28 (×2): via INTRAVENOUS

## 2016-03-28 MED ORDER — FENTANYL CITRATE (PF) 250 MCG/5ML IJ SOLN
INTRAMUSCULAR | Status: AC
  Filled 2016-03-28: qty 5

## 2016-03-28 MED ORDER — ONDANSETRON HCL 4 MG/2ML IJ SOLN
INTRAMUSCULAR | Status: DC | PRN
  Administered 2016-03-28: 4 mg via INTRAVENOUS

## 2016-03-28 MED ORDER — LIDOCAINE HCL (CARDIAC) 20 MG/ML IV SOLN
INTRAVENOUS | Status: DC | PRN
  Administered 2016-03-28: 80 mg via INTRAVENOUS

## 2016-03-28 MED ORDER — MEPERIDINE HCL 25 MG/ML IJ SOLN: 6.2500 mg | INTRAMUSCULAR | Status: DC | PRN

## 2016-03-28 SURGICAL SUPPLY — 37 items
BNDG CMPR 9X4 STRL LF SNTH (GAUZE/BANDAGES/DRESSINGS)
BNDG COHESIVE 6X5 TAN STRL LF (GAUZE/BANDAGES/DRESSINGS) ×4 IMPLANT
BNDG ESMARK 4X9 LF (GAUZE/BANDAGES/DRESSINGS) IMPLANT
BNDG GAUZE ELAST 4 BULKY (GAUZE/BANDAGES/DRESSINGS) ×2 IMPLANT
CANISTER SUCTION 2500CC (MISCELLANEOUS) ×3 IMPLANT
COVER SURGICAL LIGHT HANDLE (MISCELLANEOUS) ×6 IMPLANT
CUFF TOURNIQUET SINGLE 34IN LL (TOURNIQUET CUFF) IMPLANT
CUFF TOURNIQUET SINGLE 44IN (TOURNIQUET CUFF) IMPLANT
DRAPE U-SHAPE 47X51 STRL (DRAPES) ×3 IMPLANT
DRSG ADAPTIC 3X8 NADH LF (GAUZE/BANDAGES/DRESSINGS) ×3 IMPLANT
DRSG PAD ABDOMINAL 8X10 ST (GAUZE/BANDAGES/DRESSINGS) ×4 IMPLANT
DURAPREP 26ML APPLICATOR (WOUND CARE) ×3 IMPLANT
ELECT REM PT RETURN 9FT ADLT (ELECTROSURGICAL) ×3
ELECTRODE REM PT RTRN 9FT ADLT (ELECTROSURGICAL) ×1 IMPLANT
GAUZE SPONGE 4X4 12PLY STRL (GAUZE/BANDAGES/DRESSINGS) ×3 IMPLANT
GLOVE BIOGEL PI IND STRL 9 (GLOVE) ×1 IMPLANT
GLOVE BIOGEL PI INDICATOR 9 (GLOVE) ×2
GLOVE SURG ORTHO 9.0 STRL STRW (GLOVE) ×3 IMPLANT
GOWN STRL REUS W/ TWL XL LVL3 (GOWN DISPOSABLE) ×3 IMPLANT
GOWN STRL REUS W/TWL XL LVL3 (GOWN DISPOSABLE) ×9
KIT ROOM TURNOVER OR (KITS) ×3 IMPLANT
NDL SUT .5 MAYO 1.404X.05X (NEEDLE) ×1 IMPLANT
NEEDLE MAYO TAPER (NEEDLE) ×3
NS IRRIG 1000ML POUR BTL (IV SOLUTION) ×3 IMPLANT
PACK ACHILLES SUTUREBRIDGE (Anchor) ×6 IMPLANT
PACK ORTHO EXTREMITY (CUSTOM PROCEDURE TRAY) ×3 IMPLANT
PAD ARMBOARD 7.5X6 YLW CONV (MISCELLANEOUS) ×6 IMPLANT
SPONGE LAP 18X18 X RAY DECT (DISPOSABLE) ×3 IMPLANT
SUT ETHILON 2 0 PSLX (SUTURE) IMPLANT
SUT FIBERWIRE #2 38 T-5 BLUE (SUTURE)
SUTURE FIBERWR #2 38 T-5 BLUE (SUTURE) ×2 IMPLANT
TOWEL OR 17X24 6PK STRL BLUE (TOWEL DISPOSABLE) ×3 IMPLANT
TOWEL OR 17X26 10 PK STRL BLUE (TOWEL DISPOSABLE) ×3 IMPLANT
TUBE CONNECTING 12'X1/4 (SUCTIONS) ×1
TUBE CONNECTING 12X1/4 (SUCTIONS) ×2 IMPLANT
WATER STERILE IRR 1000ML POUR (IV SOLUTION) IMPLANT
YANKAUER SUCT BULB TIP NO VENT (SUCTIONS) ×3 IMPLANT

## 2016-03-28 NOTE — H&P (Signed)
Catherine Cooper is an 75 y.o. female.   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  Past Medical History  Diagnosis Date  . Diabetes mellitus     type II  . Osteoporosis   . Glaucoma   . GAD (generalized anxiety disorder)   . GERD (gastroesophageal reflux disease)   . Hyperlipidemia     not on medications  . Endometrioid adenocarcinoma   . 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 Shingles 2004  . Cataracts, bilateral   . Anxiety   . Nephrolithiasis 2000  . Iron deficiency   . Diarrhea     attributed to diabetic meds  . Neuropathy ([place])     both feet  . Diabetic neuropathy ([place])   . Achilles rupture, left     Past Surgical History  Procedure Laterality Date  .  vitrectomy  02/23/08    posterior; right eye  . Tubal ligation    . Foot surgery      Right foot / left heel  . Glaucome surgery right eye    . Skin graft Right 2007    foot  . Colonoscopy  2013  . Abdominal hysterectomy    . 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 Eye surgery      Family History  Problem Relation Age of Onset  . Dementia Father   . Diabetes Father   . Coronary artery disease Father 39  . Hip fracture Father   . Atrial fibrillation Mother   . Osteoporosis Mother    Social History:  reports that she has never smoked. She has never used smokeless tobacco. She reports that she does not drink alcohol or use illicit drugs.  Allergies:  Allergies  Allergen Reactions  . Lipitor [Atorvastatin]     fatique and myalgia  . Penicillins Rash    No prescriptions prior to admission    No results found for this or any previous visit (from the past 48 hour(s)). No results found.  Review of Systems  All  other systems reviewed and are negative.   There were no vitals taken for this visit. Physical Exam  On examination patient has complete rupture of the Achilles tendon this was verified by ultrasound. Patient has pain and stability with ambulation presents at this time for reconstruction of the Achilles tendon. Assessment/Plan Assessment: Left Achilles tendon rupture.  Plan: We'll plan for left Achilles tendon reconstruction risk and benefits were discussed including risk of the wound not healing rerupture need for additional surgery. Patient states she understands wish to proceed at this time.  Newt Minion, MD 03/28/2016, 6:42 AM

## 2016-03-28 NOTE — Anesthesia Postprocedure Evaluation (Signed)
Anesthesia Post Note  Patient: Catherine Cooper  Procedure(s) Performed: Procedure(s) (LRB): Reconstruction Left Achilles (Left)  Patient location during evaluation: PACU Anesthesia Type: Spinal Level of consciousness: oriented and awake and alert Pain management: pain level controlled Vital Signs Assessment: post-procedure vital signs reviewed and stable Respiratory status: spontaneous breathing, respiratory function stable and patient connected to nasal cannula oxygen Cardiovascular status: blood pressure returned to baseline and stable Postop Assessment: no headache, no backache and spinal receding Anesthetic complications: no    Last Vitals:  Filed Vitals:   03/28/16 1129 03/28/16 1145  BP: 122/61 126/89  Pulse: 70 70  Temp:    Resp: 11 15    Last Pain: There were no vitals filed for this visit.               Montez Hageman

## 2016-03-28 NOTE — Transfer of Care (Signed)
Immediate Anesthesia Transfer of Care Note  Patient: Catherine Cooper  Procedure(s) Performed: Procedure(s): Reconstruction Left Achilles (Left)  Patient Location: PACU  Anesthesia Type:MAC and Spinal  Level of Consciousness: awake, alert  and oriented  Airway & Oxygen Therapy: Patient Spontanous Breathing  Post-op Assessment: Report given to RN and Post -op Vital signs reviewed and stable  Post vital signs: Reviewed and stable  Last Vitals:  Filed Vitals:   03/28/16 0731  BP: 174/63  Pulse: 81  Temp: 37.1 C  Resp: 18    Last Pain: There were no vitals filed for this visit.    Patients Stated Pain Goal: 3 ([id number])  Complications: No apparent anesthesia complications

## 2016-03-28 NOTE — Op Note (Signed)
03/28/2016  10:56 AM  PATIENT:  Catherine Cooper    PRE-OPERATIVE DIAGNOSIS:  L Achilles Rupture  POST-OPERATIVE DIAGNOSIS:  Same  PROCEDURE:  Reconstruction Left Achilles  SURGEON:  Newt Minion, MD  PHYSICIAN ASSISTANT:None ANESTHESIA:   General  PREOPERATIVE INDICATIONS:  [name] is a  75 y.o. female with a diagnosis of L Achilles Rupture who failed conservative measures and elected for surgical management.    The risks benefits and alternatives were discussed with the patient preoperatively including but not limited to the risks of infection, bleeding, nerve injury, cardiopulmonary complications, the need for revision surgery, among others, and the patient was willing to proceed.  OPERATIVE IMPLANTS: Arthrex push lock 2  OPERATIVE FINDINGS: Avulsion of the Achilles off the calcaneus  OPERATIVE PROCEDURE: Patient was brought to operating room and underwent spinal anesthetic. After adequate anesthesia obtained patient's placed supine the left lower extremity was prepped using DuraPrep draped into a sterile field a timeout was called. A posterior medial incision was made along the Achilles this was carried down through the peritenon sharply. The end of the Achilles was freshened and had completely avulsed off the calcaneus. Using #2 FiberWire this was locked with a Krakw suture 2 through the proximal aspect. 2 push lock anchors were placed in the calcaneus and the Achilles was advanced and secured to the push locks. The wound was irrigated with normal saline the peritenon skin were closed using 2-0 nylon using an Allergan allergen not a suture technique. A sterile compressive dressing was applied patient was placed in a fracture boot with her heel elevated follow-up in the office in 1 week.

## 2016-03-28 NOTE — Anesthesia Preprocedure Evaluation (Addendum)
Anesthesia Evaluation  Patient identified by MRN, date of birth, ID band Patient awake    Reviewed: Allergy & Precautions, NPO status , Patient's Chart, lab work & pertinent test results  History of Anesthesia Complications Negative for: history of anesthetic complications  Airway Mallampati: I  TM Distance: >3 FB Neck ROM: Full    Dental  (+) Dental Advisory Given, Poor Dentition, Missing   Pulmonary neg pulmonary ROS,    breath sounds clear to auscultation       Cardiovascular negative cardio ROS   Rhythm:Regular Rate:Normal     Neuro/Psych Anxiety negative neurological ROS     GI/Hepatic Neg liver ROS, GERD  Medicated and Controlled,  Endo/Other  diabetes, Oral Hypoglycemic Agents  Renal/GU negative Renal ROS     Musculoskeletal   Abdominal   Peds  Hematology   Anesthesia Other Findings H/o endometrial cancer  Reproductive/Obstetrics                            Anesthesia Physical  Anesthesia Plan  ASA: II  Anesthesia Plan: Spinal and MAC   Post-op Pain Management:    Induction:   Airway Management Planned: Simple Face Mask  Additional Equipment:   Intra-op Plan:   Post-operative Plan:   Informed Consent: I have reviewed the patients History and Physical, chart, labs and discussed the procedure including the risks, benefits and alternatives for the proposed anesthesia with the patient or authorized representative who has indicated his/her understanding and acceptance.   Dental advisory given  Plan Discussed with: CRNA and Surgeon  Anesthesia Plan Comments:        Anesthesia Quick Evaluation

## 2016-03-28 NOTE — Anesthesia Procedure Notes (Addendum)
Spinal Patient location during procedure: OR Staffing Anesthesiologist: Janylah Belgrave Performed by: anesthesiologist  Preanesthetic Checklist Completed: patient identified, site marked, surgical consent, pre-op evaluation, timeout performed, IV checked, risks and benefits discussed and monitors and equipment checked Spinal Block Patient position: sitting Prep: Betadine Patient monitoring: heart rate, continuous pulse ox and blood pressure Approach: right paramedian Location: L4-5 Injection technique: single-shot Needle Needle type: Spinocan  Needle gauge: 22 G Needle length: 9 cm Additional Notes Expiration date of kit checked and confirmed. Patient tolerated procedure well, without complications.     

## 2016-03-29 LAB — HEMOGLOBIN A1C
HEMOGLOBIN A1C: 7.5 % — AB (ref 4.8–5.6)
MEAN PLASMA GLUCOSE: 169 mg/dL

## 2016-03-31 ENCOUNTER — Encounter (HOSPITAL_COMMUNITY): Payer: Self-pay | Admitting: Orthopedic Surgery

## 2016-04-11 DIAGNOSIS — E1142 Type 2 diabetes mellitus with diabetic polyneuropathy: Secondary | ICD-10-CM | POA: Diagnosis not present

## 2016-04-11 DIAGNOSIS — M66362 Spontaneous rupture of flexor tendons, left lower leg: Secondary | ICD-10-CM | POA: Diagnosis not present

## 2016-04-23 DIAGNOSIS — H353112 Nonexudative age-related macular degeneration, right eye, intermediate dry stage: Secondary | ICD-10-CM | POA: Diagnosis not present

## 2016-04-23 DIAGNOSIS — H401213 Low-tension glaucoma, right eye, severe stage: Secondary | ICD-10-CM | POA: Diagnosis not present

## 2016-04-23 DIAGNOSIS — H353122 Nonexudative age-related macular degeneration, left eye, intermediate dry stage: Secondary | ICD-10-CM | POA: Diagnosis not present

## 2016-04-23 DIAGNOSIS — H401221 Low-tension glaucoma, left eye, mild stage: Secondary | ICD-10-CM | POA: Diagnosis not present

## 2016-04-30 ENCOUNTER — Encounter: Payer: Self-pay | Admitting: Nurse Practitioner

## 2016-04-30 ENCOUNTER — Ambulatory Visit (INDEPENDENT_AMBULATORY_CARE_PROVIDER_SITE_OTHER): Payer: BC Managed Care – PPO | Admitting: Nurse Practitioner

## 2016-04-30 VITALS — BP 120/71 | HR 93 | Temp 97.9°F | Ht 65.0 in | Wt 170.0 lb

## 2016-04-30 DIAGNOSIS — Z6828 Body mass index (BMI) 28.0-28.9, adult: Secondary | ICD-10-CM | POA: Diagnosis not present

## 2016-04-30 DIAGNOSIS — F411 Generalized anxiety disorder: Secondary | ICD-10-CM

## 2016-04-30 DIAGNOSIS — M810 Age-related osteoporosis without current pathological fracture: Secondary | ICD-10-CM

## 2016-04-30 DIAGNOSIS — N183 Chronic kidney disease, stage 3 unspecified: Secondary | ICD-10-CM

## 2016-04-30 DIAGNOSIS — K219 Gastro-esophageal reflux disease without esophagitis: Secondary | ICD-10-CM

## 2016-04-30 DIAGNOSIS — E1142 Type 2 diabetes mellitus with diabetic polyneuropathy: Secondary | ICD-10-CM

## 2016-04-30 DIAGNOSIS — I10 Essential (primary) hypertension: Secondary | ICD-10-CM | POA: Diagnosis not present

## 2016-04-30 DIAGNOSIS — C541 Malignant neoplasm of endometrium: Secondary | ICD-10-CM

## 2016-04-30 MED ORDER — LINAGLIPTIN 5 MG PO TABS: 5.0000 mg | ORAL_TABLET | Freq: Every day | ORAL | Status: DC

## 2016-04-30 MED ORDER — GABAPENTIN 100 MG PO CAPS: 100.0000 mg | ORAL_CAPSULE | Freq: Every day | ORAL | Status: DC

## 2016-04-30 MED ORDER — LORAZEPAM 0.5 MG PO TABS: 0.5000 mg | ORAL_TABLET | Freq: Two times a day (BID) | ORAL | Status: DC | PRN

## 2016-04-30 MED ORDER — DEXLANSOPRAZOLE 60 MG PO CPDR: 1.0000 | DELAYED_RELEASE_CAPSULE | Freq: Every day | ORAL | Status: DC

## 2016-04-30 MED ORDER — GLIPIZIDE ER 10 MG PO TB24: 10.0000 mg | ORAL_TABLET | Freq: Every day | ORAL | Status: DC

## 2016-04-30 NOTE — Patient Instructions (Signed)
Fall Prevention in the Home  Falls can cause injuries and can affect people from all age groups. There are many simple things that you can do to make your home safe and to help prevent falls. WHAT CAN I DO ON THE OUTSIDE OF MY HOME?  Regularly repair the edges of walkways and driveways and fix any cracks.  Remove high doorway thresholds.  Trim any shrubbery on the main path into your home.  Use bright outdoor lighting.  Clear walkways of debris and clutter, including tools and rocks.  Regularly check that handrails are securely fastened and in good repair. Both sides of any steps should have handrails.  Install guardrails along the edges of any raised decks or porches.  Have leaves, snow, and ice cleared regularly.  Use sand or salt on walkways during winter months.  In the garage, clean up any spills right away, including grease or oil spills. WHAT CAN I DO IN THE BATHROOM?  Use night lights.  Install grab bars by the toilet and in the tub and shower. Do not use towel bars as grab bars.  Use non-skid mats or decals on the floor of the tub or shower.  If you need to sit down while you are in the shower, use a plastic, non-slip stool..  Keep the floor dry. Immediately clean up any water that spills on the floor.  Remove soap buildup in the tub or shower on a regular basis.  Attach bath mats securely with double-sided non-slip rug tape.  Remove throw rugs and other tripping hazards from the floor. WHAT CAN I DO IN THE BEDROOM?  Use night lights.  Make sure that a bedside light is easy to reach.  Do not use oversized bedding that drapes onto the floor.  Have a firm chair that has side arms to use for getting dressed.  Remove throw rugs and other tripping hazards from the floor. WHAT CAN I DO IN THE KITCHEN?   Clean up any spills right away.  Avoid walking on wet floors.  Place frequently used items in easy-to-reach places.  If you need to reach for something  above you, use a sturdy step stool that has a grab bar.  Keep electrical cables out of the way.  Do not use floor polish or wax that makes floors slippery. If you have to use wax, make sure that it is non-skid floor wax.  Remove throw rugs and other tripping hazards from the floor. WHAT CAN I DO IN THE STAIRWAYS?  Do not leave any items on the stairs.  Make sure that there are handrails on both sides of the stairs. Fix handrails that are broken or loose. Make sure that handrails are as long as the stairways.  Check any carpeting to make sure that it is firmly attached to the stairs. Fix any carpet that is loose or worn.  Avoid having throw rugs at the top or bottom of stairways, or secure the rugs with carpet tape to prevent them from moving.  Make sure that you have a light switch at the top of the stairs and the bottom of the stairs. If you do not have them, have them installed. WHAT ARE SOME OTHER FALL PREVENTION TIPS?  Wear closed-toe shoes that fit well and support your feet. Wear shoes that have rubber soles or low heels.  When you use a stepladder, make sure that it is completely opened and that the sides are firmly locked. Have someone hold the ladder while you   are using it. Do not climb a closed stepladder.  Add color or contrast paint or tape to grab bars and handrails in your home. Place contrasting color strips on the first and last steps.  Use mobility aids as needed, such as canes, walkers, scooters, and crutches.  Turn on lights if it is dark. Replace any light bulbs that burn out.  Set up furniture so that there are clear paths. Keep the furniture in the same spot.  Fix any uneven floor surfaces.  Choose a carpet design that does not hide the edge of steps of a stairway.  Be aware of any and all pets.  Review your medicines with your healthcare provider. Some medicines can cause dizziness or changes in blood pressure, which increase your risk of falling. Talk  with your health care provider about other ways that you can decrease your risk of falls. This may include working with a physical therapist or trainer to improve your strength, balance, and endurance.   This information is not intended to replace advice given to you by your health care provider. Make sure you discuss any questions you have with your health care provider.   Document Released: 09/19/2002 Document Revised: 02/13/2015 Document Reviewed: 11/03/2014 Elsevier Interactive Patient Education 2016 [employer] Inc.  

## 2016-04-30 NOTE — Progress Notes (Signed)
Subjective:    Patient ID: Catherine Cooper, female    DOB: 05-Mar-1941, 75 y.o.   MRN: [id number]  Patient here today for follow up of chronic medical problems.  Outpatient Encounter Prescriptions as of 04/30/2016  Medication Sig  . ALPHAGAN P 0.1 % SOLN Place 1 drop into both eyes 2 (two) times daily.   Marland Kitchen aspirin 81 MG tablet Take 81 mg by mouth every evening.   . bifidobacterium infantis (ALIGN) capsule Take 1 capsule by mouth daily.  . Calcium 600-400 MG-UNIT CHEW Chew by mouth.  . Cholecalciferol (VITAMIN D3) 2000 UNITS TABS Take 1 capsule by mouth daily.   . cholestyramine (QUESTRAN) 4 G packet Take 4 g by mouth daily.   Marland Kitchen dexlansoprazole (DEXILANT) 60 MG capsule Take 1 capsule (60 mg total) by mouth daily.  . ferrous gluconate (FERGON) 324 MG tablet Take 324 mg by mouth daily with breakfast.  . gabapentin (NEURONTIN) 100 MG capsule Take 1 capsule (100 mg total) by mouth daily.  Marland Kitchen glipiZIDE (GLIPIZIDE XL) 10 MG 24 hr tablet Take 1 tablet (10 mg total) by mouth daily with breakfast.  . ibuprofen (ADVIL,MOTRIN) 800 MG tablet Take 1 tablet (800 mg total) by mouth every 6 (six) hours as needed for pain.  Marland Kitchen ketotifen (ZADITOR) 0.025 % ophthalmic solution 1 drop 2 (two) times daily.  Marland Kitchen latanoprost (XALATAN) 0.005 % ophthalmic solution Place 1 drop into both eyes at bedtime.   Marland Kitchen linagliptin (TRADJENTA) 5 MG TABS tablet Take 1 tablet (5 mg total) by mouth at bedtime.  Marland Kitchen LORazepam (ATIVAN) 0.5 MG tablet Take 1 tablet (0.5 mg total) by mouth 2 (two) times daily as needed. (Patient taking differently: Take 0.5 mg by mouth 2 (two) times daily as needed for anxiety or sleep. )  . Multiple Vitamin (MULTI-VITAMIN DAILY PO) Take 1 tablet by mouth daily. Century Mature for adults 43+  . vitamin B-12 (CYANOCOBALAMIN) 1000 MCG tablet Take 1,000 mcg by mouth daily.    . [DISCONTINUED] calcium citrate (CALCITRATE - DOSED IN MG ELEMENTAL CALCIUM) 950 MG tablet Take 200 mg of elemental calcium by mouth 2 (two)  times daily.   . [DISCONTINUED] HYDROcodone-acetaminophen (NORCO) 5-325 MG tablet Take 1 tablet by mouth every 6 (six) hours as needed.   No facility-administered encounter medications on file as of 04/30/2016.   * Patient ruptured her achilles tendon since last vst and had to have surgery to reattach. SHe is walking with cam boot- but is doing well.  Hyperlipidemia This is a chronic problem. The current episode started more than 1 year ago. The problem is uncontrolled. Recent lipid tests were reviewed and are variable. Associated symptoms include myalgias. Pertinent negatives include no chest pain or shortness of breath. Current antihyperlipidemic treatment includes diet change. The current treatment provides mild improvement of lipids. Compliance problems include adherence to diet and adherence to exercise.  Risk factors for coronary artery disease include diabetes mellitus, dyslipidemia, hypertension and post-menopausal.  Hypertension This is a chronic problem. The current episode started more than 1 year ago. The problem is unchanged. The problem is controlled. Pertinent negatives include no chest pain, headaches, palpitations or shortness of breath. Risk factors for coronary artery disease include dyslipidemia, diabetes mellitus, post-menopausal state and sedentary lifestyle. Past treatments include nothing. The current treatment provides moderate improvement. Compliance problems include diet and exercise.   Diabetes She presents for her follow-up diabetic visit. She has type 2 diabetes mellitus. No MedicAlert identification noted. Her disease course has been stable. Pertinent  negatives for hypoglycemia include no headaches. Pertinent negatives for diabetes include no chest pain, no polydipsia, no polyphagia, no polyuria and no weakness. There are no hypoglycemic complications. There are no diabetic complications. Risk factors for coronary artery disease include dyslipidemia, hypertension and  post-menopausal. Current diabetic treatment includes oral agent (dual therapy). She is compliant with treatment most of the time. Her weight is stable. When asked about meal planning, she reported none. She has not had a previous visit with a dietitian. She rarely participates in exercise. Her breakfast blood glucose is taken between 8-9 am. Her breakfast blood glucose range is generally 110-130 mg/dl. Her overall blood glucose range is 110-130 mg/dl. An ACE inhibitor/angiotensin II receptor blocker is not being taken. She does not see a podiatrist.Eye exam is not current.  osteporosis Fosamax weekly without side effects- Very little weight bearing exercises. Gad Only takes alprazolam once in a while not daily.  gerd dexilant working well- can tell a big difference if she does not take CKD III Currently just watching labs Hx endometrial cancer No longer sees oncologist- had hysterectomey and did not need chemo or radiation.  Review of Systems  Constitutional: Negative.   HENT: Negative.   Respiratory: Negative for shortness of breath.   Cardiovascular: Negative for chest pain and palpitations.  Endocrine: Negative for polydipsia, polyphagia and polyuria.  Musculoskeletal: Positive for myalgias.  Neurological: Negative for weakness and headaches.  Psychiatric/Behavioral: Negative.   All other systems reviewed and are negative.      Objective:   Physical Exam  Constitutional: She is oriented to person, place, and time. She appears well-developed and well-nourished.  HENT:  Nose: Nose normal.  Mouth/Throat: Oropharynx is clear and moist.  Right cerumen impaction   Eyes: EOM are normal.  Neck: Trachea normal, normal range of motion and full passive range of motion without pain. Neck supple. No JVD present. Carotid bruit is not present. No thyromegaly present.  Cardiovascular: Normal rate, regular rhythm, normal heart sounds and intact distal pulses.  Exam reveals no gallop and no  friction rub.   No murmur heard. Pulmonary/Chest: Effort normal and breath sounds normal.  Abdominal: Soft. Bowel sounds are normal. She exhibits no distension and no mass. There is no tenderness.  Musculoskeletal: Normal range of motion.  Lymphadenopathy:    She has no cervical adenopathy.  Neurological: She is alert and oriented to person, place, and time. She has normal reflexes.  Skin: Skin is warm and dry.  Psychiatric: She has a normal mood and affect. Her behavior is normal. Judgment and thought content normal.     BP 120/71 mmHg  Pulse 93  Temp(Src) 97.9 F (36.6 C) (Oral)  Ht 5\' 5"  (1.651 m)  Wt 170 lb (77.111 kg)  BMI 28.29 kg/m2       Assessment & Plan:  1. Essential hypertension Do not add salt to diet  2. Gastroesophageal reflux disease without esophagitis Avoid spicy foods Do not eat 2 hours prior to bedtime - dexlansoprazole (DEXILANT) 60 MG capsule; Take 1 capsule (60 mg total) by mouth daily.  Dispense: 30 capsule; Refill: 4  3. Type 2 diabetes mellitus with diabetic polyneuropathy, without long-term current use of insulin (HCC) Stricter carb counting - glipiZIDE (GLIPIZIDE XL) 10 MG 24 hr tablet; Take 1 tablet (10 mg total) by mouth daily with breakfast.  Dispense: 90 tablet; Refill: 1 - gabapentin (NEURONTIN) 100 MG capsule; Take 1 capsule (100 mg total) by mouth daily.  Dispense: 30 capsule; Refill: 5 - linagliptin (  TRADJENTA) 5 MG TABS tablet; Take 1 tablet (5 mg total) by mouth at bedtime.  Dispense: 90 tablet; Refill: 1  4. Endometrial cancer ([place]) Follow up with oncology as they suggested  5. Chronic kidney disease, stage III (moderate) continue to watch  6. GAD (generalized anxiety disorder) Stress management - LORazepam (ATIVAN) 0.5 MG tablet; Take 1 tablet (0.5 mg total) by mouth 2 (two) times daily as needed for anxiety or sleep.  Dispense: 60 tablet; Refill: 2  7. BMI 28.0-28.9,adult Discussed diet and exercise for person with BMI  >25 Will recheck weight in 3-6 months   8. Osteoporosis Weight bearing exercise when able- after heel heals    Labs pending Health maintenance reviewed Diet and exercise encouraged Continue all meds Follow up  In 3 months   [place], FNP

## 2016-05-07 ENCOUNTER — Other Ambulatory Visit: Payer: Self-pay | Admitting: Nurse Practitioner

## 2016-06-05 DIAGNOSIS — B351 Tinea unguium: Secondary | ICD-10-CM | POA: Diagnosis not present

## 2016-06-05 DIAGNOSIS — E1142 Type 2 diabetes mellitus with diabetic polyneuropathy: Secondary | ICD-10-CM | POA: Diagnosis not present

## 2016-06-05 DIAGNOSIS — L84 Corns and callosities: Secondary | ICD-10-CM | POA: Diagnosis not present

## 2016-06-12 DIAGNOSIS — L97511 Non-pressure chronic ulcer of other part of right foot limited to breakdown of skin: Secondary | ICD-10-CM | POA: Diagnosis not present

## 2016-06-24 DIAGNOSIS — L97511 Non-pressure chronic ulcer of other part of right foot limited to breakdown of skin: Secondary | ICD-10-CM | POA: Diagnosis not present

## 2016-06-24 DIAGNOSIS — E1142 Type 2 diabetes mellitus with diabetic polyneuropathy: Secondary | ICD-10-CM | POA: Diagnosis not present

## 2016-07-08 DIAGNOSIS — L97511 Non-pressure chronic ulcer of other part of right foot limited to breakdown of skin: Secondary | ICD-10-CM | POA: Diagnosis not present

## 2016-07-31 DIAGNOSIS — L97511 Non-pressure chronic ulcer of other part of right foot limited to breakdown of skin: Secondary | ICD-10-CM | POA: Diagnosis not present

## 2016-08-05 ENCOUNTER — Other Ambulatory Visit: Payer: Self-pay | Admitting: Nurse Practitioner

## 2016-08-05 DIAGNOSIS — E1142 Type 2 diabetes mellitus with diabetic polyneuropathy: Secondary | ICD-10-CM

## 2016-08-14 DIAGNOSIS — L97511 Non-pressure chronic ulcer of other part of right foot limited to breakdown of skin: Secondary | ICD-10-CM | POA: Diagnosis not present

## 2016-08-21 ENCOUNTER — Ambulatory Visit (INDEPENDENT_AMBULATORY_CARE_PROVIDER_SITE_OTHER): Payer: Medicare Other | Admitting: Nurse Practitioner

## 2016-08-21 ENCOUNTER — Encounter: Payer: Self-pay | Admitting: Nurse Practitioner

## 2016-08-21 VITALS — BP 135/72 | HR 96 | Temp 97.9°F | Ht 65.0 in | Wt 170.0 lb

## 2016-08-21 DIAGNOSIS — E1142 Type 2 diabetes mellitus with diabetic polyneuropathy: Secondary | ICD-10-CM | POA: Diagnosis not present

## 2016-08-21 DIAGNOSIS — I10 Essential (primary) hypertension: Secondary | ICD-10-CM

## 2016-08-21 DIAGNOSIS — F411 Generalized anxiety disorder: Secondary | ICD-10-CM

## 2016-08-21 DIAGNOSIS — E785 Hyperlipidemia, unspecified: Secondary | ICD-10-CM

## 2016-08-21 DIAGNOSIS — R350 Frequency of micturition: Secondary | ICD-10-CM

## 2016-08-21 DIAGNOSIS — K219 Gastro-esophageal reflux disease without esophagitis: Secondary | ICD-10-CM | POA: Diagnosis not present

## 2016-08-21 DIAGNOSIS — Z23 Encounter for immunization: Secondary | ICD-10-CM

## 2016-08-21 LAB — BAYER DCA HB A1C WAIVED: HB A1C (BAYER DCA - WAIVED): 7.7 % — ABNORMAL HIGH (ref <7.0)

## 2016-08-21 MED ORDER — GLIPIZIDE ER 10 MG PO TB24: 10.0000 mg | ORAL_TABLET | Freq: Every day | ORAL | 1 refills | Status: DC

## 2016-08-21 MED ORDER — DEXLANSOPRAZOLE 60 MG PO CPDR: 1.0000 | DELAYED_RELEASE_CAPSULE | Freq: Every day | ORAL | 4 refills | Status: DC

## 2016-08-21 MED ORDER — EMPAGLIFLOZIN 10 MG PO TABS: 10.0000 mg | ORAL_TABLET | Freq: Every day | ORAL | 0 refills | Status: DC

## 2016-08-21 MED ORDER — GABAPENTIN 100 MG PO CAPS: 100.0000 mg | ORAL_CAPSULE | Freq: Every day | ORAL | 5 refills | Status: DC

## 2016-08-21 MED ORDER — LORAZEPAM 0.5 MG PO TABS: 0.5000 mg | ORAL_TABLET | Freq: Two times a day (BID) | ORAL | 2 refills | Status: DC | PRN

## 2016-08-21 NOTE — Progress Notes (Signed)
Subjective:    Patient ID: Catherine Cooper, female    DOB: 10/30/1940, 75 y.o.   MRN: [id number]  Patient here today for follow up of chronic medical problems.  Outpatient Encounter Prescriptions as of 08/21/2016  Medication Sig  . ALPHAGAN P 0.1 % SOLN Place 1 drop into both eyes 2 (two) times daily.   Marland Kitchen aspirin 81 MG tablet Take 81 mg by mouth every evening.   . bifidobacterium infantis (ALIGN) capsule Take 1 capsule by mouth daily.  . calcium citrate-vitamin D (CITRACAL+D) 315-200 MG-UNIT tablet Take 1 tablet by mouth 2 (two) times daily.  . Cholecalciferol (VITAMIN D3) 2000 UNITS TABS Take 1 capsule by mouth daily.   . cholestyramine (QUESTRAN) 4 G packet Take 4 g by mouth daily.   Marland Kitchen dexlansoprazole (DEXILANT) 60 MG capsule Take 1 capsule (60 mg total) by mouth daily.  Marland Kitchen gabapentin (NEURONTIN) 100 MG capsule Take 1 capsule (100 mg total) by mouth daily.  Marland Kitchen glipiZIDE (GLIPIZIDE XL) 10 MG 24 hr tablet Take 1 tablet (10 mg total) by mouth daily with breakfast.  . ibuprofen (ADVIL,MOTRIN) 800 MG tablet Take 1 tablet (800 mg total) by mouth every 6 (six) hours as needed for pain.  Marland Kitchen ketotifen (ZADITOR) 0.025 % ophthalmic solution 1 drop 2 (two) times daily.  Marland Kitchen latanoprost (XALATAN) 0.005 % ophthalmic solution Place 1 drop into both eyes at bedtime.   Marland Kitchen linagliptin (TRADJENTA) 5 MG TABS tablet Take 1 tablet (5 mg total) by mouth at bedtime.  Marland Kitchen LORazepam (ATIVAN) 0.5 MG tablet Take 1 tablet (0.5 mg total) by mouth 2 (two) times daily as needed for anxiety or sleep.  . Multiple Vitamin (MULTI-VITAMIN DAILY PO) Take 1 tablet by mouth daily. Century Mature for adults 10+  . vitamin B-12 (CYANOCOBALAMIN) 1000 MCG tablet Take 1,000 mcg by mouth daily.       Hyperlipidemia  This is a chronic problem. The current episode started more than 1 year ago. The problem is uncontrolled. Recent lipid tests were reviewed and are variable. Associated symptoms include myalgias. Pertinent negatives include no  chest pain or shortness of breath. Current antihyperlipidemic treatment includes diet change. The current treatment provides mild improvement of lipids. Compliance problems include adherence to diet and adherence to exercise.  Risk factors for coronary artery disease include diabetes mellitus, dyslipidemia, hypertension and post-menopausal.  Hypertension  This is a chronic problem. The current episode started more than 1 year ago. The problem is unchanged. The problem is controlled. Pertinent negatives include no chest pain, headaches, palpitations or shortness of breath. Risk factors for coronary artery disease include dyslipidemia, diabetes mellitus, post-menopausal state and sedentary lifestyle. Past treatments include nothing. The current treatment provides moderate improvement. Compliance problems include diet and exercise.   Diabetes  She presents for her follow-up diabetic visit. She has type 2 diabetes mellitus. No MedicAlert identification noted. Her disease course has been stable. Pertinent negatives for hypoglycemia include no headaches. Pertinent negatives for diabetes include no chest pain, no polydipsia, no polyphagia, no polyuria and no weakness. There are no hypoglycemic complications. There are no diabetic complications. Risk factors for coronary artery disease include dyslipidemia, hypertension and post-menopausal. Current diabetic treatment includes oral agent (dual therapy). She is compliant with treatment most of the time. Her weight is stable. When asked about meal planning, she reported none. She has not had a previous visit with a dietitian. She rarely participates in exercise. Her breakfast blood glucose is taken between 8-9 am. Her breakfast blood glucose range  is generally 180-200 mg/dl. Her highest blood glucose is >200 mg/dl. Her overall blood glucose range is >200 mg/dl. An ACE inhibitor/angiotensin II receptor blocker is not being taken. She sees a podiatrist (currently seeing Dr.  Irving Shows for right foot lesion.).Eye exam is not current.  osteporosis Fosamax weekly without side effects- Very little weight bearing exercises. Gad Only takes alprazolam once in a while not daily.  gerd dexilant working well- can tell a big difference if she does not take CKD III Currently just watching labs Hx endometrial cancer No longer sees oncologist- had hysterectomey and did not need chemo or radiation. Needs PAP  Review of Systems  Constitutional: Negative.   HENT: Negative.   Respiratory: Negative for shortness of breath.   Cardiovascular: Negative for chest pain and palpitations.  Endocrine: Negative for polydipsia, polyphagia and polyuria.  Musculoskeletal: Positive for myalgias.  Neurological: Negative for weakness and headaches.  Psychiatric/Behavioral: Negative.   All other systems reviewed and are negative.      Objective:   Physical Exam  Constitutional: She is oriented to person, place, and time. She appears well-developed and well-nourished.  HENT:  Nose: Nose normal.  Mouth/Throat: Oropharynx is clear and moist.     Eyes: EOM are normal.  Neck: Trachea normal, normal range of motion and full passive range of motion without pain. Neck supple. No JVD present. Carotid bruit is not present. No thyromegaly present.  Cardiovascular: Normal rate, regular rhythm, normal heart sounds and intact distal pulses.  Exam reveals no gallop and no friction rub.   No murmur heard. Pulmonary/Chest: Effort normal and breath sounds normal.  Abdominal: Soft. Bowel sounds are normal. She exhibits no distension and no mass. There is no tenderness.  Musculoskeletal: Normal range of motion.  Lymphadenopathy:    She has no cervical adenopathy.  Neurological: She is alert and oriented to person, place, and time. She has normal reflexes.  Skin: Skin is warm and dry.  Psychiatric: She has a normal mood and affect. Her behavior is normal. Judgment and thought content normal.     BP  135/72   Pulse 96   Temp 97.9 F (36.6 C) (Oral)   Ht 5' 5" (1.651 m)   Wt 170 lb (77.1 kg)   BMI 28.29 kg/m   Hgba1c 7.7% up from 7.5%     Assessment & Plan:  1. Essential hypertension Low sodium diet - CMP14+EGFR  2. Type 2 diabetes mellitus with diabetic polyneuropathy, without long-term current use of insulin (HCC) Stricter carb counting Starting on jardiance  - Bayer DCA Hb A1c Waived - Microalbumin / creatinine urine ratio - gabapentin (NEURONTIN) 100 MG capsule; Take 1 capsule (100 mg total) by mouth daily.  Dispense: 30 capsule; Refill: 5 - glipiZIDE (GLIPIZIDE XL) 10 MG 24 hr tablet; Take 1 tablet (10 mg total) by mouth daily with breakfast.  Dispense: 90 tablet; Refill: 1 - empagliflozin (JARDIANCE) 10 MG TABS tablet; Take 10 mg by mouth daily.  Dispense: 28 tablet; Refill: 0  3. Hyperlipidemia, unspecified hyperlipidemia type Low fat diet - Lipid panel  4. Frequent urination - Urinalysis, Complete  5. GAD (generalized anxiety disorder) stress management - LORazepam (ATIVAN) 0.5 MG tablet; Take 1 tablet (0.5 mg total) by mouth 2 (two) times daily as needed for anxiety or sleep.  Dispense: 60 tablet; Refill: 2  6. Gastroesophageal reflux disease without esophagitis Avoid spicy foods Do not eat 2 hours prior to bedtime - dexlansoprazole (DEXILANT) 60 MG capsule; Take 1 capsule (60 mg total)  by mouth daily.  Dispense: 30 capsule; Refill: 4    Labs pending Health maintenance reviewed Diet and exercise encouraged Continue all meds Follow up  In 1 month recheck diabetes   [place], FNP

## 2016-08-21 NOTE — Patient Instructions (Signed)

## 2016-08-22 LAB — CMP14+EGFR
ALBUMIN: 3.9 g/dL (ref 3.5–4.8)
ALT: 42 IU/L — ABNORMAL HIGH (ref 0–32)
AST: 31 IU/L (ref 0–40)
Albumin/Globulin Ratio: 1.3 (ref 1.2–2.2)
Alkaline Phosphatase: 101 IU/L (ref 39–117)
BUN / CREAT RATIO: 11 — AB (ref 12–28)
BUN: 11 mg/dL (ref 8–27)
Bilirubin Total: 0.6 mg/dL (ref 0.0–1.2)
CALCIUM: 9 mg/dL (ref 8.7–10.3)
CO2: 28 mmol/L (ref 18–29)
CREATININE: 0.97 mg/dL (ref 0.57–1.00)
Chloride: 99 mmol/L (ref 96–106)
GFR, EST AFRICAN AMERICAN: 66 mL/min/{1.73_m2} (ref >59)
GFR, EST NON AFRICAN AMERICAN: 57 mL/min/{1.73_m2} — AB (ref >59)
GLUCOSE: 245 mg/dL — AB (ref 65–99)
Globulin, Total: 2.9 g/dL (ref 1.5–4.5)
Potassium: 4.2 mmol/L (ref 3.5–5.2)
Sodium: 142 mmol/L (ref 134–144)
TOTAL PROTEIN: 6.8 g/dL (ref 6.0–8.5)

## 2016-08-22 LAB — LIPID PANEL
Chol/HDL Ratio: 3.8 ratio units (ref 0.0–4.4)
Cholesterol, Total: 154 mg/dL (ref 100–199)
HDL: 41 mg/dL (ref >39)
LDL CALC: 59 mg/dL (ref 0–99)
Triglycerides: 269 mg/dL — ABNORMAL HIGH (ref 0–149)
VLDL CHOLESTEROL CAL: 54 mg/dL — AB (ref 5–40)

## 2016-08-22 LAB — MICROALBUMIN / CREATININE URINE RATIO
CREATININE, UR: 138.3 mg/dL
MICROALBUM., U, RANDOM: 18.9 ug/mL
Microalb/Creat Ratio: 13.7 mg/g creat (ref 0.0–30.0)

## 2016-09-09 DIAGNOSIS — L97511 Non-pressure chronic ulcer of other part of right foot limited to breakdown of skin: Secondary | ICD-10-CM | POA: Diagnosis not present

## 2016-09-23 ENCOUNTER — Encounter: Payer: Self-pay | Admitting: Nurse Practitioner

## 2016-09-23 ENCOUNTER — Ambulatory Visit (INDEPENDENT_AMBULATORY_CARE_PROVIDER_SITE_OTHER): Payer: Medicare Other | Admitting: Nurse Practitioner

## 2016-09-23 VITALS — BP 136/70 | HR 97 | Temp 97.0°F | Ht 65.0 in | Wt 170.0 lb

## 2016-09-23 DIAGNOSIS — E1142 Type 2 diabetes mellitus with diabetic polyneuropathy: Secondary | ICD-10-CM | POA: Diagnosis not present

## 2016-09-23 LAB — BAYER DCA HB A1C WAIVED: HB A1C: 7.4 % — AB (ref <7.0)

## 2016-09-23 MED ORDER — SITAGLIPTIN PHOSPHATE 100 MG PO TABS: 100.0000 mg | ORAL_TABLET | Freq: Every day | ORAL | 0 refills | Status: DC

## 2016-09-23 NOTE — Progress Notes (Signed)
   Subjective:    Patient ID: Catherine Cooper, female    DOB: 01-22-1941, 75 y.o.   MRN: [id number]  HPI Patient comes in today for a diabetes recheck- at last visit her HGBA1c was up to 7.7%- it had been gradually creeping upward for several months. We added jardiance to meds. She said that she could not take because it made her legs weak. Her blood sugars have been still been running between 150- 250.     Review of Systems  Constitutional: Negative.   HENT: Negative.   Respiratory: Negative.   Cardiovascular: Negative.   Genitourinary: Negative.   Neurological: Negative.   Psychiatric/Behavioral: Negative.   All other systems reviewed and are negative.      Objective:   Physical Exam  Constitutional: She is oriented to person, place, and time. She appears well-developed and well-nourished. No distress.  Cardiovascular: Normal rate.   Pulmonary/Chest: Effort normal and breath sounds normal.  Neurological: She is alert and oriented to person, place, and time.  Skin: Skin is warm.  Psychiatric: She has a normal mood and affect. Her behavior is normal. Judgment and thought content normal.    BP 136/70   Pulse 97   Temp 97 F (36.1 C) (Oral)   Ht 5\' 5"  (1.651 m)   Wt 170 lb (77.1 kg)   BMI 28.29 kg/m   hgba1c 7.4% down from 7.7%      Assessment & Plan:  1. Type 2 diabetes mellitus with diabetic polyneuropathy, without long-term current use of insulin ([place]) Was going to try [place] and patient said that she does not think she can take [place]. Since hgba1c has cme down some we will just let her continue diet and current meds and will see her in 2 months. - B[facility] Hb A1c Waived  [place], [place]

## 2016-09-23 NOTE — Patient Instructions (Signed)
Carbohydrate Counting for Diabetes Mellitus, Adult Carbohydrate counting is a method for keeping track of how many carbohydrates you eat. Eating carbohydrates naturally increases the amount of sugar (glucose) in the blood. Counting how many carbohydrates you eat helps keep your blood glucose within normal limits, which helps you manage your diabetes (diabetes mellitus). It is important to know how many carbohydrates you can safely have in each meal. This is different for every person. A diet and nutrition specialist (registered dietitian) can help you make a meal plan and calculate how many carbohydrates you should have at each meal and snack. Carbohydrates are found in the following foods:  Grains, such as breads and cereals.  Dried beans and soy products.  Starchy vegetables, such as potatoes, peas, and corn.  Fruit and fruit juices.  Milk and yogurt.  Sweets and snack foods, such as cake, cookies, candy, chips, and soft drinks. How do I count carbohydrates? There are two ways to count carbohydrates in food. You can use either of the methods or a combination of both. Reading "Nutrition Facts" on packaged food  The "Nutrition Facts" list is included on the labels of almost all packaged foods and beverages in the [place] It includes:  The serving size.  Information about nutrients in each serving, including the grams (g) of carbohydrate per serving. To use the "Nutrition Facts":  Decide how many servings you will have.  Multiply the number of servings by the number of carbohydrates per serving.  The resulting number is the total amount of carbohydrates that you will be having. Learning standard serving sizes of other foods  When you eat foods containing carbohydrates that are not packaged or do not include "Nutrition Facts" on the label, you need to measure the servings in order to count the amount of carbohydrates:  Measure the foods that you will eat with a food scale or measuring  cup, if needed.  Decide how many standard-size servings you will eat.  Multiply the number of servings by 15. Most carbohydrate-rich foods have about 15 g of carbohydrates per serving.  For example, if you eat 8 oz (170 g) of strawberries, you will have eaten 2 servings and 30 g of carbohydrates (2 servings x 15 g = 30 g).  For foods that have more than one food mixed, such as soups and casseroles, you must count the carbohydrates in each food that is included. The following list contains standard serving sizes of common carbohydrate-rich foods. Each of these servings has about 15 g of carbohydrates:   hamburger bun or  English muffin.   oz (15 mL) syrup.   oz (14 g) jelly.  1 slice of bread.  1 six-inch tortilla.  3 oz (85 g) cooked rice or pasta.  4 oz (113 g) cooked dried beans.  4 oz (113 g) starchy vegetable, such as peas, corn, or potatoes.  4 oz (113 g) hot cereal.  4 oz (113 g) mashed potatoes or  of a large baked potato.  4 oz (113 g) canned or frozen fruit.  4 oz (120 mL) fruit juice.  4-6 crackers.  6 chicken nuggets.  6 oz (170 g) unsweetened dry cereal.  6 oz (170 g) plain fat-free yogurt or yogurt sweetened with artificial sweeteners.  8 oz (240 mL) milk.  8 oz (170 g) fresh fruit or one small piece of fruit.  24 oz (680 g) popped popcorn. Example of carbohydrate counting Sample meal  3 oz (85 g) chicken breast.  6 oz (  170 g) brown rice.  4 oz (113 g) corn.  8 oz (240 mL) milk.  8 oz (170 g) strawberries with sugar-free whipped topping. Carbohydrate calculation 1. Identify the foods that contain carbohydrates:  Rice.  Corn.  Milk.  Strawberries. 2. Calculate how many servings you have of each food:  2 servings rice.  1 serving corn.  1 serving milk.  1 serving strawberries. 3. Multiply each number of servings by 15 g:  2 servings rice x 15 g = 30 g.  1 serving corn x 15 g = 15 g.  1 serving milk x 15 g = 15  g.  1 serving strawberries x 15 g = 15 g. 4. Add together all of the amounts to find the total grams of carbohydrates eaten:  30 g + 15 g + 15 g + 15 g = 75 g of carbohydrates total. This information is not intended to replace advice given to you by your health care provider. Make sure you discuss any questions you have with your health care provider. Document Released: 09/29/2005 Document Revised: 04/18/2016 Document Reviewed: 03/12/2016 Elsevier Interactive Patient Education  2017 [employer]  

## 2016-09-25 DIAGNOSIS — L97521 Non-pressure chronic ulcer of other part of left foot limited to breakdown of skin: Secondary | ICD-10-CM | POA: Diagnosis not present

## 2016-10-01 DIAGNOSIS — H401213 Low-tension glaucoma, right eye, severe stage: Secondary | ICD-10-CM | POA: Diagnosis not present

## 2016-10-01 DIAGNOSIS — H401221 Low-tension glaucoma, left eye, mild stage: Secondary | ICD-10-CM | POA: Diagnosis not present

## 2016-10-14 DIAGNOSIS — L97511 Non-pressure chronic ulcer of other part of right foot limited to breakdown of skin: Secondary | ICD-10-CM | POA: Diagnosis not present

## 2016-11-03 ENCOUNTER — Other Ambulatory Visit: Payer: Self-pay | Admitting: Nurse Practitioner

## 2016-11-06 DIAGNOSIS — L97511 Non-pressure chronic ulcer of other part of right foot limited to breakdown of skin: Secondary | ICD-10-CM | POA: Diagnosis not present

## 2016-11-13 ENCOUNTER — Encounter: Payer: Self-pay | Admitting: Nurse Practitioner

## 2016-11-13 ENCOUNTER — Ambulatory Visit (INDEPENDENT_AMBULATORY_CARE_PROVIDER_SITE_OTHER): Payer: Medicare Other | Admitting: Nurse Practitioner

## 2016-11-13 VITALS — BP 139/81 | HR 97 | Temp 100.0°F | Ht 65.0 in | Wt 168.0 lb

## 2016-11-13 DIAGNOSIS — J101 Influenza due to other identified influenza virus with other respiratory manifestations: Secondary | ICD-10-CM | POA: Diagnosis not present

## 2016-11-13 DIAGNOSIS — R0989 Other specified symptoms and signs involving the circulatory and respiratory systems: Secondary | ICD-10-CM

## 2016-11-13 LAB — VERITOR FLU A/B WAIVED
INFLUENZA A: POSITIVE — AB
INFLUENZA B: NEGATIVE

## 2016-11-13 MED ORDER — OSELTAMIVIR PHOSPHATE 75 MG PO CAPS: 75.0000 mg | ORAL_CAPSULE | Freq: Two times a day (BID) | ORAL | 0 refills | Status: DC

## 2016-11-13 MED ORDER — HYDROCODONE-HOMATROPINE 5-1.5 MG/5ML PO SYRP: 5.0000 mL | ORAL_SOLUTION | Freq: Four times a day (QID) | ORAL | 0 refills | Status: DC | PRN

## 2016-11-13 NOTE — Progress Notes (Signed)
Subjective:    Patient ID: Catherine Cooper, female    DOB: 04-13-1941, 76 y.o.   MRN: [id number]  HPI Patient comes in today c/o cuogh and congestion that started 2 day sago- low grade fever developed yesterday.    Review of Systems  Constitutional: Positive for fever. Negative for appetite change and chills.  HENT: Positive for congestion and sneezing. Negative for ear pain, sore throat, trouble swallowing and voice change.   Respiratory: Positive for cough. Negative for shortness of breath.   Cardiovascular: Negative.   Gastrointestinal: Negative.   Genitourinary: Negative.   Neurological: Negative.   Psychiatric/Behavioral: Negative.   All other systems reviewed and are negative.      Objective:   Physical Exam  Constitutional: She is oriented to person, place, and time. She appears well-developed and well-nourished. No distress.  HENT:  Right Ear: Hearing, tympanic membrane, external ear and ear canal normal.  Left Ear: Hearing, tympanic membrane, external ear and ear canal normal.  Nose: Mucosal edema and rhinorrhea present. Right sinus exhibits no maxillary sinus tenderness and no frontal sinus tenderness. Left sinus exhibits no maxillary sinus tenderness and no frontal sinus tenderness.  Mouth/Throat: Uvula is midline, oropharynx is clear and moist and mucous membranes are normal.  Neck: Normal range of motion. Neck supple.  Cardiovascular: Normal rate and regular rhythm.   Pulmonary/Chest: Effort normal and breath sounds normal.  Neurological: She is alert and oriented to person, place, and time.  Skin: Skin is warm.  Psychiatric: She has a normal mood and affect. Her behavior is normal. Judgment and thought content normal.    BP 139/81   Pulse 97   Temp 100 F (37.8 C) (Oral)   Ht 5\' 5"  (1.651 m)   Wt 168 lb (76.2 kg)   BMI 27.96 kg/m   Influenza A (+)     Assessment & Plan:  1. Chest congestion - Veritor Flu A/B Waived  2. Influenza A 1. Take meds as  prescribed 2. Use a cool mist humidifier especially during the winter months and when heat has been humid. 3. Use saline nose sprays frequently 4. Saline irrigations of the nose can be very helpful if done frequently.  * 4X daily for 1 week*  * Use of a nettie pot can be helpful with this. Follow directions with this* 5. Drink plenty of fluids 6. Keep thermostat turn down low 7.For any cough or congestion  Use plain Mucinex- regular strength or max strength is fine   * Children- consult with Pharmacist for dosing 8. For fever or aces or pains- take tylenol or ibuprofen appropriate for age and weight.  * for fevers greater than 101 orally you may alternate ibuprofen and tylenol every  3 hours.   Meds ordered this encounter  Medications  . oseltamivir (TAMIFLU) 75 MG capsule    Sig: Take 1 capsule (75 mg total) by mouth 2 (two) times daily.    Dispense:  10 capsule    Refill:  0    Order Specific Question:   Supervising Provider    Answer:   VINCENT, CAROL L [[id number]]  . HYDROcodone-homatropine (HYCODAN) 5-1.5 MG/5ML syrup    Sig: Take 5 mLs by mouth every 6 (six) hours as needed for cough.    Dispense:  120 mL    Refill:  0    Order Specific Question:   Supervising Provider    Answer:   Eustaquio Maize [[id number]]    Mary-Margaret Hassell Done, FNP

## 2016-11-13 NOTE — Patient Instructions (Signed)

## 2016-11-20 DIAGNOSIS — H1851 Endothelial corneal dystrophy: Secondary | ICD-10-CM | POA: Diagnosis not present

## 2016-11-20 DIAGNOSIS — H401221 Low-tension glaucoma, left eye, mild stage: Secondary | ICD-10-CM | POA: Diagnosis not present

## 2016-11-20 DIAGNOSIS — H401213 Low-tension glaucoma, right eye, severe stage: Secondary | ICD-10-CM | POA: Diagnosis not present

## 2016-11-27 DIAGNOSIS — L97511 Non-pressure chronic ulcer of other part of right foot limited to breakdown of skin: Secondary | ICD-10-CM | POA: Diagnosis not present

## 2016-12-04 ENCOUNTER — Encounter: Payer: Self-pay | Admitting: Nurse Practitioner

## 2016-12-04 ENCOUNTER — Ambulatory Visit (INDEPENDENT_AMBULATORY_CARE_PROVIDER_SITE_OTHER): Payer: Medicare Other | Admitting: Nurse Practitioner

## 2016-12-04 VITALS — BP 136/71 | HR 93 | Temp 98.6°F | Ht 65.0 in | Wt 169.0 lb

## 2016-12-04 DIAGNOSIS — Z01419 Encounter for gynecological examination (general) (routine) without abnormal findings: Secondary | ICD-10-CM | POA: Diagnosis not present

## 2016-12-04 DIAGNOSIS — F411 Generalized anxiety disorder: Secondary | ICD-10-CM

## 2016-12-04 DIAGNOSIS — E1142 Type 2 diabetes mellitus with diabetic polyneuropathy: Secondary | ICD-10-CM | POA: Diagnosis not present

## 2016-12-04 DIAGNOSIS — I10 Essential (primary) hypertension: Secondary | ICD-10-CM | POA: Diagnosis not present

## 2016-12-04 DIAGNOSIS — N183 Chronic kidney disease, stage 3 unspecified: Secondary | ICD-10-CM

## 2016-12-04 DIAGNOSIS — M810 Age-related osteoporosis without current pathological fracture: Secondary | ICD-10-CM | POA: Diagnosis not present

## 2016-12-04 DIAGNOSIS — Z6828 Body mass index (BMI) 28.0-28.9, adult: Secondary | ICD-10-CM | POA: Diagnosis not present

## 2016-12-04 DIAGNOSIS — C541 Malignant neoplasm of endometrium: Secondary | ICD-10-CM | POA: Diagnosis not present

## 2016-12-04 DIAGNOSIS — E785 Hyperlipidemia, unspecified: Secondary | ICD-10-CM | POA: Diagnosis not present

## 2016-12-04 DIAGNOSIS — Z9189 Other specified personal risk factors, not elsewhere classified: Secondary | ICD-10-CM

## 2016-12-04 DIAGNOSIS — K219 Gastro-esophageal reflux disease without esophagitis: Secondary | ICD-10-CM

## 2016-12-04 LAB — MICROSCOPIC EXAMINATION
Bacteria, UA: NONE SEEN
Renal Epithel, UA: NONE SEEN /hpf

## 2016-12-04 LAB — CMP14+EGFR
A/G RATIO: 1.4 (ref 1.2–2.2)
ALT: 22 IU/L (ref 0–32)
AST: 18 IU/L (ref 0–40)
Albumin: 3.7 g/dL (ref 3.5–4.8)
Alkaline Phosphatase: 100 IU/L (ref 39–117)
BILIRUBIN TOTAL: 0.4 mg/dL (ref 0.0–1.2)
BUN / CREAT RATIO: 14 (ref 12–28)
BUN: 14 mg/dL (ref 8–27)
CHLORIDE: 100 mmol/L (ref 96–106)
CO2: 25 mmol/L (ref 18–29)
Calcium: 8.8 mg/dL (ref 8.7–10.3)
Creatinine, Ser: 1 mg/dL (ref 0.57–1.00)
GFR calc non Af Amer: 55 — ABNORMAL LOW (ref >59)
GFR, EST AFRICAN AMERICAN: 63 (ref >59)
GLOBULIN, TOTAL: 2.7 (ref 1.5–4.5)
Glucose: 258 mg/dL — ABNORMAL HIGH (ref 65–99)
POTASSIUM: 4.4 mmol/L (ref 3.5–5.2)
SODIUM: 142 mmol/L (ref 134–144)
TOTAL PROTEIN: 6.4 g/dL (ref 6.0–8.5)

## 2016-12-04 LAB — LIPID PANEL
CHOL/HDL RATIO: 4.1 (ref 0.0–4.4)
Cholesterol, Total: 156 mg/dL (ref 100–199)
HDL: 38 mg/dL — AB (ref >39)
LDL Calculated: 66 (ref 0–99)
Triglycerides: 259 mg/dL — ABNORMAL HIGH (ref 0–149)
VLDL Cholesterol Cal: 52 — ABNORMAL HIGH (ref 5–40)

## 2016-12-04 LAB — URINALYSIS, COMPLETE
Bilirubin, UA: NEGATIVE
GLUCOSE, UA: NEGATIVE
Nitrite, UA: NEGATIVE
Protein, UA: NEGATIVE
SPEC GRAV UA: 1.015 (ref 1.005–1.030)
UUROB: 0.2 mg/dL (ref 0.2–1.0)
pH, UA: 6.5 (ref 5.0–7.5)

## 2016-12-04 LAB — BAYER DCA HB A1C WAIVED: HB A1C (BAYER DCA - WAIVED): 8 % — ABNORMAL HIGH (ref <7.0)

## 2016-12-04 MED ORDER — GLIPIZIDE ER 10 MG PO TB24: 10.0000 mg | ORAL_TABLET | Freq: Every day | ORAL | 1 refills | Status: DC

## 2016-12-04 MED ORDER — LINAGLIPTIN 5 MG PO TABS: 5.0000 mg | ORAL_TABLET | Freq: Every day | ORAL | 1 refills | Status: DC

## 2016-12-04 MED ORDER — LORAZEPAM 0.5 MG PO TABS: 0.5000 mg | ORAL_TABLET | Freq: Two times a day (BID) | ORAL | 2 refills | Status: DC | PRN

## 2016-12-04 MED ORDER — DEXLANSOPRAZOLE 60 MG PO CPDR: 1.0000 | DELAYED_RELEASE_CAPSULE | Freq: Every day | ORAL | 4 refills | Status: DC

## 2016-12-04 NOTE — Progress Notes (Signed)
Subjective:    Patient ID: Catherine Cooper, female    DOB: May 17, 1941, 76 y.o.   MRN: [id number]  Patient here today for Pap, breast exam and  follow up of chronic medical problems. SHe had the flu 2 weeks ago- says that she is doing much better. Still has slight cough but otherwise is doing well.  Outpatient Encounter Prescriptions as of 08/21/2016  Medication Sig  . ALPHAGAN P 0.1 % SOLN Place 1 drop into both eyes 2 (two) times daily.   Marland Kitchen aspirin 81 MG tablet Take 81 mg by mouth every evening.   . bifidobacterium infantis (ALIGN) capsule Take 1 capsule by mouth daily.  . calcium citrate-vitamin D (CITRACAL+D) 315-200 MG-UNIT tablet Take 1 tablet by mouth 2 (two) times daily.  . Cholecalciferol (VITAMIN D3) 2000 UNITS TABS Take 1 capsule by mouth daily.   . cholestyramine (QUESTRAN) 4 G packet Take 4 g by mouth daily.   Marland Kitchen dexlansoprazole (DEXILANT) 60 MG capsule Take 1 capsule (60 mg total) by mouth daily.  Marland Kitchen gabapentin (NEURONTIN) 100 MG capsule Take 1 capsule (100 mg total) by mouth daily.  Marland Kitchen glipiZIDE (GLIPIZIDE XL) 10 MG 24 hr tablet Take 1 tablet (10 mg total) by mouth daily with breakfast.  . ibuprofen (ADVIL,MOTRIN) 800 MG tablet Take 1 tablet (800 mg total) by mouth every 6 (six) hours as needed for pain.  Marland Kitchen ketotifen (ZADITOR) 0.025 % ophthalmic solution 1 drop 2 (two) times daily.  Marland Kitchen latanoprost (XALATAN) 0.005 % ophthalmic solution Place 1 drop into both eyes at bedtime.   Marland Kitchen linagliptin (TRADJENTA) 5 MG TABS tablet Take 1 tablet (5 mg total) by mouth at bedtime.  Marland Kitchen LORazepam (ATIVAN) 0.5 MG tablet Take 1 tablet (0.5 mg total) by mouth 2 (two) times daily as needed for anxiety or sleep.  . Multiple Vitamin (MULTI-VITAMIN DAILY PO) Take 1 tablet by mouth daily. Century Mature for adults 37+  . vitamin B-12 (CYANOCOBALAMIN) 1000 MCG tablet Take 1,000 mcg by mouth daily.       Hyperlipidemia  This is a chronic problem. The current episode started more than 1 year ago. The problem  is uncontrolled. Recent lipid tests were reviewed and are variable. Associated symptoms include myalgias. Pertinent negatives include no chest pain or shortness of breath. Current antihyperlipidemic treatment includes diet change. The current treatment provides mild improvement of lipids. Compliance problems include adherence to diet and adherence to exercise.  Risk factors for coronary artery disease include diabetes mellitus, dyslipidemia, hypertension and post-menopausal.  Hypertension  This is a chronic problem. The current episode started more than 1 year ago. The problem is unchanged. The problem is controlled. Pertinent negatives include no chest pain, headaches, palpitations or shortness of breath. Risk factors for coronary artery disease include dyslipidemia, diabetes mellitus, post-menopausal state and sedentary lifestyle. Past treatments include nothing. The current treatment provides moderate improvement. Compliance problems include diet and exercise.   Diabetes  She presents for her follow-up diabetic visit. She has type 2 diabetes mellitus. No MedicAlert identification noted. Her disease course has been stable. Pertinent negatives for hypoglycemia include no headaches. Pertinent negatives for diabetes include no chest pain, no polydipsia, no polyphagia, no polyuria and no weakness. There are no hypoglycemic complications. There are no diabetic complications. Risk factors for coronary artery disease include dyslipidemia, hypertension and post-menopausal. Current diabetic treatment includes oral agent (dual therapy). She is compliant with treatment most of the time. Her weight is stable. When asked about meal planning, she reported none. She  has not had a previous visit with a dietitian. She rarely participates in exercise. Her breakfast blood glucose is taken between 8-9 am. Her breakfast blood glucose range is generally 180-200 mg/dl. Her highest blood glucose is >200 mg/dl. Her overall blood  glucose range is >200 mg/dl. An ACE inhibitor/angiotensin II receptor blocker is not being taken. She sees a podiatrist (currently seeing Dr. Irving Shows for right foot lesion.).Eye exam is not current.  osteporosis Fosamax weekly without side effects- Very little weight bearing exercises. Gad Only takes alprazolam once in a while not daily.  gerd dexilant working well- can tell a big difference if she does not take CKD III Currently just watching labs Hx endometrial cancer No longer sees oncologist- had hysterectomey and did not need chemo or radiation. Needs PAP  Review of Systems  Constitutional: Negative.   HENT: Negative.   Respiratory: Negative for shortness of breath.   Cardiovascular: Negative for chest pain and palpitations.  Endocrine: Negative for polydipsia, polyphagia and polyuria.  Musculoskeletal: Positive for myalgias.  Neurological: Negative for weakness and headaches.  Psychiatric/Behavioral: Negative.   All other systems reviewed and are negative.      Objective:   Physical Exam  Constitutional: She is oriented to person, place, and time. She appears well-developed and well-nourished.  HENT:  Nose: Nose normal.  Mouth/Throat: Oropharynx is clear and moist.     Eyes: EOM are normal.  Neck: Trachea normal, normal range of motion and full passive range of motion without pain. Neck supple. No JVD present. Carotid bruit is not present. No thyromegaly present.  Cardiovascular: Normal rate, regular rhythm, normal heart sounds and intact distal pulses.  Exam reveals no gallop and no friction rub.   No murmur heard. Pulmonary/Chest: Effort normal and breath sounds normal. Right breast exhibits no inverted nipple, no mass, no nipple discharge, no skin change and no tenderness. Left breast exhibits no inverted nipple, no mass, no nipple discharge, no skin change and no tenderness. Breasts are symmetrical.  Abdominal: Soft. Bowel sounds are normal. She exhibits no distension  and no mass. There is no tenderness.  Genitourinary: Vagina normal. No vaginal discharge found.  Genitourinary Comments: Vaginal cuff intact No adnexal masses or tenderness  Musculoskeletal: Normal range of motion.  Lymphadenopathy:    She has no cervical adenopathy.  Neurological: She is alert and oriented to person, place, and time. She has normal reflexes.  Skin: Skin is warm and dry.  Psychiatric: She has a normal mood and affect. Her behavior is normal. Judgment and thought content normal.     BP 136/71   Pulse 93   Temp 98.6 F (37 C) (Oral)   Ht _0  (1.651 m)   Wt 169 lb (76.7 kg)   BMI 28.12 kg/m    Hgba1c 8.0% up from 7.7%     Assessment & Plan:  1. GYN exam for high-risk Medicare patient - Pap IG (Image Guided) - Urinalysis, Complete  2. Type 2 diabetes mellitus with diabetic polyneuropathy, without long-term current use of insulin (HCC) Stricter carb counting- if HGBa1c continues to increase may need to go on injectables - Bayer DCA Hb A1c Waived - linagliptin (TRADJENTA) 5 MG TABS tablet; Take 1 tablet (5 mg total) by mouth at bedtime.  Dispense: 90 tablet; Refill: 1 - glipiZIDE (GLIPIZIDE XL) 10 MG 24 hr tablet; Take 1 tablet (10 mg total) by mouth daily with breakfast.  Dispense: 90 tablet; Refill: 1  3. Essential hypertension Low sodium diet - CMP14+EGFR  4.  Hyperlipidemia, unspecified hyperlipidemia type Low fat diet - Lipid panel  5. Gastroesophageal reflux disease without esophagitis Avoid spicy foods Do not eat 2 hours prior to bedtime - dexlansoprazole (DEXILANT) 60 MG capsule; Take 1 capsule (60 mg total) by mouth daily.  Dispense: 30 capsule; Refill: 4  6. Age-related osteoporosis without current pathological fracture Weight bearing exercises encouraged  7. Chronic kidney disease, stage III (moderate) Will watch labs  8. Endometrial cancer ([place]) - Pap IG (Image Guided)  9. BMI 28.0-28.9,adult Discussed diet and exercise for person  with BMI >25 Will recheck weight in 3-6 months  10. GAD (generalized anxiety disorder) Stress management - LORazepam (ATIVAN) 0.5 MG tablet; Take 1 tablet (0.5 mg total) by mouth 2 (two) times daily as needed for anxiety or sleep.  Dispense: 60 tablet; Refill: 2   Follow up in 3 months  [place], FNP

## 2016-12-04 NOTE — Patient Instructions (Signed)
Carbohydrate Counting for Diabetes Mellitus, Adult Carbohydrate counting is a method for keeping track of how many carbohydrates you eat. Eating carbohydrates naturally increases the amount of sugar (glucose) in the blood. Counting how many carbohydrates you eat helps keep your blood glucose within normal limits, which helps you manage your diabetes (diabetes mellitus). It is important to know how many carbohydrates you can safely have in each meal. This is different for every person. A diet and nutrition specialist (registered dietitian) can help you make a meal plan and calculate how many carbohydrates you should have at each meal and snack. Carbohydrates are found in the following foods:  Grains, such as breads and cereals.  Dried beans and soy products.  Starchy vegetables, such as potatoes, peas, and corn.  Fruit and fruit juices.  Milk and yogurt.  Sweets and snack foods, such as cake, cookies, candy, chips, and soft drinks. How do I count carbohydrates? There are two ways to count carbohydrates in food. You can use either of the methods or a combination of both. Reading "Nutrition Facts" on packaged food  The "Nutrition Facts" list is included on the labels of almost all packaged foods and beverages in the [place] It includes:  The serving size.  Information about nutrients in each serving, including the grams (g) of carbohydrate per serving. To use the "Nutrition Facts":  Decide how many servings you will have.  Multiply the number of servings by the number of carbohydrates per serving.  The resulting number is the total amount of carbohydrates that you will be having. Learning standard serving sizes of other foods  When you eat foods containing carbohydrates that are not packaged or do not include "Nutrition Facts" on the label, you need to measure the servings in order to count the amount of carbohydrates:  Measure the foods that you will eat with a food scale or measuring  cup, if needed.  Decide how many standard-size servings you will eat.  Multiply the number of servings by 15. Most carbohydrate-rich foods have about 15 g of carbohydrates per serving.  For example, if you eat 8 oz (170 g) of strawberries, you will have eaten 2 servings and 30 g of carbohydrates (2 servings x 15 g = 30 g).  For foods that have more than one food mixed, such as soups and casseroles, you must count the carbohydrates in each food that is included. The following list contains standard serving sizes of common carbohydrate-rich foods. Each of these servings has about 15 g of carbohydrates:   hamburger bun or  English muffin.   oz (15 mL) syrup.   oz (14 g) jelly.  1 slice of bread.  1 six-inch tortilla.  3 oz (85 g) cooked rice or pasta.  4 oz (113 g) cooked dried beans.  4 oz (113 g) starchy vegetable, such as peas, corn, or potatoes.  4 oz (113 g) hot cereal.  4 oz (113 g) mashed potatoes or  of a large baked potato.  4 oz (113 g) canned or frozen fruit.  4 oz (120 mL) fruit juice.  4-6 crackers.  6 chicken nuggets.  6 oz (170 g) unsweetened dry cereal.  6 oz (170 g) plain fat-free yogurt or yogurt sweetened with artificial sweeteners.  8 oz (240 mL) milk.  8 oz (170 g) fresh fruit or one small piece of fruit.  24 oz (680 g) popped popcorn. Example of carbohydrate counting Sample meal  3 oz (85 g) chicken breast.  6 oz (  170 g) brown rice.  4 oz (113 g) corn.  8 oz (240 mL) milk.  8 oz (170 g) strawberries with sugar-free whipped topping. Carbohydrate calculation 1. Identify the foods that contain carbohydrates:  Rice.  Corn.  Milk.  Strawberries. 2. Calculate how many servings you have of each food:  2 servings rice.  1 serving corn.  1 serving milk.  1 serving strawberries. 3. Multiply each number of servings by 15 g:  2 servings rice x 15 g = 30 g.  1 serving corn x 15 g = 15 g.  1 serving milk x 15 g = 15  g.  1 serving strawberries x 15 g = 15 g. 4. Add together all of the amounts to find the total grams of carbohydrates eaten:  30 g + 15 g + 15 g + 15 g = 75 g of carbohydrates total. This information is not intended to replace advice given to you by your health care provider. Make sure you discuss any questions you have with your health care provider. Document Released: 09/29/2005 Document Revised: 04/18/2016 Document Reviewed: 03/12/2016 Elsevier Interactive Patient Education  2017 [employer]  

## 2016-12-05 LAB — PAP IG (IMAGE GUIDED): PAP Smear Comment: 0

## 2016-12-08 ENCOUNTER — Other Ambulatory Visit: Payer: Self-pay | Admitting: *Deleted

## 2016-12-08 MED ORDER — ONETOUCH ULTRA BLUE VI STRP: ORAL_STRIP | 11 refills | Status: DC

## 2017-01-01 DIAGNOSIS — L97521 Non-pressure chronic ulcer of other part of left foot limited to breakdown of skin: Secondary | ICD-10-CM | POA: Diagnosis not present

## 2017-01-07 ENCOUNTER — Encounter: Payer: Medicare Other | Admitting: *Deleted

## 2017-01-07 DIAGNOSIS — Z1231 Encounter for screening mammogram for malignant neoplasm of breast: Secondary | ICD-10-CM | POA: Diagnosis not present

## 2017-01-08 DIAGNOSIS — E113492 Type 2 diabetes mellitus with severe nonproliferative diabetic retinopathy without macular edema, left eye: Secondary | ICD-10-CM | POA: Diagnosis not present

## 2017-01-08 DIAGNOSIS — H35363 Drusen (degenerative) of macula, bilateral: Secondary | ICD-10-CM | POA: Diagnosis not present

## 2017-01-08 DIAGNOSIS — H353131 Nonexudative age-related macular degeneration, bilateral, early dry stage: Secondary | ICD-10-CM | POA: Diagnosis not present

## 2017-01-08 DIAGNOSIS — E113551 Type 2 diabetes mellitus with stable proliferative diabetic retinopathy, right eye: Secondary | ICD-10-CM | POA: Diagnosis not present

## 2017-01-08 LAB — HM DIABETES EYE EXAM

## 2017-02-10 DIAGNOSIS — L97511 Non-pressure chronic ulcer of other part of right foot limited to breakdown of skin: Secondary | ICD-10-CM | POA: Diagnosis not present

## 2017-03-05 ENCOUNTER — Encounter: Payer: Self-pay | Admitting: Nurse Practitioner

## 2017-03-05 ENCOUNTER — Ambulatory Visit (INDEPENDENT_AMBULATORY_CARE_PROVIDER_SITE_OTHER): Payer: Medicare Other | Admitting: Nurse Practitioner

## 2017-03-05 VITALS — BP 115/62 | HR 80 | Temp 97.9°F | Ht 65.0 in | Wt 165.0 lb

## 2017-03-05 DIAGNOSIS — M810 Age-related osteoporosis without current pathological fracture: Secondary | ICD-10-CM

## 2017-03-05 DIAGNOSIS — L989 Disorder of the skin and subcutaneous tissue, unspecified: Secondary | ICD-10-CM | POA: Diagnosis not present

## 2017-03-05 DIAGNOSIS — E1142 Type 2 diabetes mellitus with diabetic polyneuropathy: Secondary | ICD-10-CM | POA: Diagnosis not present

## 2017-03-05 DIAGNOSIS — N183 Chronic kidney disease, stage 3 unspecified: Secondary | ICD-10-CM

## 2017-03-05 DIAGNOSIS — F411 Generalized anxiety disorder: Secondary | ICD-10-CM

## 2017-03-05 DIAGNOSIS — Z6828 Body mass index (BMI) 28.0-28.9, adult: Secondary | ICD-10-CM | POA: Diagnosis not present

## 2017-03-05 DIAGNOSIS — K219 Gastro-esophageal reflux disease without esophagitis: Secondary | ICD-10-CM | POA: Diagnosis not present

## 2017-03-05 DIAGNOSIS — E785 Hyperlipidemia, unspecified: Secondary | ICD-10-CM

## 2017-03-05 DIAGNOSIS — I10 Essential (primary) hypertension: Secondary | ICD-10-CM | POA: Diagnosis not present

## 2017-03-05 LAB — BAYER DCA HB A1C WAIVED: HB A1C: 7.4 % — AB (ref <7.0)

## 2017-03-05 MED ORDER — DEXLANSOPRAZOLE 60 MG PO CPDR: 1.0000 | DELAYED_RELEASE_CAPSULE | Freq: Every day | ORAL | 4 refills | Status: DC

## 2017-03-05 MED ORDER — LINAGLIPTIN 5 MG PO TABS: 5.0000 mg | ORAL_TABLET | Freq: Every day | ORAL | 1 refills | Status: DC

## 2017-03-05 MED ORDER — GABAPENTIN 100 MG PO CAPS: 100.0000 mg | ORAL_CAPSULE | Freq: Every day | ORAL | 5 refills | Status: DC

## 2017-03-05 MED ORDER — ONETOUCH ULTRA BLUE VI STRP: ORAL_STRIP | 11 refills | Status: DC

## 2017-03-05 MED ORDER — GLIPIZIDE ER 10 MG PO TB24: 10.0000 mg | ORAL_TABLET | Freq: Every day | ORAL | 1 refills | Status: DC

## 2017-03-05 NOTE — Progress Notes (Signed)
Subjective:    Patient ID: Catherine Cooper, female    DOB: 07/24/1941, 76 y.o.   MRN: [id number]  HPI  Catherine Cooper is here today for follow up of chronic medical problem.  Outpatient Encounter Prescriptions as of 03/05/2017  Medication Sig  . ALPHAGAN P 0.1 % SOLN Place 1 drop into both eyes 2 (two) times daily.   Marland Kitchen aspirin 81 MG tablet Take 81 mg by mouth every evening.   . bifidobacterium infantis (ALIGN) capsule Take 1 capsule by mouth daily.  . calcium citrate-vitamin D (CITRACAL+D) 315-200 MG-UNIT tablet Take 1 tablet by mouth 2 (two) times daily.  . Cholecalciferol (VITAMIN D3) 2000 UNITS TABS Take 1 capsule by mouth daily.   . cholestyramine (QUESTRAN) 4 G packet Take 4 g by mouth daily.   Marland Kitchen dexlansoprazole (DEXILANT) 60 MG capsule Take 1 capsule (60 mg total) by mouth daily.  Marland Kitchen gabapentin (NEURONTIN) 100 MG capsule Take 1 capsule (100 mg total) by mouth daily.  Marland Kitchen glipiZIDE (GLIPIZIDE XL) 10 MG 24 hr tablet Take 1 tablet (10 mg total) by mouth daily with breakfast.  . ibuprofen (ADVIL,MOTRIN) 800 MG tablet Take 1 tablet (800 mg total) by mouth every 6 (six) hours as needed for pain.  Marland Kitchen latanoprost (XALATAN) 0.005 % ophthalmic solution Place 1 drop into both eyes at bedtime.   Marland Kitchen linagliptin (TRADJENTA) 5 MG TABS tablet Take 1 tablet (5 mg total) by mouth at bedtime.  Marland Kitchen LORazepam (ATIVAN) 0.5 MG tablet Take 1 tablet (0.5 mg total) by mouth 2 (two) times daily as needed for anxiety or sleep.  . Multiple Vitamins-Minerals (PRESERVISION/LUTEIN PO) Take by mouth.  . ONE TOUCH ULTRA TEST test strip Test BS once daily and PRN dx.E11.9  . vitamin B-12 (CYANOCOBALAMIN) 1000 MCG tablet Take 1,000 mcg by mouth daily.     No facility-administered encounter medications on file as of 03/05/2017.     1. Type 2 diabetes mellitus with diabetic polyneuropathy, without long-term current use of insulin ([place]) last hgba1c was 8.0%.Made no med changes patient was going  To do better on diet. Denies  any hypoglycemia. Patient was changed to tradjenta in January because of insurance- se say staht her fasting blood sugars were better when she was on onglyza.  2. Essential hypertension  No c/o chest pain, sob or ha- does not check blood pressures at home  3. Hyperlipidemia, unspecified hyperlipidemia type  Trying  To watch diet  4. Gastroesophageal reflux disease without esophagitis  dexilant works well for her- has no symptoma as long as takes meds  5. Age-related osteoporosis without current pathological fracture  Does very little weight bearing exercise  6. Chronic kidney disease, stage III (moderate)  Currently just watching labs  7. BMI 28.0-28.9,adult  No recent weigh gain or weight loss  8. GAD (generalized anxiety disorder)  Takes ativan when needed- has not needed asa much since no longer having to take care of mother in law    New complaints: Left forehead lesion- scaly - has tried OTC wart meds - no help     Review of Systems  Constitutional: Negative for diaphoresis.  Eyes: Negative for pain.  Respiratory: Negative for shortness of breath.   Cardiovascular: Negative for chest pain, palpitations and leg swelling.  Gastrointestinal: Negative for abdominal pain.  Endocrine: Negative for polydipsia.  Skin: Negative for rash.  Neurological: Negative for dizziness, weakness and headaches.  Hematological: Does not bruise/bleed easily.       Objective:  Physical Exam  Constitutional: She is oriented to person, place, and time. She appears well-developed and well-nourished.  HENT:  Nose: Nose normal.  Mouth/Throat: Oropharynx is clear and moist.  Eyes: EOM are normal.  Neck: Trachea normal, normal range of motion and full passive range of motion without pain. Neck supple. No JVD present. Carotid bruit is not present. No thyromegaly present.  Cardiovascular: Normal rate, regular rhythm, normal heart sounds and intact distal pulses.  Exam reveals no gallop and no  friction rub.   No murmur heard. Pulmonary/Chest: Effort normal and breath sounds normal.  Abdominal: Soft. Bowel sounds are normal. She exhibits no distension and no mass. There is no tenderness.  Musculoskeletal: Normal range of motion.  Lymphadenopathy:    She has no cervical adenopathy.  Neurological: She is alert and oriented to person, place, and time. She has normal reflexes.  Skin: Skin is warm and dry.  scaly annular lesion to left side of forehead  Psychiatric: She has a normal mood and affect. Her behavior is normal. Judgment and thought content normal.   BP 115/62   Pulse 80   Temp 97.9 F (36.6 C) (Oral)   Ht 5' 5"  (1.651 m)   Wt 165 lb (74.8 kg)   BMI 27.46 kg/m      Assessment & Plan:  1. Type 2 diabetes mellitus with diabetic polyneuropathy, without long-term current use of insulin (HCC) Continue to watch carbs in diet Keep diaryu of blood sugars Patient will call insurance and see how much onglyza cost because she thinks that worked better- even though HGBA1C has improved with tradjenta - Bayer DCA Hb A1c Waived - gabapentin (NEURONTIN) 100 MG capsule; Take 1 capsule (100 mg total) by mouth daily.  Dispense: 30 capsule; Refill: 5 - glipiZIDE (GLIPIZIDE XL) 10 MG 24 hr tablet; Take 1 tablet (10 mg total) by mouth daily with breakfast.  Dispense: 90 tablet; Refill: 1 - linagliptin (TRADJENTA) 5 MG TABS tablet; Take 1 tablet (5 mg total) by mouth at bedtime.  Dispense: 90 tablet; Refill: 1  2. Essential hypertension Low sodium diet - CMP14+EGFR  3. Hyperlipidemia, unspecified hyperlipidemia type Low fat diet - Lipid panel  4. Gastroesophageal reflux disease without esophagitis Avoid spicy foods Do not eat 2 hours prior to bedtime - dexlansoprazole (DEXILANT) 60 MG capsule; Take 1 capsule (60 mg total) by mouth daily.  Dispense: 30 capsule; Refill: 4  5. Age-related osteoporosis without current pathological fracture Weight bearing exercise encouraged  6.  Chronic kidney disease, stage III (moderate) wil continue to watch labs  7. BMI 28.0-28.9,adult Discussed diet and exercise for person with BMI >25 Will recheck weight in 3-6 months  8. GAD (generalized anxiety disorder) stress management  9. Benign skin lesion of forehead Do not pick or scratch at area - Ambulatory referral to Dermatology    Labs pending Health maintenance reviewed Diet and exercise encouraged Continue all meds Follow up  In 3 months   [place], FNP

## 2017-03-05 NOTE — Patient Instructions (Signed)

## 2017-03-06 LAB — LIPID PANEL
Chol/HDL Ratio: 4.5 ratio — ABNORMAL HIGH (ref 0.0–4.4)
Cholesterol, Total: 165 mg/dL (ref 100–199)
HDL: 37 mg/dL — AB (ref >39)
LDL Calculated: 86 mg/dL (ref 0–99)
TRIGLYCERIDES: 210 mg/dL — AB (ref 0–149)
VLDL CHOLESTEROL CAL: 42 mg/dL — AB (ref 5–40)

## 2017-03-06 LAB — CMP14+EGFR
A/G RATIO: 1.6 (ref 1.2–2.2)
ALT: 13 IU/L (ref 0–32)
AST: 12 IU/L (ref 0–40)
Albumin: 4 g/dL (ref 3.5–4.8)
Alkaline Phosphatase: 93 IU/L (ref 39–117)
BUN/Creatinine Ratio: 13 (ref 12–28)
BUN: 12 mg/dL (ref 8–27)
Bilirubin Total: 0.4 mg/dL (ref 0.0–1.2)
CALCIUM: 9 mg/dL (ref 8.7–10.3)
CHLORIDE: 102 mmol/L (ref 96–106)
CO2: 27 mmol/L (ref 18–29)
Creatinine, Ser: 0.93 mg/dL (ref 0.57–1.00)
GFR calc Af Amer: 69 mL/min/{1.73_m2} (ref >59)
GFR, EST NON AFRICAN AMERICAN: 60 mL/min/{1.73_m2} (ref >59)
GLUCOSE: 248 mg/dL — AB (ref 65–99)
Globulin, Total: 2.5 g/dL (ref 1.5–4.5)
POTASSIUM: 4.4 mmol/L (ref 3.5–5.2)
Sodium: 142 mmol/L (ref 134–144)
Total Protein: 6.5 g/dL (ref 6.0–8.5)

## 2017-03-23 ENCOUNTER — Other Ambulatory Visit: Payer: Self-pay | Admitting: Dermatology

## 2017-03-23 DIAGNOSIS — D492 Neoplasm of unspecified behavior of bone, soft tissue, and skin: Secondary | ICD-10-CM | POA: Diagnosis not present

## 2017-03-23 DIAGNOSIS — L570 Actinic keratosis: Secondary | ICD-10-CM | POA: Diagnosis not present

## 2017-03-23 DIAGNOSIS — C44311 Basal cell carcinoma of skin of nose: Secondary | ICD-10-CM | POA: Diagnosis not present

## 2017-03-23 DIAGNOSIS — B079 Viral wart, unspecified: Secondary | ICD-10-CM | POA: Diagnosis not present

## 2017-03-23 DIAGNOSIS — C4491 Basal cell carcinoma of skin, unspecified: Secondary | ICD-10-CM

## 2017-03-23 DIAGNOSIS — D229 Melanocytic nevi, unspecified: Secondary | ICD-10-CM | POA: Diagnosis not present

## 2017-03-23 HISTORY — DX: Basal cell carcinoma of skin, unspecified: C44.91

## 2017-03-26 DIAGNOSIS — H401221 Low-tension glaucoma, left eye, mild stage: Secondary | ICD-10-CM | POA: Diagnosis not present

## 2017-03-26 DIAGNOSIS — H401212 Low-tension glaucoma, right eye, moderate stage: Secondary | ICD-10-CM | POA: Diagnosis not present

## 2017-03-26 DIAGNOSIS — H1851 Endothelial corneal dystrophy: Secondary | ICD-10-CM | POA: Diagnosis not present

## 2017-03-26 DIAGNOSIS — L97511 Non-pressure chronic ulcer of other part of right foot limited to breakdown of skin: Secondary | ICD-10-CM | POA: Diagnosis not present

## 2017-04-16 DIAGNOSIS — C44311 Basal cell carcinoma of skin of nose: Secondary | ICD-10-CM | POA: Diagnosis not present

## 2017-04-30 DIAGNOSIS — L97511 Non-pressure chronic ulcer of other part of right foot limited to breakdown of skin: Secondary | ICD-10-CM | POA: Diagnosis not present

## 2017-05-28 DIAGNOSIS — L97511 Non-pressure chronic ulcer of other part of right foot limited to breakdown of skin: Secondary | ICD-10-CM | POA: Diagnosis not present

## 2017-06-16 ENCOUNTER — Encounter: Payer: Self-pay | Admitting: Nurse Practitioner

## 2017-06-16 ENCOUNTER — Ambulatory Visit (INDEPENDENT_AMBULATORY_CARE_PROVIDER_SITE_OTHER): Payer: Medicare Other | Admitting: Nurse Practitioner

## 2017-06-16 VITALS — BP 127/71 | HR 86 | Temp 97.4°F | Ht 65.0 in | Wt 166.0 lb

## 2017-06-16 DIAGNOSIS — E785 Hyperlipidemia, unspecified: Secondary | ICD-10-CM | POA: Diagnosis not present

## 2017-06-16 DIAGNOSIS — N183 Chronic kidney disease, stage 3 unspecified: Secondary | ICD-10-CM

## 2017-06-16 DIAGNOSIS — I10 Essential (primary) hypertension: Secondary | ICD-10-CM | POA: Diagnosis not present

## 2017-06-16 DIAGNOSIS — C541 Malignant neoplasm of endometrium: Secondary | ICD-10-CM | POA: Diagnosis not present

## 2017-06-16 DIAGNOSIS — E1142 Type 2 diabetes mellitus with diabetic polyneuropathy: Secondary | ICD-10-CM | POA: Diagnosis not present

## 2017-06-16 DIAGNOSIS — K591 Functional diarrhea: Secondary | ICD-10-CM | POA: Diagnosis not present

## 2017-06-16 DIAGNOSIS — K219 Gastro-esophageal reflux disease without esophagitis: Secondary | ICD-10-CM | POA: Diagnosis not present

## 2017-06-16 DIAGNOSIS — Z6828 Body mass index (BMI) 28.0-28.9, adult: Secondary | ICD-10-CM | POA: Diagnosis not present

## 2017-06-16 DIAGNOSIS — M810 Age-related osteoporosis without current pathological fracture: Secondary | ICD-10-CM | POA: Diagnosis not present

## 2017-06-16 DIAGNOSIS — F411 Generalized anxiety disorder: Secondary | ICD-10-CM | POA: Diagnosis not present

## 2017-06-16 LAB — BAYER DCA HB A1C WAIVED: HB A1C (BAYER DCA - WAIVED): 8.2 % — ABNORMAL HIGH (ref <7.0)

## 2017-06-16 MED ORDER — GLIPIZIDE ER 10 MG PO TB24: 10.0000 mg | ORAL_TABLET | Freq: Every day | ORAL | 1 refills | Status: DC

## 2017-06-16 MED ORDER — LINAGLIPTIN 5 MG PO TABS: 5.0000 mg | ORAL_TABLET | Freq: Every day | ORAL | 1 refills | Status: DC

## 2017-06-16 MED ORDER — DULAGLUTIDE 0.75 MG/0.5ML ~~LOC~~ SOAJ: 0.7500 mg | SUBCUTANEOUS | 5 refills | Status: DC

## 2017-06-16 MED ORDER — GABAPENTIN 100 MG PO CAPS: 100.0000 mg | ORAL_CAPSULE | Freq: Every day | ORAL | 1 refills | Status: DC

## 2017-06-16 MED ORDER — DEXLANSOPRAZOLE 60 MG PO CPDR: 1.0000 | DELAYED_RELEASE_CAPSULE | Freq: Every day | ORAL | 1 refills | Status: DC

## 2017-06-16 NOTE — Patient Instructions (Signed)

## 2017-06-16 NOTE — Progress Notes (Signed)
Subjective:    Patient ID: Catherine Cooper, female    DOB: 08-02-1941, 76 y.o.   MRN: [id number]  HPI Catherine Cooper is here today for follow up of chronic medical problem.  Outpatient Encounter Prescriptions as of 06/16/2017  Medication Sig  . ALPHAGAN P 0.1 % SOLN Place 1 drop into both eyes 2 (two) times daily.   Marland Kitchen aspirin 81 MG tablet Take 81 mg by mouth every evening.   . bifidobacterium infantis (ALIGN) capsule Take 1 capsule by mouth daily.  . calcium citrate-vitamin D (CITRACAL+D) 315-200 MG-UNIT tablet Take 1 tablet by mouth 2 (two) times daily.  . Cholecalciferol (VITAMIN D3) 2000 UNITS TABS Take 1 capsule by mouth daily.   . cholestyramine (QUESTRAN) 4 G packet Take 4 g by mouth daily.   Marland Kitchen dexlansoprazole (DEXILANT) 60 MG capsule Take 1 capsule (60 mg total) by mouth daily.  Marland Kitchen gabapentin (NEURONTIN) 100 MG capsule Take 1 capsule (100 mg total) by mouth daily.  Marland Kitchen glipiZIDE (GLIPIZIDE XL) 10 MG 24 hr tablet Take 1 tablet (10 mg total) by mouth daily with breakfast.  . ibuprofen (ADVIL,MOTRIN) 800 MG tablet Take 1 tablet (800 mg total) by mouth every 6 (six) hours as needed for pain.  Marland Kitchen latanoprost (XALATAN) 0.005 % ophthalmic solution Place 1 drop into both eyes at bedtime.   Marland Kitchen linagliptin (TRADJENTA) 5 MG TABS tablet Take 1 tablet (5 mg total) by mouth at bedtime.  Marland Kitchen LORazepam (ATIVAN) 0.5 MG tablet Take 1 tablet (0.5 mg total) by mouth 2 (two) times daily as needed for anxiety or sleep.  . Multiple Vitamins-Minerals (PRESERVISION/LUTEIN PO) Take by mouth.  . ONE TOUCH ULTRA TEST test strip Test BS once daily and PRN dx.E11.9  . vitamin B-12 (CYANOCOBALAMIN) 1000 MCG tablet Take 1,000 mcg by mouth daily.     No facility-administered encounter medications on file as of 06/16/2017.     1. Essential hypertension  No c/o chest pain,SOB or headache. Does not check blood presure at home  2. Gastroesophageal reflux disease without esophagitis  ecxilant is the only medicine that will  keep symptoms under control  3. Type 2 diabetes mellitus with diabetic polyneuropathy, without long-term current use of insulin (HCC) last HGBA1C was 7.4%. No changes were made to meds at that time. She has sen no real change to blood sugars. No hypoglycemia.  4. Age-related osteoporosis without current pathological fracture  Does no weight bearing exercise  5. Endometrial cancer ([place])  Has ben treated and is having no problems. denies any bleeding  6. Chronic kidney disease, stage III (moderate)  We are currently just watching lab work  7. Hyperlipidemia, unspecified hyperlipidemia type  Doe sot really watch diet very well  8. GAD (generalized anxiety disorder)  Stress level has decreased since she is no longer having to take care of her mothe rin law  9. Functional diarrhea  Still has on occasion but is n=much better since she stopped taking metformin  10. BMI 28.0-28.9,adult  No recent weight changes.    New complaints: None today  Social history: Her mother in law died several month sago   Review of Systems  Constitutional: Negative for activity change and appetite change.  HENT: Negative.   Eyes: Negative for pain.  Respiratory: Negative for shortness of breath.   Cardiovascular: Negative for chest pain, palpitations and leg swelling.  Gastrointestinal: Negative for abdominal pain.  Endocrine: Negative for polydipsia.  Genitourinary: Negative.   Skin: Negative for rash.  Neurological:  Negative for dizziness, weakness and headaches.  Hematological: Does not bruise/bleed easily.  Psychiatric/Behavioral: Negative.   All other systems reviewed and are negative.      Objective:   Physical Exam  Constitutional: She is oriented to person, place, and time. She appears well-developed and well-nourished.  HENT:  Nose: Nose normal.  Mouth/Throat: Oropharynx is clear and moist.  Eyes: EOM are normal.  Neck: Trachea normal, normal range of motion and full passive range of  motion without pain. Neck supple. No JVD present. Carotid bruit is not present. No thyromegaly present.  Cardiovascular: Normal rate, regular rhythm, normal heart sounds and intact distal pulses.  Exam reveals no gallop and no friction rub.   No murmur heard. Pulmonary/Chest: Effort normal and breath sounds normal.  Abdominal: Soft. Bowel sounds are normal. She exhibits no distension and no mass. There is no tenderness.  Musculoskeletal: Normal range of motion.  Lymphadenopathy:    She has no cervical adenopathy.  Neurological: She is alert and oriented to person, place, and time. She has normal reflexes.  Skin: Skin is warm and dry.  Psychiatric: She has a normal mood and affect. Her behavior is normal. Judgment and thought content normal.   BP 127/71   Pulse 86   Temp (!) 97.4 F (36.3 C) (Oral)   Ht 5' 5"  (1.651 m)   Wt 166 lb (75.3 kg)   BMI 27.62 kg/m   HGBA1c 8.2%      Assessment & Plan:  1. Essential hypertension Low sodium diet - CMP14+EGFR  2. Gastroesophageal reflux disease without esophagitis Avoid spicy foods Do not eat 2 hours prior to bedtime - dexlansoprazole (DEXILANT) 60 MG capsule; Take 1 capsule (60 mg total) by mouth daily.  Dispense: 90 capsule; Refill: 1  3. Type 2 diabetes mellitus with diabetic polyneuropathy, without long-term current use of insulin (HCC) Added trulicity weekly- patient gave first shot in office - Bayer DCA Hb A1c Waived - linagliptin (TRADJENTA) 5 MG TABS tablet; Take 1 tablet (5 mg total) by mouth at bedtime.  Dispense: 90 tablet; Refill: 1 - gabapentin (NEURONTIN) 100 MG capsule; Take 1 capsule (100 mg total) by mouth daily.  Dispense: 90 capsule; Refill: 1 - glipiZIDE (GLIPIZIDE XL) 10 MG 24 hr tablet; Take 1 tablet (10 mg total) by mouth daily with breakfast.  Dispense: 90 tablet; Refill: 1 - Dulaglutide (TRULICITY) 0.93 AT/5.5DD SOPN; Inject 0.75 mg into the skin once a week.  Dispense: 4 pen; Refill: 5  4. Age-related  osteoporosis without current pathological fracture Weight bearing exercises  5. Endometrial cancer ([place])  6. Chronic kidney disease, stage III (moderate) Labs pending  7. Hyperlipidemia, unspecified hyperlipidemia type Low fat diet - Lipid panel  8. GAD (generalized anxiety disorder) Ativan as needed  9. Functional diarrhea  10. BMI 28.0-28.9,adult Discussed diet and exercise for person with BMI >25 Will recheck weight in 3-6 months     Labs pending Health maintenance reviewed Diet and exercise encouraged Continue all meds Follow up  In 3 months   [place], FNP

## 2017-06-17 LAB — CMP14+EGFR
A/G RATIO: 1.4 (ref 1.2–2.2)
ALBUMIN: 4 g/dL (ref 3.5–4.8)
ALT: 18 IU/L (ref 0–32)
AST: 19 IU/L (ref 0–40)
Alkaline Phosphatase: 104 IU/L (ref 39–117)
BILIRUBIN TOTAL: 0.6 mg/dL (ref 0.0–1.2)
BUN / CREAT RATIO: 13 (ref 12–28)
BUN: 14 mg/dL (ref 8–27)
CALCIUM: 9.1 mg/dL (ref 8.7–10.3)
CO2: 26 mmol/L (ref 20–29)
Chloride: 102 mmol/L (ref 96–106)
Creatinine, Ser: 1.1 mg/dL — ABNORMAL HIGH (ref 0.57–1.00)
GFR calc Af Amer: 56 mL/min/{1.73_m2} — ABNORMAL LOW (ref >59)
GFR, EST NON AFRICAN AMERICAN: 49 mL/min/{1.73_m2} — AB (ref >59)
GLUCOSE: 181 mg/dL — AB (ref 65–99)
Globulin, Total: 2.8 g/dL (ref 1.5–4.5)
Potassium: 4.5 mmol/L (ref 3.5–5.2)
Sodium: 143 mmol/L (ref 134–144)
Total Protein: 6.8 g/dL (ref 6.0–8.5)

## 2017-06-17 LAB — LIPID PANEL
CHOL/HDL RATIO: 3.6 ratio (ref 0.0–4.4)
Cholesterol, Total: 162 mg/dL (ref 100–199)
HDL: 45 mg/dL (ref >39)
LDL CALC: 68 mg/dL (ref 0–99)
Triglycerides: 244 mg/dL — ABNORMAL HIGH (ref 0–149)
VLDL CHOLESTEROL CAL: 49 mg/dL — AB (ref 5–40)

## 2017-06-19 ENCOUNTER — Other Ambulatory Visit: Payer: Self-pay

## 2017-06-19 ENCOUNTER — Telehealth: Payer: Self-pay | Admitting: Nurse Practitioner

## 2017-06-19 DIAGNOSIS — E1142 Type 2 diabetes mellitus with diabetic polyneuropathy: Secondary | ICD-10-CM

## 2017-06-19 DIAGNOSIS — K219 Gastro-esophageal reflux disease without esophagitis: Secondary | ICD-10-CM

## 2017-06-19 MED ORDER — DULAGLUTIDE 0.75 MG/0.5ML ~~LOC~~ SOAJ: 0.7500 mg | SUBCUTANEOUS | 5 refills | Status: DC

## 2017-06-19 MED ORDER — DEXLANSOPRAZOLE 60 MG PO CPDR: 1.0000 | DELAYED_RELEASE_CAPSULE | Freq: Every day | ORAL | 1 refills | Status: DC

## 2017-06-19 MED ORDER — GABAPENTIN 100 MG PO CAPS: 100.0000 mg | ORAL_CAPSULE | Freq: Every day | ORAL | 1 refills | Status: DC

## 2017-06-19 NOTE — Telephone Encounter (Signed)
Rxs cancelled at [facility] and sent to [facility]. Patient notified

## 2017-06-25 DIAGNOSIS — L97511 Non-pressure chronic ulcer of other part of right foot limited to breakdown of skin: Secondary | ICD-10-CM | POA: Diagnosis not present

## 2017-07-09 DIAGNOSIS — L97511 Non-pressure chronic ulcer of other part of right foot limited to breakdown of skin: Secondary | ICD-10-CM | POA: Diagnosis not present

## 2017-07-14 ENCOUNTER — Ambulatory Visit (INDEPENDENT_AMBULATORY_CARE_PROVIDER_SITE_OTHER): Payer: Medicare Other

## 2017-07-14 DIAGNOSIS — Z23 Encounter for immunization: Secondary | ICD-10-CM | POA: Diagnosis not present

## 2017-07-16 DIAGNOSIS — H401212 Low-tension glaucoma, right eye, moderate stage: Secondary | ICD-10-CM | POA: Diagnosis not present

## 2017-07-16 DIAGNOSIS — H401221 Low-tension glaucoma, left eye, mild stage: Secondary | ICD-10-CM | POA: Diagnosis not present

## 2017-07-16 DIAGNOSIS — H353132 Nonexudative age-related macular degeneration, bilateral, intermediate dry stage: Secondary | ICD-10-CM | POA: Diagnosis not present

## 2017-07-16 DIAGNOSIS — E113511 Type 2 diabetes mellitus with proliferative diabetic retinopathy with macular edema, right eye: Secondary | ICD-10-CM | POA: Diagnosis not present

## 2017-07-16 LAB — HM DIABETES EYE EXAM

## 2017-07-30 DIAGNOSIS — L97511 Non-pressure chronic ulcer of other part of right foot limited to breakdown of skin: Secondary | ICD-10-CM | POA: Diagnosis not present

## 2017-08-18 DIAGNOSIS — L97511 Non-pressure chronic ulcer of other part of right foot limited to breakdown of skin: Secondary | ICD-10-CM | POA: Diagnosis not present

## 2017-09-10 DIAGNOSIS — L97511 Non-pressure chronic ulcer of other part of right foot limited to breakdown of skin: Secondary | ICD-10-CM | POA: Diagnosis not present

## 2017-09-17 ENCOUNTER — Encounter: Payer: Self-pay | Admitting: Nurse Practitioner

## 2017-09-17 ENCOUNTER — Ambulatory Visit (INDEPENDENT_AMBULATORY_CARE_PROVIDER_SITE_OTHER): Payer: Medicare Other | Admitting: Nurse Practitioner

## 2017-09-17 VITALS — BP 149/75 | HR 84 | Temp 96.9°F | Ht 65.0 in | Wt 161.0 lb

## 2017-09-17 DIAGNOSIS — E1142 Type 2 diabetes mellitus with diabetic polyneuropathy: Secondary | ICD-10-CM | POA: Diagnosis not present

## 2017-09-17 DIAGNOSIS — F411 Generalized anxiety disorder: Secondary | ICD-10-CM

## 2017-09-17 DIAGNOSIS — E785 Hyperlipidemia, unspecified: Secondary | ICD-10-CM

## 2017-09-17 DIAGNOSIS — Z6828 Body mass index (BMI) 28.0-28.9, adult: Secondary | ICD-10-CM

## 2017-09-17 DIAGNOSIS — C541 Malignant neoplasm of endometrium: Secondary | ICD-10-CM

## 2017-09-17 DIAGNOSIS — N183 Chronic kidney disease, stage 3 unspecified: Secondary | ICD-10-CM

## 2017-09-17 DIAGNOSIS — K219 Gastro-esophageal reflux disease without esophagitis: Secondary | ICD-10-CM

## 2017-09-17 DIAGNOSIS — M810 Age-related osteoporosis without current pathological fracture: Secondary | ICD-10-CM

## 2017-09-17 DIAGNOSIS — H401132 Primary open-angle glaucoma, bilateral, moderate stage: Secondary | ICD-10-CM | POA: Diagnosis not present

## 2017-09-17 DIAGNOSIS — K591 Functional diarrhea: Secondary | ICD-10-CM | POA: Diagnosis not present

## 2017-09-17 DIAGNOSIS — I10 Essential (primary) hypertension: Secondary | ICD-10-CM

## 2017-09-17 LAB — BAYER DCA HB A1C WAIVED: HB A1C: 6.6 % (ref <7.0)

## 2017-09-17 MED ORDER — ONETOUCH ULTRA BLUE VI STRP: ORAL_STRIP | 11 refills | Status: DC

## 2017-09-17 MED ORDER — LINAGLIPTIN 5 MG PO TABS: 5.0000 mg | ORAL_TABLET | Freq: Every day | ORAL | 1 refills | Status: DC

## 2017-09-17 MED ORDER — GLIPIZIDE ER 10 MG PO TB24: 10.0000 mg | ORAL_TABLET | Freq: Every day | ORAL | 1 refills | Status: DC

## 2017-09-17 NOTE — Progress Notes (Signed)
Subjective:    Patient ID: Catherine Cooper, female    DOB: Jul 13, 1941, 76 y.o.   MRN: [id number]  HPI  Catherine Cooper is here today for follow up of chronic medical problem.  Outpatient Encounter Medications as of 09/17/2017  Medication Sig  . ALPHAGAN P 0.1 % SOLN Place 1 drop into both eyes 2 (two) times daily.   Marland Kitchen aspirin 81 MG tablet Take 81 mg by mouth every evening.   . bifidobacterium infantis (ALIGN) capsule Take 1 capsule by mouth daily.  . calcium citrate-vitamin D (CITRACAL+D) 315-200 MG-UNIT tablet Take 1 tablet by mouth 2 (two) times daily.  . Cholecalciferol (VITAMIN D3) 2000 UNITS TABS Take 1 capsule by mouth daily.   . cholestyramine (QUESTRAN) 4 G packet Take 4 g by mouth daily.   Marland Kitchen dexlansoprazole (DEXILANT) 60 MG capsule Take 1 capsule (60 mg total) by mouth daily.  . Dulaglutide (TRULICITY) 5.40 GQ/6.7YP SOPN Inject 0.75 mg into the skin once a week.  . gabapentin (NEURONTIN) 100 MG capsule Take 1 capsule (100 mg total) by mouth daily.  Marland Kitchen glipiZIDE (GLIPIZIDE XL) 10 MG 24 hr tablet Take 1 tablet (10 mg total) by mouth daily with breakfast.  . ibuprofen (ADVIL,MOTRIN) 800 MG tablet Take 1 tablet (800 mg total) by mouth every 6 (six) hours as needed for pain.  Marland Kitchen latanoprost (XALATAN) 0.005 % ophthalmic solution Place 1 drop into both eyes at bedtime.   Marland Kitchen linagliptin (TRADJENTA) 5 MG TABS tablet Take 1 tablet (5 mg total) by mouth at bedtime.  Marland Kitchen LORazepam (ATIVAN) 0.5 MG tablet Take 1 tablet (0.5 mg total) by mouth 2 (two) times daily as needed for anxiety or sleep.  . Multiple Vitamins-Minerals (PRESERVISION/LUTEIN PO) Take by mouth.  . ONE TOUCH ULTRA TEST test strip Test BS once daily and PRN dx.E11.9  . vitamin B-12 (CYANOCOBALAMIN) 1000 MCG tablet Take 1,000 mcg by mouth daily.       1. Essential hypertension  No c/o chest pain, sob or headache. Does not check blood pressure at home  2. Type 2 diabetes mellitus with diabetic polyneuropathy, without long-term  current use of insulin (HCC) last hgba1c was 8.2%- we started heron tradjenta. Blood sugars are rnning in 130-180 fasting. No hypoglycemia. Insurance does not want o pay for tradgenta , but she cannot take Catherine Cooper beause it causes stomach problems.  3. Hyperlipidemia, unspecified hyperlipidemia type  Tries very hard to watch diet  4. Gastroesophageal reflux disease without esophagitis  Takes dexilant daily- she has cough if does not take  5. Age-related osteoporosis without current pathological fracture  Patient cannot do weight bearing exercises due to neuroathy on hr feet  6. Endometrial cancer ([place])  Has ben cleared from oncology  7. Chronic kidney disease, stage III (moderate) (HCC)  Currently just watching labs  8. Primary open angle glaucoma (POAG) of both eyes, moderate stage  Sees eye doctor every 6 months  9. GAD (generalized anxiety disorder)  Stress is better since passing of her mother in law  70. BMI 28.0-28.9,adult  No weight changes  11. Functional diarrhea  Still has daily but me shelp    New complaints: None today  Social history: Husband doing well since his mom passed- less stress on famiily not having to care for her so much.   Review of Systems  Constitutional: Negative for activity change and appetite change.  HENT: Negative.   Eyes: Negative for pain.  Respiratory: Negative for shortness of breath.   Cardiovascular:  Negative for chest pain, palpitations and leg swelling.  Gastrointestinal: Positive for diarrhea. Negative for abdominal pain.  Endocrine: Negative for polydipsia.  Genitourinary: Negative.   Musculoskeletal: Positive for arthralgias.  Skin: Negative for rash.  Neurological: Negative for dizziness, weakness and headaches.  Hematological: Does not bruise/bleed easily.  Psychiatric/Behavioral: Negative.   All other systems reviewed and are negative.      Objective:   Physical Exam  Constitutional: She is oriented to person, place, and  time. She appears well-developed and well-nourished.  HENT:  Nose: Nose normal.  Mouth/Throat: Oropharynx is clear and moist.  Eyes: EOM are normal.  Neck: Trachea normal, normal range of motion and full passive range of motion without pain. Neck supple. No JVD present. Carotid bruit is not present. No thyromegaly present.  Cardiovascular: Normal rate, regular rhythm, normal heart sounds and intact distal pulses. Exam reveals no gallop and no friction rub.  No murmur heard. Pulmonary/Chest: Effort normal and breath sounds normal.  Abdominal: Soft. Bowel sounds are normal. She exhibits no distension and no mass. There is no tenderness.  Musculoskeletal: Normal range of motion.  Lymphadenopathy:    She has no cervical adenopathy.  Neurological: She is alert and oriented to person, place, and time. She has normal reflexes.  Skin: Skin is warm and dry.  Psychiatric: She has a normal mood and affect. Her behavior is normal. Judgment and thought content normal.   BP (!) 149/75   Pulse 84   Temp (!) 96.9 F (36.1 C) (Oral)   Ht _0  (1.651 m)   Wt 161 lb (73 kg)   BMI 26.79 kg/m   hgba1c 6.6%     Assessment & Plan:  1. Essential hypertension Low sodium diet - CMP14+EGFR  2. Type 2 diabetes mellitus with diabetic polyneuropathy, without long-term current use of insulin (HCC) Continue o watch carbs in diet - Bayer DCA Hb A1c Waived - Microalbumin / creatinine urine ratio  3. Hyperlipidemia, unspecified hyperlipidemia type Low fat diet - Lipid panel  4. Gastroesophageal reflux disease without esophagitis First 24 Hours-Clear liquids  popsicles  Jello  gatorade  Sprite Second 24 hours-Add Full liquids ( Liquids you cant see through) Third 24 hours- Bland diet ( foods that are baked or broiled)  *avoiding fried foods and highly spiced foods* During these 3 days  Avoid milk, cheese, ice cream or any other dairy products  Avoid caffeine- REMEMBER Mt. Dew and Mello Yellow  contain lots of caffeine You should eat and drink in  Frequent small volumes If no improvement in symptoms or worsen in 2-3 days should RETRUN TO OFFICE or go to ER!      5. Age-related osteoporosis without current pathological fracture  6. Endometrial cancer ([place])  7. Chronic kidney disease, stage III (moderate) (HCC) Labs pending  8. Primary open angle glaucoma (POAG) of both eyes, moderate stage Keep follow up appointmnet with eye doctor  9. GAD (generalized anxiety disorder) Stress management reviewed  10. BMI 28.0-28.9,adult Discussed diet and exercise for person with BMI >25 Will recheck weight in 3-6 months  11. Functional diarrhea     Labs pending Health maintenance reviewed Diet and exercise encouraged Continue all meds Follow up  In 3 months   [place], FNP

## 2017-09-17 NOTE — Patient Instructions (Signed)
Stress and Stress Management Stress is a normal reaction to life events. It is what you feel when life demands more than you are used to or more than you can handle. Some stress can be useful. For example, the stress reaction can help you catch the last bus of the day, study for a test, or meet a deadline at work. But stress that occurs too often or for too long can cause problems. It can affect your emotional health and interfere with relationships and normal daily activities. Too much stress can weaken your immune system and increase your risk for physical illness. If you already have a medical problem, stress can make it worse. What are the causes? All sorts of life events may cause stress. An event that causes stress for one person may not be stressful for another person. Major life events commonly cause stress. These may be positive or negative. Examples include losing your job, moving into a new home, getting married, having a baby, or losing a loved one. Less obvious life events may also cause stress, especially if they occur day after day or in combination. Examples include working long hours, driving in traffic, caring for children, being in debt, or being in a difficult relationship. What are the signs or symptoms? Stress may cause emotional symptoms including, the following:  Anxiety. This is feeling worried, afraid, on edge, overwhelmed, or out of control.  Anger. This is feeling irritated or impatient.  Depression. This is feeling sad, down, helpless, or guilty.  Difficulty focusing, remembering, or making decisions.  Stress may cause physical symptoms, including the following:  Aches and pains. These may affect your head, neck, back, stomach, or other areas of your body.  Tight muscles or clenched jaw.  Low energy or trouble sleeping.  Stress may cause unhealthy behaviors, including the following:  Eating to feel better (overeating) or skipping meals.  Sleeping too little,  too much, or both.  Working too much or putting off tasks (procrastination).  Smoking, drinking alcohol, or using drugs to feel better.  How is this diagnosed? Stress is diagnosed through an assessment by your health care provider. Your health care provider will ask questions about your symptoms and any stressful life events.Your health care provider will also ask about your medical history and may order blood tests or other tests. Certain medical conditions and medicine can cause physical symptoms similar to stress. Mental illness can cause emotional symptoms and unhealthy behaviors similar to stress. Your health care provider may refer you to a mental health professional for further evaluation. How is this treated? Stress management is the recommended treatment for stress.The goals of stress management are reducing stressful life events and coping with stress in healthy ways. Techniques for reducing stressful life events include the following:  Stress identification. Self-monitor for stress and identify what causes stress for you. These skills may help you to avoid some stressful events.  Time management. Set your priorities, keep a calendar of events, and learn to say "no." These tools can help you avoid making too many commitments.  Techniques for coping with stress include the following:  Rethinking the problem. Try to think realistically about stressful events rather than ignoring them or overreacting. Try to find the positives in a stressful situation rather than focusing on the negatives.  Exercise. Physical exercise can release both physical and emotional tension. The key is to find a form of exercise you enjoy and do it regularly.  Relaxation techniques. These relax the body and  mind. Examples include yoga, meditation, tai chi, biofeedback, deep breathing, progressive muscle relaxation, listening to music, being out in nature, journaling, and other hobbies. Again, the key is to find  one or more that you enjoy and can do regularly.  Healthy lifestyle. Eat a balanced diet, get plenty of sleep, and do not smoke. Avoid using alcohol or drugs to relax.  Strong support network. Spend time with family, friends, or other people you enjoy being around.Express your feelings and talk things over with someone you trust.  Counseling or talktherapy with a mental health professional may be helpful if you are having difficulty managing stress on your own. Medicine is typically not recommended for the treatment of stress.Talk to your health care provider if you think you need medicine for symptoms of stress. Follow these instructions at home:  Keep all follow-up visits as directed by your health care provider.  Take all medicines as directed by your health care provider. Contact a health care provider if:  Your symptoms get worse or you start having new symptoms.  You feel overwhelmed by your problems and can no longer manage them on your own. Get help right away if:  You feel like hurting yourself or someone else. This information is not intended to replace advice given to you by your health care provider. Make sure you discuss any questions you have with your health care provider. Document Released: 03/25/2001 Document Revised: 03/06/2016 Document Reviewed: 05/24/2013 Elsevier Interactive Patient Education  2017 [employer]  

## 2017-09-18 LAB — LIPID PANEL
CHOL/HDL RATIO: 3.7 ratio (ref 0.0–4.4)
Cholesterol, Total: 148 mg/dL (ref 100–199)
HDL: 40 mg/dL (ref >39)
LDL Calculated: 74 mg/dL (ref 0–99)
TRIGLYCERIDES: 168 mg/dL — AB (ref 0–149)
VLDL Cholesterol Cal: 34 mg/dL (ref 5–40)

## 2017-09-18 LAB — CMP14+EGFR
ALT: 17 IU/L (ref 0–32)
AST: 16 IU/L (ref 0–40)
Albumin/Globulin Ratio: 1.6 (ref 1.2–2.2)
Albumin: 4.1 g/dL (ref 3.5–4.8)
Alkaline Phosphatase: 77 IU/L (ref 39–117)
BUN / CREAT RATIO: 15 (ref 12–28)
BUN: 13 mg/dL (ref 8–27)
Bilirubin Total: 0.5 mg/dL (ref 0.0–1.2)
CALCIUM: 9.3 mg/dL (ref 8.7–10.3)
CO2: 27 mmol/L (ref 20–29)
Chloride: 104 mmol/L (ref 96–106)
Creatinine, Ser: 0.89 mg/dL (ref 0.57–1.00)
GFR calc Af Amer: 72 mL/min/{1.73_m2} (ref >59)
GFR, EST NON AFRICAN AMERICAN: 63 mL/min/{1.73_m2} (ref >59)
GLOBULIN, TOTAL: 2.5 g/dL (ref 1.5–4.5)
Glucose: 125 mg/dL — ABNORMAL HIGH (ref 65–99)
Potassium: 4.4 mmol/L (ref 3.5–5.2)
SODIUM: 144 mmol/L (ref 134–144)
Total Protein: 6.6 g/dL (ref 6.0–8.5)

## 2017-09-18 LAB — MICROALBUMIN / CREATININE URINE RATIO
Creatinine, Urine: 76.4 mg/dL
MICROALB/CREAT RATIO: 21.5 mg/g{creat} (ref 0.0–30.0)
MICROALBUM., U, RANDOM: 16.4 ug/mL

## 2017-09-24 DIAGNOSIS — L97511 Non-pressure chronic ulcer of other part of right foot limited to breakdown of skin: Secondary | ICD-10-CM | POA: Diagnosis not present

## 2017-10-15 ENCOUNTER — Telehealth: Payer: Self-pay | Admitting: Nurse Practitioner

## 2017-10-15 DIAGNOSIS — L97511 Non-pressure chronic ulcer of other part of right foot limited to breakdown of skin: Secondary | ICD-10-CM | POA: Diagnosis not present

## 2017-10-15 NOTE — Telephone Encounter (Signed)
Do you know anything about this? 

## 2017-10-16 NOTE — Telephone Encounter (Signed)
no

## 2017-10-16 NOTE — Telephone Encounter (Signed)
Pt states that she needs a letter to be sent to insurance to cover tradgenta because she cannot take [place]. The letter did not state that she needed a pa.

## 2017-10-16 NOTE — Telephone Encounter (Signed)
Left message for patient to call . Does she have a letter stating she will need prior authorization to continue getting tradgenta?

## 2017-10-17 NOTE — Telephone Encounter (Signed)
Catherine Cooper can you do prior auth for this.

## 2017-10-18 ENCOUNTER — Other Ambulatory Visit: Payer: Self-pay | Admitting: Nurse Practitioner

## 2017-10-18 DIAGNOSIS — E1142 Type 2 diabetes mellitus with diabetic polyneuropathy: Secondary | ICD-10-CM

## 2017-10-19 NOTE — Telephone Encounter (Signed)
I will try to do this  I have not received anything from pharmacy or insurance

## 2017-11-12 DIAGNOSIS — L97511 Non-pressure chronic ulcer of other part of right foot limited to breakdown of skin: Secondary | ICD-10-CM | POA: Diagnosis not present

## 2017-12-08 DIAGNOSIS — B0233 Zoster keratitis: Secondary | ICD-10-CM | POA: Diagnosis not present

## 2017-12-11 DIAGNOSIS — B0230 Zoster ocular disease, unspecified: Secondary | ICD-10-CM | POA: Diagnosis not present

## 2017-12-11 DIAGNOSIS — B0232 Zoster iridocyclitis: Secondary | ICD-10-CM | POA: Diagnosis not present

## 2017-12-11 DIAGNOSIS — B0233 Zoster keratitis: Secondary | ICD-10-CM | POA: Diagnosis not present

## 2017-12-17 DIAGNOSIS — B0233 Zoster keratitis: Secondary | ICD-10-CM | POA: Diagnosis not present

## 2017-12-17 DIAGNOSIS — B0230 Zoster ocular disease, unspecified: Secondary | ICD-10-CM | POA: Diagnosis not present

## 2017-12-17 DIAGNOSIS — B0232 Zoster iridocyclitis: Secondary | ICD-10-CM | POA: Diagnosis not present

## 2017-12-23 ENCOUNTER — Telehealth: Payer: Self-pay | Admitting: Nurse Practitioner

## 2017-12-23 NOTE — Telephone Encounter (Signed)
Samples ready for pick up

## 2017-12-24 DIAGNOSIS — B351 Tinea unguium: Secondary | ICD-10-CM | POA: Diagnosis not present

## 2017-12-24 DIAGNOSIS — E1142 Type 2 diabetes mellitus with diabetic polyneuropathy: Secondary | ICD-10-CM | POA: Diagnosis not present

## 2017-12-24 DIAGNOSIS — B0230 Zoster ocular disease, unspecified: Secondary | ICD-10-CM | POA: Diagnosis not present

## 2017-12-24 DIAGNOSIS — M79676 Pain in unspecified toe(s): Secondary | ICD-10-CM | POA: Diagnosis not present

## 2017-12-24 DIAGNOSIS — L84 Corns and callosities: Secondary | ICD-10-CM | POA: Diagnosis not present

## 2017-12-24 DIAGNOSIS — B0233 Zoster keratitis: Secondary | ICD-10-CM | POA: Diagnosis not present

## 2017-12-24 LAB — HM DIABETES EYE EXAM

## 2017-12-28 ENCOUNTER — Ambulatory Visit (INDEPENDENT_AMBULATORY_CARE_PROVIDER_SITE_OTHER): Payer: Medicare Other

## 2017-12-28 ENCOUNTER — Other Ambulatory Visit: Payer: Self-pay | Admitting: Nurse Practitioner

## 2017-12-28 ENCOUNTER — Encounter: Payer: Self-pay | Admitting: Nurse Practitioner

## 2017-12-28 ENCOUNTER — Ambulatory Visit (INDEPENDENT_AMBULATORY_CARE_PROVIDER_SITE_OTHER): Payer: Medicare Other | Admitting: Nurse Practitioner

## 2017-12-28 VITALS — BP 135/66 | HR 87 | Temp 96.7°F | Ht 65.0 in | Wt 162.4 lb

## 2017-12-28 DIAGNOSIS — Z780 Asymptomatic menopausal state: Secondary | ICD-10-CM | POA: Diagnosis not present

## 2017-12-28 DIAGNOSIS — E1142 Type 2 diabetes mellitus with diabetic polyneuropathy: Secondary | ICD-10-CM | POA: Diagnosis not present

## 2017-12-28 DIAGNOSIS — K219 Gastro-esophageal reflux disease without esophagitis: Secondary | ICD-10-CM | POA: Diagnosis not present

## 2017-12-28 DIAGNOSIS — C541 Malignant neoplasm of endometrium: Secondary | ICD-10-CM | POA: Diagnosis not present

## 2017-12-28 DIAGNOSIS — I10 Essential (primary) hypertension: Secondary | ICD-10-CM

## 2017-12-28 DIAGNOSIS — F411 Generalized anxiety disorder: Secondary | ICD-10-CM

## 2017-12-28 DIAGNOSIS — N183 Chronic kidney disease, stage 3 unspecified: Secondary | ICD-10-CM

## 2017-12-28 DIAGNOSIS — E785 Hyperlipidemia, unspecified: Secondary | ICD-10-CM

## 2017-12-28 DIAGNOSIS — K591 Functional diarrhea: Secondary | ICD-10-CM

## 2017-12-28 DIAGNOSIS — Z6828 Body mass index (BMI) 28.0-28.9, adult: Secondary | ICD-10-CM

## 2017-12-28 DIAGNOSIS — M810 Age-related osteoporosis without current pathological fracture: Secondary | ICD-10-CM | POA: Diagnosis not present

## 2017-12-28 LAB — BAYER DCA HB A1C WAIVED: HB A1C (BAYER DCA - WAIVED): 6.6 % (ref <7.0)

## 2017-12-28 MED ORDER — GABAPENTIN 100 MG PO CAPS: 100.0000 mg | ORAL_CAPSULE | Freq: Every day | ORAL | 1 refills | Status: DC

## 2017-12-28 MED ORDER — DULAGLUTIDE 0.75 MG/0.5ML ~~LOC~~ SOAJ: 0.7500 mg | SUBCUTANEOUS | 5 refills | Status: DC

## 2017-12-28 MED ORDER — DEXLANSOPRAZOLE 60 MG PO CPDR: 1.0000 | DELAYED_RELEASE_CAPSULE | Freq: Every day | ORAL | 1 refills | Status: DC

## 2017-12-28 MED ORDER — LORAZEPAM 0.5 MG PO TABS: 0.5000 mg | ORAL_TABLET | Freq: Two times a day (BID) | ORAL | 2 refills | Status: DC | PRN

## 2017-12-28 MED ORDER — ONETOUCH ULTRA BLUE VI STRP: ORAL_STRIP | 11 refills | Status: DC

## 2017-12-28 NOTE — Progress Notes (Signed)
Subjective:    Patient ID: Catherine Cooper, female    DOB: 03-16-1941, 77 y.o.   MRN: [id number]  HPI  SADIYA DURAND is here today for follow up of chronic medical problem.  Outpatient Encounter Medications as of 12/28/2017  Medication Sig  . ALPHAGAN P 0.1 % SOLN Place 1 drop into both eyes 2 (two) times daily.   Marland Kitchen aspirin 81 MG tablet Take 81 mg by mouth every evening.   . bifidobacterium infantis (ALIGN) capsule Take 1 capsule by mouth daily.  . calcium citrate-vitamin D (CITRACAL+D) 315-200 MG-UNIT tablet Take 1 tablet by mouth 2 (two) times daily.  . Cholecalciferol (VITAMIN D3) 2000 UNITS TABS Take 1 capsule by mouth daily.   . cholestyramine (QUESTRAN) 4 G packet Take 4 g by mouth daily.   Marland Kitchen dexlansoprazole (DEXILANT) 60 MG capsule Take 1 capsule (60 mg total) by mouth daily.  . Dulaglutide (TRULICITY) 4.19 FX/9.0WI SOPN Inject 0.75 mg into the skin once a week.  . gabapentin (NEURONTIN) 100 MG capsule Take 1 capsule (100 mg total) by mouth daily.  Marland Kitchen glipiZIDE (GLIPIZIDE XL) 10 MG 24 hr tablet Take 1 tablet (10 mg total) by mouth daily with breakfast.  . glipiZIDE (GLUCOTROL XL) 10 MG 24 hr tablet TAKE 1 TABLET DAILY WITH   BREAKFAST  . ibuprofen (ADVIL,MOTRIN) 800 MG tablet Take 1 tablet (800 mg total) by mouth every 6 (six) hours as needed for pain.  Marland Kitchen latanoprost (XALATAN) 0.005 % ophthalmic solution Place 1 drop into both eyes at bedtime.   Marland Kitchen linagliptin (TRADJENTA) 5 MG TABS tablet Take 1 tablet (5 mg total) by mouth at bedtime.  Marland Kitchen LORazepam (ATIVAN) 0.5 MG tablet Take 1 tablet (0.5 mg total) by mouth 2 (two) times daily as needed for anxiety or sleep.  . Multiple Vitamins-Minerals (PRESERVISION/LUTEIN PO) Take by mouth.  . ONE TOUCH ULTRA TEST test strip Test BS once daily and PRN dx.E11.9  . vitamin B-12 (CYANOCOBALAMIN) 1000 MCG tablet Take 1,000 mcg by mouth daily.       1. Essential hypertension  No c/o chest pain, sob or headace. Patient checks blood pressure every  day and systolic stays down below 130  2. Gastroesophageal reflux disease without esophagitis  Has bad reflux and has to take dexilant daily to prevent symptoms.  3. Type 2 diabetes mellitus with diabetic polyneuropathy, without long-term current use of insulin ([place]) last hgba1c was 6.6%. She checks her blood sugars every  monring and it has been running below 120 consistently. No hypoglycemic episodes. She is on trajenta but insurance doe snot want o pay for it. Prior auth letter was sent o wrong place.  4. Age-related osteoporosis without current pathological fracture  Denies back pain. Last Dexascan was 11/01/14 t score -2.6. She is currently only taking vitamind and calcium supplements OTC.  5. Chronic kidney disease, stage III (moderate) (HCC)  We are currently just watching labs for any changes  6. Endometrial cancer [facility])  Last oncology visit was 06/04/15. She was suppose to follow up in 1 year but has not done so. We doid papa here at our office 12/04/16 which was normal.  7. BMI 28.0-28.9,adult  No recent eight changes  8. Functional diarrhea  Has gotten some  better since stopping metformin but still has daly. Cannot take Questran because it messes with her diabetic medication.  9. GAD (generalized anxiety disorder)  Not as stressed as she use to be because her mother in law passed away and  she is not longer having to help care for her.  10. Hyperlipidemia, unspecified hyperlipidemia type  Not really watching diet    New complaints: None today  Social history: Lives with husband - they are both dong quite well.    Review of Systems  Constitutional: Negative for activity change and appetite change.  HENT: Negative.   Eyes: Negative for pain.  Respiratory: Negative for shortness of breath.   Cardiovascular: Negative for chest pain, palpitations and leg swelling.  Gastrointestinal: Negative for abdominal pain.  Endocrine: Negative for polydipsia.  Genitourinary: Negative.     Skin: Negative for rash.  Neurological: Negative for dizziness, weakness and headaches.  Hematological: Does not bruise/bleed easily.  Psychiatric/Behavioral: Negative.   All other systems reviewed and are negative.      Objective:   Physical Exam  Constitutional: She is oriented to person, place, and time. She appears well-developed and well-nourished.  HENT:  Nose: Nose normal.  Mouth/Throat: Oropharynx is clear and moist.  Eyes: EOM are normal.  Neck: Trachea normal, normal range of motion and full passive range of motion without pain. Neck supple. No JVD present. Carotid bruit is not present. No thyromegaly present.  Cardiovascular: Normal rate, regular rhythm, normal heart sounds and intact distal pulses. Exam reveals no gallop and no friction rub.  No murmur heard. Pulmonary/Chest: Effort normal and breath sounds normal.  Abdominal: Soft. Bowel sounds are normal. She exhibits no distension and no mass. There is no tenderness.  Musculoskeletal: Normal range of motion.  Lymphadenopathy:    She has no cervical adenopathy.  Neurological: She is alert and oriented to person, place, and time. She has normal reflexes.  Skin: Skin is warm and dry.  Psychiatric: She has a normal mood and affect. Her behavior is normal. Judgment and thought content normal.   BP 135/66   Pulse 87   Temp (!) 96.7 F (35.9 C) (Oral)   Ht 5\' 5"  (1.651 m)   Wt 162 lb 6 oz (73.7 kg)   BMI 27.02 kg/m   hgba1c 6.6 today     Assessment & Plan:  1. Essential hypertension Low sodium diet  2. Gastroesophageal reflux disease without esophagitis Avoid spicy foods Do not eat 2 hours prior to bedtime - dexlansoprazole (DEXILANT) 60 MG capsule; Take 1 capsule (60 mg total) by mouth daily.  Dispense: 90 capsule; Refill: 1  3. Type 2 diabetes mellitus with diabetic polyneuropathy, without long-term current use of insulin (HCC) Continue to watch carbsion diet Will contact insurance about tradjenta -  Bayer DCA Hb A1c Waived - gabapentin (NEURONTIN) 100 MG capsule; Take 1 capsule (100 mg total) by mouth daily.  Dispense: 90 capsule; Refill: 1 - Dulaglutide (TRULICITY) 1.61 WR/6.0AV SOPN; Inject 0.75 mg into the skin once a week.  Dispense: 4 pen; Refill: 5  4. Age-related osteoporosis without current pathological fracture Weight bearing exercises - DG [facility] DEXA  5. Chronic kidney disease, stage III (moderate) (HCC) Labs pending  6. Endometrial cancer ([place]) will keep follow ups  7. BMI 28.0-28.9,adult Discussed diet and exercise for person with BMI >25 Will recheck weight in 3-6 months  8. Functional diarrhea Continue imodium AD OTC  9. GAD (generalized anxiety disorder) Stress management - LORazepam (ATIVAN) 0.5 MG tablet; Take 1 tablet (0.5 mg total) by mouth 2 (two) times daily as needed for anxiety or sleep.  Dispense: 60 tablet; Refill: 2  10. Hyperlipidemia, unspecified hyperlipidemia type Low fat diet    Labs pending Health maintenance reviewed Diet and  exercise encouraged Continue all meds Follow up  In 3 months   [place], FNP

## 2017-12-28 NOTE — Patient Instructions (Signed)
Bone Health Bones protect organs, store calcium, and anchor muscles. Good health habits, such as eating nutritious foods and exercising regularly, are important for maintaining healthy bones. They can also help to prevent a condition that causes bones to lose density and become weak and brittle (osteoporosis). Why is bone mass important? Bone mass refers to the amount of bone tissue that you have. The higher your bone mass, the stronger your bones. An important step toward having healthy bones throughout life is to have strong and dense bones during childhood. A young adult who has a high bone mass is more likely to have a high bone mass later in life. Bone mass at its greatest it is called peak bone mass. A large decline in bone mass occurs in older adults. In women, it occurs about the time of menopause. During this time, it is important to practice good health habits, because if more bone is lost than what is replaced, the bones will become less healthy and more likely to break (fracture). If you find that you have a low bone mass, you may be able to prevent osteoporosis or further bone loss by changing your diet and lifestyle. How can I find out if my bone mass is low? Bone mass can be measured with an X-ray test that is called a bone mineral density (BMD) test. This test is recommended for all women who are age 65 or older. It may also be recommended for men who are age 70 or older, or for people who are more likely to develop osteoporosis due to:  Having bones that break easily.  Having a long-term disease that weakens bones, such as kidney disease or rheumatoid arthritis.  Having menopause earlier than normal.  Taking medicine that weakens bones, such as steroids, thyroid hormones, or hormone treatment for breast cancer or prostate cancer.  Smoking.  Drinking three or more alcoholic drinks each day.  What are the nutritional recommendations for healthy bones? To have healthy bones, you  need to get enough of the right minerals and vitamins. Most nutrition experts recommend getting these nutrients from the foods that you eat. Nutritional recommendations vary from person to person. Ask your health care provider what is healthy for you. Here are some general guidelines. Calcium Recommendations Calcium is the most important (essential) mineral for bone health. Most people can get enough calcium from their diet, but supplements may be recommended for people who are at risk for osteoporosis. Good sources of calcium include:  Dairy products, such as low-fat or nonfat milk, cheese, and yogurt.  Dark green leafy vegetables, such as bok choy and broccoli.  Calcium-fortified foods, such as orange juice, cereal, bread, soy beverages, and tofu products.  Nuts, such as almonds.  Follow these recommended amounts for daily calcium intake:  Children, age 1?3: 700 mg.  Children, age 4?8: 1,000 mg.  Children, age 9?13: 1,300 mg.  Teens, age 14?18: 1,300 mg.  Adults, age 19?50: 1,000 mg.  Adults, age 51?70: ? Men: 1,000 mg. ? Women: 1,200 mg.  Adults, age 71 or older: 1,200 mg.  Pregnant and breastfeeding females: ? Teens: 1,300 mg. ? Adults: 1,000 mg.  Vitamin D Recommendations Vitamin D is the most essential vitamin for bone health. It helps the body to absorb calcium. Sunlight stimulates the skin to make vitamin D, so be sure to get enough sunlight. If you live in a cold climate or you do not get outside often, your health care provider may recommend that you take vitamin   D supplements. Good sources of vitamin D in your diet include:  Egg yolks.  Saltwater fish.  Milk and cereal fortified with vitamin D.  Follow these recommended amounts for daily vitamin D intake:  Children and teens, age 1?18: 600 international units.  Adults, age 50 or younger: 400-800 international units.  Adults, age 51 or older: 800-1,000 international units.  Other Nutrients Other nutrients  for bone health include:  Phosphorus. This mineral is found in meat, poultry, dairy foods, nuts, and legumes. The recommended daily intake for adult men and adult women is 700 mg.  Magnesium. This mineral is found in seeds, nuts, dark green vegetables, and legumes. The recommended daily intake for adult men is 400?420 mg. For adult women, it is 310?320 mg.  Vitamin K. This vitamin is found in green leafy vegetables. The recommended daily intake is 120 mg for adult men and 90 mg for adult women.  What type of physical activity is best for building and maintaining healthy bones? Weight-bearing and strength-building activities are important for building and maintaining peak bone mass. Weight-bearing activities cause muscles and bones to work against gravity. Strength-building activities increases muscle strength that supports bones. Weight-bearing and muscle-building activities include:  Walking and hiking.  Jogging and running.  Dancing.  Gym exercises.  Lifting weights.  Tennis and racquetball.  Climbing stairs.  Aerobics.  Adults should get at least 30 minutes of moderate physical activity on most days. Children should get at least 60 minutes of moderate physical activity on most days. Ask your health care provide what type of exercise is best for you. Where can I find more information? For more information, check out the following websites:  National Osteoporosis Foundation: http://nof.org/learn/basics  National Institutes of Health: http://www.niams.nih.gov/Health_Info/Bone/Bone_Health/bone_health_for_life.asp  This information is not intended to replace advice given to you by your health care provider. Make sure you discuss any questions you have with your health care provider. Document Released: 12/20/2003 Document Revised: 04/18/2016 Document Reviewed: 10/04/2014 Elsevier Interactive Patient Education  2018 [employer]  

## 2017-12-31 DIAGNOSIS — B0233 Zoster keratitis: Secondary | ICD-10-CM | POA: Diagnosis not present

## 2018-01-01 ENCOUNTER — Telehealth: Payer: Self-pay

## 2018-01-01 NOTE — Telephone Encounter (Signed)
Did you put on there that other meds cause stomach problems Do you know why they deneid it- sh ehas been on it for awhile.

## 2018-01-01 NOTE — Telephone Encounter (Signed)
Insurance denied prior [place] for [employer]

## 2018-01-07 DIAGNOSIS — H353122 Nonexudative age-related macular degeneration, left eye, intermediate dry stage: Secondary | ICD-10-CM | POA: Diagnosis not present

## 2018-01-07 DIAGNOSIS — B0230 Zoster ocular disease, unspecified: Secondary | ICD-10-CM | POA: Diagnosis not present

## 2018-01-07 DIAGNOSIS — E113551 Type 2 diabetes mellitus with stable proliferative diabetic retinopathy, right eye: Secondary | ICD-10-CM | POA: Diagnosis not present

## 2018-01-07 DIAGNOSIS — E113492 Type 2 diabetes mellitus with severe nonproliferative diabetic retinopathy without macular edema, left eye: Secondary | ICD-10-CM | POA: Diagnosis not present

## 2018-01-14 DIAGNOSIS — H1851 Endothelial corneal dystrophy: Secondary | ICD-10-CM | POA: Diagnosis not present

## 2018-01-14 DIAGNOSIS — B0233 Zoster keratitis: Secondary | ICD-10-CM | POA: Diagnosis not present

## 2018-01-14 DIAGNOSIS — H401221 Low-tension glaucoma, left eye, mild stage: Secondary | ICD-10-CM | POA: Diagnosis not present

## 2018-01-14 DIAGNOSIS — H401212 Low-tension glaucoma, right eye, moderate stage: Secondary | ICD-10-CM | POA: Diagnosis not present

## 2018-02-02 ENCOUNTER — Telehealth: Payer: Self-pay

## 2018-02-02 NOTE — Telephone Encounter (Signed)
Do you know which Dpp4 inhibitors they will pay for.

## 2018-02-02 NOTE — Telephone Encounter (Signed)
Just spent at least a month trying to get Tradjenta approved.  INitally denied and had to do appeal  Now patient is calling stating that it is too expensive  NOthing I can do about the tier of cost

## 2018-02-03 NOTE — Telephone Encounter (Signed)
No because it approved Tradjenta   Patient would have to look into that through her insurance

## 2018-02-04 NOTE — Telephone Encounter (Signed)
Please have patient find out which DPP4 insurance will pay for since trajenta is to expensive. Jackelyn Poling says she is not able to find out.

## 2018-02-04 NOTE — Telephone Encounter (Signed)
Pt aware she needs to call insurance and then let us know which medication they will approve that she can afford.

## 2018-02-12 ENCOUNTER — Encounter: Payer: Self-pay | Admitting: *Deleted

## 2018-02-12 NOTE — Progress Notes (Signed)
Insurance verification submitted.

## 2018-02-16 ENCOUNTER — Ambulatory Visit (INDEPENDENT_AMBULATORY_CARE_PROVIDER_SITE_OTHER): Payer: Medicare Other | Admitting: *Deleted

## 2018-02-16 VITALS — BP 104/59 | HR 88 | Ht 65.0 in | Wt 162.6 lb

## 2018-02-16 DIAGNOSIS — Z0000 Encounter for general adult medical examination without abnormal findings: Secondary | ICD-10-CM

## 2018-02-16 NOTE — Progress Notes (Addendum)
Subjective:   Catherine Cooper is a 77 y.o. female who presents for an Initial Medicare Annual Wellness Visit.  Catherine Cooper is retired from the [employer] of 23 years.  She enjoys quilting, reading and jigsaw puzzles.  Catherine Cooper is unable to exercise due to bilateral foot neuropathy and eat a variety of food for each meal.  She lives at home with her husband, dog and cat.  She also has a daughter and grandson that lives close by and visits often.  Catherine Cooper has had no surgeries or hospitalizations in the last year and feels that her health is overall the same as it was a year ago.    Objective:    Today's Vitals   02/16/18 0830  BP: (!) 104/59  Pulse: 88  Weight: 162 lb 9.6 oz (73.8 kg)  Height: 5\' 5"  (1.651 m)   Body mass index is 27.06 kg/m.  Advanced Directives 03/28/2016 07/18/2015 05/09/2015 02/14/2015 10/25/2014  Does Patient Have a Medical Advance Directive? No No No No No  Would patient like information on creating a medical advance directive? No - patient declined information No - patient declined information - No - patient declined information Yes - Educational materials given    Current Medications (verified) Outpatient Encounter Medications as of 02/16/2018  Medication Sig  . ALPHAGAN P 0.1 % SOLN Place 1 drop into both eyes 2 (two) times daily.   Marland Kitchen aspirin 81 MG tablet Take 81 mg by mouth every evening.   . bifidobacterium infantis (ALIGN) capsule Take 1 capsule by mouth daily.  . calcium citrate-vitamin D (CITRACAL+D) 315-200 MG-UNIT tablet Take 1 tablet by mouth 2 (two) times daily.  . Cholecalciferol (VITAMIN D3) 2000 UNITS TABS Take 1 capsule by mouth daily.   Marland Kitchen dexlansoprazole (DEXILANT) 60 MG capsule Take 1 capsule (60 mg total) by mouth daily.  . Dulaglutide (TRULICITY) 1.54 MG/8.6PY SOPN Inject 0.75 mg into the skin once a week.  . gabapentin (NEURONTIN) 100 MG capsule Take 1 capsule (100 mg total) by mouth daily.  Marland Kitchen glipiZIDE (GLIPIZIDE  XL) 10 MG 24 hr tablet Take 1 tablet (10 mg total) by mouth daily with breakfast.  . ibuprofen (ADVIL,MOTRIN) 800 MG tablet Take 1 tablet (800 mg total) by mouth every 6 (six) hours as needed for pain.  Marland Kitchen latanoprost (XALATAN) 0.005 % ophthalmic solution Place 1 drop into both eyes at bedtime.   Marland Kitchen linagliptin (TRADJENTA) 5 MG TABS tablet Take 1 tablet (5 mg total) by mouth at bedtime.  Marland Kitchen LORazepam (ATIVAN) 0.5 MG tablet Take 1 tablet (0.5 mg total) by mouth 2 (two) times daily as needed for anxiety or sleep.  . Multiple Vitamins-Minerals (PRESERVISION/LUTEIN PO) Take by mouth.  . ONE TOUCH ULTRA TEST test strip Test BS once daily and PRN dx.E11.9  . vitamin B-12 (CYANOCOBALAMIN) 1000 MCG tablet Take 1,000 mcg by mouth daily.     No facility-administered encounter medications on file as of 02/16/2018.     Allergies (verified) Januvia [sitagliptin]; Jardiance [empagliflozin]; Lipitor [atorvastatin]; and Penicillins   History: Past Medical History:  Diagnosis Date  . Achilles rupture, left   . Anxiety   . Cataracts, bilateral   . 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 Diabetes mellitus    type II  . Diabetic neuropathy ([place])   . Diarrhea    attributed to diabetic meds  . Endometrioid adenocarcinoma   . GAD (generalized anxiety disorder)   .  GERD (gastroesophageal reflux disease)   . Glaucoma   . Hyperlipidemia    not on medications  . Iron deficiency   . Nephrolithiasis 2000  . Neuropathy    both feet  . Osteoporosis   . Shingles 2004   Past Surgical History:  Procedure Laterality Date  .  vitrectomy  02/23/08   posterior; right eye  . ABDOMINAL HYSTERECTOMY    . ACHILLES TENDON SURGERY Left 03/28/2016   Procedure: Reconstruction Left Achilles;  Surgeon: Newt Minion, MD;  Location: [place];  Service: Orthopedics;  Laterality: Left;  . 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 COLONOSCOPY  2013  . EYE SURGERY    . FOOT SURGERY     Right foot / left heel  . glaucome surgery right eye    . SKIN GRAFT Right 2007   foot  . TUBAL LIGATION     Family History  Problem Relation Age of [place]  . Dementia Father   . Diabetes Father   . Coronary artery disease Father 3  . Hip fracture Father   . Atrial fibrillation Mother   . Osteoporosis Mother    Social History   Socioeconomic History  . Marital status: Married    Spouse name: Not on file  . Number of children: 3  . Years of education: Not on file  . Highest education level: Not on file  Occupational History  . Occupation: retired    Comment: [employer]  Social Needs  . Financial resource strain: Not on file  . Food insecurity:    Worry: Not on file    Inability: Not on file  . Transportation needs:    Medical: Not on file    Non-medical: Not on file  Tobacco Use  . Smoking status: Never Smoker  . Smokeless tobacco: Never Used  Substance and Sexual Activity  . Alcohol use: No  . Drug use: No  . Sexual activity: Not on file  Lifestyle  . Physical activity:    Days per week: Not on file    Minutes per session: Not on file  . Stress: Not on file  Relationships  . Social connections:    Talks on phone: Not on file    Gets together: Not on file    Attends religious service: Not on file    Active member of club or organization: Not on file    Attends meetings of clubs or organizations: Not on file    Relationship status: Not on file  Other Topics Concern  . Not on file  Social History Narrative   2 healthy daughters and 1 son deceased (suicide @ 38)    Tobacco Counseling No Tobacco use    Activities of Daily Living In your present state of health, do you have any difficulty performing the following activities: 02/16/2018  Hearing? N  Vision? N  Difficulty concentrating or making decisions? N    Walking or climbing stairs? Y  Comment neuropathy in bilateral feet and legs  Dressing or bathing? N  Doing errands, shopping? N  Some recent data might be hidden  Diabetic neuropathy in bilateral feet and legs    Immunizations and Health Maintenance Immunization History  Administered Date(s) Administered  . Influenza Whole 08/12/2010  . Influenza, High Dose Seasonal PF 08/21/2016, 07/14/2017  . Influenza,inj,Quad PF,6+ Mos 07/04/2013, 07/20/2014, 07/18/2015  .  Pneumococcal Conjugate-13 01/31/2015  . Pneumococcal Polysaccharide-23 08/12/2010  . Zoster 04/12/2014  Tdap 02/12/2011 Health Maintenance Due  Topic Date Due  . MAMMOGRAM  01/07/2018  Mammogram schedule for 03/09/18  Patient Care Team: Chevis Pretty, [place] as PCP - General (Nurse Practitioner) Steffanie Rainwater, DPM as Consulting Physician (Podiatry) Zadie Rhine Clent Demark, MD as Consulting Physician (Ophthalmology) Monna Fam, MD as Consulting Physician (Ophthalmology) Teena Irani, MD (Inactive) as Consulting Physician (Gastroenterology) Newt Minion, MD as Consulting Physician (Orthopedic Surgery)  Indicate any recent Medical Services you may have received from other than [facility] providers in the past year (date may be approximate).     Assessment:   This is a routine wellness examination for Jood.  Hearing/Vision screen No vision or hearing loss noted Dietary issues and exercise activities discussed: Current Exercise Habits: The patient does not participate in regular exercise at present, Exercise limited by: Other - see comments  Goals    . Exercise 3x per week (30 min per time)     Try to exercise for at least 30 minutes three times weekly.  Seated exercise      Sit to stand exercise hand out given Depression Screen PHQ 2/9 Scores 02/16/2018 12/28/2017 09/17/2017 09/17/2017 06/16/2017 03/05/2017 12/04/2016  PHQ - 2 Score 0 0 0 0 0 0 0    Fall Risk Fall Risk  12/28/2017 09/17/2017 09/17/2017 06/16/2017 03/05/2017   Falls in the past year? No No No No No    Is the patient's home free of loose throw rugs in walkways, pet beds, electrical cords, etc?   yes      Grab bars in the bathroom? yes      Handrails on the stairs?   yes      Adequate lighting?   yes  Timed Get Up and Go Performed   Cognitive Function: MMSE - Mini Mental State Exam 02/16/2018 07/18/2015  Orientation to time 5 5  Orientation to Place 5 5  Registration 3 3  Attention/ Calculation 5 5  Recall 3 3  Language- name 2 objects 2 2  Language- repeat 1 1  Language- follow 3 step command 3 3  Language- read & follow direction 1 1  Write a sentence 1 1  Copy design 1 1  Total score 30 30    No Memory loss noted at this time    Screening Tests Health Maintenance  Topic Date Due  . MAMMOGRAM  01/07/2018  . INFLUENZA VACCINE  05/13/2018  . FOOT EXAM  06/16/2018  . HEMOGLOBIN A1C  06/30/2018  . OPHTHALMOLOGY EXAM  07/16/2018  . URINE MICROALBUMIN  09/17/2018  . DEXA SCAN  12/29/2019  . TETANUS/TDAP  02/10/2021  . PNA vac Low Risk Adult  Completed  Mammogram scheduled for 03/09/18 Shingrix handout given Tdap done 02/12/2011   Qualifies for Shingles Vaccine? Yes, Shingrix handout given to discuss at next follow up appointment. Declines today.  Cancer Screenings: Lung: Low Dose CT Chest recommended if Age 92-80 years, 30 pack-year currently smoking OR have quit w/in 15years. Patient does not qualify. Breast: Up to date on Mammogram? No-Scheduled for 03/09/18   Up to date of Bone Density/Dexa? Yes Colorectal: yes  Additional Screenings: Hepatitis C Screening:      Plan:  Encouraged Mrs. Bhullar to eat three diabetic healthy meals daily and to try sit to stand exercise at least three times weekly. Encouraged to keep all follow up appointments and appointment for mammogram on 03/09/2018.  Explained the importance of shingles vaccine  and handout provided.    I have personally reviewed and noted the following in the patient's  chart:   . Medical and social history . Use of alcohol, tobacco or illicit drugs  . Current medications and supplements . Functional ability and status . Nutritional status . Physical activity . Advanced directives . List of other physicians . Hospitalizations, surgeries, and ER visits in previous 12 months . Vitals . Screenings to include cognitive, depression, and falls . Referrals and appointments  In addition, I have reviewed and discussed with patient certain preventive protocols, quality metrics, and best practice recommendations. A written personalized care plan for preventive services as well as general preventive health recommendations were provided to patient.     Wardell Heath, LPN   12/15/1936   I have reviewed and agree with the above AWV documentation.   Mary-Margaret Hassell Done, FNP

## 2018-02-16 NOTE — Patient Instructions (Addendum)
Keep follow up appointment with Catherine Cooper Done, FNP Discuss Shingrix- two dose shingles vaccine  Ms. [name] , Thank you for taking time to come for your Medicare Wellness Visit. I appreciate your ongoing commitment to your health goals. Please review the following plan we discussed and let me know if I can assist you in the future.   These are the goals we discussed: Goals    . Exercise 3x per week (30 min per time)     Try to exercise for at least 30 minutes three times weekly.  Seated exercise        This is a list of the screening recommended for you and due dates:  Health Maintenance  Topic Date Due  . Mammogram  01/07/2018  . Flu Shot  05/13/2018  . Complete foot exam   06/16/2018  . Hemoglobin A1C  06/30/2018  . Eye exam for diabetics  07/16/2018  . Urine Protein Check  09/17/2018  . DEXA scan (bone density measurement)  12/29/2019  . Tetanus Vaccine  02/10/2021  . Pneumonia vaccines  Completed    Sit-to-Stand Exercise The sit-to-stand exercise (also known as the chair stand or chair rise exercise) strengthens your lower body and helps you maintain or improve your mobility and independence. The goal is to do the sit-to-stand exercise without using your hands. This will be easier as you become stronger. You should always talk with your health care provider before starting any exercise program, especially if you have had recent surgery. Do the exercise exactly as told by your health care provider and adjust it as directed. It is normal to feel mild stretching, pulling, tightness, or discomfort as you do this exercise, but you should stop right away if you feel sudden pain or your pain gets worse. Do not begin doing this exercise until told by your health care provider. What the sit-to-stand exercise does The sit-to-stand exercise helps to strengthen the muscles in your thighs and the muscles in the center of your body that give you stability (core muscles). This exercise  is especially helpful if:  You have had knee or hip surgery.  You have trouble getting up from a chair, out of a car, or off the toilet.  How to do the sit-to-stand exercise 1. Sit toward the front edge of a sturdy chair without armrests. Your knees should be bent and your feet should be flat on the floor and shoulder-width apart. 2. Place your hands lightly on each side of the seat. Keep your back and neck as straight as possible, with your chest slightly forward. 3. Breathe in slowly. Lean forward and slightly shift your weight to the front of your feet. 4. Breathe out as you slowly stand up. Use your hands as little as possible. 5. Stand and pause for a full breath in and out. 6. Breathe in as you sit down slowly. Tighten your core and abdominal muscles to control your lowering as much as possible. 7. Breathe out slowly. 8. Do this exercise 10-15 times. If needed, do it fewer times until you build up strength. 9. Rest for 1 minute, then do another set of 10-15 repetitions. To change the difficulty of the sit-to-stand exercise  If the exercise is too difficult, use a chair with sturdy armrests, and push off the armrests to help you come to the standing position. You can also use the armrests to help slowly lower yourself back to sitting. As this gets easier, try to use your arms less. You  can also place a firm cushion or pillow on the chair to make the surface higher.  If this exercise is too easy, do not use your arms to help raise or lower yourself. You can also wear a weighted vest, use hand weights, increase your repetitions, or try a lower chair. General tips  You may feel tired when starting an exercise routine. This is normal.  You may have muscle soreness that lasts a few days. This is normal. As you get stronger, you may not feel muscle soreness.  Use smooth, steady movements.  Do not  hold your breath during strength exercises. This can cause unsafe changes in your blood  pressure.  Breathe in slowly through your nose, and breathe out slowly through your mouth. Summary  Strengthening your lower body is an important step to help you move safely and independently.  The sit-to-stand exercise helps strengthen the muscles in your thighs and core.  You should always talk with your health care provider before starting any exercise program, especially if you have had recent surgery. This information is not intended to replace advice given to you by your health care provider. Make sure you discuss any questions you have with your health care provider. Document Released: 11/20/2016 Document Revised: 11/20/2016 Document Reviewed: 11/20/2016 Elsevier Interactive Patient Education  2018 [employer].

## 2018-02-18 NOTE — Progress Notes (Signed)
Summary of benefits received. $40 copay for buy and bill. Prior authorization is needed for secondary insurance but not primary. Prior authorization initiated at  [email or web address].

## 2018-02-25 DIAGNOSIS — L84 Corns and callosities: Secondary | ICD-10-CM | POA: Diagnosis not present

## 2018-02-25 DIAGNOSIS — B351 Tinea unguium: Secondary | ICD-10-CM | POA: Diagnosis not present

## 2018-02-25 DIAGNOSIS — E1142 Type 2 diabetes mellitus with diabetic polyneuropathy: Secondary | ICD-10-CM | POA: Diagnosis not present

## 2018-02-25 DIAGNOSIS — M79676 Pain in unspecified toe(s): Secondary | ICD-10-CM | POA: Diagnosis not present

## 2018-03-09 DIAGNOSIS — Z1231 Encounter for screening mammogram for malignant neoplasm of breast: Secondary | ICD-10-CM | POA: Diagnosis not present

## 2018-03-09 LAB — HM MAMMOGRAPHY

## 2018-03-10 DIAGNOSIS — H179 Unspecified corneal scar and opacity: Secondary | ICD-10-CM | POA: Diagnosis not present

## 2018-03-10 DIAGNOSIS — B0233 Zoster keratitis: Secondary | ICD-10-CM | POA: Diagnosis not present

## 2018-03-10 DIAGNOSIS — H401212 Low-tension glaucoma, right eye, moderate stage: Secondary | ICD-10-CM | POA: Diagnosis not present

## 2018-03-10 DIAGNOSIS — H401221 Low-tension glaucoma, left eye, mild stage: Secondary | ICD-10-CM | POA: Diagnosis not present

## 2018-03-12 NOTE — Progress Notes (Signed)
Prior authorization was submitted 3 weeks ago but I did not receive a response. Refaxed today.

## 2018-03-15 ENCOUNTER — Telehealth: Payer: Self-pay | Admitting: Nurse Practitioner

## 2018-03-15 NOTE — Telephone Encounter (Signed)
[employer] has called and stated that they are needing a recent dexa scan sent to them that shows that the pt has osteoporosis, please fax to [phone] and put reference # [id number] on cover sheet.

## 2018-03-15 NOTE — Telephone Encounter (Signed)
Copy of dexa scan sent to [facility] for review.

## 2018-03-29 DIAGNOSIS — H401212 Low-tension glaucoma, right eye, moderate stage: Secondary | ICD-10-CM | POA: Diagnosis not present

## 2018-04-06 ENCOUNTER — Encounter: Payer: Self-pay | Admitting: Nurse Practitioner

## 2018-04-06 ENCOUNTER — Ambulatory Visit (INDEPENDENT_AMBULATORY_CARE_PROVIDER_SITE_OTHER): Payer: Medicare Other | Admitting: Nurse Practitioner

## 2018-04-06 VITALS — BP 130/75 | HR 90 | Temp 97.8°F | Ht 65.0 in | Wt 160.0 lb

## 2018-04-06 DIAGNOSIS — I10 Essential (primary) hypertension: Secondary | ICD-10-CM | POA: Diagnosis not present

## 2018-04-06 DIAGNOSIS — N183 Chronic kidney disease, stage 3 unspecified: Secondary | ICD-10-CM

## 2018-04-06 DIAGNOSIS — K219 Gastro-esophageal reflux disease without esophagitis: Secondary | ICD-10-CM | POA: Diagnosis not present

## 2018-04-06 DIAGNOSIS — E1142 Type 2 diabetes mellitus with diabetic polyneuropathy: Secondary | ICD-10-CM | POA: Diagnosis not present

## 2018-04-06 DIAGNOSIS — F411 Generalized anxiety disorder: Secondary | ICD-10-CM | POA: Diagnosis not present

## 2018-04-06 DIAGNOSIS — Z6828 Body mass index (BMI) 28.0-28.9, adult: Secondary | ICD-10-CM | POA: Diagnosis not present

## 2018-04-06 DIAGNOSIS — R55 Syncope and collapse: Secondary | ICD-10-CM

## 2018-04-06 DIAGNOSIS — M810 Age-related osteoporosis without current pathological fracture: Secondary | ICD-10-CM | POA: Diagnosis not present

## 2018-04-06 DIAGNOSIS — C541 Malignant neoplasm of endometrium: Secondary | ICD-10-CM

## 2018-04-06 DIAGNOSIS — R6889 Other general symptoms and signs: Secondary | ICD-10-CM | POA: Diagnosis not present

## 2018-04-06 DIAGNOSIS — E785 Hyperlipidemia, unspecified: Secondary | ICD-10-CM

## 2018-04-06 LAB — BAYER DCA HB A1C WAIVED: HB A1C (BAYER DCA - WAIVED): 6.9 % (ref <7.0)

## 2018-04-06 MED ORDER — ONETOUCH ULTRA BLUE VI STRP: ORAL_STRIP | 11 refills | Status: DC

## 2018-04-06 MED ORDER — DULAGLUTIDE 0.75 MG/0.5ML ~~LOC~~ SOAJ: 0.7500 mg | SUBCUTANEOUS | 5 refills | Status: DC

## 2018-04-06 MED ORDER — DEXLANSOPRAZOLE 60 MG PO CPDR: 1.0000 | DELAYED_RELEASE_CAPSULE | Freq: Every day | ORAL | 1 refills | Status: DC

## 2018-04-06 MED ORDER — GABAPENTIN 100 MG PO CAPS: 100.0000 mg | ORAL_CAPSULE | Freq: Every day | ORAL | 1 refills | Status: DC

## 2018-04-06 NOTE — Patient Instructions (Signed)

## 2018-04-06 NOTE — Progress Notes (Signed)
Subjective:    Patient ID: Catherine Cooper, female    DOB: [birth year], 77 y.o.   MRN: [id number]   Chief Complaint: Medical Management of Chronic issues  HPI:  1. Essential hypertension  No c/o chest pain, sob or headache. Does not check blood pressure at home. BP Readings from Last 3 Encounters:  02/16/18 (!) 104/59  12/28/17 135/66  09/17/17 (!) 149/75     2. Gastroesophageal reflux disease without esophagitis  Is currently on dexilant daily. She has bad reflux and can still have symptoms despite taking dexilant  3. Type 2 diabetes mellitus with diabetic polyneuropathy, without long-term current use of insulin ([place]) last hgba1c was 6.6%. Her bllod sugars run between 120-140 most of time when fasting. She denies any hypoglycemia   4. Chronic kidney disease, stage III (moderate) (HCC)  last creatine was 0.89 with gfr of 63. We are currently just watching labs  5. Hyperlipidemia, unspecified hyperlipidemia type  Does not watch diet very closely. No exercise  6. GAD (generalized anxiety disorder)  Stress level has gotten some better since her mother in law passed away. Sh ehas ativan to take as needed.  7. BMI 28.0-28.9,adult  No recent weight changes  8. Endometrial cancer ([place])  Had 3-4 years ago and had hysterectomy. No other treatments were needed.  9. Age-related osteoporosis without current pathological fracture  Last BMD test was done 11/01/14 with tscore of -2.6. She had met with T. Eckard about her osteoporosis and was told to stop her alendronenate because she has been on it for over 10 years. She was suppose to have repeat BMD test done a year later . BMD done 12/28/17 with t score of -2.7- will repeatin a year.    Outpatient Encounter Medications as of 04/06/2018  Medication Sig  . ALPHAGAN P 0.1 % SOLN Place 1 drop into both eyes 2 (two) times daily.   Marland Kitchen aspirin 81 MG tablet Take 81 mg by mouth every evening.   . bifidobacterium infantis (ALIGN) capsule Take 1 capsule  by mouth daily.  . calcium citrate-vitamin D (CITRACAL+D) 315-200 MG-UNIT tablet Take 1 tablet by mouth 2 (two) times daily.  . Cholecalciferol (VITAMIN D3) 2000 UNITS TABS Take 1 capsule by mouth daily.   Marland Kitchen dexlansoprazole (DEXILANT) 60 MG capsule Take 1 capsule (60 mg total) by mouth daily.  . Dulaglutide (TRULICITY) 8.32 PQ/9.8YM SOPN Inject 0.75 mg into the skin once a week.  . gabapentin (NEURONTIN) 100 MG capsule Take 1 capsule (100 mg total) by mouth daily.  Marland Kitchen glipiZIDE (GLIPIZIDE XL) 10 MG 24 hr tablet Take 1 tablet (10 mg total) by mouth daily with breakfast.  . ibuprofen (ADVIL,MOTRIN) 800 MG tablet Take 1 tablet (800 mg total) by mouth every 6 (six) hours as needed for pain.  Marland Kitchen latanoprost (XALATAN) 0.005 % ophthalmic solution Place 1 drop into both eyes at bedtime.   Marland Kitchen linagliptin (TRADJENTA) 5 MG TABS tablet Take 1 tablet (5 mg total) by mouth at bedtime.  Marland Kitchen LORazepam (ATIVAN) 0.5 MG tablet Take 1 tablet (0.5 mg total) by mouth 2 (two) times daily as needed for anxiety or sleep.  . Multiple Vitamins-Minerals (PRESERVISION/LUTEIN PO) Take by mouth.  . ONE TOUCH ULTRA TEST test strip Test BS once daily and PRN dx.E11.9  . vitamin B-12 (CYANOCOBALAMIN) 1000 MCG tablet Take 1,000 mcg by mouth daily.         New complaints: On June 1 she had an episoe where she felt really bad. She got  up to go to restroom and passed out. She does not know how long she was out. denies hitting her head. She eventually got herself out of floor and since then she has been really tired.   Social history: Lives with her husband who is fairly healthy   Review of Systems  Constitutional: Negative for activity change and appetite change.  HENT: Negative.   Eyes: Negative for pain.  Respiratory: Negative for shortness of breath.   Cardiovascular: Negative for chest pain, palpitations and leg swelling.  Gastrointestinal: Positive for diarrhea. Negative for abdominal pain.  Endocrine: Negative for  polydipsia.  Genitourinary: Negative.   Skin: Negative for rash.  Neurological: Negative for dizziness, weakness and headaches.  Hematological: Does not bruise/bleed easily.  Psychiatric/Behavioral: Negative.   All other systems reviewed and are negative.      Objective:   Physical Exam  Constitutional: She is oriented to person, place, and time.  HENT:  Head: Normocephalic.  Nose: Nose normal.  Mouth/Throat: Oropharynx is clear and moist.  Eyes: Pupils are equal, round, and reactive to light. EOM are normal.  Neck: Normal range of motion. Neck supple. No JVD present. Carotid bruit is not present.  Cardiovascular: Normal rate, regular rhythm, normal heart sounds and intact distal pulses.  Pulmonary/Chest: Effort normal and breath sounds normal. No respiratory distress. She has no wheezes. She has no rales. She exhibits no tenderness.  Abdominal: Soft. Normal appearance, normal aorta and bowel sounds are normal. She exhibits no distension, no abdominal bruit, no pulsatile midline mass and no mass. There is no splenomegaly or hepatomegaly. There is no tenderness.  Musculoskeletal: Normal range of motion. She exhibits no edema.  Lymphadenopathy:    She has no cervical adenopathy.  Neurological: She is alert and oriented to person, place, and time. She has normal reflexes.  Skin: Skin is warm and dry.  Psychiatric: Judgment normal.   BP 130/75   Pulse 90   Temp 97.8 F (36.6 C) (Oral)   Ht 5' 5"  (1.651 m)   Wt 160 lb (72.6 kg)   BMI 26.63 kg/m   hgba1c 6.9% today  EKG- Kerry Hough, FNP      Assessment & Plan:  MALLERIE BLOK comes in today with chief complaint of Medical Management of Chronic Issues   Diagnosis and orders addressed:  1. Essential hypertension Low sodium diet - CMP14+EGFR  2. Gastroesophageal reflux disease without esophagitis Avoid spicy foods Do not eat 2 hours prior to bedtime - dexlansoprazole (DEXILANT) 60 MG capsule; Take 1  capsule (60 mg total) by mouth daily.  Dispense: 90 capsule; Refill: 1  3. Type 2 diabetes mellitus with diabetic polyneuropathy, without long-term current use of insulin (HCC) Continue to watch carbsin diet  Keep diary of blood sugars - Bayer DCA Hb A1c Waived - gabapentin (NEURONTIN) 100 MG capsule; Take 1 capsule (100 mg total) by mouth daily.  Dispense: 90 capsule; Refill: 1 - Dulaglutide (TRULICITY) 7.86 VE/7.2CN SOPN; Inject 0.75 mg into the skin once a week.  Dispense: 4 pen; Refill: 5 - ONE TOUCH ULTRA TEST test strip; Test BS once daily and PRN dx.E11.9  Dispense: 100 each; Refill: 11  4. Chronic kidney disease, stage III (moderate) (HCC) Labs pending  5. Hyperlipidemia, unspecified hyperlipidemia type Low fat diet - Lipid panel  6. GAD (generalized anxiety disorder) Stress management  7. BMI 28.0-28.9,adult Discussed diet and exercise for person with BMI >25 Will recheck weight in 3-6 months  8. Endometrial cancer ([place])  9. Age-related  osteoporosis without current pathological fracture Weight bearing exercises  10. Syncope, unspecified syncope type Labs pending Keep diary of episodes To er if completely pass out again - EKG 12-Lead   Labs pending Health Maintenance reviewed Diet and exercise encouraged  Follow up plan: 3 months   [place], FNP

## 2018-04-07 LAB — LIPID PANEL
CHOL/HDL RATIO: 4 ratio (ref 0.0–4.4)
Cholesterol, Total: 162 mg/dL (ref 100–199)
HDL: 41 mg/dL (ref >39)
LDL CALC: 84 mg/dL (ref 0–99)
TRIGLYCERIDES: 184 mg/dL — AB (ref 0–149)
VLDL Cholesterol Cal: 37 mg/dL (ref 5–40)

## 2018-04-07 LAB — CMP14+EGFR
A/G RATIO: 1.5 (ref 1.2–2.2)
ALT: 19 IU/L (ref 0–32)
AST: 17 IU/L (ref 0–40)
Albumin: 3.8 g/dL (ref 3.5–4.8)
Alkaline Phosphatase: 92 IU/L (ref 39–117)
BUN/Creatinine Ratio: 14 (ref 12–28)
BUN: 12 mg/dL (ref 8–27)
Bilirubin Total: 0.4 mg/dL (ref 0.0–1.2)
CALCIUM: 8.9 mg/dL (ref 8.7–10.3)
CO2: 26 mmol/L (ref 20–29)
CREATININE: 0.87 mg/dL (ref 0.57–1.00)
Chloride: 103 mmol/L (ref 96–106)
GFR, EST AFRICAN AMERICAN: 74 mL/min/{1.73_m2} (ref >59)
GFR, EST NON AFRICAN AMERICAN: 64 mL/min/{1.73_m2} (ref >59)
GLOBULIN, TOTAL: 2.6 g/dL (ref 1.5–4.5)
Glucose: 172 mg/dL — ABNORMAL HIGH (ref 65–99)
POTASSIUM: 4.7 mmol/L (ref 3.5–5.2)
Sodium: 140 mmol/L (ref 134–144)
TOTAL PROTEIN: 6.4 g/dL (ref 6.0–8.5)

## 2018-04-07 LAB — ANEMIA PROFILE B
BASOS ABS: 0.1 10*3/uL (ref 0.0–0.2)
Basos: 1 %
EOS (ABSOLUTE): 0.3 10*3/uL (ref 0.0–0.4)
Eos: 6 %
Ferritin: 237 ng/mL — ABNORMAL HIGH (ref 15–150)
Folate: >20 ng/mL (ref >3.0)
Hematocrit: 37.4 % (ref 34.0–46.6)
Hemoglobin: 11.9 g/dL (ref 11.1–15.9)
IRON: 89 ug/dL (ref 27–139)
Immature Grans (Abs): 0 10*3/uL (ref 0.0–0.1)
Immature Granulocytes: 0 %
Iron Saturation: 37 % (ref 15–55)
Lymphocytes Absolute: 2.3 10*3/uL (ref 0.7–3.1)
Lymphs: 44 %
MCH: 31.1 pg (ref 26.6–33.0)
MCHC: 31.8 g/dL (ref 31.5–35.7)
MCV: 98 fL — ABNORMAL HIGH (ref 79–97)
MONOCYTES: 8 %
Monocytes Absolute: 0.4 10*3/uL (ref 0.1–0.9)
NEUTROS ABS: 2.2 10*3/uL (ref 1.4–7.0)
Neutrophils: 41 %
PLATELETS: 131 10*3/uL — AB (ref 150–450)
RBC: 3.83 x10E6/uL (ref 3.77–5.28)
RDW: 12.9 % (ref 12.3–15.4)
RETIC CT PCT: 1.5 % (ref 0.6–2.6)
TIBC: 242 ug/dL — AB (ref 250–450)
UIBC: 153 ug/dL (ref 118–369)
Vitamin B-12: 991 pg/mL (ref 232–1245)
WBC: 5.2 10*3/uL (ref 3.4–10.8)

## 2018-04-29 DIAGNOSIS — L84 Corns and callosities: Secondary | ICD-10-CM | POA: Diagnosis not present

## 2018-04-29 DIAGNOSIS — B351 Tinea unguium: Secondary | ICD-10-CM | POA: Diagnosis not present

## 2018-04-29 DIAGNOSIS — M79676 Pain in unspecified toe(s): Secondary | ICD-10-CM | POA: Diagnosis not present

## 2018-04-29 DIAGNOSIS — E1142 Type 2 diabetes mellitus with diabetic polyneuropathy: Secondary | ICD-10-CM | POA: Diagnosis not present

## 2018-05-21 ENCOUNTER — Telehealth: Payer: Self-pay | Admitting: Nurse Practitioner

## 2018-05-21 NOTE — Telephone Encounter (Signed)
Left message samples are ready for pick up Samples to front for pt pick up

## 2018-05-31 DIAGNOSIS — H179 Unspecified corneal scar and opacity: Secondary | ICD-10-CM | POA: Diagnosis not present

## 2018-05-31 DIAGNOSIS — B0233 Zoster keratitis: Secondary | ICD-10-CM | POA: Diagnosis not present

## 2018-05-31 DIAGNOSIS — H401212 Low-tension glaucoma, right eye, moderate stage: Secondary | ICD-10-CM | POA: Diagnosis not present

## 2018-05-31 DIAGNOSIS — H401221 Low-tension glaucoma, left eye, mild stage: Secondary | ICD-10-CM | POA: Diagnosis not present

## 2018-06-02 ENCOUNTER — Other Ambulatory Visit: Payer: Self-pay | Admitting: *Deleted

## 2018-06-02 DIAGNOSIS — E1142 Type 2 diabetes mellitus with diabetic polyneuropathy: Secondary | ICD-10-CM

## 2018-06-02 MED ORDER — GLIPIZIDE ER 10 MG PO TB24: 10.0000 mg | ORAL_TABLET | Freq: Every day | ORAL | 0 refills | Status: DC

## 2018-07-01 DIAGNOSIS — M79676 Pain in unspecified toe(s): Secondary | ICD-10-CM | POA: Diagnosis not present

## 2018-07-01 DIAGNOSIS — B351 Tinea unguium: Secondary | ICD-10-CM | POA: Diagnosis not present

## 2018-07-01 DIAGNOSIS — L84 Corns and callosities: Secondary | ICD-10-CM | POA: Diagnosis not present

## 2018-07-01 DIAGNOSIS — E1142 Type 2 diabetes mellitus with diabetic polyneuropathy: Secondary | ICD-10-CM | POA: Diagnosis not present

## 2018-07-12 ENCOUNTER — Encounter: Payer: Self-pay | Admitting: Nurse Practitioner

## 2018-07-12 ENCOUNTER — Ambulatory Visit (INDEPENDENT_AMBULATORY_CARE_PROVIDER_SITE_OTHER): Payer: Medicare Other | Admitting: Nurse Practitioner

## 2018-07-12 VITALS — BP 133/71 | HR 87 | Temp 97.7°F | Ht 65.0 in | Wt 163.0 lb

## 2018-07-12 DIAGNOSIS — M810 Age-related osteoporosis without current pathological fracture: Secondary | ICD-10-CM

## 2018-07-12 DIAGNOSIS — K591 Functional diarrhea: Secondary | ICD-10-CM

## 2018-07-12 DIAGNOSIS — F411 Generalized anxiety disorder: Secondary | ICD-10-CM

## 2018-07-12 DIAGNOSIS — E1142 Type 2 diabetes mellitus with diabetic polyneuropathy: Secondary | ICD-10-CM

## 2018-07-12 DIAGNOSIS — Z23 Encounter for immunization: Secondary | ICD-10-CM

## 2018-07-12 DIAGNOSIS — I10 Essential (primary) hypertension: Secondary | ICD-10-CM | POA: Diagnosis not present

## 2018-07-12 DIAGNOSIS — E785 Hyperlipidemia, unspecified: Secondary | ICD-10-CM | POA: Diagnosis not present

## 2018-07-12 DIAGNOSIS — Z6828 Body mass index (BMI) 28.0-28.9, adult: Secondary | ICD-10-CM

## 2018-07-12 DIAGNOSIS — N183 Chronic kidney disease, stage 3 unspecified: Secondary | ICD-10-CM

## 2018-07-12 DIAGNOSIS — K219 Gastro-esophageal reflux disease without esophagitis: Secondary | ICD-10-CM

## 2018-07-12 DIAGNOSIS — R6889 Other general symptoms and signs: Secondary | ICD-10-CM | POA: Diagnosis not present

## 2018-07-12 DIAGNOSIS — C541 Malignant neoplasm of endometrium: Secondary | ICD-10-CM

## 2018-07-12 LAB — BAYER DCA HB A1C WAIVED: HB A1C (BAYER DCA - WAIVED): 7.3 % — ABNORMAL HIGH (ref <7.0)

## 2018-07-12 MED ORDER — LINAGLIPTIN 5 MG PO TABS: 5.0000 mg | ORAL_TABLET | Freq: Every day | ORAL | 1 refills | Status: DC

## 2018-07-12 MED ORDER — GABAPENTIN 100 MG PO CAPS: 100.0000 mg | ORAL_CAPSULE | Freq: Every day | ORAL | 1 refills | Status: DC

## 2018-07-12 MED ORDER — DEXLANSOPRAZOLE 60 MG PO CPDR: 1.0000 | DELAYED_RELEASE_CAPSULE | Freq: Every day | ORAL | 1 refills | Status: DC

## 2018-07-12 MED ORDER — ONETOUCH ULTRA BLUE VI STRP: ORAL_STRIP | 11 refills | Status: DC

## 2018-07-12 NOTE — Patient Instructions (Signed)
Bone Health Bones protect organs, store calcium, and anchor muscles. Good health habits, such as eating nutritious foods and exercising regularly, are important for maintaining healthy bones. They can also help to prevent a condition that causes bones to lose density and become weak and brittle (osteoporosis). Why is bone mass important? Bone mass refers to the amount of bone tissue that you have. The higher your bone mass, the stronger your bones. An important step toward having healthy bones throughout life is to have strong and dense bones during childhood. A young adult who has a high bone mass is more likely to have a high bone mass later in life. Bone mass at its greatest it is called peak bone mass. A large decline in bone mass occurs in older adults. In women, it occurs about the time of menopause. During this time, it is important to practice good health habits, because if more bone is lost than what is replaced, the bones will become less healthy and more likely to break (fracture). If you find that you have a low bone mass, you may be able to prevent osteoporosis or further bone loss by changing your diet and lifestyle. How can I find out if my bone mass is low? Bone mass can be measured with an X-ray test that is called a bone mineral density (BMD) test. This test is recommended for all women who are age 65 or older. It may also be recommended for men who are age 70 or older, or for people who are more likely to develop osteoporosis due to:  Having bones that break easily.  Having a long-term disease that weakens bones, such as kidney disease or rheumatoid arthritis.  Having menopause earlier than normal.  Taking medicine that weakens bones, such as steroids, thyroid hormones, or hormone treatment for breast cancer or prostate cancer.  Smoking.  Drinking three or more alcoholic drinks each day.  What are the nutritional recommendations for healthy bones? To have healthy bones, you  need to get enough of the right minerals and vitamins. Most nutrition experts recommend getting these nutrients from the foods that you eat. Nutritional recommendations vary from person to person. Ask your health care provider what is healthy for you. Here are some general guidelines. Calcium Recommendations Calcium is the most important (essential) mineral for bone health. Most people can get enough calcium from their diet, but supplements may be recommended for people who are at risk for osteoporosis. Good sources of calcium include:  Dairy products, such as low-fat or nonfat milk, cheese, and yogurt.  Dark green leafy vegetables, such as bok choy and broccoli.  Calcium-fortified foods, such as orange juice, cereal, bread, soy beverages, and tofu products.  Nuts, such as almonds.  Follow these recommended amounts for daily calcium intake:  Children, age 1?3: 700 mg.  Children, age 4?8: 1,000 mg.  Children, age 9?13: 1,300 mg.  Teens, age 14?18: 1,300 mg.  Adults, age 19?50: 1,000 mg.  Adults, age 51?70: ? Men: 1,000 mg. ? Women: 1,200 mg.  Adults, age 71 or older: 1,200 mg.  Pregnant and breastfeeding females: ? Teens: 1,300 mg. ? Adults: 1,000 mg.  Vitamin D Recommendations Vitamin D is the most essential vitamin for bone health. It helps the body to absorb calcium. Sunlight stimulates the skin to make vitamin D, so be sure to get enough sunlight. If you live in a cold climate or you do not get outside often, your health care provider may recommend that you take vitamin   D supplements. Good sources of vitamin D in your diet include:  Egg yolks.  Saltwater fish.  Milk and cereal fortified with vitamin D.  Follow these recommended amounts for daily vitamin D intake:  Children and teens, age 1?18: 600 international units.  Adults, age 50 or younger: 400-800 international units.  Adults, age 51 or older: 800-1,000 international units.  Other Nutrients Other nutrients  for bone health include:  Phosphorus. This mineral is found in meat, poultry, dairy foods, nuts, and legumes. The recommended daily intake for adult men and adult women is 700 mg.  Magnesium. This mineral is found in seeds, nuts, dark green vegetables, and legumes. The recommended daily intake for adult men is 400?420 mg. For adult women, it is 310?320 mg.  Vitamin K. This vitamin is found in green leafy vegetables. The recommended daily intake is 120 mg for adult men and 90 mg for adult women.  What type of physical activity is best for building and maintaining healthy bones? Weight-bearing and strength-building activities are important for building and maintaining peak bone mass. Weight-bearing activities cause muscles and bones to work against gravity. Strength-building activities increases muscle strength that supports bones. Weight-bearing and muscle-building activities include:  Walking and hiking.  Jogging and running.  Dancing.  Gym exercises.  Lifting weights.  Tennis and racquetball.  Climbing stairs.  Aerobics.  Adults should get at least 30 minutes of moderate physical activity on most days. Children should get at least 60 minutes of moderate physical activity on most days. Ask your health care provide what type of exercise is best for you. Where can I find more information? For more information, check out the following websites:  National Osteoporosis Foundation: http://nof.org/learn/basics  National Institutes of Health: http://www.niams.nih.gov/Health_Info/Bone/Bone_Health/bone_health_for_life.asp  This information is not intended to replace advice given to you by your health care provider. Make sure you discuss any questions you have with your health care provider. Document Released: 12/20/2003 Document Revised: 04/18/2016 Document Reviewed: 10/04/2014 Elsevier Interactive Patient Education  2018 [employer]  

## 2018-07-12 NOTE — Progress Notes (Signed)
Subjective:    Patient ID: Catherine Cooper, female    DOB: 1941-07-15, 77 y.o.   MRN: [id number]   Chief Complaint: medical management of chronic issues  HPI:  1. Essential hypertension  No c/o chest pain sob or headache. Does not check blood pressure at home. BP Readings from Last 3 Encounters:  04/06/18 130/75  02/16/18 (!) 104/59  12/28/17 135/66     2. Gastroesophageal reflux disease without esophagitis  Takes dexilant daily. Works well for symptoms.  3. Type 2 diabetes mellitus with diabetic polyneuropathy, without long-term current use of insulin ([place]) last hgba1c was 6.9%. She checks her blood sugars everyday and they run below 130 consistently.   4. Age-related osteoporosis without current pathological fracture  Last bone density test was 12/28/17 with t score was -2.7. She does do some weight bearing exercises. She is currrently not on a bisphos.  5. Endometrial cancer ([place])  Thi was about 3 years ago. Had hysterectomy and no further treatment was needed.  6. Chronic kidney disease, stage III (moderate) (HCC) we are currently just wacthing labs  7. GAD (generalized anxiety disorder)  She takes ativan prn when she gets really anxious  8. BMI 28.0-28.9,adult  No recent weight changes  9. Functional diarrhea  She uses imodium when she needs it.  10. Hyperlipidemia, unspecified hyperlipidemia type  Does not really watch diet.    Outpatient Encounter Medications as of 07/12/2018  Medication Sig  . ALPHAGAN P 0.1 % SOLN Place 1 drop into both eyes 2 (two) times daily.   Marland Kitchen aspirin 81 MG tablet Take 81 mg by mouth every evening.   . bifidobacterium infantis (ALIGN) capsule Take 1 capsule by mouth daily.  . calcium citrate-vitamin D (CITRACAL+D) 315-200 MG-UNIT tablet Take 1 tablet by mouth 2 (two) times daily.  . Cholecalciferol (VITAMIN D3) 2000 UNITS TABS Take 1 capsule by mouth daily.   Marland Kitchen dexlansoprazole (DEXILANT) 60 MG capsule Take 1 capsule (60 mg total) by mouth  daily.  . Dulaglutide (TRULICITY) 0.34 VQ/2.5ZD SOPN Inject 0.75 mg into the skin once a week.  ./ gabapentin (NEURONTIN) 10/ MG capsule Take 1 capsule (100 mg total) by mouth daily.  Marland Kitchen glipiZIDE (GLIPIZIDE XL) 10 MG 24 hr tablet Take 1 tablet (10 mg total) by mouth daily with breakfast.  . ibuprofen (ADVIL,MOTRIN) 800 MG tablet Take 1 tablet (800 mg total) by mouth every 6 (six) hours as needed for pain.  Marland Kitchen latanoprost (XALATAN) 0.005 % ophthalmic solution Place 1 drop into both eyes at bedtime.   Marland Kitchen linagliptin (TRADJENTA) 5 MG TABS tablet Take 1 tablet (5 mg total) by mouth at bedtime.  Marland Kitchen LORazepam (ATIVAN) 0.5 MG tablet Take 1 tablet (0.5 mg total) by mouth 2 (two) times daily as needed for anxiety or sleep.  . Multiple Vitamins-Minerals (PRESERVISION/LUTEIN PO) Take by mouth.  . ONE TOUCH ULTRA TEST test strip Test BS once daily and PRN dx.E11.9  . vitamin B-12 (CYANOCOBALAMIN) 1000 MCG tablet Take 1,000 mcg by mouth daily.         New complaints: None today  Social history: Lives with husband of many years. Mother in law died last year which has taken a lot of stress off the whole family.   Review of Systems  Constitutional: Negative for activity change and appetite change.  HENT: Negative.   Eyes: Negative for pain.  Respiratory: Negative for shortness of breath.   Cardiovascular: Negative for chest pain, palpitations and leg swelling.  Gastrointestinal: Negative for  abdominal pain.  Endocrine: Negative for polydipsia.  Genitourinary: Negative.   Skin: Negative for rash.  Neurological: Negative for dizziness, weakness and headaches.  Hematological: Does not bruise/bleed easily.  Psychiatric/Behavioral: Negative.   All other systems reviewed and are negative.      Objective:   Physical Exam  Constitutional: She is oriented to person, place, and time. She appears well-developed and well-nourished. No distress.  HENT:  Head: Normocephalic.  Nose: Nose normal.    Mouth/Throat: Oropharynx is clear and moist.  Eyes: Pupils are equal, round, and reactive to light. EOM are normal.  Neck: Normal range of motion. Neck supple. No JVD present. Carotid bruit is not present.  Cardiovascular: Normal rate, regular rhythm, normal heart sounds and intact distal pulses.  Pulmonary/Chest: Effort normal and breath sounds normal. No respiratory distress. She has no wheezes. She has no rales. She exhibits no tenderness.  Abdominal: Soft. Normal appearance, normal aorta and bowel sounds are normal. She exhibits no distension, no abdominal bruit, no pulsatile midline mass and no mass. There is no splenomegaly or hepatomegaly. There is no tenderness.  Musculoskeletal: Normal range of motion. She exhibits no edema.  Lymphadenopathy:    She has no cervical adenopathy.  Neurological: She is alert and oriented to person, place, and time. She has normal reflexes.  Skin: Skin is warm and dry.  Psychiatric: She has a normal mood and affect. Her behavior is normal. Judgment and thought content normal.  Nursing note and vitals reviewed.   BP 133/71   Pulse 87   Temp 97.7 F (36.5 C) (Oral)   Ht _0  (1.651 m)   Wt 163 lb (73.9 kg)   BMI 27.12 kg/m   hgba1c 7.3%    Assessment & Plan:  Catherine Cooper comes in today with chief complaint of Medical Management of Chronic Issues   Diagnosis and orders addressed:  1. Essential hypertension Low sodium diet - CMP14+EGFR  2. Gastroesophageal reflux disease without esophagitis Avoid spicy foods Do not eat 2 hours prior to bedtime - dexlansoprazole (DEXILANT) 60 MG capsule; Take 1 capsule (60 mg total) by mouth daily.  Dispense: 90 capsule; Refill: 1  3. Type 2 diabetes mellitus with diabetic polyneuropathy, without long-term current use of insulin (HCC) Stricter carb counting - Bayer DCA Hb A1c Waived - Anemia Profile B - gabapentin (NEURONTIN) 100 MG capsule; Take 1 capsule (100 mg total) by mouth daily.  Dispense:  90 capsule; Refill: 1 - linagliptin (TRADJENTA) 5 MG TABS tablet; Take 1 tablet (5 mg total) by mouth at bedtime.  Dispense: 90 tablet; Refill: 1 - ONE TOUCH ULTRA TEST test strip; Test BS once daily and PRN dx.E11.9  Dispense: 100 each; Refill: 11  4. Age-related osteoporosis without current pathological fracture Weight bearing exercise Ordered prolia  5. Endometrial cancer ([place])  6. Chronic kidney disease, stage III (moderate) (HCC) Labs pending  7. GAD (generalized anxiety disorder) Stress management  8. BMI 28.0-28.9,adult Discussed diet and exercise for person with BMI >25 Will recheck weight in 3-6 months   9. Functional diarrhea Imodium ad as needed  10. Hyperlipidemia, unspecified hyperlipidemia type Low fat diet - Lipid panel   Labs pending Health Maintenance reviewed Diet and exercise encouraged  Follow up plan: 3 months   Mary-Margaret Hassell Done, FNP

## 2018-07-13 DIAGNOSIS — E113492 Type 2 diabetes mellitus with severe nonproliferative diabetic retinopathy without macular edema, left eye: Secondary | ICD-10-CM | POA: Diagnosis not present

## 2018-07-13 DIAGNOSIS — H16102 Unspecified superficial keratitis, left eye: Secondary | ICD-10-CM | POA: Diagnosis not present

## 2018-07-13 DIAGNOSIS — H353122 Nonexudative age-related macular degeneration, left eye, intermediate dry stage: Secondary | ICD-10-CM | POA: Diagnosis not present

## 2018-07-13 DIAGNOSIS — E113551 Type 2 diabetes mellitus with stable proliferative diabetic retinopathy, right eye: Secondary | ICD-10-CM | POA: Diagnosis not present

## 2018-07-13 LAB — CMP14+EGFR
ALBUMIN: 4 g/dL (ref 3.5–4.8)
ALK PHOS: 84 IU/L (ref 39–117)
ALT: 13 IU/L (ref 0–32)
AST: 14 IU/L (ref 0–40)
Albumin/Globulin Ratio: 1.7 (ref 1.2–2.2)
BUN / CREAT RATIO: 14 (ref 12–28)
BUN: 14 mg/dL (ref 8–27)
Bilirubin Total: 0.3 mg/dL (ref 0.0–1.2)
CO2: 25 mmol/L (ref 20–29)
Calcium: 8.9 mg/dL (ref 8.7–10.3)
Chloride: 103 mmol/L (ref 96–106)
Creatinine, Ser: 1 mg/dL (ref 0.57–1.00)
GFR calc non Af Amer: 54 mL/min/{1.73_m2} — ABNORMAL LOW (ref >59)
GFR, EST AFRICAN AMERICAN: 62 mL/min/{1.73_m2} (ref >59)
GLOBULIN, TOTAL: 2.4 g/dL (ref 1.5–4.5)
Glucose: 211 mg/dL — ABNORMAL HIGH (ref 65–99)
Potassium: 4.5 mmol/L (ref 3.5–5.2)
SODIUM: 141 mmol/L (ref 134–144)
TOTAL PROTEIN: 6.4 g/dL (ref 6.0–8.5)

## 2018-07-13 LAB — ANEMIA PROFILE B
BASOS: 1 %
Basophils Absolute: 0.1 10*3/uL (ref 0.0–0.2)
EOS (ABSOLUTE): 0.3 10*3/uL (ref 0.0–0.4)
EOS: 6 %
FERRITIN: 165 ng/mL — AB (ref 15–150)
Folate: >20 ng/mL (ref >3.0)
HEMATOCRIT: 37.5 % (ref 34.0–46.6)
HEMOGLOBIN: 12.3 g/dL (ref 11.1–15.9)
IMMATURE GRANS (ABS): 0 10*3/uL (ref 0.0–0.1)
IMMATURE GRANULOCYTES: 0 %
IRON SATURATION: 29 % (ref 15–55)
Iron: 70 ug/dL (ref 27–139)
LYMPHS: 31 %
Lymphocytes Absolute: 1.8 10*3/uL (ref 0.7–3.1)
MCH: 30.8 pg (ref 26.6–33.0)
MCHC: 32.8 g/dL (ref 31.5–35.7)
MCV: 94 fL (ref 79–97)
MONOCYTES: 8 %
Monocytes Absolute: 0.5 10*3/uL (ref 0.1–0.9)
NEUTROS PCT: 54 %
Neutrophils Absolute: 3.1 10*3/uL (ref 1.4–7.0)
Platelets: 165 10*3/uL (ref 150–450)
RBC: 4 x10E6/uL (ref 3.77–5.28)
RDW: 11.8 % — ABNORMAL LOW (ref 12.3–15.4)
RETIC CT PCT: 1.5 % (ref 0.6–2.6)
TIBC: 239 ug/dL — AB (ref 250–450)
UIBC: 169 ug/dL (ref 118–369)
Vitamin B-12: 1238 pg/mL (ref 232–1245)
WBC: 5.8 10*3/uL (ref 3.4–10.8)

## 2018-07-13 LAB — HM DIABETES EYE EXAM

## 2018-07-13 LAB — LIPID PANEL
CHOL/HDL RATIO: 4 ratio (ref 0.0–4.4)
CHOLESTEROL TOTAL: 165 mg/dL (ref 100–199)
HDL: 41 mg/dL (ref >39)
LDL Calculated: 95 mg/dL (ref 0–99)
Triglycerides: 144 mg/dL (ref 0–149)
VLDL Cholesterol Cal: 29 mg/dL (ref 5–40)

## 2018-07-21 ENCOUNTER — Telehealth: Payer: Self-pay | Admitting: *Deleted

## 2018-07-21 NOTE — Telephone Encounter (Signed)
Insurance verification submitted months back and summary of benefits was received. Prior authorization for [employer] was submitted but response was not received.   Completed new PA on Cover My Meds today. Patient has a history of osteoporotic fracture and has taken alendronate in the past. Also has severe GERD. Most recent dexa scan showed a T Score of -2.7.   Will wait for PA response which may take up to 72 hours. The last summary of benefits should be accurate. It showed a copay amount of $30 for Prolia and also outlined a copay cost of $40 for the administration fee/office visit even if an office visit is not charged. It will cost the patient at least $40 and possible a total of $70 once the PA goes through.

## 2018-07-22 ENCOUNTER — Telehealth: Payer: Self-pay | Admitting: *Deleted

## 2018-07-22 MED ORDER — DENOSUMAB 60 MG/ML ~~LOC~~ SOSY: 60.0000 mg | PREFILLED_SYRINGE | Freq: Once | SUBCUTANEOUS | 0 refills | Status: DC

## 2018-07-22 MED ORDER — DENOSUMAB 60 MG/ML ~~LOC~~ SOSY: 60.0000 mg | PREFILLED_SYRINGE | Freq: Once | SUBCUTANEOUS | 0 refills | Status: AC

## 2018-07-22 NOTE — Telephone Encounter (Signed)
Rx and approval letter faxed to specialty pharmacy.

## 2018-07-22 NOTE — Telephone Encounter (Signed)
Prior authorization received  Valid 07/21/2018 to 07/21/2020 Must be sent through [facility] for coverage PA# [place] [id number]  Patient notified. Approval noticed requested that we fax a printed prescription along with the notice to [phone]. Advised that [facility] will call her to verify that she wants to fill this medication and then they will call our office to arrange shipment. Once we receive it in our office we will call her to schedule the appointment.

## 2018-08-05 NOTE — Telephone Encounter (Signed)
Prolia received and is in the fridge with a patient label.  Asked her to call back and schedule an appointment with the nurse for the injection.

## 2018-08-05 NOTE — Telephone Encounter (Signed)
Erroneous encounter

## 2018-08-10 ENCOUNTER — Telehealth: Payer: Self-pay | Admitting: Nurse Practitioner

## 2018-08-10 NOTE — Telephone Encounter (Signed)
Pt notified ok to proceed with Prolia inj

## 2018-08-11 ENCOUNTER — Ambulatory Visit (INDEPENDENT_AMBULATORY_CARE_PROVIDER_SITE_OTHER): Payer: Medicare Other | Admitting: *Deleted

## 2018-08-11 DIAGNOSIS — M810 Age-related osteoporosis without current pathological fracture: Secondary | ICD-10-CM

## 2018-08-11 MED ORDER — DENOSUMAB 60 MG/ML ~~LOC~~ SOSY
60.0000 mg | PREFILLED_SYRINGE | SUBCUTANEOUS | Status: DC
  Administered 2018-08-11: 60 mg via SUBCUTANEOUS

## 2018-08-11 NOTE — Progress Notes (Signed)
Pt given Prolia inj Tolerated well Pt supplied medication

## 2018-08-24 DIAGNOSIS — D229 Melanocytic nevi, unspecified: Secondary | ICD-10-CM | POA: Diagnosis not present

## 2018-08-24 DIAGNOSIS — B079 Viral wart, unspecified: Secondary | ICD-10-CM | POA: Diagnosis not present

## 2018-09-16 DIAGNOSIS — B351 Tinea unguium: Secondary | ICD-10-CM | POA: Diagnosis not present

## 2018-09-16 DIAGNOSIS — E1142 Type 2 diabetes mellitus with diabetic polyneuropathy: Secondary | ICD-10-CM | POA: Diagnosis not present

## 2018-09-16 DIAGNOSIS — L84 Corns and callosities: Secondary | ICD-10-CM | POA: Diagnosis not present

## 2018-09-16 DIAGNOSIS — M79676 Pain in unspecified toe(s): Secondary | ICD-10-CM | POA: Diagnosis not present

## 2018-09-28 ENCOUNTER — Other Ambulatory Visit: Payer: Self-pay | Admitting: Nurse Practitioner

## 2018-09-28 DIAGNOSIS — F411 Generalized anxiety disorder: Secondary | ICD-10-CM

## 2018-09-29 ENCOUNTER — Other Ambulatory Visit: Payer: Self-pay | Admitting: Nurse Practitioner

## 2018-09-29 DIAGNOSIS — E1142 Type 2 diabetes mellitus with diabetic polyneuropathy: Secondary | ICD-10-CM

## 2018-09-30 NOTE — Telephone Encounter (Signed)
OV 10/26/18 

## 2018-10-19 DIAGNOSIS — H353132 Nonexudative age-related macular degeneration, bilateral, intermediate dry stage: Secondary | ICD-10-CM | POA: Diagnosis not present

## 2018-10-19 DIAGNOSIS — H26492 Other secondary cataract, left eye: Secondary | ICD-10-CM | POA: Diagnosis not present

## 2018-10-19 DIAGNOSIS — H401221 Low-tension glaucoma, left eye, mild stage: Secondary | ICD-10-CM | POA: Diagnosis not present

## 2018-10-19 DIAGNOSIS — H401212 Low-tension glaucoma, right eye, moderate stage: Secondary | ICD-10-CM | POA: Diagnosis not present

## 2018-10-26 ENCOUNTER — Ambulatory Visit (INDEPENDENT_AMBULATORY_CARE_PROVIDER_SITE_OTHER): Payer: Medicare Other | Admitting: Nurse Practitioner

## 2018-10-26 ENCOUNTER — Encounter: Payer: Self-pay | Admitting: Nurse Practitioner

## 2018-10-26 VITALS — BP 120/62 | HR 97 | Temp 97.7°F | Ht 65.0 in | Wt 160.0 lb

## 2018-10-26 DIAGNOSIS — R059 Cough, unspecified: Secondary | ICD-10-CM

## 2018-10-26 DIAGNOSIS — F411 Generalized anxiety disorder: Secondary | ICD-10-CM

## 2018-10-26 DIAGNOSIS — E1142 Type 2 diabetes mellitus with diabetic polyneuropathy: Secondary | ICD-10-CM | POA: Diagnosis not present

## 2018-10-26 DIAGNOSIS — K219 Gastro-esophageal reflux disease without esophagitis: Secondary | ICD-10-CM | POA: Diagnosis not present

## 2018-10-26 DIAGNOSIS — E785 Hyperlipidemia, unspecified: Secondary | ICD-10-CM

## 2018-10-26 DIAGNOSIS — R05 Cough: Secondary | ICD-10-CM

## 2018-10-26 DIAGNOSIS — N183 Chronic kidney disease, stage 3 unspecified: Secondary | ICD-10-CM

## 2018-10-26 DIAGNOSIS — I10 Essential (primary) hypertension: Secondary | ICD-10-CM | POA: Diagnosis not present

## 2018-10-26 DIAGNOSIS — C541 Malignant neoplasm of endometrium: Secondary | ICD-10-CM

## 2018-10-26 DIAGNOSIS — Z6828 Body mass index (BMI) 28.0-28.9, adult: Secondary | ICD-10-CM

## 2018-10-26 DIAGNOSIS — M810 Age-related osteoporosis without current pathological fracture: Secondary | ICD-10-CM

## 2018-10-26 LAB — CMP14+EGFR
ALBUMIN: 4 g/dL (ref 3.5–4.8)
ALK PHOS: 76 IU/L (ref 39–117)
ALT: 16 IU/L (ref 0–32)
AST: 16 IU/L (ref 0–40)
Albumin/Globulin Ratio: 1.7 (ref 1.2–2.2)
BUN / CREAT RATIO: 11 — AB (ref 12–28)
BUN: 11 mg/dL (ref 8–27)
Bilirubin Total: 0.4 mg/dL (ref 0.0–1.2)
CO2: 26 mmol/L (ref 20–29)
CREATININE: 1.04 mg/dL — AB (ref 0.57–1.00)
Calcium: 9 mg/dL (ref 8.7–10.3)
Chloride: 102 mmol/L (ref 96–106)
GFR calc Af Amer: 59 mL/min/{1.73_m2} — ABNORMAL LOW (ref >59)
GFR calc non Af Amer: 52 mL/min/{1.73_m2} — ABNORMAL LOW (ref >59)
Globulin, Total: 2.4 g/dL (ref 1.5–4.5)
Glucose: 209 mg/dL — ABNORMAL HIGH (ref 65–99)
Potassium: 4.5 mmol/L (ref 3.5–5.2)
SODIUM: 141 mmol/L (ref 134–144)
Total Protein: 6.4 g/dL (ref 6.0–8.5)

## 2018-10-26 LAB — LIPID PANEL
CHOL/HDL RATIO: 3.7 ratio (ref 0.0–4.4)
CHOLESTEROL TOTAL: 166 mg/dL (ref 100–199)
HDL: 45 mg/dL (ref >39)
LDL CALC: 84 mg/dL (ref 0–99)
Triglycerides: 185 mg/dL — ABNORMAL HIGH (ref 0–149)
VLDL Cholesterol Cal: 37 mg/dL (ref 5–40)

## 2018-10-26 LAB — BAYER DCA HB A1C WAIVED: HB A1C (BAYER DCA - WAIVED): 7 % — ABNORMAL HIGH (ref <7.0)

## 2018-10-26 MED ORDER — PREDNISONE 20 MG PO TABS: ORAL_TABLET | ORAL | 0 refills | Status: DC

## 2018-10-26 MED ORDER — BENZONATATE 100 MG PO CAPS: 100.0000 mg | ORAL_CAPSULE | Freq: Three times a day (TID) | ORAL | 0 refills | Status: DC | PRN

## 2018-10-26 MED ORDER — GABAPENTIN 100 MG PO CAPS: 100.0000 mg | ORAL_CAPSULE | Freq: Every day | ORAL | 1 refills | Status: DC

## 2018-10-26 MED ORDER — LORAZEPAM 0.5 MG PO TABS: 0.5000 mg | ORAL_TABLET | Freq: Two times a day (BID) | ORAL | 0 refills | Status: DC | PRN

## 2018-10-26 MED ORDER — DEXLANSOPRAZOLE 60 MG PO CPDR: 1.0000 | DELAYED_RELEASE_CAPSULE | Freq: Every day | ORAL | 1 refills | Status: DC

## 2018-10-26 MED ORDER — LINAGLIPTIN 5 MG PO TABS: 5.0000 mg | ORAL_TABLET | Freq: Every day | ORAL | 1 refills | Status: DC

## 2018-10-26 MED ORDER — GLIPIZIDE ER 10 MG PO TB24: 10.0000 mg | ORAL_TABLET | Freq: Every day | ORAL | 1 refills | Status: DC

## 2018-10-26 NOTE — Progress Notes (Signed)
Subjective:    Patient ID: Catherine Cooper, female    DOB: Oct 24, 1940, 78 y.o.   MRN: [id number]   Chief Complaint: medical management of chronic issues  HPI:  1. Essential hypertension  No c/o chest pain, sob or headache. Does ot check blood pressure at home. BP Readings from Last 3 Encounters:  07/12/18 133/71  04/06/18 130/75  02/16/18 (!) 104/59     2. Gastroesophageal reflux disease without esophagitis  Takes dexilant and still has occasional symptoms.  3. Hyperlipidemia, unspecified hyperlipidemia type  Does not really watch diet and does no dedicated exercise.  4. GAD (generalized anxiety disorder)  Takes ativan bid  5. Chronic kidney disease, stage III (moderate) (HCC)  We are currently just watching labs  6. Type 2 diabetes mellitus with diabetic polyneuropathy, without long-term current use of insulin ([place]) last hgba1c was 7.3%. her blood sugars run around 130 fasting in mornings  7. Age-related osteoporosis without current pathological fracture  Last dexascan was done with t score of -2.7. is currently on prolia injections  8. Endometrial cancer [facility])  Was released bu oncology in 2016  9. BMI 28.0-28.9,adult  No recent weight changes.    Outpatient Encounter Medications as of 10/26/2018  Medication Sig  . ALPHAGAN P 0.1 % SOLN Place 1 drop into both eyes 2 (two) times daily.   Marland Kitchen aspirin 81 MG tablet Take 81 mg by mouth every evening.   . bifidobacterium infantis (ALIGN) capsule Take 1 capsule by mouth daily.  . calcium citrate-vitamin D (CITRACAL+D) 315-200 MG-UNIT tablet Take 1 tablet by mouth 2 (two) times daily.  . Cholecalciferol (VITAMIN D3) 2000 UNITS TABS Take 1 capsule by mouth daily.   Marland Kitchen dexlansoprazole (DEXILANT) 60 MG capsule Take 1 capsule (60 mg total) by mouth daily.  . Dulaglutide (TRULICITY) 6.72 CN/4.7SJ SOPN Inject 0.75 mg into the skin once a week.  . gabapentin (NEURONTIN) 100 MG capsule Take 1 capsule (100 mg total) by mouth daily.  Marland Kitchen  glipiZIDE (GLUCOTROL XL) 10 MG 24 hr tablet TAKE 1 TABLET DAILY WITH   BREAKFAST  . ibuprofen (ADVIL,MOTRIN) 800 MG tablet Take 1 tablet (800 mg total) by mouth every 6 (six) hours as needed for pain.  Marland Kitchen latanoprost (XALATAN) 0.005 % ophthalmic solution Place 1 drop into both eyes at bedtime.   Marland Kitchen linagliptin (TRADJENTA) 5 MG TABS tablet Take 1 tablet (5 mg total) by mouth at bedtime.  Marland Kitchen LORazepam (ATIVAN) 0.5 MG tablet Take 1 tablet (0.5 mg total) by mouth 2 (two) times daily as needed for anxiety or sleep.  . Multiple Vitamins-Minerals (PRESERVISION/LUTEIN PO) Take by mouth.  . ONE TOUCH ULTRA TEST test strip Test BS once daily and PRN dx.E11.9  . vitamin B-12 (CYANOCOBALAMIN) 1000 MCG tablet Take 1,000 mcg by mouth daily.         New complaints: Has had a bad cough for 2 days. No fever or congestion.  Social history: Husband still living. Both of them are retired.  Review of Systems  Constitutional: Negative for activity change, appetite change, chills and fever.  HENT: Positive for ear pain and rhinorrhea. Negative for congestion.   Eyes: Negative for pain.  Respiratory: Positive for cough (nonproductive). Negative for shortness of breath.   Cardiovascular: Negative for chest pain, palpitations and leg swelling.  Gastrointestinal: Negative for abdominal pain.  Endocrine: Negative for polydipsia.  Genitourinary: Negative.   Skin: Negative for rash.  Neurological: Negative for dizziness, weakness and headaches.  Hematological: Does not bruise/bleed easily.  Psychiatric/Behavioral: Negative.   All other systems reviewed and are negative.      Objective:   Physical Exam Vitals signs and nursing note reviewed.  Constitutional:      General: She is not in acute distress.    Appearance: Normal appearance. She is well-developed.  HENT:     Head: Normocephalic.     Nose: Nose normal.  Eyes:     Pupils: Pupils are equal, round, and reactive to light.  Neck:      Musculoskeletal: Normal range of motion and neck supple.     Vascular: No carotid bruit or JVD.  Cardiovascular:     Rate and Rhythm: Normal rate and regular rhythm.     Heart sounds: Normal heart sounds.  Pulmonary:     Effort: Pulmonary effort is normal. No respiratory distress.     Breath sounds: Normal breath sounds. No wheezing or rales.  Chest:     Chest wall: No tenderness.  Abdominal:     General: Bowel sounds are normal. There is no distension or abdominal bruit.     Palpations: Abdomen is soft. There is no hepatomegaly, splenomegaly, mass or pulsatile mass.     Tenderness: There is no abdominal tenderness.  Musculoskeletal: Normal range of motion.  Lymphadenopathy:     Cervical: No cervical adenopathy.  Skin:    General: Skin is warm and dry.  Neurological:     Mental Status: She is alert and oriented to person, place, and time.     Deep Tendon Reflexes: Reflexes are normal and symmetric.  Psychiatric:        Behavior: Behavior normal.        Thought Content: Thought content normal.        Judgment: Judgment normal.    BP 120/62   Pulse 97   Temp 97.7 F (36.5 C) (Oral)   Ht 5' 5"  (1.651 m)   Wt 160 lb (72.6 kg)   BMI 26.63 kg/m   hgba1c 7.0       Assessment & Plan:  Catherine Cooper comes in today with chief complaint of Medical Management of Chronic Issues ([place])   Diagnosis and orders addressed:  1. Essential hypertension Low sodium diet - CMP14+EGFR  2. Gastroesophageal reflux disease without esophagitis Avoid spicy foods Do not eat 2 hours prior to bedtime - dexlansoprazole (DEXILANT) 60 MG capsule; Take 1 capsule (60 mg total) by mouth daily.  Dispense: 90 capsule; Refill: 1  3. Hyperlipidemia, unspecified hyperlipidemia type Low fat diet - Lipid panel  4. GAD (generalized anxiety disorder) Stress management - LORazepam (ATIVAN) 0.5 MG tablet; Take 1 tablet (0.5 mg total) by mouth 2 (two) times daily as needed for anxiety or sleep.   Dispense: 60 tablet; Refill: 0  5. Chronic kidney disease, stage III (moderate) (HCC) Labs pending  6. Type 2 diabetes mellitus with diabetic polyneuropathy, without long-term current use of insulin (HCC) Continue t owatch carbs in diet - Bayer DCA Hb A1c Waived - Microalbumin / creatinine urine ratio - glipiZIDE (GLUCOTROL XL) 10 MG 24 hr tablet; Take 1 tablet (10 mg total) by mouth daily with breakfast.  Dispense: 90 tablet; Refill: 1 - linagliptin (TRADJENTA) 5 MG TABS tablet; Take 1 tablet (5 mg total) by mouth at bedtime.  Dispense: 90 tablet; Refill: 1 - gabapentin (NEURONTIN) 100 MG capsule; Take 1 capsule (100 mg total) by mouth daily.  Dispense: 90 capsule; Refill: 1  7. Age-related osteoporosis without current pathological fracture Weight bearing exercises  8.  Endometrial cancer ([place])  9. BMI 28.0-28.9,adult Discussed diet and exercise for person with BMI >25 Will recheck weight in 3-6 months  10. Cough - prednisone 56m 2 po qd for 5 days- will increase blood sugars  Labs pending Health Maintenance reviewed Diet and exercise encouraged  Follow up plan: 3 months   Mary-Margaret MHassell Done FNP

## 2018-10-26 NOTE — Patient Instructions (Signed)

## 2018-10-27 LAB — MICROALBUMIN / CREATININE URINE RATIO
CREATININE, UR: 191.3 mg/dL
MICROALBUM., U, RANDOM: 25.1 ug/mL
Microalb/Creat Ratio: 13.1 mg/g creat (ref 0.0–30.0)

## 2018-11-08 ENCOUNTER — Telehealth: Payer: Self-pay | Admitting: Nurse Practitioner

## 2018-11-08 NOTE — Telephone Encounter (Signed)
Ok to give her [place] samples

## 2018-11-08 NOTE — Telephone Encounter (Signed)
We do not have any samples 

## 2018-11-08 NOTE — Telephone Encounter (Signed)
Patient on  tradjenta 5mg  and  is going to cost too much for her this year. They will however cover Januivia. Patient is calling to see if she can try samples of the [place] before its called in. Please advise

## 2018-11-10 ENCOUNTER — Telehealth: Payer: Self-pay | Admitting: Nurse Practitioner

## 2018-11-10 NOTE — Telephone Encounter (Signed)
We still dont have any samples

## 2018-11-10 NOTE — Telephone Encounter (Signed)
Pt aware.

## 2018-11-10 NOTE — Telephone Encounter (Signed)
Has this patient been started on samples of januvia?

## 2018-11-25 DIAGNOSIS — M79676 Pain in unspecified toe(s): Secondary | ICD-10-CM | POA: Diagnosis not present

## 2018-11-25 DIAGNOSIS — B351 Tinea unguium: Secondary | ICD-10-CM | POA: Diagnosis not present

## 2018-11-25 DIAGNOSIS — L84 Corns and callosities: Secondary | ICD-10-CM | POA: Diagnosis not present

## 2018-11-25 DIAGNOSIS — E1142 Type 2 diabetes mellitus with diabetic polyneuropathy: Secondary | ICD-10-CM | POA: Diagnosis not present

## 2018-12-13 ENCOUNTER — Telehealth: Payer: Self-pay | Admitting: Nurse Practitioner

## 2018-12-13 NOTE — Telephone Encounter (Signed)
Patient has been on [place] in the past and she said it caused nausea and vomiting.

## 2018-12-14 NOTE — Telephone Encounter (Signed)
Patient is willing to try Januvia again

## 2018-12-14 NOTE — Telephone Encounter (Signed)
Out of januvia samples at this time

## 2018-12-14 NOTE — Telephone Encounter (Signed)
pleasegive her 100mg  samples for her to try before we send in rx

## 2018-12-16 MED ORDER — SITAGLIPTIN PHOSPHATE 100 MG PO TABS: 100.0000 mg | ORAL_TABLET | Freq: Every day | ORAL | 1 refills | Status: DC

## 2018-12-16 NOTE — Addendum Note (Signed)
Addended by: Chevis Pretty on: 12/16/2018 12:09 PM   Modules accepted: Orders

## 2019-01-27 ENCOUNTER — Encounter: Payer: Self-pay | Admitting: Nurse Practitioner

## 2019-01-27 ENCOUNTER — Other Ambulatory Visit: Payer: Self-pay

## 2019-01-27 ENCOUNTER — Ambulatory Visit (INDEPENDENT_AMBULATORY_CARE_PROVIDER_SITE_OTHER): Payer: Medicare Other | Admitting: Nurse Practitioner

## 2019-01-27 ENCOUNTER — Other Ambulatory Visit: Payer: Self-pay | Admitting: Nurse Practitioner

## 2019-01-27 DIAGNOSIS — I10 Essential (primary) hypertension: Secondary | ICD-10-CM | POA: Diagnosis not present

## 2019-01-27 DIAGNOSIS — K219 Gastro-esophageal reflux disease without esophagitis: Secondary | ICD-10-CM

## 2019-01-27 DIAGNOSIS — E1142 Type 2 diabetes mellitus with diabetic polyneuropathy: Secondary | ICD-10-CM | POA: Diagnosis not present

## 2019-01-27 DIAGNOSIS — N183 Chronic kidney disease, stage 3 unspecified: Secondary | ICD-10-CM

## 2019-01-27 DIAGNOSIS — E785 Hyperlipidemia, unspecified: Secondary | ICD-10-CM

## 2019-01-27 DIAGNOSIS — F411 Generalized anxiety disorder: Secondary | ICD-10-CM

## 2019-01-27 DIAGNOSIS — Z6828 Body mass index (BMI) 28.0-28.9, adult: Secondary | ICD-10-CM

## 2019-01-27 DIAGNOSIS — M810 Age-related osteoporosis without current pathological fracture: Secondary | ICD-10-CM | POA: Diagnosis not present

## 2019-01-27 MED ORDER — GLIPIZIDE ER 10 MG PO TB24: 10.0000 mg | ORAL_TABLET | Freq: Every day | ORAL | 1 refills | Status: DC

## 2019-01-27 MED ORDER — GABAPENTIN 100 MG PO CAPS: 100.0000 mg | ORAL_CAPSULE | Freq: Every day | ORAL | 1 refills | Status: DC

## 2019-01-27 MED ORDER — DEXLANSOPRAZOLE 60 MG PO CPDR: 1.0000 | DELAYED_RELEASE_CAPSULE | Freq: Every day | ORAL | 1 refills | Status: DC

## 2019-01-27 NOTE — Progress Notes (Signed)
Patient ID: Catherine Cooper, female   DOB: 09-10-1941, 78 y.o.   MRN: [id number]    Virtual Visit via telephone Note  I connected with Catherine Cooper on 01/27/19 at 8:30AM by telephone and verified that I am speaking with the correct person using two identifiers. Catherine Cooper is currently located at home and her husband is currently with her during visit. The provider, Mary-Margaret Hassell Done, FNP is located in their office at time of visit.  I discussed the limitations, risks, security and privacy concerns of performing an evaluation and management service by telephone and the availability of in person appointments. I also discussed with the patient that there may be a patient responsible charge related to this service. The patient expressed understanding and agreed to proceed.   History and Present Illness:   Chief Complaint: medical management of chronic issues   HPI:  1. Essential hypertension No c/o chest pain, sob or headache. Does not check blood pressure at home. BP Readings from Last 3 Encounters:  10/26/18 120/62  07/12/18 133/71  04/06/18 130/75     2. Hyperlipidemia, unspecified hyperlipidemia type Does not watch diet very offten and does very little exercise.  3. Gastroesophageal reflux disease without esophagitis Is on  dexilant and is doing well.  4. Type 2 diabetes mellitus with diabetic polyneuropathy, without long-term current use of insulin (HCC) last HGBA1c was 7.0. her fasting blood sugars have been running around 120-140. She is now on [place] because insurance would not pay for tradjenta and it does not seem to be working as well.   5. Age-related osteoporosis without current pathological fracture Last dexascan was done on 12/28/17 with t score of -2.7. she is now on prolia injections without any problems.  6. GAD (generalized anxiety disorder) Is on ativan prn and hs needed more since quarantine has started.  7. BMI 28.0-28.9,adult No recent weight  changes  8. Chronic kidney disease, stage III (moderate) (HCC) Her last creatine1.04    Outpatient Encounter Medications as of 01/27/2019  Medication Sig  . ALPHAGAN P 0.1 % SOLN Place 1 drop into both eyes 2 (two) times daily.   Marland Kitchen aspirin 81 MG tablet Take 81 mg by mouth every evening.   . benzonatate (TESSALON PERLES) 100 MG capsule Take 1 capsule (100 mg total) by mouth 3 (three) times daily as needed for cough.  . bifidobacterium infantis (ALIGN) capsule Take 1 capsule by mouth daily.  . calcium citrate-vitamin D (CITRACAL+D) 315-200 MG-UNIT tablet Take 1 tablet by mouth 2 (two) times daily.  . Cholecalciferol (VITAMIN D3) 2000 UNITS TABS Take 1 capsule by mouth daily.   Marland Kitchen dexlansoprazole (DEXILANT) 60 MG capsule Take 1 capsule (60 mg total) by mouth daily.  . Dulaglutide (TRULICITY) 1.51 VO/1.6WV SOPN Inject 0.75 mg into the skin once a week.  . gabapentin (NEURONTIN) 100 MG capsule Take 1 capsule (100 mg total) by mouth daily.  Marland Kitchen glipiZIDE (GLUCOTROL XL) 10 MG 24 hr tablet Take 1 tablet (10 mg total) by mouth daily with breakfast.  . ibuprofen (ADVIL,MOTRIN) 800 MG tablet Take 1 tablet (800 mg total) by mouth every 6 (six) hours as needed for pain.  Marland Kitchen latanoprost (XALATAN) 0.005 % ophthalmic solution Place 1 drop into both eyes at bedtime.   Marland Kitchen LORazepam (ATIVAN) 0.5 MG tablet Take 1 tablet (0.5 mg total) by mouth 2 (two) times daily as needed for anxiety or sleep.  . Multiple Vitamins-Minerals (PRESERVISION/LUTEIN PO) Take by mouth.  . ONE TOUCH ULTRA TEST  test strip Test BS once daily and PRN dx.E11.9  . predniSONE (DELTASONE) 20 MG tablet 2 po at sametime daily for 5 days  . sitaGLIPtin (JANUVIA) 100 MG tablet Take 1 tablet (100 mg total) by mouth daily.  . vitamin B-12 (CYANOCOBALAMIN) 1000 MCG tablet Take 1,000 mcg by mouth daily.       New complaints: None today  Social history: Lives with her husband. Their boxer died in December 09, 2022       Review of Systems   Constitutional: Negative for diaphoresis and weight loss.  Eyes: Negative for blurred vision, double vision and pain.  Respiratory: Negative for shortness of breath.   Cardiovascular: Negative for chest pain, palpitations, orthopnea and leg swelling.  Gastrointestinal: Negative for abdominal pain.  Skin: Negative for rash.  Neurological: Negative for dizziness, sensory change, loss of consciousness, weakness and headaches.  Endo/Heme/Allergies: Negative for polydipsia. Does not bruise/bleed easily.  Psychiatric/Behavioral: Negative for memory loss. The patient does not have insomnia.   All other systems reviewed and are negative.    Observations/Objective: Alert and oriented- answers al questions qppropriately No distressnoted  Assessment and Plan: Catherine Cooper comes in today with chief complaint of Medical Management of Chronic Issues   Diagnosis and orders addressed:  1. Essential hypertension Low sodium diet  2. Hyperlipidemia, unspecified hyperlipidemia type Low fat diet  3. Gastroesophageal reflux disease without esophagitis Avoid spicy foods Do not eat 2 hours prior to bedtime - dexlansoprazole (DEXILANT) 60 MG capsule; Take 1 capsule (60 mg total) by mouth daily.  Dispense: 90 capsule; Refill: 1  4. Type 2 diabetes mellitus with diabetic polyneuropathy, without long-term current use of insulin (HCC) Continue to watch carbsin diet - glipiZIDE (GLUCOTROL XL) 10 MG 24 hr tablet; Take 1 tablet (10 mg total) by mouth daily with breakfast.  Dispense: 90 tablet; Refill: 1 - gabapentin (NEURONTIN) 100 MG capsule; Take 1 capsule (100 mg total) by mouth daily.  Dispense: 90 capsule; Refill: 1  5. Age-related osteoporosis without current pathological fracture Weight bearing exercise  6. GAD (generalized anxiety disorder) Stress management  7. BMI 28.0-28.9,adult Discussed diet and exercise for person with BMI >25 Will recheck weight in 3-6 months  8. Chronic kidney  disease, stage III (moderate) ([place]) Labs wil be drawn when can be done   previous lab results reviewed Health Maintenance reviewed Diet and exercise encouraged  Follow up plan: 3 months      I discussed the assessment and treatment plan with the patient. The patient was provided an opportunity to ask questions and all were answered. The patient agreed with the plan and demonstrated an understanding of the instructions.   The patient was advised to call back or seek an in-person evaluation if the symptoms worsen or if the condition fails to improve as anticipated.  The above assessment and management plan was discussed with the patient. The patient verbalized understanding of and has agreed to the management plan. Patient is aware to call the clinic if symptoms persist or worsen. Patient is aware when to return to the clinic for a follow-up visit. Patient educated on when it is appropriate to go to the emergency department.    I provided 15 minutes of non-face-to-face time during this encounter.    Mary-Margaret Hassell Done, FNP

## 2019-02-03 DIAGNOSIS — B351 Tinea unguium: Secondary | ICD-10-CM | POA: Diagnosis not present

## 2019-02-03 DIAGNOSIS — L84 Corns and callosities: Secondary | ICD-10-CM | POA: Diagnosis not present

## 2019-02-03 DIAGNOSIS — M79676 Pain in unspecified toe(s): Secondary | ICD-10-CM | POA: Diagnosis not present

## 2019-02-03 DIAGNOSIS — E1142 Type 2 diabetes mellitus with diabetic polyneuropathy: Secondary | ICD-10-CM | POA: Diagnosis not present

## 2019-02-04 ENCOUNTER — Telehealth: Payer: Self-pay | Admitting: *Deleted

## 2019-02-04 NOTE — Telephone Encounter (Signed)
PROLIA: Summary of Benefits Interpretation  February 04, 2019     Purchase Information  Last purchase location: [place] . Primary: May Medicare Complete  . Secondary: [employer] [place]    Summary of Benefits  . Received on: 02/01/2019 . Estimated Cost to Patient: $0 . Prior authorization required: Yes (If Yes, submit on Cover My Meds or refer to the SOB for online form location) o Auth or Reference #: [id number] o Approval valid from 03/12/2018 to 03/12/2019  . Physician Purchase Covered: No  . Specialty Pharmacy Covered:Yes  o [facility]    Patient notified of coverage at 100% through specialty pharmacy.  Will notify her when we [facility] from [facility].      Kaitlan Bin M   02/04/2019 [place] [phone]

## 2019-02-14 DIAGNOSIS — H353122 Nonexudative age-related macular degeneration, left eye, intermediate dry stage: Secondary | ICD-10-CM | POA: Diagnosis not present

## 2019-02-14 DIAGNOSIS — E113492 Type 2 diabetes mellitus with severe nonproliferative diabetic retinopathy without macular edema, left eye: Secondary | ICD-10-CM | POA: Diagnosis not present

## 2019-02-14 DIAGNOSIS — E113551 Type 2 diabetes mellitus with stable proliferative diabetic retinopathy, right eye: Secondary | ICD-10-CM | POA: Diagnosis not present

## 2019-02-14 DIAGNOSIS — H35363 Drusen (degenerative) of macula, bilateral: Secondary | ICD-10-CM | POA: Diagnosis not present

## 2019-02-15 ENCOUNTER — Ambulatory Visit (INDEPENDENT_AMBULATORY_CARE_PROVIDER_SITE_OTHER): Payer: Medicare Other | Admitting: *Deleted

## 2019-02-15 ENCOUNTER — Other Ambulatory Visit: Payer: Self-pay

## 2019-02-15 DIAGNOSIS — M810 Age-related osteoporosis without current pathological fracture: Secondary | ICD-10-CM

## 2019-02-15 MED ORDER — DENOSUMAB 60 MG/ML ~~LOC~~ SOSY
60.0000 mg | PREFILLED_SYRINGE | SUBCUTANEOUS | Status: AC
  Administered 2019-02-15 – 2019-08-31 (×2): 60 mg via SUBCUTANEOUS

## 2019-02-15 NOTE — Progress Notes (Signed)
Pt given Prolia inj Tolerated well Pt supplied

## 2019-03-16 DIAGNOSIS — H401212 Low-tension glaucoma, right eye, moderate stage: Secondary | ICD-10-CM | POA: Diagnosis not present

## 2019-03-16 DIAGNOSIS — H401221 Low-tension glaucoma, left eye, mild stage: Secondary | ICD-10-CM | POA: Diagnosis not present

## 2019-03-16 DIAGNOSIS — H1851 Endothelial corneal dystrophy: Secondary | ICD-10-CM | POA: Diagnosis not present

## 2019-03-16 DIAGNOSIS — B0233 Zoster keratitis: Secondary | ICD-10-CM | POA: Diagnosis not present

## 2019-03-16 LAB — HM DIABETES EYE EXAM

## 2019-04-05 ENCOUNTER — Ambulatory Visit: Payer: Medicare Other

## 2019-04-13 ENCOUNTER — Ambulatory Visit: Payer: Medicare Other

## 2019-04-14 ENCOUNTER — Other Ambulatory Visit: Payer: Self-pay | Admitting: Nurse Practitioner

## 2019-04-14 DIAGNOSIS — L97511 Non-pressure chronic ulcer of other part of right foot limited to breakdown of skin: Secondary | ICD-10-CM | POA: Diagnosis not present

## 2019-04-14 DIAGNOSIS — E1142 Type 2 diabetes mellitus with diabetic polyneuropathy: Secondary | ICD-10-CM

## 2019-04-28 DIAGNOSIS — E1142 Type 2 diabetes mellitus with diabetic polyneuropathy: Secondary | ICD-10-CM | POA: Diagnosis not present

## 2019-04-28 DIAGNOSIS — L97511 Non-pressure chronic ulcer of other part of right foot limited to breakdown of skin: Secondary | ICD-10-CM | POA: Diagnosis not present

## 2019-04-29 ENCOUNTER — Ambulatory Visit: Payer: Medicare Other

## 2019-05-02 ENCOUNTER — Ambulatory Visit (INDEPENDENT_AMBULATORY_CARE_PROVIDER_SITE_OTHER): Payer: Medicare Other | Admitting: Nurse Practitioner

## 2019-05-02 ENCOUNTER — Encounter: Payer: Self-pay | Admitting: Nurse Practitioner

## 2019-05-02 DIAGNOSIS — M810 Age-related osteoporosis without current pathological fracture: Secondary | ICD-10-CM

## 2019-05-02 DIAGNOSIS — F411 Generalized anxiety disorder: Secondary | ICD-10-CM

## 2019-05-02 DIAGNOSIS — E1142 Type 2 diabetes mellitus with diabetic polyneuropathy: Secondary | ICD-10-CM | POA: Diagnosis not present

## 2019-05-02 DIAGNOSIS — K219 Gastro-esophageal reflux disease without esophagitis: Secondary | ICD-10-CM

## 2019-05-02 DIAGNOSIS — N183 Chronic kidney disease, stage 3 unspecified: Secondary | ICD-10-CM

## 2019-05-02 DIAGNOSIS — I10 Essential (primary) hypertension: Secondary | ICD-10-CM

## 2019-05-02 DIAGNOSIS — E782 Mixed hyperlipidemia: Secondary | ICD-10-CM | POA: Diagnosis not present

## 2019-05-02 DIAGNOSIS — Z6828 Body mass index (BMI) 28.0-28.9, adult: Secondary | ICD-10-CM

## 2019-05-02 MED ORDER — GABAPENTIN 100 MG PO CAPS: 100.0000 mg | ORAL_CAPSULE | Freq: Every day | ORAL | 1 refills | Status: DC

## 2019-05-02 MED ORDER — TRULICITY 0.75 MG/0.5ML ~~LOC~~ SOAJ: SUBCUTANEOUS | 5 refills | Status: DC

## 2019-05-02 MED ORDER — GLIPIZIDE ER 10 MG PO TB24: 10.0000 mg | ORAL_TABLET | Freq: Every day | ORAL | 1 refills | Status: DC

## 2019-05-02 MED ORDER — DEXILANT 60 MG PO CPDR: 1.0000 | DELAYED_RELEASE_CAPSULE | Freq: Every day | ORAL | 1 refills | Status: DC

## 2019-05-02 NOTE — Progress Notes (Signed)
Patient ID: Catherine Cooper, female   DOB: [birth year], 78 y.o.   MRN: [id number]    Virtual Visit via telephone Note  I connected with Catherine Cooper on 05/02/19 at 8:30 by telephone and verified that I am speaking with the correct person using two identifiers. Catherine Cooper is currently located at home  and no one is currently with her during visit. The provider, Mary-Margaret Hassell Done, FNP is located in their office at time of visit.  I discussed the limitations, risks, security and privacy concerns of performing an evaluation and management service by telephone and the availability of in person appointments. I also discussed with the patient that there may be a patient responsible charge related to this service. The patient expressed understanding and agreed to proceed.   History and Present Illness:   Chief Complaint: Medical Management of Chronic Issues    HPI:  1. Essential hypertension No c/o chest pain, sob or headache. Does not check blood pressure at home. BP Readings from Last 3 Encounters:  10/26/18 120/62  07/12/18 133/71  04/06/18 130/75     2. Mixed hyperlipidemia Does not really wtach diet and does very little exercise.  3. Type 2 diabetes mellitus with diabetic polyneuropathy, without long-term current use of insulin (HCC) Last hgab1c was 7.0. her blood sugars have been running around 140-160. Has had some readings over 200.  4. Gastroesophageal reflux disease without esophagitis Is currently not on dexilant. She has severe symptoms if she does not take medication.  5. Chronic kidney disease, stage III (moderate) (HCC) Last creatine was 1.04 .   6. Age-related osteoporosis without current pathological fracture Last dexascan was done 12/28/17 with t score of -2.7. she is currently on prolia injections without any complications. Does no dedicated weight bearing exercise.  7. GAD (generalized anxiety disorder) Has a rx for ativan to take as needed. She only  takes maybe 1-2 x a month.  8. BMI 28.0-28.9,adult No recent weight changes.  9. Diabetic neuropathy Is on neuropathy daily  Outpatient Encounter Medications as of 05/02/2019  Medication Sig  . ALPHAGAN P 0.1 % SOLN Place 1 drop into both eyes 2 (two) times daily.   Marland Kitchen aspirin 81 MG tablet Take 81 mg by mouth every evening.   . bifidobacterium infantis (ALIGN) capsule Take 1 capsule by mouth daily.  . calcium citrate-vitamin D (CITRACAL+D) 315-200 MG-UNIT tablet Take 1 tablet by mouth 2 (two) times daily.  . Cholecalciferol (VITAMIN D3) 2000 UNITS TABS Take 1 capsule by mouth daily.   Marland Kitchen dexlansoprazole (DEXILANT) 60 MG capsule Take 1 capsule (60 mg total) by mouth daily.  Marland Kitchen gabapentin (NEURONTIN) 100 MG capsule Take 1 capsule (100 mg total) by mouth daily.  Marland Kitchen glipiZIDE (GLUCOTROL XL) 10 MG 24 hr tablet Take 1 tablet (10 mg total) by mouth daily with breakfast.  . ibuprofen (ADVIL,MOTRIN) 800 MG tablet Take 1 tablet (800 mg total) by mouth every 6 (six) hours as needed for pain.  Marland Kitchen latanoprost (XALATAN) 0.005 % ophthalmic solution Place 1 drop into both eyes at bedtime.   Marland Kitchen LORazepam (ATIVAN) 0.5 MG tablet Take 1 tablet (0.5 mg total) by mouth 2 (two) times daily as needed for anxiety or sleep.  . Multiple Vitamins-Minerals (PRESERVISION/LUTEIN PO) Take by mouth.  . ONE TOUCH ULTRA TEST test strip Test BS once daily and PRN dx.E11.9  . PROLIA 60 MG/ML SOSY injection TO BE ADMINISTERED IN PHYSICIAN'S OFFICE. INJECT ONE SYRINGE SUBCOUSLY ONCE EVERY 6 MONTHS. REFRIGERATE. USE WITHIN  14 DAYS ONCE AT ROOM TEMPERATURE.  . sitaGLIPtin (JANUVIA) 100 MG tablet Take 1 tablet (100 mg total) by mouth daily.  . TRULICITY 8.10 FB/5.1WC SOPN Inject 0.75 mg into the skin once a week.  . vitamin B-12 (CYANOCOBALAMIN) 1000 MCG tablet Take 1,000 mcg by mouth daily.              Past Surgical History:  Procedure Laterality Date  .  vitrectomy  02/23/08   posterior; right eye  . ABDOMINAL HYSTERECTOMY     . ACHILLES TENDON SURGERY Left 03/28/2016   Procedure: Reconstruction Left Achilles;  Surgeon: Newt Minion, MD;  Location: [facility];  Service: Orthopedics;  Laterality: Left;  . 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 COLONOSCOPY  2013  . EYE SURGERY    . FOOT SURGERY     Right foot / left heel  . glaucome surgery right eye    . SKIN GRAFT Right 2007   foot  . TUBAL LIGATION      Family History  Problem Relation Age of Onset  . Dementia Father   . Diabetes Father   . Coronary artery disease Father 2  . Hip fracture Father   . Atrial fibrillation Mother   . Osteoporosis Mother     New complaints: None today  Social history: Has a new dog that they rescued dachshund  Controlled substance contract: will have signed at next visit    Review of Systems  Constitutional: Negative for diaphoresis and weight loss.  Eyes: Negative for blurred vision, double vision and pain.  Respiratory: Negative for shortness of breath.   Cardiovascular: Negative for chest pain, palpitations, orthopnea and leg swelling.  Gastrointestinal: Negative for abdominal pain.  Skin: Negative for rash.  Neurological: Negative for dizziness, sensory change, loss of consciousness, weakness and headaches.  Endo/Heme/Allergies: Negative for polydipsia. Does not bruise/bleed easily.  Psychiatric/Behavioral: Negative for memory loss. The patient does not have insomnia.   All other systems reviewed and are negative.    Observations/Objective: Alert and oriented- answers all questions appropriately No distress noted  Assessment and Plan: Catherine Cooper comes in today with chief complaint of Medical Management of Chronic Issues   Diagnosis and orders addressed:  1. Essential hypertension Low sodium diet  2. Mixed hyperlipidemia Low fat diet  3. Type 2 diabetes mellitus with  diabetic polyneuropathy, without long-term current use of insulin (HCC) Stricter carb counting - Dulaglutide (TRULICITY) 5.85 ID/7.8EU SOPN; Inject 0.75 mg into the skin once a week.  Dispense: 2 mL; Refill: 5 - gabapentin (NEURONTIN) 100 MG capsule; Take 1 capsule (100 mg total) by mouth daily.  Dispense: 90 capsule; Refill: 1 - glipiZIDE (GLUCOTROL XL) 10 MG 24 hr tablet; Take 1 tablet (10 mg total) by mouth daily with breakfast.  Dispense: 90 tablet; Refill: 1  4. Gastroesophageal reflux disease without esophagitis Avoid spicy foods Do not eat 2 hours prior to bedtime - dexlansoprazole (DEXILANT) 60 MG capsule; Take 1 capsule (60 mg total) by mouth daily.  Dispense: 90 capsule; Refill: 1  5. Chronic kidney disease, stage III (moderate) (HCC)  6. Age-related osteoporosis without current pathological fracture Weight bearing exercsie when can tolerate  7. GAD (generalized anxiety disorder) Stress management  8. BMI 28.0-28.9,adult Discussed diet and exercise for person with BMI >25 Will recheck weight in 3-6 months   Labs pending Health Maintenance  reviewed Diet and exercise encouraged  Follow up plan: 3 months     I discussed the assessment and treatment plan with the patient. The patient was provided an opportunity to ask questions and all were answered. The patient agreed with the plan and demonstrated an understanding of the instructions.   The patient was advised to call back or seek an in-person evaluation if the symptoms worsen or if the condition fails to improve as anticipated.  The above assessment and management plan was discussed with the patient. The patient verbalized understanding of and has agreed to the management plan. Patient is aware to call the clinic if symptoms persist or worsen. Patient is aware when to return to the clinic for a follow-up visit. Patient educated on when it is appropriate to go to the emergency department.   Time call ended:  8:45 I  provided 15 minutes of non-face-to-face time during this encounter.    Mary-Margaret Hassell Done, FNP

## 2019-05-03 ENCOUNTER — Telehealth: Payer: Self-pay | Admitting: Nurse Practitioner

## 2019-05-03 DIAGNOSIS — E1142 Type 2 diabetes mellitus with diabetic polyneuropathy: Secondary | ICD-10-CM

## 2019-05-03 DIAGNOSIS — K219 Gastro-esophageal reflux disease without esophagitis: Secondary | ICD-10-CM

## 2019-05-03 MED ORDER — TRULICITY 0.75 MG/0.5ML ~~LOC~~ SOAJ: SUBCUTANEOUS | 5 refills | Status: DC

## 2019-05-03 MED ORDER — DEXILANT 60 MG PO CPDR: 1.0000 | DELAYED_RELEASE_CAPSULE | Freq: Every day | ORAL | 1 refills | Status: DC

## 2019-05-03 MED ORDER — GABAPENTIN 100 MG PO CAPS: 100.0000 mg | ORAL_CAPSULE | Freq: Every day | ORAL | 1 refills | Status: DC

## 2019-05-03 NOTE — Telephone Encounter (Signed)
gabapentin (NEURONTIN) 100 MG capsule dexlansoprazole (DEXILANT) 60 MG capsule Dulaglutide (TRULICITY) 7.34 KA/7.6OT SOPN These medications needs to be sent to [facility]. Pt states that she [facility] the these [facility].

## 2019-05-03 NOTE — Telephone Encounter (Signed)
Patient aware rx sent to [facility].  °

## 2019-05-19 ENCOUNTER — Encounter: Payer: Self-pay | Admitting: *Deleted

## 2019-05-19 ENCOUNTER — Ambulatory Visit (INDEPENDENT_AMBULATORY_CARE_PROVIDER_SITE_OTHER): Payer: Medicare Other | Admitting: *Deleted

## 2019-05-19 DIAGNOSIS — Z0000 Encounter for general adult medical examination without abnormal findings: Secondary | ICD-10-CM | POA: Diagnosis not present

## 2019-05-19 NOTE — Progress Notes (Addendum)
MEDICARE ANNUAL WELLNESS VISIT  05/19/2019  Telephone Visit Disclaimer This Medicare AWV was conducted by telephone due to national recommendations for restrictions regarding the COVID-19 Pandemic (e.g. social distancing).  I verified, using two identifiers, that I am speaking with Catherine Cooper or their authorized healthcare agent. I discussed the limitations, risks, security, and privacy concerns of performing an evaluation and management service by telephone and the potential availability of an in-person appointment in the future. The patient expressed understanding and agreed to proceed.   Subjective:  Catherine Cooper is a 78 y.o. female patient of Catherine Cooper, [place] who had a Medicare Annual Wellness Visit today via telephone. Catherine Cooper is Retired and lives with their spouse. She has 3 children 2 daughters living and 1 son deceased. She reports that she is socially active and does interact with friends/family regularly. She is minimally physically active due to bilateral foot neuropathy. She enjoys reading, walking the dog, spending time with family and quilting.  Patient Care Team: Catherine Pretty, FNP as PCP - General (Nurse Practitioner) Catherine Cooper, DPM as Consulting Physician (Podiatry) Catherine Cooper, Catherine Demark, MD as Consulting Physician (Ophthalmology) Catherine Fam, MD as Consulting Physician (Ophthalmology) Catherine Irani, MD (Inactive) as Consulting Physician (Gastroenterology) Catherine Minion, MD as Consulting Physician (Orthopedic Surgery)  Advanced Directives 05/19/2019 03/28/2016 07/18/2015 05/09/2015 02/14/2015 10/25/2014  Does Patient Have a Medical Advance Directive? No No No No No No  Would patient like information on creating a medical advance directive? No - Patient declined No - patient declined information No - patient declined information - No - patient declined information Yes - Educational materials given    Hospital Utilization Over the Past 12 Months: # of  hospitalizations or ER visits: 0 # of surgeries: 0  Review of Systems    Patient reports that her overall health is unchanged compared to last year.  Patient Reported Readings (BP, Pulse, CBG, Weight, etc) none  Review of Systems: Negative except for elevated blood sugars  History obtained from chart review and the patient  All other systems negative.  Pain Assessment       Current Medications & Allergies (verified) Allergies as of 05/19/2019      Reactions   Jardiance [empagliflozin] Other (See Comments)   Legs eak   Lipitor [atorvastatin]    fatique and myalgia   Penicillins Rash      Medication List       Accurate as of May 19, 2019  8:47 AM. If you have any questions, ask your nurse or doctor.        Alphagan P 0.1 % Soln Generic drug: brimonidine Place 1 drop into both eyes 2 (two) times daily.   aspirin 81 MG tablet Take 81 mg by mouth every evening.   bifidobacterium infantis capsule Take 1 capsule by mouth daily.   calcium citrate-vitamin D 315-200 MG-UNIT tablet Commonly known as: CITRACAL+D Take 1 tablet by mouth 2 (two) times daily.   Dexilant 60 MG capsule Generic drug: dexlansoprazole Take 1 capsule (60 mg total) by mouth daily.   gabapentin 100 MG capsule Commonly known as: NEURONTIN Take 1 capsule (100 mg total) by mouth daily.   glipiZIDE 10 MG 24 hr tablet Commonly known as: GLUCOTROL XL Take 1 tablet (10 mg total) by mouth daily with breakfast.   ibuprofen 800 MG tablet Commonly known as: ADVIL Take 1 tablet (800 mg total) by mouth every 6 (six) hours as needed for pain.   latanoprost 0.005 % ophthalmic  solution Commonly known as: XALATAN Place 1 drop into both eyes at bedtime.   LORazepam 0.5 MG tablet Commonly known as: ATIVAN Take 1 tablet (0.5 mg total) by mouth 2 (two) times daily as needed for anxiety or sleep.   ONE TOUCH ULTRA TEST test strip Generic drug: glucose blood Test BS once daily and PRN dx.E11.9    PRESERVISION/LUTEIN PO Take by mouth.   Prolia 60 MG/ML Sosy injection Generic drug: denosumab TO BE ADMINISTERED IN PHYSICIAN'S OFFICE. INJECT ONE SYRINGE SUBCOUSLY ONCE EVERY 6 MONTHS. REFRIGERATE. USE WITHIN 14 DAYS ONCE AT ROOM TEMPERATURE.   sitaGLIPtin 100 MG tablet Commonly known as: Januvia Take 1 tablet (100 mg total) by mouth daily.   SSD 1 % cream Generic drug: silver sulfADIAZINE   Trulicity 5.32 DJ/2.4QA Sopn Generic drug: Dulaglutide Inject 0.75 mg into the skin once a week.   vitamin B-12 1000 MCG tablet Commonly known as: CYANOCOBALAMIN Take 1,000 mcg by mouth daily.   Vitamin D3 50 MCG (2000 UT) Tabs Take 1 capsule by mouth daily.       History (reviewed): Past Medical History:  Diagnosis Date  . Achilles rupture, left   . Anxiety   . Cataracts, bilateral   . 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 Diabetes mellitus    type II  . Diabetic neuropathy ([place])   . Diarrhea    attributed to diabetic meds  . Endometrioid adenocarcinoma   . GAD (generalized anxiety disorder)   . GERD (gastroesophageal reflux disease)   . Glaucoma   . Hyperlipidemia    not on medications  . Iron deficiency   . Nephrolithiasis 2000  . Neuropathy    both feet  . Osteoporosis   . Shingles 2004  . 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  vitrectomy  02/23/08   posterior; right eye  . ABDOMINAL HYSTERECTOMY    . ACHILLES TENDON SURGERY Left 03/28/2016   Procedure: Reconstruction Left Achilles;  Surgeon: Catherine Minion, MD;  Location: [facility];  Service: Orthopedics;  Laterality: Left;  . 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 COLONOSCOPY  2013  . EYE SURGERY    . FOOT SURGERY     Right foot / left heel  .  glaucome surgery right eye    . SKIN GRAFT Right 2007   foot  . TUBAL LIGATION     Family History  Problem Relation Age of Onset  . Dementia Father   . Diabetes Father   . Coronary artery disease Father 46  . Hip fracture Father   . Atrial fibrillation Mother   . Osteoporosis Mother    Social History   Socioeconomic History  . Marital status: Married    Spouse name: Juanda Crumble  . Number of children: 3  . Years of education: 37  . Highest education level: Some college, no degree  Occupational History  . Occupation: retired    Comment: [employer]  Social Needs  . Financial resource strain: Not very hard  . Food insecurity    Worry: Never true    Inability: Never true  . Transportation needs    Medical: No    Non-medical: No  Tobacco Use  . Smoking status:  Never Smoker  . Smokeless tobacco: Never Used  Substance and Sexual Activity  . Alcohol use: No  . Drug use: No  . Sexual activity: Not on file  Lifestyle  . Physical activity    Days per week: 0 days    Minutes per session: 0 min  . Stress: Only a little  Relationships  . Social connections    Talks on phone: More than three times a week    Gets together: Once a week    Attends religious service: More than 4 times per year    Active member of club or organization: No    Attends meetings of clubs or organizations: Never    Relationship status: Married  Other Topics Concern  . Not on file  Social History Narrative   2 healthy daughters and 1 son deceased (suicide @ 15)    Activities of Daily Living In your present state of health, do you have any difficulty performing the following activities: 05/19/2019  Hearing? N  Vision? N  Comment Wears glasses  Difficulty concentrating or making decisions? N  Walking or climbing stairs? N  Dressing or bathing? N  Doing errands, shopping? N  Preparing Food and eating ? N  Using the Toilet? N  In the past six months, have you accidently leaked  urine? N  Do you have problems with loss of bowel control? N  Managing your Medications? N  Managing your Finances? N  Housekeeping or managing your Housekeeping? N  Some recent data might be hidden    Patient Education/ Literacy    Exercise Current Exercise Habits: The patient does not participate in regular exercise at present  Diet Patient reports consuming 2 meals a day and 1 snack(s) a day Patient reports that her primary diet is: Diabetic Patient reports that she does have regular access to food.   Depression Screen PHQ 2/9 Scores 05/19/2019 10/26/2018 04/06/2018 02/16/2018 12/28/2017 09/17/2017 09/17/2017  PHQ - 2 Score 0 0 0 0 0 0 0     Fall Risk Fall Risk  05/19/2019 10/26/2018 04/06/2018 12/28/2017 09/17/2017  Falls in the past year? 0 0 Yes No No  Number falls in past yr: - - 1 - -  Injury with Fall? - - No - -     Objective:  Catherine Cooper seemed alert and oriented and she participated appropriately during our telephone visit.  Blood Pressure Weight BMI  BP Readings from Last 3 Encounters:  10/26/18 120/62  07/12/18 133/71  04/06/18 130/75   Wt Readings from Last 3 Encounters:  10/26/18 160 lb (72.6 kg)  07/12/18 163 lb (73.9 kg)  04/06/18 160 lb (72.6 kg)   BMI Readings from Last 1 Encounters:  10/26/18 26.63 kg/m    *Unable to obtain current vital signs, weight, and BMI due to telephone visit type  Hearing/Vision  . Jisel did not seem to have difficulty with hearing/understanding during the telephone conversation . Reports that she has had a formal eye exam by an eye care professional within the past year . Reports that she has not had a formal hearing evaluation within the past year *Unable to fully assess hearing and vision during telephone visit type  Cognitive Function: 6CIT Screen 05/19/2019  What Year? 0 points  What month? 0 points  What time? 0 points  Count back from 20 0 points  Months in reverse 0 points  Repeat phrase 2 points  Total Score 2    (Normal:0-7, Significant for Dysfunction: >8)  Normal Cognitive Function Screening: Yes   Immunization & Health Maintenance Record Immunization History  Administered Date(s) Administered  . Influenza Whole 08/12/2010  . Influenza, High Dose Seasonal PF 08/21/2016, 07/14/2017, 07/12/2018  . Influenza,inj,Quad PF,6+ Mos 07/04/2013, 07/20/2014, 07/18/2015  . Pneumococcal Conjugate-13 01/31/2015  . Pneumococcal Polysaccharide-23 08/12/2010  . Zoster 04/12/2014    Health Maintenance  Topic Date Due  . MAMMOGRAM  03/11/2019  . HEMOGLOBIN A1C  04/26/2019  . INFLUENZA VACCINE  05/14/2019  . URINE MICROALBUMIN  10/27/2019  . DEXA SCAN  12/29/2019  . OPHTHALMOLOGY EXAM  03/15/2020  . FOOT EXAM  05/01/2020  . TETANUS/TDAP  02/10/2021  . PNA vac Low Risk Adult  Completed       Assessment  This is a routine wellness examination for Catherine Cooper.  Health Maintenance: Due or Overdue Health Maintenance Due  Topic Date Due  . MAMMOGRAM  03/11/2019  . HEMOGLOBIN A1C  04/26/2019  . INFLUENZA VACCINE  05/14/2019    Catherine Cooper does not need a referral for Community Assistance: Care Management:   no Social Work:    no Prescription Assistance:  no Nutrition/Diabetes Education:  no   Plan:  Personalized Goals Goals Addressed            This Visit's Progress   . AWV GOALS       05/19/2019 AWV Goal: Exercise for General Health   Patient will verbalize understanding of the benefits of increased physical activity:  Exercising regularly is important. It will improve your overall fitness, flexibility, and endurance.  Regular exercise also will improve your overall health. It can help you control your weight, reduce stress, and improve your bone density.  Over the next year, patient will increase physical activity as tolerated with a goal of at least 150 minutes of moderate physical activity per week.   You can tell that you are exercising at a moderate intensity if your  heart starts beating faster and you start breathing faster but can still hold a conversation.  Moderate-intensity exercise ideas include:  Walking 1 mile (1.6 km) in about 15 minutes  Biking  Hiking  Golfing  Dancing  Water aerobics  Patient will verbalize understanding of everyday activities that increase physical activity by providing examples like the following: ? Yard work, such as: ? Pushing a Conservation officer, nature ? Raking and bagging leaves ? Washing your car ? Pushing a stroller ? Shoveling snow ? Gardening ? Washing windows or floors  Patient will be able to explain general safety guidelines for exercising:   Before you start a new exercise program, talk with your health care provider.  Do not exercise so much that you hurt yourself, feel dizzy, or get very short of breath.  Wear comfortable clothes and wear shoes with good support.  Drink plenty of water while you exercise to prevent dehydration or heat stroke.  Work out until your breathing and your heartbeat get faster.  05/19/2019 AWV Goal: Diabetes Management  . Patient will maintain an A1C level below 8.0 . Patient will not develop any diabetic foot complications . Patient will not experience any hypoglycemic episodes over the next 3 months . Patient will notify our office of any CBG readings outside of the provider recommended range by calling [phone] . Patient will adhere to provider recommendations for diabetes management  Patient Self Management Activities . take all medications as prescribed and report any negative side effects . monitor and record blood sugar readings as directed . adhere to  a low carbohydrate diet that incorporates lean proteins, vegetables, whole grains, low glycemic fruits . check feet daily noting any sores, cracks, injuries, or callous formations . see PCP or podiatrist if she notices any changes in her legs, feet, or toenails . Patient will visit PCP and have an A1C level checked  every 3 to 6 months as directed  . have a yearly eye exam to monitor for vascular changes associated with diabetes and will request that the report be sent to her pcp.  . consult with her PCP regarding any changes in her health or new or worsening symptoms       Personalized Health Maintenance & Screening Recommendations  Screening mammography  Lung Cancer Screening Recommended: no (Low Dose CT Chest recommended if Age 33-80 years, 30 pack-year currently smoking OR have quit w/in past 15 years) Hepatitis C Screening recommended: no HIV Screening recommended: no  Advanced Directives: Written information was not prepared per patient's request.  Referrals & Orders No orders of the defined types were placed in this encounter.   Follow-up Plan . Follow-up with Catherine Pretty, FNP as planned . Mammogram scheduled for 06/29/2019.     I have personally reviewed and noted the following in the patient's chart:   . Medical and social history . Use of alcohol, tobacco or illicit drugs  . Current medications and supplements . Functional ability and status . Nutritional status . Physical activity . Advanced directives . List of other physicians . Hospitalizations, surgeries, and ER visits in previous 12 months . Vitals . Screenings to include cognitive, depression, and falls . Referrals and appointments  In addition, I have reviewed and discussed with Catherine Cooper certain preventive protocols, quality metrics, and best practice recommendations. A written personalized care plan for preventive services as well as general preventive health recommendations is available and can be mailed to the patient at her request.      Gareth Morgan  05/19/2019  I have reviewed and agree with the above AWV documentation.   Mary-Margaret Hassell Done, FNP

## 2019-06-03 ENCOUNTER — Other Ambulatory Visit: Payer: Self-pay | Admitting: Nurse Practitioner

## 2019-06-03 DIAGNOSIS — F411 Generalized anxiety disorder: Secondary | ICD-10-CM

## 2019-06-16 ENCOUNTER — Other Ambulatory Visit: Payer: Self-pay | Admitting: Nurse Practitioner

## 2019-06-29 LAB — HM MAMMOGRAPHY

## 2019-07-13 ENCOUNTER — Other Ambulatory Visit: Payer: Self-pay | Admitting: Nurse Practitioner

## 2019-07-13 DIAGNOSIS — E1142 Type 2 diabetes mellitus with diabetic polyneuropathy: Secondary | ICD-10-CM

## 2019-08-08 ENCOUNTER — Ambulatory Visit: Payer: Self-pay | Admitting: Nurse Practitioner

## 2019-08-10 ENCOUNTER — Other Ambulatory Visit: Payer: Self-pay

## 2019-08-11 ENCOUNTER — Encounter: Payer: Self-pay | Admitting: Nurse Practitioner

## 2019-08-11 ENCOUNTER — Ambulatory Visit (INDEPENDENT_AMBULATORY_CARE_PROVIDER_SITE_OTHER): Payer: Medicare Other | Admitting: Nurse Practitioner

## 2019-08-11 VITALS — BP 118/69 | HR 90 | Temp 97.5°F | Resp 20 | Ht 65.0 in | Wt 164.0 lb

## 2019-08-11 DIAGNOSIS — K219 Gastro-esophageal reflux disease without esophagitis: Secondary | ICD-10-CM | POA: Diagnosis not present

## 2019-08-11 DIAGNOSIS — Z6828 Body mass index (BMI) 28.0-28.9, adult: Secondary | ICD-10-CM

## 2019-08-11 DIAGNOSIS — E782 Mixed hyperlipidemia: Secondary | ICD-10-CM

## 2019-08-11 DIAGNOSIS — N1831 Chronic kidney disease, stage 3a: Secondary | ICD-10-CM

## 2019-08-11 DIAGNOSIS — Z23 Encounter for immunization: Secondary | ICD-10-CM

## 2019-08-11 DIAGNOSIS — I10 Essential (primary) hypertension: Secondary | ICD-10-CM | POA: Diagnosis not present

## 2019-08-11 DIAGNOSIS — K591 Functional diarrhea: Secondary | ICD-10-CM

## 2019-08-11 DIAGNOSIS — M810 Age-related osteoporosis without current pathological fracture: Secondary | ICD-10-CM

## 2019-08-11 DIAGNOSIS — F411 Generalized anxiety disorder: Secondary | ICD-10-CM

## 2019-08-11 DIAGNOSIS — E1142 Type 2 diabetes mellitus with diabetic polyneuropathy: Secondary | ICD-10-CM

## 2019-08-11 DIAGNOSIS — C541 Malignant neoplasm of endometrium: Secondary | ICD-10-CM

## 2019-08-11 LAB — BAYER DCA HB A1C WAIVED: HB A1C (BAYER DCA - WAIVED): 7.9 % — ABNORMAL HIGH (ref <7.0)

## 2019-08-11 MED ORDER — DEXILANT 60 MG PO CPDR: 1.0000 | DELAYED_RELEASE_CAPSULE | Freq: Every day | ORAL | 1 refills | Status: DC

## 2019-08-11 MED ORDER — GABAPENTIN 100 MG PO CAPS: 100.0000 mg | ORAL_CAPSULE | Freq: Every day | ORAL | 1 refills | Status: DC

## 2019-08-11 MED ORDER — FARXIGA 10 MG PO TABS: 10.0000 mg | ORAL_TABLET | Freq: Every day | ORAL | 1 refills | Status: DC

## 2019-08-11 MED ORDER — DENOSUMAB 60 MG/ML ~~LOC~~ SOSY: 60.0000 mg | PREFILLED_SYRINGE | Freq: Once | SUBCUTANEOUS | 0 refills | Status: AC

## 2019-08-11 MED ORDER — GLIPIZIDE ER 10 MG PO TB24: 10.0000 mg | ORAL_TABLET | Freq: Every day | ORAL | 1 refills | Status: DC

## 2019-08-11 MED ORDER — TRULICITY 0.75 MG/0.5ML ~~LOC~~ SOAJ: SUBCUTANEOUS | 5 refills | Status: DC

## 2019-08-11 NOTE — Progress Notes (Signed)
Subjective:    Patient ID: Catherine Cooper, female    DOB: 1941/06/01, 78 y.o.   MRN: [id number]   Chief Complaint: Medical Management of Chronic Issues    HPI:  1. Type 2 diabetes mellitus with diabetic polyneuropathy, without long-term current use of insulin (HCC) Her blood sugars at home running high. This has been going on since she has been on [place]. She says if she eats at all then blood sugars are high.  Lab Results  Component Value Date   HGBA1C 7.0 (H) 10/26/2018      2. Essential hypertension No c/o chest pain, sob or headache. Does not check blood pressure at home. BP Readings from Last 3 Encounters:  08/11/19 118/69  10/26/18 120/62  07/12/18 133/71      3. Mixed hyperlipidemia Tries to watch diet but does not do much execise at all. Lab Results  Component Value Date   CHOL 166 10/26/2018   HDL 45 10/26/2018   LDLCALC 84 10/26/2018   TRIG 185 (H) 10/26/2018   CHOLHDL 3.7 10/26/2018     4. Gastroesophageal reflux disease without esophagitis Is on dexilant daily, which is the only thing that works for her. She has tried protonix and omeprazole with no relief at all.  5. Stage 3a chronic kidney disease No problems voiding Lab Results  Component Value Date   CREATININE 1.04 (H) 10/26/2018    6. GAD (generalized anxiety disorder) Takes ativan as needed. She stays stressed since covid has kept her at home.  GAD 7 : Generalized Anxiety Score 08/11/2019  Nervous, Anxious, on Edge 2  Control/stop worrying 1  Worry too much - different things 1  Trouble relaxing 1  Restless 1  Easily annoyed or irritable 0  Afraid - awful might happen 0  Total GAD 7 Score 6  Anxiety Difficulty Somewhat difficult    7. 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 We thought it was caused by metformin , but obviously not. She takes imodium AD soemtimes which helps  8. Endometrial cancer ([place]) *had over 5 years ago and is dong well. She had hysterectomy and  they said she needed no other treatment  9. Age-related osteoporosis without current pathological fracture Is on prolia injections and order was place to order more for her. Last dexascan was done 12/28/17 with t score of -2.7. she does not do any weight bearing exercises.  10. BMI 28.0-28.9,adult No recent weight changes Wt Readings from Last 3 Encounters:  08/11/19 164 lb (74.4 kg)  10/26/18 160 lb (72.6 kg)  07/12/18 163 lb (73.9 kg)   BMI Readings from Last 3 Encounters:  08/11/19 27.29 kg/m  10/26/18 26.63 kg/m  07/12/18 27.12 kg/m        Outpatient Encounter Medications as of 08/11/2019  Medication Sig  . ALPHAGAN P 0.1 % SOLN Place 1 drop into both eyes 2 (two) times daily.   Marland Kitchen aspirin 81 MG tablet Take 81 mg by mouth every evening.   . bifidobacterium infantis (ALIGN) capsule Take 1 capsule by mouth daily.  . calcium citrate-vitamin D (CITRACAL+D) 315-200 MG-UNIT tablet Take 1 tablet by mouth 2 (two) times daily.  . Cholecalciferol (VITAMIN D3) 2000 UNITS TABS Take 1 capsule by mouth daily.   Marland Kitchen dexlansoprazole (DEXILANT) 60 MG capsule Take 1 capsule (60 mg total) by mouth daily.  . Dulaglutide (TRULICITY) 7.82 NF/6.2ZH SOPN Inject 0.75 mg into the skin once a week.  . gabapentin (NEURONTIN) 100 MG capsule Take 1 capsule (100  mg total) by mouth daily.  Marland Kitchen glipiZIDE (GLUCOTROL XL) 10 MG 24 hr tablet Take 1 tablet (10 mg total) by mouth daily with breakfast.  . ibuprofen (ADVIL,MOTRIN) 800 MG tablet Take 1 tablet (800 mg total) by mouth every 6 (six) hours as needed for pain.  Marland Kitchen JANUVIA 100 MG tablet TAKE 1 TABLET BY MOUTH EVERY DAY  . latanoprost (XALATAN) 0.005 % ophthalmic solution Place 1 drop into both eyes at bedtime.   Marland Kitchen LORazepam (ATIVAN) 0.5 MG tablet TAKE 1 TABLET TWICE DAILY AS NEEDED FOR ANXIETY OR SLEEP  . Multiple Vitamins-Minerals (PRESERVISION/LUTEIN PO) Take by mouth.  Glory Rosebush ULTRA test strip TEST BLOOD SUGAR ONCE DAILYAND AS NEEDED  . PROLIA 60 MG/ML  SOSY injection TO BE ADMINISTERED IN PHYSICIAN'S OFFICE. INJECT ONE SYRINGE SUBCOUSLY ONCE EVERY 6 MONTHS. REFRIGERATE. USE WITHIN 14 DAYS ONCE AT ROOM TEMPERATURE.  . SSD 1 % cream   . vitamin B-12 (CYANOCOBALAMIN) 1000 MCG tablet Take 1,000 mcg by mouth daily.       Past Surgical History:  Procedure Laterality Date  .  vitrectomy  02/23/08   posterior; right eye  . ABDOMINAL HYSTERECTOMY    . ACHILLES TENDON SURGERY Left 03/28/2016   Procedure: Reconstruction Left Achilles;  Surgeon: Newt Minion, MD;  Location: [facility];  Service: Orthopedics;  Laterality: Left;  . 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 COLONOSCOPY  2013  . EYE SURGERY    . FOOT SURGERY     Right foot / left heel  . glaucome surgery right eye    . SKIN GRAFT Right 2007   foot  . TUBAL LIGATION      Family History  Problem Relation Age of Onset  . Dementia Father   . Diabetes Father   . Coronary artery disease Father 45  . Hip fracture Father   . Alzheimer's disease Father   . Atrial fibrillation Mother   . Osteoporosis Mother     New complaints: None today  Social history: Lives with her husband  Controlled substance contract: 08/11/19    Review of Systems  Constitutional: Negative for activity change and appetite change.  HENT: Negative.   Eyes: Negative for pain.  Respiratory: Negative for shortness of breath.   Cardiovascular: Negative for chest pain, palpitations and leg swelling.  Gastrointestinal: Negative for abdominal pain.  Endocrine: Negative for polydipsia.  Genitourinary: Negative.   Skin: Negative for rash.  Neurological: Negative for dizziness, weakness and headaches.  Hematological: Does not bruise/bleed easily.  Psychiatric/Behavioral: Negative.   All other systems reviewed and are negative.      Objective:   Physical Exam Vitals signs and nursing  note reviewed.  Constitutional:      General: She is not in acute distress.    Appearance: Normal appearance. She is well-developed.  HENT:     Head: Normocephalic.     Nose: Nose normal.  Eyes:     Pupils: Pupils are equal, round, and reactive to light.  Neck:     Musculoskeletal: Normal range of motion and neck supple.     Vascular: No carotid bruit or JVD.  Cardiovascular:     Rate and Rhythm: Normal rate and regular rhythm.     Heart sounds: Normal heart sounds.  Pulmonary:     Effort: Pulmonary effort is normal. No respiratory distress.     Breath  sounds: Normal breath sounds. No wheezing or rales.  Chest:     Chest wall: No tenderness.  Abdominal:     General: Bowel sounds are normal. There is no distension or abdominal bruit.     Palpations: Abdomen is soft. There is no hepatomegaly, splenomegaly, mass or pulsatile mass.     Tenderness: There is no abdominal tenderness.  Musculoskeletal: Normal range of motion.  Lymphadenopathy:     Cervical: No cervical adenopathy.  Skin:    General: Skin is warm and dry.  Neurological:     Mental Status: She is alert and oriented to person, place, and time.     Deep Tendon Reflexes: Reflexes are normal and symmetric.  Psychiatric:        Behavior: Behavior normal.        Thought Content: Thought content normal.        Judgment: Judgment normal.    BP 118/69   Pulse 90   Temp (!) 97.5 F (36.4 C) (Temporal)   Resp 20   Ht 5' 5"  (1.651 m)   Wt 164 lb (74.4 kg)   SpO2 99%   BMI 27.29 kg/m   hgba1c 7.9%      Assessment & Plan:  HANYA GUERIN comes in today with chief complaint of Medical Management of Chronic Issues   Diagnosis and orders addressed:  1. Type 2 diabetes mellitus with diabetic polyneuropathy, without long-term current use of insulin (HCC) Low carb diet Stopped januvia- changed to farxiga - Bayer DCA Hb A1c Waived - gabapentin (NEURONTIN) 100 MG capsule; Take 1 capsule (100 mg total) by mouth daily.   Dispense: 90 capsule; Refill: 1 - Dulaglutide (TRULICITY) 4.09 BD/5.3GD SOPN; Inject 0.75 mg into the skin once a week.  Dispense: 2 mL; Refill: 5 - glipiZIDE (GLUCOTROL XL) 10 MG 24 hr tablet; Take 1 tablet (10 mg total) by mouth daily with breakfast.  Dispense: 90 tablet; Refill: 1  2. Essential hypertension Low sodium diet - CMP14+EGFR  3. Mixed hyperlipidemia Low fat diet - Lipid panel  4. Gastroesophageal reflux disease without esophagitis Avoid spicy foods Do not eat 2 hours prior to bedtime - dexlansoprazole (DEXILANT) 60 MG capsule; Take 1 capsule (60 mg total) by mouth daily.  Dispense: 90 capsule; Refill: 1  5. Stage 3a chronic kidney disease Labs pending  6. GAD (generalized anxiety disorder) Stress management  7. Functional diarrhea Take imodium as needed  8. Endometrial cancer ([place])  9. Age-related osteoporosis without current pathological fracture Weight bearing exercises encouraged  10. BMI 28.0-28.9,adult Discussed diet and exercise for person with BMI >25 Will recheck weight in 3-6 months    Labs pending Health Maintenance reviewed Diet and exercise encouraged  Follow up plan: 3 months   Mary-Margaret Hassell Done, FNP

## 2019-08-11 NOTE — Patient Instructions (Signed)
Diabetes Mellitus and Foot Care Foot care is an important part of your health, especially when you have diabetes. Diabetes may cause you to have problems because of poor blood flow (circulation) to your feet and legs, which can cause your skin to:  Become thinner and drier.  Break more easily.  Heal more slowly.  Peel and crack. You may also have nerve damage (neuropathy) in your legs and feet, causing decreased feeling in them. This means that you may not notice minor injuries to your feet that could lead to more serious problems. Noticing and addressing any potential problems early is the best way to prevent future foot problems. How to care for your feet Foot hygiene  Wash your feet daily with warm water and mild soap. Do not use hot water. Then, pat your feet and the areas between your toes until they are completely dry. Do not soak your feet as this can dry your skin.  Trim your toenails straight across. Do not dig under them or around the cuticle. File the edges of your nails with an emery board or nail file.  Apply a moisturizing lotion or petroleum jelly to the skin on your feet and to dry, brittle toenails. Use lotion that does not contain alcohol and is unscented. Do not apply lotion between your toes. Shoes and socks  Wear clean socks or stockings every day. Make sure they are not too tight. Do not wear knee-high stockings since they may decrease blood flow to your legs.  Wear shoes that fit properly and have enough cushioning. Always look in your shoes before you put them on to be sure there are no objects inside.  To break in new shoes, wear them for just a few hours a day. This prevents injuries on your feet. Wounds, scrapes, corns, and calluses  Check your feet daily for blisters, cuts, bruises, sores, and redness. If you cannot see the bottom of your feet, use a mirror or ask someone for help.  Do not cut corns or calluses or try to remove them with medicine.  If you  find a minor scrape, cut, or break in the skin on your feet, keep it and the skin around it clean and dry. You may clean these areas with mild soap and water. Do not clean the area with peroxide, alcohol, or iodine.  If you have a wound, scrape, corn, or callus on your foot, look at it several times a day to make sure it is healing and not infected. Check for: ? Redness, swelling, or pain. ? Fluid or blood. ? Warmth. ? Pus or a bad smell. General instructions  Do not cross your legs. This may decrease blood flow to your feet.  Do not use heating pads or hot water bottles on your feet. They may burn your skin. If you have lost feeling in your feet or legs, you may not know this is happening until it is too late.  Protect your feet from hot and cold by wearing shoes, such as at the beach or on hot pavement.  Schedule a complete foot exam at least once a year (annually) or more often if you have foot problems. If you have foot problems, report any cuts, sores, or bruises to your health care provider immediately. Contact a health care provider if:  You have a medical condition that increases your risk of infection and you have any cuts, sores, or bruises on your feet.  You have an injury that is not   healing.  You have redness on your legs or feet.  You feel burning or tingling in your legs or feet.  You have pain or cramps in your legs and feet.  Your legs or feet are numb.  Your feet always feel cold.  You have pain around a toenail. Get help right away if:  You have a wound, scrape, corn, or callus on your foot and: ? You have pain, swelling, or redness that gets worse. ? You have fluid or blood coming from the wound, scrape, corn, or callus. ? Your wound, scrape, corn, or callus feels warm to the touch. ? You have pus or a bad smell coming from the wound, scrape, corn, or callus. ? You have a fever. ? You have a red line going up your leg. Summary  Check your feet every day  for cuts, sores, red spots, swelling, and blisters.  Moisturize feet and legs daily.  Wear shoes that fit properly and have enough cushioning.  If you have foot problems, report any cuts, sores, or bruises to your health care provider immediately.  Schedule a complete foot exam at least once a year (annually) or more often if you have foot problems. This information is not intended to replace advice given to you by your health care provider. Make sure you discuss any questions you have with your health care provider. Document Released: 09/26/2000 Document Revised: 11/11/2017 Document Reviewed: 10/31/2016 Elsevier Patient Education  2020 Elsevier Inc.  

## 2019-08-12 LAB — CMP14+EGFR
ALT: 12 IU/L (ref 0–32)
AST: 12 IU/L (ref 0–40)
Albumin/Globulin Ratio: 1.6 (ref 1.2–2.2)
Albumin: 4.1 g/dL (ref 3.7–4.7)
Alkaline Phosphatase: 76 IU/L (ref 39–117)
BUN/Creatinine Ratio: 9 — ABNORMAL LOW (ref 12–28)
BUN: 9 mg/dL (ref 8–27)
Bilirubin Total: 0.6 mg/dL (ref 0.0–1.2)
CO2: 29 mmol/L (ref 20–29)
Calcium: 9.1 mg/dL (ref 8.7–10.3)
Chloride: 103 mmol/L (ref 96–106)
Creatinine, Ser: 0.99 mg/dL (ref 0.57–1.00)
GFR calc Af Amer: 63 mL/min/{1.73_m2} (ref >59)
GFR calc non Af Amer: 54 mL/min/{1.73_m2} — ABNORMAL LOW (ref >59)
Globulin, Total: 2.5 g/dL (ref 1.5–4.5)
Glucose: 186 mg/dL — ABNORMAL HIGH (ref 65–99)
Potassium: 4.2 mmol/L (ref 3.5–5.2)
Sodium: 143 mmol/L (ref 134–144)
Total Protein: 6.6 g/dL (ref 6.0–8.5)

## 2019-08-12 LAB — LIPID PANEL
Chol/HDL Ratio: 4.4 ratio (ref 0.0–4.4)
Cholesterol, Total: 173 mg/dL (ref 100–199)
HDL: 39 mg/dL — ABNORMAL LOW (ref >39)
LDL Chol Calc (NIH): 93 mg/dL (ref 0–99)
Triglycerides: 243 mg/dL — ABNORMAL HIGH (ref 0–149)
VLDL Cholesterol Cal: 41 mg/dL — ABNORMAL HIGH (ref 5–40)

## 2019-08-30 ENCOUNTER — Other Ambulatory Visit: Payer: Self-pay

## 2019-08-31 ENCOUNTER — Ambulatory Visit (INDEPENDENT_AMBULATORY_CARE_PROVIDER_SITE_OTHER): Payer: Medicare Other

## 2019-08-31 DIAGNOSIS — M810 Age-related osteoporosis without current pathological fracture: Secondary | ICD-10-CM | POA: Diagnosis not present

## 2019-08-31 NOTE — Progress Notes (Signed)
Prolia injection given to right deltoid. Patient tolerated well. Patient supplied through [employer].

## 2019-09-11 ENCOUNTER — Other Ambulatory Visit: Payer: Self-pay | Admitting: Nurse Practitioner

## 2019-09-16 ENCOUNTER — Other Ambulatory Visit: Payer: Self-pay | Admitting: Nurse Practitioner

## 2019-09-16 DIAGNOSIS — F411 Generalized anxiety disorder: Secondary | ICD-10-CM

## 2019-09-19 LAB — HM DIABETES EYE EXAM

## 2019-10-15 ENCOUNTER — Other Ambulatory Visit: Payer: Self-pay | Admitting: Nurse Practitioner

## 2019-10-24 ENCOUNTER — Other Ambulatory Visit: Payer: Self-pay | Admitting: Nurse Practitioner

## 2019-10-24 MED ORDER — FARXIGA 10 MG PO TABS: 10.0000 mg | ORAL_TABLET | Freq: Every day | ORAL | 1 refills | Status: DC

## 2019-10-24 NOTE — Telephone Encounter (Signed)
What is the name of the medication? Dapagliflozin propanedip; 10 mg  Wants it called into local [facility] so she can use her coupon and don't have to pay anything  Have you contacted your pharmacy to request a refill? YES  Which pharmacy would you [facility] sent to[facility] [place]   Patient notified that their request is being sent to the clinical staff for review and that they should receive a call once it is complete. If they do not receive a call within 24 hours they can check with their pharmacy or our office.

## 2019-11-11 ENCOUNTER — Other Ambulatory Visit: Payer: Self-pay

## 2019-11-14 ENCOUNTER — Encounter: Payer: Self-pay | Admitting: Nurse Practitioner

## 2019-11-14 ENCOUNTER — Ambulatory Visit (INDEPENDENT_AMBULATORY_CARE_PROVIDER_SITE_OTHER): Payer: Medicare Other | Admitting: Nurse Practitioner

## 2019-11-14 ENCOUNTER — Other Ambulatory Visit: Payer: Self-pay

## 2019-11-14 VITALS — BP 136/69 | HR 87 | Temp 97.1°F | Resp 20 | Ht 65.0 in | Wt 165.0 lb

## 2019-11-14 DIAGNOSIS — M25562 Pain in left knee: Secondary | ICD-10-CM

## 2019-11-14 DIAGNOSIS — I10 Essential (primary) hypertension: Secondary | ICD-10-CM

## 2019-11-14 DIAGNOSIS — N1831 Chronic kidney disease, stage 3a: Secondary | ICD-10-CM

## 2019-11-14 DIAGNOSIS — M810 Age-related osteoporosis without current pathological fracture: Secondary | ICD-10-CM

## 2019-11-14 DIAGNOSIS — E1142 Type 2 diabetes mellitus with diabetic polyneuropathy: Secondary | ICD-10-CM | POA: Diagnosis not present

## 2019-11-14 DIAGNOSIS — K219 Gastro-esophageal reflux disease without esophagitis: Secondary | ICD-10-CM | POA: Diagnosis not present

## 2019-11-14 DIAGNOSIS — F411 Generalized anxiety disorder: Secondary | ICD-10-CM

## 2019-11-14 DIAGNOSIS — Z6828 Body mass index (BMI) 28.0-28.9, adult: Secondary | ICD-10-CM

## 2019-11-14 DIAGNOSIS — E782 Mixed hyperlipidemia: Secondary | ICD-10-CM | POA: Diagnosis not present

## 2019-11-14 LAB — BAYER DCA HB A1C WAIVED: HB A1C (BAYER DCA - WAIVED): 7.9 % — ABNORMAL HIGH (ref <7.0)

## 2019-11-14 MED ORDER — OMEPRAZOLE 40 MG PO CPDR: 40.0000 mg | DELAYED_RELEASE_CAPSULE | Freq: Every day | ORAL | 3 refills | Status: DC

## 2019-11-14 NOTE — Patient Instructions (Signed)

## 2019-11-14 NOTE — Progress Notes (Signed)
Subjective:    Patient ID: Catherine Cooper, female    DOB: [birth year], 79 y.o.   MRN: [id number]   Chief Complaint: Medical Management of Chronic Issues    HPI:  1. Type 2 diabetes mellitus with diabetic polyneuropathy, without long-term current use of insulin (HCC) Fasting blood sugars are running around 130's most of th etime. She has had very few readings in 200. deneis any low blood sugars. We put her on faexiga at last visit and she says it has really helped her and she hs no side effects. Lab Results  Component Value Date   HGBA1C 7.9 (H) 08/11/2019     2. Essential hypertension No c/o chest pain, sob or headaches. She does not check blood presure at home. BP Readings from Last 3 Encounters:  11/14/19 136/69  08/11/19 118/69  10/26/18 120/62     3. Mixed hyperlipidemia Does try to watch fried foods in diet. Sh eis not on a statin. Lab Results  Component Value Date   CHOL 173 08/11/2019   HDL 39 (L) 08/11/2019   LDLCALC 93 08/11/2019   TRIG 243 (H) 08/11/2019   CHOLHDL 4.4 08/11/2019     4. Gastroesophageal reflux disease without esophagitis Is on dexilant but insurance will not pay for it unless she is on omeprazole.  5. Age-related osteoporosis without current pathological fracture Does some weight bearing exercise. She deneis any back pain. Last dexascan was done 2019 with t score of -2.7. she is currently on prolia and has had 3 injections.  6. Stage 3a chronic kidney disease No problems with voiding. Lab Results  Component Value Date   CREATININE 0.99 08/11/2019     7. GAD (generalized anxiety disorder) Is on ativan as needed. She tries not to take unless she needs it. GAD 7 : Generalized Anxiety Score 11/14/2019 08/11/2019  Nervous, Anxious, on Edge 1 2  Control/stop worrying 0 1  Worry too much - different things 0 1  Trouble relaxing 0 1  Restless 0 1  Easily annoyed or irritable 0 0  Afraid - awful might happen 0 0  Total GAD 7 Score 1 6    Anxiety Difficulty Not difficult at all Somewhat difficult      8. BMI 28.0-28.9,adult No recent weight changes. Wt Readings from Last 3 Encounters:  11/14/19 165 lb (74.8 kg)  08/11/19 164 lb (74.4 kg)  10/26/18 160 lb (72.6 kg)   BMI Readings from Last 3 Encounters:  11/14/19 27.46 kg/m  08/11/19 27.29 kg/m  10/26/18 26.63 kg/m       Outpatient Encounter Medications as of 11/14/2019  Medication Sig  . ALPHAGAN P 0.1 % SOLN Place 1 drop into both eyes 2 (two) times daily.   Marland Kitchen aspirin 81 MG tablet Take 81 mg by mouth every evening.   . bifidobacterium infantis (ALIGN) capsule Take 1 capsule by mouth daily.  . calcium citrate-vitamin D (CITRACAL+D) 315-200 MG-UNIT tablet Take 1 tablet by mouth 2 (two) times daily.  . Cholecalciferol (VITAMIN D3) 2000 UNITS TABS Take 1 capsule by mouth daily.   . dapagliflozin propanediol (FARXIGA) 10 MG TABS tablet Take 10 mg by mouth daily before breakfast.  . dexlansoprazole (DEXILANT) 60 MG capsule Take 1 capsule (60 mg total) by mouth daily.  . Dulaglutide (TRULICITY) 5.73 UK/0.2RK SOPN Inject 0.75 mg into the skin once a week.  . gabapentin (NEURONTIN) 100 MG capsule Take 1 capsule (100 mg total) by mouth daily.  Marland Kitchen glipiZIDE (GLUCOTROL XL) 10 MG 24  hr tablet Take 1 tablet (10 mg total) by mouth daily with breakfast.  . ibuprofen (ADVIL,MOTRIN) 800 MG tablet Take 1 tablet (800 mg total) by mouth every 6 (six) hours as needed for pain.  Marland Kitchen latanoprost (XALATAN) 0.005 % ophthalmic solution Place 1 drop into both eyes at bedtime.   Marland Kitchen LORazepam (ATIVAN) 0.5 MG tablet TAKE 1 TABLET TWICE DAILY AS NEEDED FOR ANXIETY OR SLEEP  . Multiple Vitamins-Minerals (PRESERVISION/LUTEIN PO) Take by mouth.  Glory Rosebush ULTRA test strip TEST BLOOD SUGAR ONCE DAILYAND AS NEEDED  . PROLIA 60 MG/ML SOSY injection TO BE ADMINISTERED IN PHYSICIAN'S OFFICE. INJECT ONE SYRINGE SUBCOUSLY ONCE EVERY 6 MONTHS. REFRIGERATE. USE WITHIN 14 DAYS ONCE AT ROOM TEMPERATURE.  .  SSD 1 % cream   . vitamin B-12 (CYANOCOBALAMIN) 1000 MCG tablet Take 1,000 mcg by mouth daily.      Past Surgical History:  Procedure Laterality Date  .  vitrectomy  02/23/08   posterior; right eye  . ABDOMINAL HYSTERECTOMY    . ACHILLES TENDON SURGERY Left 03/28/2016   Procedure: Reconstruction Left Achilles;  Surgeon: Newt Minion, MD;  Location: [facility];  Service: Orthopedics;  Laterality: Left;  . 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 COLONOSCOPY  2013  . EYE SURGERY    . FOOT SURGERY     Right foot / left heel  . glaucome surgery right eye    . SKIN GRAFT Right 2007   foot  . TUBAL LIGATION      Family History  Problem Relation Age of Onset  . Dementia Father   . Diabetes Father   . Coronary artery disease Father 15  . Hip fracture Father   . Alzheimer's disease Father   . Atrial fibrillation Mother   . Osteoporosis Mother     New complaints: Slight knee pain on left  Social history: Lives with husband  Controlled substance contract: n/a    Review of Systems  Constitutional: Negative for diaphoresis.  Eyes: Negative for pain.  Respiratory: Negative for shortness of breath.   Cardiovascular: Negative for chest pain, palpitations and leg swelling.  Gastrointestinal: Negative for abdominal pain.  Endocrine: Negative for polydipsia.  Skin: Negative for rash.  Neurological: Negative for dizziness, weakness and headaches.  Hematological: Does not bruise/bleed easily.  All other systems reviewed and are negative.      Objective:   Physical Exam Vitals and nursing note reviewed.  Constitutional:      General: She is not in acute distress.    Appearance: Normal appearance. She is well-developed.  HENT:     Head: Normocephalic.     Nose: Nose normal.  Eyes:     Pupils: Pupils are equal, round, and reactive to light.  Neck:      Vascular: No carotid bruit or JVD.  Cardiovascular:     Rate and Rhythm: Normal rate and regular rhythm.     Heart sounds: Normal heart sounds.  Pulmonary:     Effort: Pulmonary effort is normal. No respiratory distress.     Breath sounds: Normal breath sounds. No wheezing or rales.  Chest:     Chest wall: No tenderness.  Abdominal:     General: Bowel sounds are normal. There is no distension or abdominal bruit.     Palpations: Abdomen is soft. There is no hepatomegaly, splenomegaly, mass or pulsatile mass.  Tenderness: There is no abdominal tenderness.  Musculoskeletal:        General: Normal range of motion.     Cervical back: Normal range of motion and neck supple.     Comments: FROM of left knee without opin crpitus on flex and ext No effusion of left knee Pian along medial collateral ligament on palpation  Lymphadenopathy:     Cervical: No cervical adenopathy.  Skin:    General: Skin is warm and dry.  Neurological:     Mental Status: She is alert and oriented to person, place, and time.     Deep Tendon Reflexes: Reflexes are normal and symmetric.  Psychiatric:        Behavior: Behavior normal.        Thought Content: Thought content normal.        Judgment: Judgment normal.    BP 136/69   Pulse 87   Temp (!) 97.1 F (36.2 C) (Temporal)   Resp 20   Ht _0  (1.651 m)   Wt 165 lb (74.8 kg)   SpO2 100%   BMI 27.46 kg/m   HGBA1c 7.9%      Assessment & Plan:  ROMANDA TURRUBIATES comes in today with chief complaint of Medical Management of Chronic Issues   Diagnosis and orders addressed:  1. Type 2 diabetes mellitus with diabetic polyneuropathy, without long-term current use of insulin (HCC) Continue to keep diary of blood sugars - Bayer DCA Hb A1c Waived - Microalbumin / creatinine urine ratio  2. Essential hypertension Low sodium diet - CMP14+EGFR - CBC with Differential/Platelet  3. Mixed hyperlipidemia Low fat diet - Lipid panel  4.  Gastroesophageal reflux disease without esophagitis Avoid spicy foods Do not eat 2 hours prior to bedtime Stopped dexilant due to insurance Will try omeprazole if doe snot work will go back to dexilant - omeprazole (PRILOSEC) 40 MG capsule; Take 1 capsule (40 mg total) by mouth daily.  Dispense: 30 capsule; Refill: 3  5. Age-related osteoporosis without current pathological fracture We will repat dexascan in May  6. Stage 3a chronic kidney disease labs pending  7. GAD (generalized anxiety disorder) sress management  8. BMI 28.0-28.9,adult Discussed diet and exercise for person with BMI >25 Will recheck weight in 3-6 months  9. Left knee pain Ref to ortho  Labs pending Health Maintenance reviewed Diet and exercise encouraged  Follow up plan: 3 months   Mary-Margaret Hassell Done, FNP

## 2019-11-15 LAB — CBC WITH DIFFERENTIAL/PLATELET
Basophils Absolute: 0.1 10*3/uL (ref 0.0–0.2)
Basos: 1 %
EOS (ABSOLUTE): 0.5 10*3/uL — ABNORMAL HIGH (ref 0.0–0.4)
Eos: 9 %
Hematocrit: 40.7 % (ref 34.0–46.6)
Hemoglobin: 13.7 g/dL (ref 11.1–15.9)
Immature Grans (Abs): 0 10*3/uL (ref 0.0–0.1)
Immature Granulocytes: 0 %
Lymphocytes Absolute: 2 10*3/uL (ref 0.7–3.1)
Lymphs: 34 %
MCH: 31.9 pg (ref 26.6–33.0)
MCHC: 33.7 g/dL (ref 31.5–35.7)
MCV: 95 fL (ref 79–97)
Monocytes Absolute: 0.5 10*3/uL (ref 0.1–0.9)
Monocytes: 8 %
Neutrophils Absolute: 2.9 10*3/uL (ref 1.4–7.0)
Neutrophils: 48 %
Platelets: 158 10*3/uL (ref 150–450)
RBC: 4.29 x10E6/uL (ref 3.77–5.28)
RDW: 11.8 % (ref 11.7–15.4)
WBC: 6 10*3/uL (ref 3.4–10.8)

## 2019-11-15 LAB — CMP14+EGFR
ALT: 14 IU/L (ref 0–32)
AST: 14 IU/L (ref 0–40)
Albumin/Globulin Ratio: 1.5 (ref 1.2–2.2)
Albumin: 3.8 g/dL (ref 3.7–4.7)
Alkaline Phosphatase: 70 IU/L (ref 39–117)
BUN/Creatinine Ratio: 15 (ref 12–28)
BUN: 14 mg/dL (ref 8–27)
Bilirubin Total: 0.4 mg/dL (ref 0.0–1.2)
CO2: 25 mmol/L (ref 20–29)
Calcium: 8.7 mg/dL (ref 8.7–10.3)
Chloride: 107 mmol/L — ABNORMAL HIGH (ref 96–106)
Creatinine, Ser: 0.96 mg/dL (ref 0.57–1.00)
GFR calc Af Amer: 65 mL/min/{1.73_m2} (ref >59)
GFR calc non Af Amer: 56 mL/min/{1.73_m2} — ABNORMAL LOW (ref >59)
Globulin, Total: 2.5 g/dL (ref 1.5–4.5)
Glucose: 188 mg/dL — ABNORMAL HIGH (ref 65–99)
Potassium: 4.4 mmol/L (ref 3.5–5.2)
Sodium: 144 mmol/L (ref 134–144)
Total Protein: 6.3 g/dL (ref 6.0–8.5)

## 2019-11-15 LAB — LIPID PANEL
Chol/HDL Ratio: 3.5 ratio (ref 0.0–4.4)
Cholesterol, Total: 156 mg/dL (ref 100–199)
HDL: 44 mg/dL (ref >39)
LDL Chol Calc (NIH): 91 mg/dL (ref 0–99)
Triglycerides: 119 mg/dL (ref 0–149)
VLDL Cholesterol Cal: 21 mg/dL (ref 5–40)

## 2019-11-21 DIAGNOSIS — E113551 Type 2 diabetes mellitus with stable proliferative diabetic retinopathy, right eye: Secondary | ICD-10-CM | POA: Diagnosis not present

## 2019-11-21 DIAGNOSIS — H31009 Unspecified chorioretinal scars, unspecified eye: Secondary | ICD-10-CM | POA: Diagnosis not present

## 2019-11-21 DIAGNOSIS — E113492 Type 2 diabetes mellitus with severe nonproliferative diabetic retinopathy without macular edema, left eye: Secondary | ICD-10-CM | POA: Diagnosis not present

## 2019-11-21 DIAGNOSIS — H353122 Nonexudative age-related macular degeneration, left eye, intermediate dry stage: Secondary | ICD-10-CM | POA: Diagnosis not present

## 2019-11-21 LAB — HM DIABETES EYE EXAM

## 2019-11-24 ENCOUNTER — Encounter: Payer: Self-pay | Admitting: Orthopedic Surgery

## 2019-11-24 ENCOUNTER — Ambulatory Visit: Payer: Self-pay

## 2019-11-24 ENCOUNTER — Ambulatory Visit (INDEPENDENT_AMBULATORY_CARE_PROVIDER_SITE_OTHER): Payer: Medicare Other | Admitting: Orthopedic Surgery

## 2019-11-24 ENCOUNTER — Other Ambulatory Visit: Payer: Self-pay

## 2019-11-24 VITALS — Ht 65.0 in | Wt 165.0 lb

## 2019-11-24 DIAGNOSIS — G8929 Other chronic pain: Secondary | ICD-10-CM | POA: Diagnosis not present

## 2019-11-24 DIAGNOSIS — M1712 Unilateral primary osteoarthritis, left knee: Secondary | ICD-10-CM

## 2019-11-24 DIAGNOSIS — L97511 Non-pressure chronic ulcer of other part of right foot limited to breakdown of skin: Secondary | ICD-10-CM | POA: Diagnosis not present

## 2019-11-24 DIAGNOSIS — M25562 Pain in left knee: Secondary | ICD-10-CM

## 2019-11-24 MED ORDER — LIDOCAINE HCL (PF) 1 % IJ SOLN
5.0000 mL | INTRAMUSCULAR | Status: AC | PRN
  Administered 2019-11-24: 5 mL

## 2019-11-24 MED ORDER — METHYLPREDNISOLONE ACETATE 40 MG/ML IJ SUSP
40.0000 mg | INTRAMUSCULAR | Status: AC | PRN
  Administered 2019-11-24: 40 mg via INTRA_ARTICULAR

## 2019-11-24 NOTE — Progress Notes (Signed)
Office Visit Note   Patient: Catherine Cooper           Date of Birth: [birth year]           MRN: [id number] Visit Date: 11/24/2019              Requested by: Chevis Pretty, [address],  [place] [address] PCP: Chevis Pretty, FNP  Chief Complaint  Patient presents with  . Left Knee - Pain      HPI: Patient is a 79 year old woman who presents complaining of left knee pain medial joint line patient feels like she has had episodes where her knee feels like it is going to give out.  She is worried that she may fall.  Assessment & Plan: Visit Diagnoses:  1. Chronic pain of left knee   2. Unilateral primary osteoarthritis, left knee     Plan: Left knee was injected without complications reevaluate in 4 weeks.  No mechanical symptoms at this time.  Follow-Up Instructions: Return in about 4 weeks (around 12/22/2019).   Ortho Exam  Patient is alert, oriented, no adenopathy, well-dressed, normal affect, normal respiratory effort. Patient has an antalgic gait.  There is no effusion of the left knee no redness no cellulitis her patella tracks midline there is minimal crepitation with range of motion in the patellofemoral joint.  She is tender to palpation along the medial joint line flexion rotation is painful but no mechanical symptoms collaterals and cruciates are stable.  Hemoglobin A1c 7.9.  Imaging: No results found. No images are attached to the encounter.  Labs: Lab Results  Component Value Date   HGBA1C 7.9 (H) 11/14/2019   HGBA1C 7.9 (H) 08/11/2019   HGBA1C 7.0 (H) 10/26/2018   ESRSEDRATE 3 05/20/2013   LABURIC 4.6 05/20/2013     Lab Results  Component Value Date   ALBUMIN 3.8 11/14/2019   ALBUMIN 4.1 08/11/2019   ALBUMIN 4.0 10/26/2018   LABURIC 4.6 05/20/2013    No results found for: MG No results found for: VD25OH  No results found for: PREALBUMIN CBC EXTENDED Latest Ref Rng & Units 11/14/2019 07/12/2018 04/06/2018  WBC 3.4 -  10.8 x10E3/uL 6.0 5.8 5.2  RBC 3.77 - 5.28 x10E6/uL 4.29 4.00 3.83  HGB 11.1 - 15.9 g/dL 13.7 12.3 11.9  HCT 34.0 - 46.6 % 40.7 37.5 37.4  PLT 150 - 450 x10E3/uL 158 165 131(L)  NEUTROABS 1.4 - 7.0 x10E3/uL 2.9 3.1 2.2  LYMPHSABS 0.7 - 3.1 x10E3/uL 2.0 1.8 2.3     Body mass index is 27.46 kg/m.  Orders:  Orders Placed This Encounter  Procedures  . XR Knee 1-2 Views Left   No orders of the defined types were placed in this encounter.    Procedures: Large Joint Inj: L knee on 11/24/2019 8:56 AM Indications: pain and diagnostic evaluation Details: 22 G 1.5 in needle, anteromedial approach  Arthrogram: No  Medications: 5 mL lidocaine (PF) 1 %; 40 mg methylPREDNISolone acetate 40 MG/ML Outcome: tolerated well, no immediate complications Procedure, treatment alternatives, risks and benefits explained, specific risks discussed. Consent was given by the patient. Immediately prior to procedure a time out was called to verify the correct patient, procedure, equipment, support staff and site/side marked as required. Patient was prepped and draped in the usual sterile fashion.      Clinical Data: No additional findings.  ROS:  All other systems negative, except as noted in the HPI. Review of Systems  Objective: Vital Signs: Ht  5\' 5"  (1.651 m)   Wt 165 lb (74.8 kg)   BMI 27.46 kg/m   Specialty Comments:  No specialty comments available.  PMFS History: Patient Active Problem List   Diagnosis Date Noted  . Gastroesophageal reflux disease without esophagitis 12/12/2015  . Chronic kidney disease, stage III (moderate) 12/12/2015  . Diarrhea 06/04/2015  . BMI 28.0-28.9,adult 05/09/2015  . Hyperlipidemia 03/21/2013  . Endometrial cancer ([facility]) 12/04/2011  . HTN (hypertension) 09/16/2011  . Diabetes ([facility]) 01/11/2011  . Osteoporosis 01/11/2011  . Glaucoma 01/11/2011  . GAD (generalized anxiety disorder) 01/11/2011   Past Medical History:  Diagnosis Date  . Achilles  rupture, left   . Anxiety   . Cataracts, bilateral   . 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 Diabetes mellitus    type II  . Diabetic neuropathy ([facility])   . Diarrhea    attributed to diabetic meds  . Endometrioid adenocarcinoma   . GAD (generalized anxiety disorder)   . GERD (gastroesophageal reflux disease)   . Glaucoma   . Hyperlipidemia    not on medications  . Iron deficiency   . Nephrolithiasis 2000  . Neuropathy    both feet  . Osteoporosis   . Shingles 2004  . 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 Dementia Father   . Diabetes Father   . Coronary artery disease Father 35  . Hip fracture Father   . Alzheimer's disease Father   . Atrial fibrillation Mother   . Osteoporosis Mother     Past Surgical History:  Procedure Laterality Date  .  vitrectomy  02/23/08   posterior; right eye  . ABDOMINAL HYSTERECTOMY    . ACHILLES TENDON SURGERY Left 03/28/2016   Procedure: Reconstruction Left Achilles;  Surgeon: Newt Minion, MD;  Location: [facility];  Service: Orthopedics;  Laterality: Left;  . 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 COLONOSCOPY  2013  . EYE SURGERY    . FOOT SURGERY     Right foot / left heel  . glaucome surgery right eye    . SKIN GRAFT Right 2007   foot  . TUBAL LIGATION     Social History   Occupational History  . Occupation: retired    Comment: [employer]  Tobacco Use  . Smoking status: Never Smoker  . Smokeless tobacco: Never Used  Substance and Sexual Activity  . Alcohol use: No  . Drug use: No  . Sexual activity: Not on file

## 2019-12-14 ENCOUNTER — Telehealth: Payer: Self-pay | Admitting: Nurse Practitioner

## 2019-12-14 NOTE — Chronic Care Management (AMB) (Signed)
  Chronic Care Management   Note  12/14/2019 Name: PAULLA MCCLASKEY MRN: [id number] DOB: 1941-05-08  ROSAURA BOLON is a 79 y.o. year old female who is a primary care patient of Chevis Pretty, [place]. I reached out to [name] by phone today in response to a referral sent by Ms. Sonda Rumble health plan.     Ms. Tipping was given information about Chronic Care Management services today including:  1. CCM service includes personalized support from designated clinical staff supervised by her physician, including individualized plan of care and coordination with other care providers 2. 24/7 contact phone numbers for assistance for urgent and routine care needs. 3. Service will only be billed when office clinical staff spend 20 minutes or more in a month to coordinate care. 4. Only one practitioner may furnish and bill the service in a calendar month. 5. The patient may stop CCM services at any time (effective at the end of the month) by phone call to the office staff. 6. The patient will be responsible for cost sharing (co-pay) of up to 20% of the service fee (after annual deductible is met).  Patient agreed to services and verbal consent obtained.   Follow up plan: Telephone appointment with care management team member scheduled for: 02/07/2020  Noreene Larsson, [address], [place], [place] [address] Direct Dial: [phone] [email or web address]: [email or web address]

## 2019-12-22 ENCOUNTER — Other Ambulatory Visit: Payer: Self-pay

## 2019-12-22 ENCOUNTER — Ambulatory Visit (INDEPENDENT_AMBULATORY_CARE_PROVIDER_SITE_OTHER): Payer: Medicare Other | Admitting: Orthopedic Surgery

## 2019-12-22 ENCOUNTER — Encounter: Payer: Self-pay | Admitting: Orthopedic Surgery

## 2019-12-22 VITALS — Ht 65.0 in | Wt 165.0 lb

## 2019-12-22 DIAGNOSIS — M25562 Pain in left knee: Secondary | ICD-10-CM

## 2019-12-22 NOTE — Progress Notes (Signed)
Office Visit Note   Patient: Catherine Cooper           Date of Birth: [birth year]           MRN: [id number] Visit Date: 12/22/2019              Requested by: Chevis Pretty, [address] [place],  [place] [address] PCP: Chevis Pretty, FNP  Chief Complaint  Patient presents with  . Left Knee - Follow-up    S/p cortisone injection 11/24/19      HPI: This is a pleasant healthy 79 year old woman with a chief complaint of left knee pain and left knee giving out.  This is especially noticeable when she goes up and down stairs.  She was seen and evaluated approximately 4 weeks ago by Dr. Sharol Given.  He did give her an intra-articular knee injection.  She tells me that this provided no relief and she continues to have popping in the knee.  She is concerned about falling  Assessment & Plan: Visit Diagnoses:  1. Acute pain of left knee     Plan: I discussed with her that we will get an MRI.  If the MRI showed extensive arthritic changes in her knee we would most likely recommend knee replacement.  If her joint surfaces were fairly well-preserved and she had a meniscus tear or chondral defect we could go forward with a knee arthroscopy  Follow-Up Instructions: No follow-ups on file.   Ortho Exam  Patient is alert, oriented, no adenopathy, well-dressed, normal affect, normal respiratory effort. Left knee she has full range of motion no tenderness today over the joint line no cellulitis no audible popping or grinding  Imaging: No results found. No images are attached to the encounter.  Labs: Lab Results  Component Value Date   HGBA1C 7.9 (H) 11/14/2019   HGBA1C 7.9 (H) 08/11/2019   HGBA1C 7.0 (H) 10/26/2018   ESRSEDRATE 3 05/20/2013   LABURIC 4.6 05/20/2013     Lab Results  Component Value Date   ALBUMIN 3.8 11/14/2019   ALBUMIN 4.1 08/11/2019   ALBUMIN 4.0 10/26/2018   LABURIC 4.6 05/20/2013    No results found for: MG No results found for: VD25OH   No results found for: PREALBUMIN CBC EXTENDED Latest Ref Rng & Units 11/14/2019 07/12/2018 04/06/2018  WBC 3.4 - 10.8 x10E3/uL 6.0 5.8 5.2  RBC 3.77 - 5.28 x10E6/uL 4.29 4.00 3.83  HGB 11.1 - 15.9 g/dL 13.7 12.3 11.9  HCT 34.0 - 46.6 % 40.7 37.5 37.4  PLT 150 - 450 x10E3/uL 158 165 131(L)  NEUTROABS 1.4 - 7.0 x10E3/uL 2.9 3.1 2.2  LYMPHSABS 0.7 - 3.1 x10E3/uL 2.0 1.8 2.3     Body mass index is 27.46 kg/m.  Orders:  Orders Placed This Encounter  Procedures  . MR Knee Left w/o contrast   No orders of the defined types were placed in this encounter.    Procedures: No procedures performed  Clinical Data: No additional findings.  ROS:  All other systems negative, except as noted in the HPI. Review of Systems  Objective: Vital Signs: Ht 5\' 5"  (1.651 m)   Wt 165 lb (74.8 kg)   BMI 27.46 kg/m   Specialty Comments:  No specialty comments available.  PMFS History: Patient Active Problem List   Diagnosis Date Noted  . Gastroesophageal reflux disease without esophagitis 12/12/2015  . Chronic kidney disease, stage III (moderate) 12/12/2015  . Diarrhea 06/04/2015  . BMI 28.0-28.9,adult 05/09/2015  . Hyperlipidemia  03/21/2013  . Endometrial cancer ([place]) 12/04/2011  . HTN (hypertension) 09/16/2011  . Diabetes ([place]) 01/11/2011  . Osteoporosis 01/11/2011  . Glaucoma 01/11/2011  . GAD (generalized anxiety disorder) 01/11/2011   Past Medical History:  Diagnosis Date  . Achilles rupture, left   . Anxiety   . Cataracts, bilateral   . 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 Diabetes mellitus    type II  . Diabetic neuropathy ([place])   . Diarrhea    attributed to diabetic meds  . Endometrioid adenocarcinoma   . GAD (generalized anxiety disorder)   . GERD (gastroesophageal reflux disease)   . Glaucoma   . Hyperlipidemia    not on medications  . Iron deficiency   . Nephrolithiasis 2000  . Neuropathy    both feet  .  Osteoporosis   . Shingles 2004  . 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 Dementia Father   . Diabetes Father   . Coronary artery disease Father 42  . Hip fracture Father   . Alzheimer's disease Father   . Atrial fibrillation Mother   . Osteoporosis Mother     Past Surgical History:  Procedure Laterality Date  .  vitrectomy  02/23/08   posterior; right eye  . ABDOMINAL HYSTERECTOMY    . ACHILLES TENDON SURGERY Left 03/28/2016   Procedure: Reconstruction Left Achilles;  Surgeon: Newt Minion, MD;  Location: [facility];  Service: Orthopedics;  Laterality: Left;  . 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;  Service: Orthopedics;  Laterality: Right;  . COLONOSCOPY  2013  . EYE SURGERY    . FOOT SURGERY     Right foot / left heel  . glaucome surgery right eye    . SKIN GRAFT Right 2007   foot  . TUBAL LIGATION     Social History   Occupational History  . Occupation: retired    Comment: [employer]  Tobacco Use  . Smoking status: Never Smoker  . Smokeless tobacco: Never Used  Substance and Sexual Activity  . Alcohol use: No  . Drug use: No  . Sexual activity: Not on file

## 2020-01-19 DIAGNOSIS — B351 Tinea unguium: Secondary | ICD-10-CM | POA: Diagnosis not present

## 2020-01-19 DIAGNOSIS — M79676 Pain in unspecified toe(s): Secondary | ICD-10-CM | POA: Diagnosis not present

## 2020-01-30 ENCOUNTER — Ambulatory Visit
Admission: RE | Admit: 2020-01-30 | Discharge: 2020-01-30 | Disposition: A | Discharge status: Home / Self Care | Payer: Medicare Other | Source: Ambulatory Visit | Attending: Physician Assistant | Admitting: Physician Assistant

## 2020-01-30 DIAGNOSIS — M25562 Pain in left knee: Secondary | ICD-10-CM

## 2020-02-01 ENCOUNTER — Other Ambulatory Visit: Payer: Self-pay | Admitting: Nurse Practitioner

## 2020-02-01 DIAGNOSIS — F411 Generalized anxiety disorder: Secondary | ICD-10-CM

## 2020-02-02 ENCOUNTER — Telehealth: Payer: Self-pay | Admitting: Nurse Practitioner

## 2020-02-02 MED ORDER — FARXIGA 10 MG PO TABS: 10.0000 mg | ORAL_TABLET | Freq: Every day | ORAL | 1 refills | Status: DC

## 2020-02-02 NOTE — Telephone Encounter (Signed)
  Prescription Request  02/02/2020  What is the name of the medication or equipment? dapagliflozin propanediol 10 mg  Have you contacted your pharmacy to request a refill? (if applicable) NO  Which pharmacy would you like this sent to? [facility] - DO NOT SEND TO [facility]   Patient notified that their request is being sent to the clinical staff for review and that they should receive a response within 2 business days.

## 2020-02-02 NOTE — Telephone Encounter (Signed)
farxiga rx sent to mail order

## 2020-02-03 ENCOUNTER — Other Ambulatory Visit: Payer: Self-pay

## 2020-02-03 ENCOUNTER — Telehealth: Payer: Self-pay | Admitting: Nurse Practitioner

## 2020-02-03 MED ORDER — FARXIGA 10 MG PO TABS: 10.0000 mg | ORAL_TABLET | Freq: Every day | ORAL | 1 refills | Status: DC

## 2020-02-06 ENCOUNTER — Other Ambulatory Visit: Payer: Self-pay

## 2020-02-06 ENCOUNTER — Encounter: Payer: Self-pay | Admitting: Orthopedic Surgery

## 2020-02-06 ENCOUNTER — Ambulatory Visit (INDEPENDENT_AMBULATORY_CARE_PROVIDER_SITE_OTHER): Payer: Medicare Other | Admitting: Orthopedic Surgery

## 2020-02-06 VITALS — Ht 65.0 in | Wt 165.0 lb

## 2020-02-06 DIAGNOSIS — M1712 Unilateral primary osteoarthritis, left knee: Secondary | ICD-10-CM | POA: Diagnosis not present

## 2020-02-06 NOTE — Progress Notes (Signed)
Office Visit Note   Patient: Catherine Cooper           Date of Birth: 28-Dec-1940           MRN: [id number] Visit Date: 02/06/2020              Requested by: Chevis Pretty, [address] [place],  [place] [address] PCP: Chevis Pretty, FNP  Chief Complaint  Patient presents with  . Left Knee - Pain      HPI: Patient is a 79 year old woman who presents in follow-up for an MRI scan of her left knee.  She states that she has pain over the medial and lateral joint line denies an effusion states that she is concerned that she may fall but has minimal symptoms at this time.  Assessment & Plan: Visit Diagnoses:  1. Unilateral primary osteoarthritis, left knee     Plan: Recommend using a cane and her opposite hand if she ever has any giving way symptoms discussed that we could proceed with steroid injections for temporary relief discussed that permanent relief would require a total knee arthroplasty.  Patient states she is not interested and total knee surgery.  Follow-Up Instructions: Return if symptoms worsen or fail to improve.   Ortho Exam  Patient is alert, oriented, no adenopathy, well-dressed, normal affect, normal respiratory effort. Examination patient has an antalgic gait she is tender to palpation of medial lateral joint line collaterals and cruciates are stable there is no effusion there is minimal crepitation with range of motion.  Review of the MRI scan shows tricompartmental arthritic changes as well as a medial meniscal tear and osteonecrosis of the medial femoral condyle.  Imaging: No results found. No images are attached to the encounter.  Labs: Lab Results  Component Value Date   HGBA1C 7.9 (H) 11/14/2019   HGBA1C 7.9 (H) 08/11/2019   HGBA1C 7.0 (H) 10/26/2018   ESRSEDRATE 3 05/20/2013   LABURIC 4.6 05/20/2013     Lab Results  Component Value Date   ALBUMIN 3.8 11/14/2019   ALBUMIN 4.1 08/11/2019   ALBUMIN 4.0 10/26/2018   LABURIC 4.6 05/20/2013    No results found for: MG No results found for: VD25OH  No results found for: PREALBUMIN CBC EXTENDED Latest Ref Rng & Units 11/14/2019 07/12/2018 04/06/2018  WBC 3.4 - 10.8 x10E3/uL 6.0 5.8 5.2  RBC 3.77 - 5.28 x10E6/uL 4.29 4.00 3.83  HGB 11.1 - 15.9 g/dL 13.7 12.3 11.9  HCT 34.0 - 46.6 % 40.7 37.5 37.4  PLT 150 - 450 x10E3/uL 158 165 131(L)  NEUTROABS 1.4 - 7.0 x10E3/uL 2.9 3.1 2.2  LYMPHSABS 0.7 - 3.1 x10E3/uL 2.0 1.8 2.3     Body mass index is 27.46 kg/m.  Orders:  No orders of the defined types were placed in this encounter.  No orders of the defined types were placed in this encounter.    Procedures: No procedures performed  Clinical Data: No additional findings.  ROS:  All other systems negative, except as noted in the HPI. Review of Systems  Objective: Vital Signs: Ht 5\' 5"  (1.651 m)   Wt 165 lb (74.8 kg)   BMI 27.46 kg/m   Specialty Comments:  No specialty comments available.  PMFS History: Patient Active Problem List   Diagnosis Date Noted  . Gastroesophageal reflux disease without esophagitis 12/12/2015  . Chronic kidney disease, stage III (moderate) 12/12/2015  . Diarrhea 06/04/2015  . BMI 28.0-28.9,adult 05/09/2015  . Hyperlipidemia 03/21/2013  . Endometrial cancer (  [place]) 12/04/2011  . HTN (hypertension) 09/16/2011  . Diabetes ([place] [facility]) 01/11/2011  . Osteoporosis 01/11/2011  . Glaucoma 01/11/2011  . GAD (generalized anxiety disorder) 01/11/2011   Past Medical History:  Diagnosis Date  . Achilles rupture, left   . Anxiety   . Cataracts, bilateral   . 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 Diabetes mellitus    type II  . Diabetic neuropathy ([place])   . Diarrhea    attributed to diabetic meds  . Endometrioid adenocarcinoma   . GAD (generalized anxiety disorder)   . GERD (gastroesophageal reflux disease)   . Glaucoma   . Hyperlipidemia    not on medications  . Iron  deficiency   . Nephrolithiasis 2000  . Neuropathy    both feet  . Osteoporosis   . Shingles 2004  . 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 Dementia Father   . Diabetes Father   . Coronary artery disease Father 32  . Hip fracture Father   . Alzheimer's disease Father   . Atrial fibrillation Mother   . Osteoporosis Mother     Past Surgical History:  Procedure Laterality Date  .  vitrectomy  02/23/08   posterior; right eye  . ABDOMINAL HYSTERECTOMY    . ACHILLES TENDON SURGERY Left 03/28/2016   Procedure: Reconstruction Left Achilles;  Surgeon: Newt Minion, MD;  Location: [facility];  Service: Orthopedics;  Laterality: Left;  . 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 COLONOSCOPY  2013  . EYE SURGERY    . FOOT SURGERY     Right foot / left heel  . glaucome surgery right eye    . SKIN GRAFT Right 2007   foot  . TUBAL LIGATION     Social History   Occupational History  . Occupation: retired    Comment: [employer]  Tobacco Use  . Smoking status: Never Smoker  . Smokeless tobacco: Never Used  Substance and Sexual Activity  . Alcohol use: No  . Drug use: No  . Sexual activity: Not on file

## 2020-02-07 ENCOUNTER — Ambulatory Visit (INDEPENDENT_AMBULATORY_CARE_PROVIDER_SITE_OTHER): Payer: Medicare Other | Admitting: *Deleted

## 2020-02-07 ENCOUNTER — Other Ambulatory Visit: Payer: Self-pay | Admitting: Nurse Practitioner

## 2020-02-07 DIAGNOSIS — I10 Essential (primary) hypertension: Secondary | ICD-10-CM | POA: Diagnosis not present

## 2020-02-07 DIAGNOSIS — E1142 Type 2 diabetes mellitus with diabetic polyneuropathy: Secondary | ICD-10-CM | POA: Diagnosis not present

## 2020-02-07 NOTE — Patient Instructions (Signed)
Visit Information  Goals Addressed            This Visit's Progress   . Chronic Disease Management Needs       CARE PLAN ENTRY (see longtitudinal plan of care for additional care plan information)  Current Barriers:  . Chronic Disease Management support, education, and care coordination needs related to HTN, DM, osteoporosis, CKD, HLD, glaucoma  Clinical Goal(s) related to HTN, DM, osteoporosis, CKD, HLD, glaucoma:  Over the next 90 days, patient will:  . Work with the care management team to address educational, disease management, and care coordination needs  . Begin or continue self health monitoring activities as directed today Measure and record cbg (blood glucose) 2 times daily . Call provider office for new or worsened signs and symptoms Blood glucose findings outside established parameters . Call care management team with questions or concerns . Verbalize basic understanding of patient centered plan of care established today  Interventions related to HTN, DM, osteoporosis, CKD, HLD, glaucoma:  . Evaluation of current treatment plans and patient's adherence to plan as established by provider . Assessed patient understanding of disease states . Assessed patient's education and care coordination needs . Provided disease specific education to patient  . Collaborated with appropriate clinical care team members regarding patient needs . Reviewed upcoming appointments: PCP 02/21/20 . Discussed most recent A1C . Discussed diet  Patient Self Care Activities related to HTN, DM, osteoporosis, CKD, HLD, glaucoma:  . Patient is unable to independently self-manage chronic health conditions  Initial goal documentation        Catherine Cooper was given information about Chronic Care Management services today including:  1. CCM service includes personalized support from designated clinical staff supervised by her physician, including individualized plan of care and coordination with  other care providers 2. 24/7 contact phone numbers for assistance for urgent and routine care needs. 3. Service will only be billed when office clinical staff spend 20 minutes or more in a month to coordinate care. 4. Only one practitioner may furnish and bill the service in a calendar month. 5. The patient may stop CCM services at any time (effective at the end of the month) by phone call to the office staff. 6. The patient will be responsible for cost sharing (co-pay) of up to 20% of the service fee (after annual deductible is met).  Patient agreed to services and verbal consent obtained.   The patient verbalized understanding of instructions provided today and declined a print copy of patient instruction materials.   The care management team will reach out to the patient again over the next 90 days.   Chong Sicilian, BSN, RN-BC Embedded Chronic Care Manager [facility] Direct Dial: [phone]

## 2020-02-07 NOTE — Chronic Care Management (AMB) (Signed)
Chronic Care Management   Initial Visit Note  02/07/2020 Name: Catherine Cooper MRN: [id number] DOB: [birth year]  Referred by: Chevis Pretty, FNP Reason for referral : Chronic Care Management (Initial Visit)   Catherine Cooper is a 79 y.o. year old female who is a primary care patient of Chevis Pretty, FNP. The CCM team was consulted for assistance with chronic disease management and care coordination needs related to HTN, DM, osteoporosis, CKD, HLD, glaucoma.  Review of patient status, including review of consultants reports, relevant laboratory and other test results, and collaboration with appropriate care team members and the patient's provider was performed as part of comprehensive patient evaluation and provision of chronic care management services.    Subjective: I spoke with Catherine Cooper by telephone today regarding management of her chronic medical conditions. Catherine Cooper feels that she is managing her conditions well at this time but agrees to a follow-up call in 90 days.   SDOH (Social Determinants of Health) assessments performed: Yes See Care Plan activities for detailed interventions related to SDOH    Objective: Outpatient Encounter Medications as of 02/07/2020  Medication Sig  . ALPHAGAN P 0.1 % SOLN Place 1 drop into both eyes 2 (two) times daily.   Marland Kitchen aspirin 81 MG tablet Take 81 mg by mouth every evening.   . bifidobacterium infantis (ALIGN) capsule Take 1 capsule by mouth daily.  . calcium citrate-vitamin D (CITRACAL+D) 315-200 MG-UNIT tablet Take 1 tablet by mouth 2 (two) times daily.  . Cholecalciferol (VITAMIN D3) 2000 UNITS TABS Take 1 capsule by mouth daily.   . Dulaglutide (TRULICITY) A999333 0000000 SOPN Inject 0.75 mg into the skin once a week.  Marland Kitchen FARXIGA 10 MG TABS tablet Take 10 mg by mouth every morning.  . gabapentin (NEURONTIN) 100 MG capsule Take 1 capsule (100 mg total) by mouth daily.  Marland Kitchen glipiZIDE (GLUCOTROL XL) 10 MG 24 hr tablet Take 1  tablet (10 mg total) by mouth daily with breakfast.  . ibuprofen (ADVIL,MOTRIN) 800 MG tablet Take 1 tablet (800 mg total) by mouth every 6 (six) hours as needed for pain.  Marland Kitchen latanoprost (XALATAN) 0.005 % ophthalmic solution Place 1 drop into both eyes at bedtime.   Marland Kitchen LORazepam (ATIVAN) 0.5 MG tablet TAKE 1 TABLET TWICE DAILY AS NEEDED FOR ANXIETY OR SLEEP  . Multiple Vitamins-Minerals (PRESERVISION/LUTEIN PO) Take by mouth.  Marland Kitchen omeprazole (PRILOSEC) 40 MG capsule Take 1 capsule (40 mg total) by mouth daily.  Glory Rosebush ULTRA test strip TEST BLOOD SUGAR ONCE DAILYAND AS NEEDED  . PROLIA 60 MG/ML SOSY injection TO BE ADMINISTERED IN PHYSICIAN'S OFFICE. INJECT ONE SYRINGE SUBCOUSLY ONCE EVERY 6 MONTHS. REFRIGERATE. USE WITHIN 14 DAYS ONCE AT ROOM TEMPERATURE.  . SSD 1 % cream   . vitamin B-12 (CYANOCOBALAMIN) 1000 MCG tablet Take 1,000 mcg by mouth daily.     No facility-administered encounter medications on file as of 02/07/2020.    Lab Results  Component Value Date   HGBA1C 7.9 (H) 11/14/2019   HGBA1C 7.9 (H) 08/11/2019   HGBA1C 7.0 (H) 10/26/2018   Lab Results  Component Value Date   MICROALBUR 20 08/28/2015   LDLCALC 91 11/14/2019   CREATININE 0.96 11/14/2019   BP Readings from Last 3 Encounters:  11/14/19 136/69  08/11/19 118/69  10/26/18 120/62   Lab Results  Component Value Date   CHOL 156 11/14/2019   HDL 44 11/14/2019   LDLCALC 91 11/14/2019   TRIG 119 11/14/2019   CHOLHDL 3.5 11/14/2019  RN Care Plan   . Chronic Disease Management Needs       CARE PLAN ENTRY (see longtitudinal plan of care for additional care plan information)  Current Barriers:  . Chronic Disease Management support, education, and care coordination needs related to HTN, DM, osteoporosis, CKD, HLD, glaucoma  Clinical Goal(s) related to HTN, DM, osteoporosis, CKD, HLD, glaucoma:  Over the next 90 days, patient will:  . Work with the care management team to address educational, disease  management, and care coordination needs  . Begin or continue self health monitoring activities as directed today Measure and record cbg (blood glucose) 2 times daily . Call provider office for new or worsened signs and symptoms Blood glucose findings outside established parameters . Call care management team with questions or concerns . Verbalize basic understanding of patient centered plan of care established today  Interventions related to HTN, DM, osteoporosis, CKD, HLD, glaucoma:  . Evaluation of current treatment plans and patient's adherence to plan as established by provider . Assessed patient understanding of disease states . Assessed patient's education and care coordination needs . Provided disease specific education to patient  . Collaborated with appropriate clinical care team members regarding patient needs . Reviewed upcoming appointments: PCP 02/21/20 . Discussed most recent A1C . Discussed diet  Patient Self Care Activities related to HTN, DM, osteoporosis, CKD, HLD, glaucoma:  . Patient is unable to independently self-manage chronic health conditions  Initial goal documentation         Follow-up Plan:   The care management team will reach out to the patient again over the next 90 days.   Chong Sicilian, BSN, RN-BC Embedded Chronic Care Manager [facility] Direct Dial: [phone]

## 2020-02-13 ENCOUNTER — Other Ambulatory Visit: Payer: Self-pay

## 2020-02-13 MED ORDER — PROLIA 60 MG/ML ~~LOC~~ SOSY: PREFILLED_SYRINGE | SUBCUTANEOUS | 1 refills | Status: DC

## 2020-02-21 ENCOUNTER — Other Ambulatory Visit: Payer: Self-pay

## 2020-02-21 ENCOUNTER — Encounter: Payer: Self-pay | Admitting: Nurse Practitioner

## 2020-02-21 ENCOUNTER — Ambulatory Visit (INDEPENDENT_AMBULATORY_CARE_PROVIDER_SITE_OTHER): Payer: Medicare Other | Admitting: Nurse Practitioner

## 2020-02-21 VITALS — BP 131/70 | HR 87 | Temp 98.0°F | Resp 20 | Ht 65.0 in | Wt 164.0 lb

## 2020-02-21 DIAGNOSIS — F411 Generalized anxiety disorder: Secondary | ICD-10-CM

## 2020-02-21 DIAGNOSIS — E1142 Type 2 diabetes mellitus with diabetic polyneuropathy: Secondary | ICD-10-CM

## 2020-02-21 DIAGNOSIS — I10 Essential (primary) hypertension: Secondary | ICD-10-CM

## 2020-02-21 DIAGNOSIS — E782 Mixed hyperlipidemia: Secondary | ICD-10-CM

## 2020-02-21 DIAGNOSIS — K219 Gastro-esophageal reflux disease without esophagitis: Secondary | ICD-10-CM

## 2020-02-21 DIAGNOSIS — K591 Functional diarrhea: Secondary | ICD-10-CM

## 2020-02-21 DIAGNOSIS — N1831 Chronic kidney disease, stage 3a: Secondary | ICD-10-CM

## 2020-02-21 LAB — BAYER DCA HB A1C WAIVED: HB A1C (BAYER DCA - WAIVED): 7.9 % — ABNORMAL HIGH (ref <7.0)

## 2020-02-21 MED ORDER — GABAPENTIN 100 MG PO CAPS: 100.0000 mg | ORAL_CAPSULE | Freq: Every day | ORAL | 1 refills | Status: DC

## 2020-02-21 MED ORDER — LORAZEPAM 0.5 MG PO TABS: ORAL_TABLET | ORAL | 1 refills | Status: DC

## 2020-02-21 MED ORDER — TRULICITY 0.75 MG/0.5ML ~~LOC~~ SOAJ: SUBCUTANEOUS | 5 refills | Status: DC

## 2020-02-21 MED ORDER — ONETOUCH ULTRA VI STRP: ORAL_STRIP | 1 refills | Status: DC

## 2020-02-21 MED ORDER — GLIPIZIDE ER 10 MG PO TB24: 10.0000 mg | ORAL_TABLET | Freq: Every day | ORAL | 1 refills | Status: DC

## 2020-02-21 MED ORDER — FARXIGA 10 MG PO TABS: 10.0000 mg | ORAL_TABLET | Freq: Every morning | ORAL | 1 refills | Status: DC

## 2020-02-21 MED ORDER — OMEPRAZOLE 40 MG PO CPDR: 40.0000 mg | DELAYED_RELEASE_CAPSULE | Freq: Every day | ORAL | 3 refills | Status: DC

## 2020-02-21 NOTE — Patient Instructions (Signed)
Carbohydrate Counting for Diabetes Mellitus, Adult  Carbohydrate counting is a method of keeping track of how many carbohydrates you eat. Eating carbohydrates naturally increases the amount of sugar (glucose) in the blood. Counting how many carbohydrates you eat helps keep your blood glucose within normal limits, which helps you manage your diabetes (diabetes mellitus). It is important to know how many carbohydrates you can safely have in each meal. This is different for every person. A diet and nutrition specialist (registered dietitian) can help you make a meal plan and calculate how many carbohydrates you should have at each meal and snack. Carbohydrates are found in the following foods:  Grains, such as breads and cereals.  Dried beans and soy products.  Starchy vegetables, such as potatoes, peas, and corn.  Fruit and fruit juices.  Milk and yogurt.  Sweets and snack foods, such as cake, cookies, candy, chips, and soft drinks. How do I count carbohydrates? There are two ways to count carbohydrates in food. You can use either of the methods or a combination of both. Reading "Nutrition Facts" on packaged food The "Nutrition Facts" list is included on the labels of almost all packaged foods and beverages in the [place] It includes:  The serving size.  Information about nutrients in each serving, including the grams (g) of carbohydrate per serving. To use the "Nutrition Facts":  Decide how many servings you will have.  Multiply the number of servings by the number of carbohydrates per serving.  The resulting number is the total amount of carbohydrates that you will be having. Learning standard serving sizes of other foods When you eat carbohydrate foods that are not packaged or do not include "Nutrition Facts" on the label, you need to measure the servings in order to count the amount of carbohydrates:  Measure the foods that you will eat with a food scale or measuring cup, if  needed.  Decide how many standard-size servings you will eat.  Multiply the number of servings by 15. Most carbohydrate-rich foods have about 15 g of carbohydrates per serving. ? For example, if you eat 8 oz (170 g) of strawberries, you will have eaten 2 servings and 30 g of carbohydrates (2 servings x 15 g = 30 g).  For foods that have more than one food mixed, such as soups and casseroles, you must count the carbohydrates in each food that is included. The following list contains standard serving sizes of common carbohydrate-rich foods. Each of these servings has about 15 g of carbohydrates:   hamburger bun or  English muffin.   oz (15 mL) syrup.   oz (14 g) jelly.  1 slice of bread.  1 six-inch tortilla.  3 oz (85 g) cooked rice or pasta.  4 oz (113 g) cooked dried beans.  4 oz (113 g) starchy vegetable, such as peas, corn, or potatoes.  4 oz (113 g) hot cereal.  4 oz (113 g) mashed potatoes or  of a large baked potato.  4 oz (113 g) canned or frozen fruit.  4 oz (120 mL) fruit juice.  4-6 crackers.  6 chicken nuggets.  6 oz (170 g) unsweetened dry cereal.  6 oz (170 g) plain fat-free yogurt or yogurt sweetened with artificial sweeteners.  8 oz (240 mL) milk.  8 oz (170 g) fresh fruit or one small piece of fruit.  24 oz (680 g) popped popcorn. Example of carbohydrate counting Sample meal  3 oz (85 g) chicken breast.  6 oz (170 g)   brown rice.  4 oz (113 g) corn.  8 oz (240 mL) milk.  8 oz (170 g) strawberries with sugar-free whipped topping. Carbohydrate calculation 1. Identify the foods that contain carbohydrates: ? Rice. ? Corn. ? Milk. ? Strawberries. 2. Calculate how many servings you have of each food: ? 2 servings rice. ? 1 serving corn. ? 1 serving milk. ? 1 serving strawberries. 3. Multiply each number of servings by 15 g: ? 2 servings rice x 15 g = 30 g. ? 1 serving corn x 15 g = 15 g. ? 1 serving milk x 15 g = 15 g. ? 1  serving strawberries x 15 g = 15 g. 4. Add together all of the amounts to find the total grams of carbohydrates eaten: ? 30 g + 15 g + 15 g + 15 g = 75 g of carbohydrates total. Summary  Carbohydrate counting is a method of keeping track of how many carbohydrates you eat.  Eating carbohydrates naturally increases the amount of sugar (glucose) in the blood.  Counting how many carbohydrates you eat helps keep your blood glucose within normal limits, which helps you manage your diabetes.  A diet and nutrition specialist (registered dietitian) can help you make a meal plan and calculate how many carbohydrates you should have at each meal and snack. This information is not intended to replace advice given to you by your health care provider. Make sure you discuss any questions you have with your health care provider. Document Revised: 04/23/2017 Document Reviewed: 03/12/2016 Elsevier Patient Education  2020 [employer]  

## 2020-02-21 NOTE — Progress Notes (Signed)
Subjective:    Patient ID: Catherine Cooper, female    DOB: November 05, 1940, 79 y.o.   MRN: [id number]   Chief Complaint: medical management of chronic issues   HPI:  1. Essential hypertension No c/o chest pain, sob or headache. Does not check blood pressure at home. BP Readings from Last 3 Encounters:  11/14/19 136/69  08/11/19 118/69  10/26/18 120/62     2. Mixed hyperlipidemia Tries to watch diet and does some exercise. Lab Results  Component Value Date   CHOL 156 11/14/2019   HDL 44 11/14/2019   LDLCALC 91 11/14/2019   TRIG 119 11/14/2019   CHOLHDL 3.5 11/14/2019     3. Type 2 diabetes mellitus with diabetic polyneuropathy, without long-term current use of insulin (HCC) Fasting  Blood sugars running 130-200. Mostly around 138-150 range. She denies any low blood sugars. Lab Results  Component Value Date   HGBA1C 7.9 (H) 11/14/2019     4. 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  5. Functional diarrhea Still has daily diarrhea, but she ays she can deal with it.  6. Stage 3a chronic kidney disease Lab Results  Component Value Date   CREATININE 0.96 11/14/2019     7. GAD (generalized anxiety disorder) Is on lorazepam prn. Usually takes 1-2 x a week. Doe not need daily. GAD 7 : Generalized Anxiety Score 02/21/2020 11/14/2019 08/11/2019  Nervous, Anxious, on Edge - 1 2  Control/stop worrying 0 0 1  Worry too much - different things 0 0 1  Trouble relaxing 0 0 1  Restless 0 0 1  Easily annoyed or irritable 0 0 0  Afraid - awful might happen 0 0 0  Total GAD 7 Score - 1 6  Anxiety Difficulty Not difficult at all Not difficult at all Somewhat difficult        Outpatient Encounter Medications as of 02/21/2020  Medication Sig  . PROLIA 60 MG/ML SOSY injection INJECT 1 SYRINGE INTO THE SKIN ONCE FOR 1 DOSE.  Marland Kitchen ALPHAGAN P 0.1 % SOLN Place 1 drop into both  eyes 2 (two) times daily.   Marland Kitchen aspirin 81 MG tablet Take 81 mg by mouth every evening.   . bifidobacterium infantis (ALIGN) capsule Take 1 capsule by mouth daily.  . calcium citrate-vitamin D (CITRACAL+D) 315-200 MG-UNIT tablet Take 1 tablet by mouth 2 (two) times daily.  . Cholecalciferol (VITAMIN D3) 2000 UNITS TABS Take 1 capsule by mouth daily.   Marland Kitchen denosumab (PROLIA) 60 MG/ML SOSY injection TO BE ADMINISTERED IN PHYSICIAN'S OFFICE. INJECT ONE SYRINGE SUBCOUSLY ONCE EVERY 6 MONTHS. REFRIGERATE. USE WITHIN 14 DAYS ONCE AT ROOM TEMPERATURE.  . Dulaglutide (TRULICITY) 6.94 HW/3.8UE SOPN Inject 0.75 mg into the skin once a week.  Marland Kitchen FARXIGA 10 MG TABS tablet Take 10 mg by mouth every morning.  . gabapentin (NEURONTIN) 100 MG capsule Take 1 capsule (100 mg total) by mouth daily.  Marland Kitchen glipiZIDE (GLUCOTROL XL) 10 MG 24 hr tablet Take 1 tablet (10 mg total) by mouth daily with breakfast.  . ibuprofen (ADVIL,MOTRIN) 800 MG tablet Take 1 tablet (800 mg total) by mouth every 6 (six) hours as needed for pain.  Marland Kitchen latanoprost (XALATAN) 0.005 % ophthalmic solution Place 1 drop into both eyes at bedtime.   Marland Kitchen LORazepam (ATIVAN) 0.5 MG tablet TAKE 1 TABLET TWICE DAILY AS NEEDED FOR ANXIETY OR SLEEP  . Multiple Vitamins-Minerals (PRESERVISION/LUTEIN  PO) Take by mouth.  Marland Kitchen omeprazole (PRILOSEC) 40 MG capsule Take 1 capsule (40 mg total) by mouth daily.  Glory Rosebush ULTRA test strip TEST BLOOD SUGAR ONCE DAILYAND AS NEEDED  . SSD 1 % cream   . vitamin B-12 (CYANOCOBALAMIN) 1000 MCG tablet Take 1,000 mcg by mouth daily.      Past Surgical History:  Procedure Laterality Date  .  vitrectomy  02/23/08   posterior; right eye  . ABDOMINAL HYSTERECTOMY    . ACHILLES TENDON SURGERY Left 03/28/2016   Procedure: Reconstruction Left Achilles;  Surgeon: Newt Minion, MD;  Location: [facility];  Service: Orthopedics;  Laterality: Left;  . 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 COLONOSCOPY  2013  . EYE SURGERY    . FOOT SURGERY     Right foot / left heel  . glaucome surgery right eye    . SKIN GRAFT Right 2007   foot  . TUBAL LIGATION      Family History  Problem Relation Age of Onset  . Dementia Father   . Diabetes Father   . Coronary artery disease Father 66  . Hip fracture Father   . Alzheimer's disease Father   . Atrial fibrillation Mother   . Osteoporosis Mother     New complaints: None today  Social history: live with huband  Controlled substance contract: 08/16/19    Review of Systems  Constitutional: Negative for diaphoresis.  Eyes: Negative for pain.  Respiratory: Negative for shortness of breath.   Cardiovascular: Negative for chest pain, palpitations and leg swelling.  Gastrointestinal: Negative for abdominal pain.  Endocrine: Negative for polydipsia.  Skin: Negative for rash.  Neurological: Negative for dizziness, weakness and headaches.  Hematological: Does not bruise/bleed easily.  All other systems reviewed and are negative.      Objective:   Physical Exam Vitals and nursing note reviewed.  Constitutional:      General: She is not in acute distress.    Appearance: Normal appearance. She is well-developed.  HENT:     Head: Normocephalic.     Nose: Nose normal.  Eyes:     Pupils: Pupils are equal, round, and reactive to light.  Neck:     Vascular: No carotid bruit or JVD.  Cardiovascular:     Rate and Rhythm: Normal rate and regular rhythm.     Heart sounds: Normal heart sounds.  Pulmonary:     Effort: Pulmonary effort is normal. No respiratory distress.     Breath sounds: Normal breath sounds. No wheezing or rales.  Chest:     Chest wall: No tenderness.  Abdominal:     General: Bowel sounds are normal. There is no distension or abdominal bruit.     Palpations: Abdomen is soft. There is no hepatomegaly, splenomegaly, mass or pulsatile  mass.     Tenderness: There is no abdominal tenderness.  Musculoskeletal:        General: Normal range of motion.     Cervical back: Normal range of motion and neck supple.  Lymphadenopathy:     Cervical: No cervical adenopathy.  Skin:    General: Skin is warm and dry.  Neurological:     Mental Status: She is alert and oriented to person, place, and time.     Deep Tendon Reflexes: Reflexes are normal and symmetric.  Psychiatric:  Behavior: Behavior normal.        Thought Content: Thought content normal.        Judgment: Judgment normal.     Blood pressure 131/70, pulse 87, temperature 98 F (36.7 C), temperature source Temporal, resp. rate 20, height 5' 5"  (1.651 m), weight 164 lb (74.4 kg), SpO2 98 %.  hgba1c 7.9%      Assessment & Plan:  JENNAH SATCHELL comes in today with chief complaint of Medical Management of Chronic Issues   Diagnosis and orders addressed:  1. Essential hypertension Low odium diet - CBC with Differential/Platelet - CMP14+EGFR  2. Mixed hyperlipidemia Low fat diet - Lipid panel  3. Type 2 diabetes mellitus with diabetic polyneuropathy, without long-term current use of insulin (HCC) stricter carb counting - Bayer DCA Hb A1c Waived - Microalbumin / creatinine urine ratio - FARXIGA 10 MG TABS tablet; Take 10 mg by mouth every morning.  Dispense: 90 tablet; Refill: 1 - gabapentin (NEURONTIN) 100 MG capsule; Take 1 capsule (100 mg total) by mouth daily.  Dispense: 90 capsule; Refill: 1 - glipiZIDE (GLUCOTROL XL) 10 MG 24 hr tablet; Take 1 tablet (10 mg total) by mouth daily with breakfast.  Dispense: 90 tablet; Refill: 1 - Dulaglutide (TRULICITY) 4.46 KM/6.3OT SOPN; Inject 0.75 mg into the skin once a week.  Dispense: 2 mL; Refill: 5 - ONETOUCH ULTRA test strip; TEST BLOOD SUGAR ONCE DAILYAND AS NEEDED  Dispense: 100 strip; Refill: 1  4. Gastroesophageal reflux disease without esophagitis Avoid spicy foods Do not eat 2 hours prior to  bedtime - omeprazole (PRILOSEC) 40 MG capsule; Take 1 capsule (40 mg total) by mouth daily.  Dispense: 30 capsule; Refill: 3  5. Functional diarrhea Imodium AD OTC as needed  6. Stage 3a chronic kidney disease Lab spending  7. GAD (generalized anxiety disorder) stress management - LORazepam (ATIVAN) 0.5 MG tablet; TAKE 1 TABLET TWICE DAILY AS NEEDED FOR ANXIETY OR SLEEP  Dispense: 60 tablet; Refill: 1   Labs pending Health Maintenance reviewed Diet and exercise encouraged  Follow up plan: 3 months   Mary-Margaret Hassell Done, FNP

## 2020-02-22 LAB — CBC WITH DIFFERENTIAL/PLATELET
Basophils Absolute: 0.1 10*3/uL (ref 0.0–0.2)
Basos: 1 %
EOS (ABSOLUTE): 0.3 10*3/uL (ref 0.0–0.4)
Eos: 6 %
Hematocrit: 41.2 % (ref 34.0–46.6)
Hemoglobin: 13.5 g/dL (ref 11.1–15.9)
Immature Grans (Abs): 0 10*3/uL (ref 0.0–0.1)
Immature Granulocytes: 0 %
Lymphocytes Absolute: 1.7 10*3/uL (ref 0.7–3.1)
Lymphs: 32 %
MCH: 31.6 pg (ref 26.6–33.0)
MCHC: 32.8 g/dL (ref 31.5–35.7)
MCV: 97 fL (ref 79–97)
Monocytes Absolute: 0.4 10*3/uL (ref 0.1–0.9)
Monocytes: 8 %
Neutrophils Absolute: 2.8 10*3/uL (ref 1.4–7.0)
Neutrophils: 53 %
Platelets: 155 10*3/uL (ref 150–450)
RBC: 4.27 x10E6/uL (ref 3.77–5.28)
RDW: 12 % (ref 11.7–15.4)
WBC: 5.4 10*3/uL (ref 3.4–10.8)

## 2020-02-22 LAB — LIPID PANEL
Chol/HDL Ratio: 4 ratio (ref 0.0–4.4)
Cholesterol, Total: 178 mg/dL (ref 100–199)
HDL: 45 mg/dL (ref >39)
LDL Chol Calc (NIH): 99 mg/dL (ref 0–99)
Triglycerides: 198 mg/dL — ABNORMAL HIGH (ref 0–149)
VLDL Cholesterol Cal: 34 mg/dL (ref 5–40)

## 2020-02-22 LAB — CMP14+EGFR
ALT: 16 IU/L (ref 0–32)
AST: 18 IU/L (ref 0–40)
Albumin/Globulin Ratio: 1.8 (ref 1.2–2.2)
Albumin: 4.2 g/dL (ref 3.7–4.7)
Alkaline Phosphatase: 80 IU/L (ref 39–117)
BUN/Creatinine Ratio: 13 (ref 12–28)
BUN: 13 mg/dL (ref 8–27)
Bilirubin Total: 0.5 mg/dL (ref 0.0–1.2)
CO2: 25 mmol/L (ref 20–29)
Calcium: 8.8 mg/dL (ref 8.7–10.3)
Chloride: 103 mmol/L (ref 96–106)
Creatinine, Ser: 0.97 mg/dL (ref 0.57–1.00)
GFR calc Af Amer: 64 mL/min/{1.73_m2} (ref >59)
GFR calc non Af Amer: 56 mL/min/{1.73_m2} — ABNORMAL LOW (ref >59)
Globulin, Total: 2.3 g/dL (ref 1.5–4.5)
Glucose: 172 mg/dL — ABNORMAL HIGH (ref 65–99)
Potassium: 4.1 mmol/L (ref 3.5–5.2)
Sodium: 141 mmol/L (ref 134–144)
Total Protein: 6.5 g/dL (ref 6.0–8.5)

## 2020-02-22 LAB — MICROALBUMIN / CREATININE URINE RATIO
Creatinine, Urine: 82.9 mg/dL
Microalb/Creat Ratio: 16 mg/g creat (ref 0–29)
Microalbumin, Urine: 13.2 ug/mL

## 2020-03-19 DIAGNOSIS — H16142 Punctate keratitis, left eye: Secondary | ICD-10-CM | POA: Diagnosis not present

## 2020-03-19 DIAGNOSIS — H5712 Ocular pain, left eye: Secondary | ICD-10-CM | POA: Diagnosis not present

## 2020-03-19 DIAGNOSIS — H401221 Low-tension glaucoma, left eye, mild stage: Secondary | ICD-10-CM | POA: Diagnosis not present

## 2020-03-19 DIAGNOSIS — H401212 Low-tension glaucoma, right eye, moderate stage: Secondary | ICD-10-CM | POA: Diagnosis not present

## 2020-03-19 LAB — HM DIABETES EYE EXAM

## 2020-03-20 ENCOUNTER — Ambulatory Visit (INDEPENDENT_AMBULATORY_CARE_PROVIDER_SITE_OTHER): Payer: Medicare Other | Admitting: Dermatology

## 2020-03-20 ENCOUNTER — Encounter: Payer: Self-pay | Admitting: Dermatology

## 2020-03-20 ENCOUNTER — Other Ambulatory Visit: Payer: Self-pay

## 2020-03-20 DIAGNOSIS — D0439 Carcinoma in situ of skin of other parts of face: Secondary | ICD-10-CM | POA: Diagnosis not present

## 2020-03-20 DIAGNOSIS — Z85828 Personal history of other malignant neoplasm of skin: Secondary | ICD-10-CM

## 2020-03-20 DIAGNOSIS — D485 Neoplasm of uncertain behavior of skin: Secondary | ICD-10-CM

## 2020-03-20 DIAGNOSIS — L989 Disorder of the skin and subcutaneous tissue, unspecified: Secondary | ICD-10-CM | POA: Diagnosis not present

## 2020-03-20 DIAGNOSIS — C4492 Squamous cell carcinoma of skin, unspecified: Secondary | ICD-10-CM

## 2020-03-20 HISTORY — DX: Squamous cell carcinoma of skin, unspecified: C44.92

## 2020-03-20 NOTE — Patient Instructions (Signed)

## 2020-03-21 ENCOUNTER — Telehealth: Payer: Self-pay | Admitting: *Deleted

## 2020-03-21 NOTE — Telephone Encounter (Signed)
Left message to return our phone call: calling to inform patient that per [facility] specimen was missing from Right upper nose - they said they looked all over for specimen: per Dr. Denna Haggard  Patient needs no charge office visit for re-biopsy because that spot was previous skin cancer and already treated so could be recurrent.  I was calling to let patient know, apologize and schedule for another biopsy.

## 2020-03-27 ENCOUNTER — Telehealth: Payer: Self-pay | Admitting: *Deleted

## 2020-03-27 ENCOUNTER — Encounter: Payer: Self-pay | Admitting: *Deleted

## 2020-03-27 NOTE — Telephone Encounter (Signed)
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 Darene Lamer; at that time I will likely rebiopsy the spot to the right of the nose

## 2020-03-27 NOTE — Telephone Encounter (Signed)
Pathology to patient. Surgery appointment 05/10/2020: and Dr Denna Haggard will re-biospy right upper nose at same visit.

## 2020-03-29 DIAGNOSIS — M79676 Pain in unspecified toe(s): Secondary | ICD-10-CM | POA: Diagnosis not present

## 2020-03-29 DIAGNOSIS — B351 Tinea unguium: Secondary | ICD-10-CM | POA: Diagnosis not present

## 2020-04-14 ENCOUNTER — Encounter: Payer: Self-pay | Admitting: Dermatology

## 2020-04-14 NOTE — Progress Notes (Signed)
   Follow-Up Visit   Subjective  Catherine Cooper is a 79 y.o. female who presents for the following: Skin Problem (right upper nose- came back x 6 months- tx 04-16-2017).  Growth Location: Right mid nose Duration:  Quality:  Associated Signs/Symptoms: Modifying Factors:  Severity:  Timing: Context: History of skin cancer 2018 in this area.  The following portions of the chart were reviewed this encounter and updated as appropriate: Tobacco  Allergies  Meds  Problems  Med Hx  Surg Hx  Fam Hx      Objective  Well appearing patient in no apparent distress; mood and affect are within normal limits.  A focused examination was performed including Head, neck, and arms.. Relevant physical exam findings are noted in the Assessment and Plan.   Assessment & Plan  Neoplasm of uncertain behavior of skin (2) Right Upper Nose  Skin / nail biopsy Type of biopsy: tangential   Informed consent: discussed and consent obtained   Timeout: patient name, date of birth, surgical site, and procedure verified   Procedure prep:  Patient was prepped and draped in usual sterile fashion Prep type:  Chlorhexidine Anesthesia: the lesion was anesthetized in a standard fashion   Anesthetic:  1% lidocaine w/ epinephrine 1-100,000 local infiltration Instrument used: flexible razor blade   Hemostasis achieved with: ferric subsulfate   Outcome: patient tolerated procedure well   Post-procedure details: wound care instructions given    Specimen 1 - Surgical pathology Differential Diagnosis: bcc vs scc Check Margins: No Previous bx- [id number]  Dorsum of Nose  Skin / nail biopsy Type of biopsy: tangential   Informed consent: discussed and consent obtained   Timeout: patient name, date of birth, surgical site, and procedure verified   Procedure prep:  Patient was prepped and draped in usual sterile fashion Prep type:  Chlorhexidine Anesthesia: the lesion was anesthetized in a standard fashion     Anesthetic:  1% lidocaine w/ epinephrine 1-100,000 local infiltration Instrument used: flexible razor blade   Hemostasis achieved with: ferric subsulfate   Outcome: patient tolerated procedure well   Post-procedure details: wound care instructions given    Specimen 2 - Surgical pathology Differential Diagnosis: bcc vs scc Check Margins: No

## 2020-05-10 ENCOUNTER — Ambulatory Visit (INDEPENDENT_AMBULATORY_CARE_PROVIDER_SITE_OTHER): Payer: Medicare Other | Admitting: Dermatology

## 2020-05-10 ENCOUNTER — Other Ambulatory Visit: Payer: Self-pay

## 2020-05-10 ENCOUNTER — Encounter: Payer: Self-pay | Admitting: Dermatology

## 2020-05-10 DIAGNOSIS — C44311 Basal cell carcinoma of skin of nose: Secondary | ICD-10-CM | POA: Diagnosis not present

## 2020-05-10 DIAGNOSIS — D0439 Carcinoma in situ of skin of other parts of face: Secondary | ICD-10-CM | POA: Diagnosis not present

## 2020-05-10 DIAGNOSIS — D485 Neoplasm of uncertain behavior of skin: Secondary | ICD-10-CM

## 2020-05-10 DIAGNOSIS — D099 Carcinoma in situ, unspecified: Secondary | ICD-10-CM

## 2020-05-10 NOTE — Patient Instructions (Addendum)
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  This lesion proved to be quite superficial so you can expect an abrasion on this area for 2 weeks with no stitches.  It is okay to do full activities beyond avoiding a sunburn.  After washing please apply a little Vaseline or, if you are not allergic, triple antibiotic ointment daily for the next 1 to 2 weeks.  If you have any problems my cell phone number is [phone].  Additionally the laboratory was unable to give [place] a report on the biopsy on the right side of the nose so this was resampled.  I have asked Mrs. Simmonds to please call my office next week to let me know the surgical site is doing fine and so that we may share the biopsy result from the right side of the nose.  She knows she can contact me sooner if there is any issue.

## 2020-05-16 ENCOUNTER — Telehealth: Payer: Self-pay

## 2020-05-16 NOTE — Telephone Encounter (Signed)
Pathology given to patient and is aware of MOHS

## 2020-05-16 NOTE — Telephone Encounter (Signed)
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 Darene Lamer would prefer Mohs surgery

## 2020-05-28 ENCOUNTER — Ambulatory Visit (INDEPENDENT_AMBULATORY_CARE_PROVIDER_SITE_OTHER): Payer: Medicare Other

## 2020-05-28 DIAGNOSIS — Z0000 Encounter for general adult medical examination without abnormal findings: Secondary | ICD-10-CM

## 2020-05-28 NOTE — Progress Notes (Signed)
MEDICARE ANNUAL WELLNESS VISIT  05/28/2020  Telephone Visit Disclaimer This Medicare AWV was conducted by telephone due to national recommendations for restrictions regarding the COVID-19 Pandemic (e.g. social distancing).  I verified, using two identifiers, that I am speaking with Catherine Cooper or their authorized healthcare agent. I discussed the limitations, risks, security, and privacy concerns of performing an evaluation and management service by telephone and the potential availability of an in-person appointment in the future. The patient expressed understanding and agreed to proceed.   Subjective:  Catherine Cooper is a 79 y.o. female patient of Chevis Pretty, [place] who had a Medicare Annual Wellness Visit today via telephone. Catherine Cooper is Retired and lives with their spouse. she has three children. she reports that she is socially active and does interact with friends/family regularly. she is not physically active and enjoys sewing.  Patient Care Team: Chevis Pretty, FNP as PCP - General (Nurse Practitioner) Steffanie Rainwater, DPM as Consulting Physician (Podiatry) Zadie Rhine, Clent Demark, MD as Consulting Physician (Ophthalmology) Monna Fam, MD as Consulting Physician (Ophthalmology) Teena Irani, MD (Inactive) as Consulting Physician (Gastroenterology) Newt Minion, MD as Consulting Physician (Orthopedic Surgery) Ilean China, RN as Registered Nurse Lavonna Monarch, MD as Consulting Physician (Dermatology)  Advanced Directives 05/28/2020 05/19/2019 03/28/2016 07/18/2015 05/09/2015 02/14/2015 10/25/2014  Does Patient Have a Medical Advance Directive? No No No No No No No  Would patient like information on creating a medical advance directive? No - Patient declined No - Patient declined No - patient declined information No - patient declined information - No - patient declined information Yes - Educational materials given    Hospital Utilization Over the Past 12 Months: # of  hospitalizations or ER visits: 0 # of surgeries: 1  Review of Systems    Patient reports that her overall health is unchanged compared to last year.  Patient Reported Readings (BP, Pulse, CBG-176, Weight, etc)   Pain Assessment Pain : No/denies pain     Current Medications & Allergies (verified) Allergies as of 05/28/2020      Reactions   Jardiance [empagliflozin] Other (See Comments)   Legs eak   Lipitor [atorvastatin]    fatique and myalgia   Penicillins Rash      Medication List       Accurate as of May 28, 2020  8:37 AM. If you have any questions, ask your nurse or doctor.        STOP taking these medications   omeprazole 40 MG capsule Commonly known as: PRILOSEC     TAKE these medications   Alphagan P 0.1 % Soln Generic drug: brimonidine Place 1 drop into both eyes 2 (two) times daily.   aspirin 81 MG tablet Take 81 mg by mouth every evening.   bifidobacterium infantis capsule Take 1 capsule by mouth daily.   calcium citrate-vitamin D 315-200 MG-UNIT tablet Commonly known as: CITRACAL+D Take 1 tablet by mouth 2 (two) times daily.   Dexilant 60 MG capsule Generic drug: dexlansoprazole Take 1 capsule by mouth daily.   Farxiga 10 MG Tabs tablet Generic drug: dapagliflozin propanediol Take 10 mg by mouth every morning.   gabapentin 100 MG capsule Commonly known as: NEURONTIN Take 1 capsule (100 mg total) by mouth daily.   glipiZIDE 10 MG 24 hr tablet Commonly known as: GLUCOTROL XL Take 1 tablet (10 mg total) by mouth daily with breakfast.   ibuprofen 800 MG tablet Commonly known as: ADVIL Take 1 tablet (800 mg total) by  mouth every 6 (six) hours as needed for pain.   latanoprost 0.005 % ophthalmic solution Commonly known as: XALATAN Place 1 drop into both eyes at bedtime.   LORazepam 0.5 MG tablet Commonly known as: ATIVAN TAKE 1 TABLET TWICE DAILY AS NEEDED FOR ANXIETY OR SLEEP   OneTouch Ultra test strip Generic drug: glucose  blood TEST BLOOD SUGAR ONCE DAILYAND AS NEEDED   PRESERVISION/LUTEIN PO Take by mouth.   Prolia 60 MG/ML Sosy injection Generic drug: denosumab INJECT 1 SYRINGE INTO THE SKIN ONCE FOR 1 DOSE.   SSD 1 % cream Generic drug: silver sulfADIAZINE   Trulicity 1.61 WR/6.0AV Sopn Generic drug: Dulaglutide Inject 0.75 mg into the skin once a week.   vitamin B-12 1000 MCG tablet Commonly known as: CYANOCOBALAMIN Take 1,000 mcg by mouth daily.   Vitamin D3 50 MCG (2000 UT) Tabs Take 1 capsule by mouth daily.       History (reviewed): Past Medical History:  Diagnosis Date  . Achilles rupture, left   . Anxiety   . Cataracts, bilateral   . 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 Diabetes mellitus    type II  . Diabetic neuropathy ([place])   . Diarrhea    attributed to diabetic meds  . Endometrioid adenocarcinoma   . GAD (generalized anxiety disorder)   . GERD (gastroesophageal reflux disease)   . Glaucoma   . Hyperlipidemia    not on medications  . Iron deficiency   . Nephrolithiasis 2000  . Neuropathy    both feet  . Osteoporosis   . Shingles 2004  . 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  vitrectomy  02/23/08   posterior; right eye  . ABDOMINAL HYSTERECTOMY    . ACHILLES TENDON SURGERY Left 03/28/2016   Procedure: Reconstruction Left Achilles;  Surgeon: Newt Minion, MD;  Location: [facility];  Service: Orthopedics;  Laterality: Left;  . 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 COLONOSCOPY  2013  . EYE SURGERY    . FOOT SURGERY     Right foot / left heel  . glaucome surgery right eye    . SKIN GRAFT Right 2007   foot  . TUBAL LIGATION      Family History  Problem Relation Age of Onset  . Dementia Father   . Diabetes Father   . Coronary artery disease Father 88  . Hip fracture Father   . Alzheimer's disease Father   . Atrial fibrillation Mother   . Osteoporosis Mother    Social History   Socioeconomic History  . Marital status: Married    Spouse name: Catherine Cooper  . Number of children: 3  . Years of education: 82  . Highest education level: Some college, no degree  Occupational History  . Occupation: retired    Comment: [employer]  Tobacco Use  . Smoking status: Never Smoker  . Smokeless tobacco: Never Used  Vaping Use  . Vaping Use: Never used  Substance and Sexual Activity  . Alcohol use: No  . Drug use: No  . Sexual activity: Not on file  Other Topics Concern  .  Not on file  Social History Narrative   2 healthy daughters and 1 son deceased (suicide @ 32)   Social Determinants of Health   Financial Resource Strain: Low Risk   . Difficulty of Paying Living Expenses: Not hard at all  Food Insecurity: No Food Insecurity  . Worried About Charity fundraiser in the Last Year: Never true  . Ran Out of Food in the Last Year: Never true  Transportation Needs: No Transportation Needs  . Lack of Transportation (Medical): No  . Lack of Transportation (Non-Medical): No  Physical Activity: Inactive  . Days of Exercise per Week: 0 days  . Minutes of Exercise per Session: 0 min  Stress: No Stress Concern Present  . Feeling of Stress : Only a little  Social Connections: Moderately Integrated  . Frequency of Communication with Friends and Family: More than three times a week  . Frequency of Social Gatherings with Friends and Family: More than three times a week  . Attends Religious Services: More than 4 times per year  . Active Member of Clubs or Organizations: No  . Attends Archivist Meetings: Never  . Marital Status: Married    Activities of Daily Living In your present state  of health, do you have any difficulty performing the following activities: 05/28/2020 02/07/2020  Hearing? N N  Vision? N N  Comment - cataract surgery, wears  Difficulty concentrating or making decisions? N N  Walking or climbing stairs? N N  Dressing or bathing? N N  Doing errands, shopping? N N  Preparing Food and eating ? N N  Using the Toilet? N N  In the past six months, have you accidently leaked urine? N N  Do you have problems with loss of bowel control? N N  Managing your Medications? N N  Managing your Finances? N N  Housekeeping or managing your Housekeeping? N N  Some recent data might be hidden    Patient Education/ Literacy How often do you need to have someone help you when you read instructions, pamphlets, or other written materials from your doctor or pharmacy?: 1 - Never What is the last grade level you completed in school?: Some College  Exercise Current Exercise Habits: Home exercise routine;The patient does not participate in regular exercise at present, Exercise limited by: None identified  Diet Patient reports consuming 3 meals a day and 2 snack(s) a day Patient reports that her primary diet is: Regular Patient reports that she does have regular access to food.   Depression Screen PHQ 2/9 Scores 05/28/2020 02/21/2020 11/14/2019 08/11/2019 05/19/2019 10/26/2018 04/06/2018  PHQ - 2 Score 0 0 0 0 0 0 0     Fall Risk Fall Risk  05/28/2020 02/21/2020 11/14/2019 08/11/2019 05/19/2019  Falls in the past year? 0 0 0 0 0  Number falls in past yr: - - - - -  Injury with Fall? - - - - -     Objective:  Catherine Cooper seemed alert and oriented and she participated appropriately during our telephone visit.  Blood Pressure Weight BMI  BP Readings from Last 3 Encounters:  02/21/20 131/70  11/14/19 136/69  08/11/19 118/69   Wt Readings from Last 3 Encounters:  02/21/20 164 lb (74.4 kg)  02/06/20 165 lb (74.8 kg)  12/22/19 165 lb (74.8 kg)   BMI Readings from Last 1  Encounters:  02/21/20 27.29 kg/m    *Unable to obtain current vital signs, weight, and BMI due to telephone  visit type  Hearing/Vision  . Catherine Cooper did not seem to have difficulty with hearing/understanding during the telephone conversation . Reports that she has had a formal eye exam by an eye care professional within the past year . Reports that she has not had a formal hearing evaluation within the past year *Unable to fully assess hearing and vision during telephone visit type  Cognitive Function: 6CIT Screen 05/28/2020 05/19/2019  What Year? 0 points 0 points  What month? 0 points 0 points  What time? 0 points 0 points  Count back from 20 0 points 0 points  Months in reverse 0 points 0 points  Repeat phrase 0 points 2 points  Total Score 0 2   (Normal:0-7, Significant for Dysfunction: >8)  Normal Cognitive Function Screening: Yes   Immunization & Health Maintenance Record Immunization History  Administered Date(s) Administered  . Fluad Quad(high Dose 65+) 08/11/2019  . Influenza Whole 08/12/2010  . Influenza, High Dose Seasonal PF 08/21/2016, 07/14/2017, 07/12/2018  . Influenza,inj,Quad PF,6+ Mos 07/04/2013, 07/20/2014, 07/18/2015  . Moderna SARS-COVID-2 Vaccination 12/12/2019, 01/09/2020  . Pneumococcal Conjugate-13 01/31/2015  . Pneumococcal Polysaccharide-23 08/12/2010  . Zoster 04/12/2014    Health Maintenance  Topic Date Due  . DEXA SCAN  12/29/2019  . FOOT EXAM  05/01/2020  . INFLUENZA VACCINE  05/13/2020  . MAMMOGRAM  06/28/2020  . HEMOGLOBIN A1C  08/23/2020  . TETANUS/TDAP  02/10/2021  . URINE MICROALBUMIN  02/20/2021  . OPHTHALMOLOGY EXAM  03/19/2021  . COVID-19 Vaccine  Completed  . PNA vac Low Risk Adult  Completed       Assessment  This is a routine wellness examination for Catherine Cooper.  Health Maintenance: Due or Overdue Health Maintenance Due  Topic Date Due  . DEXA SCAN  12/29/2019  . FOOT EXAM  05/01/2020  . INFLUENZA VACCINE  05/13/2020     Catherine Cooper does not need a referral for Community Assistance: Care Management:   no Social Work:    no Prescription Assistance:  no Nutrition/Diabetes Education:  no   Plan:  Personalized Goals Goals Addressed            This Visit's Progress   . Exercise 3x per week (30 min per time)   Not on track    Try to exercise for at least 30 minutes three times weekly.  Seated exercise     . Have 3 meals a day   On track    Eat 3 meals daily that consist of lean proteins, fruits, and vegetables.     . Patient Stated       She would like to complete the removal of the SCC on her nose. Patient is awaiting a telephone call from the surgical center with an appointment date/time.      Personalized Health Maintenance & Screening Recommendations   Lung Cancer Screening Recommended: no (Low Dose CT Chest recommended if Age 63-80 years, 30 pack-year currently smoking OR have quit w/in past 15 years) Hepatitis C Screening recommended: no HIV Screening recommended: no  Advanced Directives: Written information was not prepared per patient's request.  Referrals & Orders No orders of the defined types were placed in this encounter.   Follow-up Plan . Follow-up with Chevis Pretty, FNP as planned . Schedule 06/11/2020    I have personally reviewed and noted the following in the patient's chart:   . Medical and social history . Use of alcohol, tobacco or illicit drugs  . Current medications and supplements .  Functional ability and status . Nutritional status . Physical activity . Advanced directives . List of other physicians . Hospitalizations, surgeries, and ER visits in previous 12 months . Vitals . Screenings to include cognitive, depression, and falls . Referrals and appointments  In addition, I have reviewed and discussed with Catherine Cooper certain preventive protocols, quality metrics, and best practice recommendations. A written personalized care plan for  preventive services as well as general preventive health recommendations is available and can be mailed to the patient at her request.      Maud Deed [facility]  1/61/0960

## 2020-05-28 NOTE — Patient Instructions (Signed)
  [facility] Maintenance Summary and Written Plan of Care  Ms. Catherine Cooper ,  Thank you for allowing me to perform your Medicare Annual Wellness Visit and for your ongoing commitment to your health.   Health Maintenance & Immunization History Health Maintenance  Topic Date Due  . DEXA SCAN  12/29/2019  . FOOT EXAM  05/01/2020  . INFLUENZA VACCINE  05/13/2020  . MAMMOGRAM  06/28/2020  . HEMOGLOBIN A1C  08/23/2020  . TETANUS/TDAP  02/10/2021  . URINE MICROALBUMIN  02/20/2021  . OPHTHALMOLOGY EXAM  03/19/2021  . COVID-19 Vaccine  Completed  . PNA vac Low Risk Adult  Completed   Immunization History  Administered Date(s) Administered  . Fluad Quad(high Dose 65+) 08/11/2019  . Influenza Whole 08/12/2010  . Influenza, High Dose Seasonal PF 08/21/2016, 07/14/2017, 07/12/2018  . Influenza,inj,Quad PF,6+ Mos 07/04/2013, 07/20/2014, 07/18/2015  . Moderna SARS-COVID-2 Vaccination 12/12/2019, 01/09/2020  . Pneumococcal Conjugate-13 01/31/2015  . Pneumococcal Polysaccharide-23 08/12/2010  . Zoster 04/12/2014    These are the patient goals that we discussed: Goals Addressed            This Visit's Progress   . Exercise 3x per week (30 min per time)   Not on track    Try to exercise for at least 30 minutes three times weekly.  Seated exercise     . Have 3 meals a day   On track    Eat 3 meals daily that consist of lean proteins, fruits, and vegetables.     . Patient Stated       She would like to complete the removal of the SCC on her nose. Patient is awaiting a telephone call from the surgical center with an appointment date/time.        This is a list of Health Maintenance Items that are overdue or due now: Health Maintenance Due  Topic Date Due  . DEXA SCAN  12/29/2019  . FOOT EXAM  05/01/2020  . INFLUENZA VACCINE  05/13/2020     Orders/Referrals Placed Today: No orders of the defined types were placed in this encounter.  (Contact our  referral department at [phone] if you have not spoken with someone about your referral appointment within the next 5 days)    Follow-up Plan  Scheduled with Mary-Margaret 06/11/2020 at 8:15 am

## 2020-05-31 DIAGNOSIS — E1142 Type 2 diabetes mellitus with diabetic polyneuropathy: Secondary | ICD-10-CM | POA: Diagnosis not present

## 2020-05-31 DIAGNOSIS — B351 Tinea unguium: Secondary | ICD-10-CM | POA: Diagnosis not present

## 2020-05-31 DIAGNOSIS — M79676 Pain in unspecified toe(s): Secondary | ICD-10-CM | POA: Diagnosis not present

## 2020-06-11 ENCOUNTER — Ambulatory Visit (INDEPENDENT_AMBULATORY_CARE_PROVIDER_SITE_OTHER): Payer: Medicare Other | Admitting: Nurse Practitioner

## 2020-06-11 ENCOUNTER — Other Ambulatory Visit: Payer: Self-pay

## 2020-06-11 ENCOUNTER — Encounter: Payer: Self-pay | Admitting: Nurse Practitioner

## 2020-06-11 VITALS — BP 139/80 | HR 89 | Temp 97.6°F | Resp 20 | Ht 65.0 in | Wt 160.0 lb

## 2020-06-11 DIAGNOSIS — N1831 Chronic kidney disease, stage 3a: Secondary | ICD-10-CM

## 2020-06-11 DIAGNOSIS — I10 Essential (primary) hypertension: Secondary | ICD-10-CM | POA: Diagnosis not present

## 2020-06-11 DIAGNOSIS — E1142 Type 2 diabetes mellitus with diabetic polyneuropathy: Secondary | ICD-10-CM | POA: Diagnosis not present

## 2020-06-11 DIAGNOSIS — K219 Gastro-esophageal reflux disease without esophagitis: Secondary | ICD-10-CM

## 2020-06-11 DIAGNOSIS — K591 Functional diarrhea: Secondary | ICD-10-CM

## 2020-06-11 DIAGNOSIS — Z6828 Body mass index (BMI) 28.0-28.9, adult: Secondary | ICD-10-CM

## 2020-06-11 DIAGNOSIS — F411 Generalized anxiety disorder: Secondary | ICD-10-CM

## 2020-06-11 DIAGNOSIS — M810 Age-related osteoporosis without current pathological fracture: Secondary | ICD-10-CM

## 2020-06-11 DIAGNOSIS — E782 Mixed hyperlipidemia: Secondary | ICD-10-CM | POA: Diagnosis not present

## 2020-06-11 LAB — CMP14+EGFR
ALT: 16 IU/L (ref 0–32)
AST: 16 IU/L (ref 0–40)
Albumin/Globulin Ratio: 1.6 (ref 1.2–2.2)
Albumin: 4 g/dL (ref 3.7–4.7)
Alkaline Phosphatase: 85 IU/L (ref 48–121)
BUN/Creatinine Ratio: 19 (ref 12–28)
BUN: 18 mg/dL (ref 8–27)
Bilirubin Total: 0.4 mg/dL (ref 0.0–1.2)
CO2: 26 mmol/L (ref 20–29)
Calcium: 8.9 mg/dL (ref 8.7–10.3)
Chloride: 103 mmol/L (ref 96–106)
Creatinine, Ser: 0.95 mg/dL (ref 0.57–1.00)
GFR calc Af Amer: 65 mL/min/{1.73_m2} (ref >59)
GFR calc non Af Amer: 57 mL/min/{1.73_m2} — ABNORMAL LOW (ref >59)
Globulin, Total: 2.5 g/dL (ref 1.5–4.5)
Glucose: 153 mg/dL — ABNORMAL HIGH (ref 65–99)
Potassium: 4.1 mmol/L (ref 3.5–5.2)
Sodium: 143 mmol/L (ref 134–144)
Total Protein: 6.5 g/dL (ref 6.0–8.5)

## 2020-06-11 LAB — LIPID PANEL
Chol/HDL Ratio: 3.8 ratio (ref 0.0–4.4)
Cholesterol, Total: 161 mg/dL (ref 100–199)
HDL: 42 mg/dL (ref >39)
LDL Chol Calc (NIH): 92 mg/dL (ref 0–99)
Triglycerides: 152 mg/dL — ABNORMAL HIGH (ref 0–149)
VLDL Cholesterol Cal: 27 mg/dL (ref 5–40)

## 2020-06-11 LAB — CBC WITH DIFFERENTIAL/PLATELET
Basophils Absolute: 0.1 10*3/uL (ref 0.0–0.2)
Basos: 1 %
EOS (ABSOLUTE): 0.4 10*3/uL (ref 0.0–0.4)
Eos: 7 %
Hematocrit: 40.7 % (ref 34.0–46.6)
Hemoglobin: 13.5 g/dL (ref 11.1–15.9)
Immature Grans (Abs): 0 10*3/uL (ref 0.0–0.1)
Immature Granulocytes: 0 %
Lymphocytes Absolute: 1.7 10*3/uL (ref 0.7–3.1)
Lymphs: 32 %
MCH: 31.6 pg (ref 26.6–33.0)
MCHC: 33.2 g/dL (ref 31.5–35.7)
MCV: 95 fL (ref 79–97)
Monocytes Absolute: 0.4 10*3/uL (ref 0.1–0.9)
Monocytes: 8 %
Neutrophils Absolute: 2.7 10*3/uL (ref 1.4–7.0)
Neutrophils: 52 %
Platelets: 157 10*3/uL (ref 150–450)
RBC: 4.27 x10E6/uL (ref 3.77–5.28)
RDW: 12.2 % (ref 11.7–15.4)
WBC: 5.3 10*3/uL (ref 3.4–10.8)

## 2020-06-11 LAB — BAYER DCA HB A1C WAIVED: HB A1C (BAYER DCA - WAIVED): 7.9 % — ABNORMAL HIGH (ref <7.0)

## 2020-06-11 MED ORDER — FARXIGA 10 MG PO TABS: 10.0000 mg | ORAL_TABLET | Freq: Every morning | ORAL | 1 refills | Status: DC

## 2020-06-11 MED ORDER — GLIPIZIDE ER 10 MG PO TB24: 10.0000 mg | ORAL_TABLET | Freq: Every day | ORAL | 1 refills | Status: DC

## 2020-06-11 MED ORDER — DEXILANT 60 MG PO CPDR: 1.0000 | DELAYED_RELEASE_CAPSULE | Freq: Every day | ORAL | 1 refills | Status: DC

## 2020-06-11 MED ORDER — LORAZEPAM 0.5 MG PO TABS: ORAL_TABLET | ORAL | 1 refills | Status: DC

## 2020-06-11 MED ORDER — GABAPENTIN 100 MG PO CAPS: 100.0000 mg | ORAL_CAPSULE | Freq: Every day | ORAL | 1 refills | Status: DC

## 2020-06-11 MED ORDER — TRULICITY 0.75 MG/0.5ML ~~LOC~~ SOAJ: SUBCUTANEOUS | 5 refills | Status: DC

## 2020-06-11 MED ORDER — ONETOUCH ULTRA VI STRP: ORAL_STRIP | 1 refills | Status: DC

## 2020-06-11 NOTE — Patient Instructions (Signed)

## 2020-06-11 NOTE — Progress Notes (Signed)
Subjective:    Patient ID: Catherine Cooper, female    DOB: [birth year], 79 y.o.   MRN: [id number]   Chief Complaint: medical management of chronic issues      HPI:  1. Essential hypertension No c/o chest pain, sob or headache. She does check her blood pressure at h ome and systolic is always below 962. BP Readings from Last 3 Encounters:  02/21/20 131/70  11/14/19 136/69  08/11/19 118/69      2. Type 2 diabetes mellitus with diabetic polyneuropathy, without long-term current use of insulin (HCC) checks blood sugars fasting every morning. Running around 130-190's. Has beenover 200 1 x in the last 3 months. Lab Results  Component Value Date   HGBA1C 7.9 (H) 02/21/2020      3. Mixed hyperlipidemia Tries to wtahc diet. Does little to no exercise Lab Results  Component Value Date   CHOL 178 02/21/2020   HDL 45 02/21/2020   LDLCALC 99 02/21/2020   TRIG 198 (H) 02/21/2020   CHOLHDL 4.0 02/21/2020     4. Gastroesophageal reflux disease without esophagitis She is back on dexilant ans is doing better.  5. Stage 3a chronic kidney disease No problems voiding. Lab Results  Component Value Date   CREATININE 0.97 02/21/2020   BUN 13 02/21/2020   NA 141 02/21/2020   K 4.1 02/21/2020   CL 103 02/21/2020   CO2 25 02/21/2020     6. Age-related osteoporosis without current pathological fracture Last dexascan was in march of 2019. t score was -2.7. she does no weight bearing exercises. She is currently on prolia injections. Wants to wait until our dexsacn machine is repaired to repeat dexascan.  7. Functional diarrhea Comes and goes. She is able to tolerate it without medication right now.  8. GAD (generalized anxiety disorder) Has ativan to take on an as needed basis. GAD 7 : Generalized Anxiety Score 06/11/2020 02/21/2020 11/14/2019 08/11/2019  Nervous, Anxious, on Edge 0 - 1 2  Control/stop worrying 0 0 0 1  Worry too much - different things 0 0 0 1  Trouble relaxing  0 0 0 1  Restless 0 0 0 1  Easily annoyed or irritable 0 0 0 0  Afraid - awful might happen 0 0 0 0  Total GAD 7 Score 0 - 1 6  Anxiety Difficulty Not difficult at all Not difficult at all Not difficult at all Somewhat difficult      9. BMI 28.0-28.9,adult No recent weight changes  Wt Readings from Last 3 Encounters:  06/11/20 160 lb (72.6 kg)  02/21/20 164 lb (74.4 kg)  02/06/20 165 lb (74.8 kg)   BMI Readings from Last 3 Encounters:  06/11/20 26.63 kg/m  02/21/20 27.29 kg/m  02/06/20 27.46 kg/m      Outpatient Encounter Medications as of 06/11/2020  Medication Sig  . ALPHAGAN P 0.1 % SOLN Place 1 drop into both eyes 2 (two) times daily.   Marland Kitchen aspirin 81 MG tablet Take 81 mg by mouth every evening.   . bifidobacterium infantis (ALIGN) capsule Take 1 capsule by mouth daily.  . calcium citrate-vitamin D (CITRACAL+D) 315-200 MG-UNIT tablet Take 1 tablet by mouth 2 (two) times daily.  . Cholecalciferol (VITAMIN D3) 2000 UNITS TABS Take 1 capsule by mouth daily.   Marland Kitchen DEXILANT 60 MG capsule Take 1 capsule by mouth daily.  . Dulaglutide (TRULICITY) 9.52 WU/1.3KG SOPN Inject 0.75 mg into the skin once a week.  Marland Kitchen FARXIGA 10 MG TABS tablet  Take 10 mg by mouth every morning.  . gabapentin (NEURONTIN) 100 MG capsule Take 1 capsule (100 mg total) by mouth daily.  Marland Kitchen glipiZIDE (GLUCOTROL XL) 10 MG 24 hr tablet Take 1 tablet (10 mg total) by mouth daily with breakfast.  . ibuprofen (ADVIL,MOTRIN) 800 MG tablet Take 1 tablet (800 mg total) by mouth every 6 (six) hours as needed for pain.  Marland Kitchen latanoprost (XALATAN) 0.005 % ophthalmic solution Place 1 drop into both eyes at bedtime.   Marland Kitchen LORazepam (ATIVAN) 0.5 MG tablet TAKE 1 TABLET TWICE DAILY AS NEEDED FOR ANXIETY OR SLEEP  . Multiple Vitamins-Minerals (PRESERVISION/LUTEIN PO) Take by mouth.  Glory Rosebush ULTRA test strip TEST BLOOD SUGAR ONCE DAILYAND AS NEEDED  . PROLIA 60 MG/ML SOSY injection INJECT 1 SYRINGE INTO THE SKIN ONCE FOR 1 DOSE.    . SSD 1 % cream   . vitamin B-12 (CYANOCOBALAMIN) 1000 MCG tablet Take 1,000 mcg by mouth daily.       Past Surgical History:  Procedure Laterality Date  .  vitrectomy  02/23/08   posterior; right eye  . ABDOMINAL HYSTERECTOMY    . ACHILLES TENDON SURGERY Left 03/28/2016   Procedure: Reconstruction Left Achilles;  Surgeon: Newt Minion, MD;  Location: [facility];  Service: Orthopedics;  Laterality: Left;  . AMPUTATION Right 02/14/2015   Procedure: 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;  Surgeon: Newt Minion, MD;  Location: [facility];  Service: Orthopedics;  Laterality: Right;  . COLONOSCOPY  2013  . EYE SURGERY    . FOOT SURGERY     Right foot / left heel  . glaucome surgery right eye    . SKIN GRAFT Right 2007   foot  . TUBAL LIGATION      Family History  Problem Relation Age of Onset  . Dementia Father   . Diabetes Father   . Coronary artery disease Father 59  . Hip fracture Father   . Alzheimer's disease Father   . Atrial fibrillation Mother   . Osteoporosis Mother     New complaints: None today  Social history: Lives with her husband  Controlled substance contract: 08/16/19    Review of Systems  Constitutional: Negative for diaphoresis.  Eyes: Negative for pain.  Respiratory: Negative for shortness of breath.   Cardiovascular: Negative for chest pain, palpitations and leg swelling.  Gastrointestinal: Negative for abdominal pain.  Endocrine: Negative for polydipsia.  Genitourinary: Positive for difficulty urinating.  Skin: Negative for rash.  Neurological: Negative for dizziness, weakness and headaches.  Hematological: Does not bruise/bleed easily.  All other systems reviewed and are negative.      Objective:   Physical Exam Vitals and nursing note reviewed.  Constitutional:      General: She is not in acute distress.    Appearance: Normal appearance. She is well-developed.  HENT:     Head: Normocephalic.     Nose: Nose normal.  Eyes:     Pupils: Pupils are  equal, round, and reactive to light.  Neck:     Vascular: No carotid bruit or JVD.  Cardiovascular:     Rate and Rhythm: Normal rate and regular rhythm.     Heart sounds: Normal heart sounds.  Pulmonary:     Effort: Pulmonary effort is normal. No respiratory distress.     Breath sounds: Normal breath sounds. No wheezing or rales.  Chest:     Chest wall: No tenderness.  Abdominal:     General: Bowel sounds are normal. There  is no distension or abdominal bruit.     Palpations: Abdomen is soft. There is no hepatomegaly, splenomegaly, mass or pulsatile mass.     Tenderness: There is no abdominal tenderness.  Musculoskeletal:        General: Normal range of motion.     Cervical back: Normal range of motion and neck supple.  Lymphadenopathy:     Cervical: No cervical adenopathy.  Skin:    General: Skin is warm and dry.  Neurological:     Mental Status: She is alert and oriented to person, place, and time.     Deep Tendon Reflexes: Reflexes are normal and symmetric.  Psychiatric:        Behavior: Behavior normal.        Thought Content: Thought content normal.        Judgment: Judgment normal.     BP 139/80   Pulse 89   Temp 97.6 F (36.4 C) (Temporal)   Resp 20   Ht 5' 5"  (1.651 m)   Wt 160 lb (72.6 kg)   SpO2 98%   BMI 26.63 kg/m   hgba1c 7.9%      Assessment & Plan:  DACIA CAPERS comes in today with chief complaint of Medical Management of Chronic Issues   Diagnosis and orders addressed:  1. Essential hypertension Low sodium diet - CBC with Differential/Platelet - CMP14+EGFR  2. Type 2 diabetes mellitus with diabetic polyneuropathy, without long-term current use of insulin (HCC) Continue low carb diet - Bayer DCA Hb A1c Waived - gabapentin (NEURONTIN) 100 MG capsule; Take 1 capsule (100 mg total) by mouth daily.  Dispense: 90 capsule; Refill: 1 - glipiZIDE (GLUCOTROL XL) 10 MG 24 hr tablet; Take 1 tablet (10 mg total) by mouth daily with breakfast.   Dispense: 90 tablet; Refill: 1 - FARXIGA 10 MG TABS tablet; Take 1 tablet (10 mg total) by mouth every morning.  Dispense: 90 tablet; Refill: 1 - Dulaglutide (TRULICITY) 8.34 PB/3.5DI SOPN; Inject 0.75 mg into the skin once a week.  Dispense: 2 mL; Refill: 5  3. Mixed hyperlipidemia Low fat diet - Lipid panel  4. Gastroesophageal reflux disease without esophagitis Avoid spicy foods Do not eat 2 hours prior to bedtime  5. Stage 3a chronic kidney disease Labs pending  6. Age-related osteoporosis without current pathological fracture Weight bearing exercises  7. Functional diarrhea Imodium ad OTC  8. GAD (generalized anxiety disorder) Stress management - LORazepam (ATIVAN) 0.5 MG tablet; TAKE 1 TABLET TWICE DAILY AS NEEDED FOR ANXIETY OR SLEEP  Dispense: 60 tablet; Refill: 1  9. BMI 28.0-28.9,adult Discussed diet and exercise for person with BMI >25 Will recheck weight in 3-6 months    Labs pending Health Maintenance reviewed Diet and exercise encouraged  Follow up plan:  3 months   Mary-Margaret Hassell Done, FNP

## 2020-06-12 ENCOUNTER — Other Ambulatory Visit: Payer: Self-pay | Admitting: Nurse Practitioner

## 2020-06-12 DIAGNOSIS — Z1231 Encounter for screening mammogram for malignant neoplasm of breast: Secondary | ICD-10-CM

## 2020-06-18 ENCOUNTER — Encounter: Payer: Self-pay | Admitting: Dermatology

## 2020-06-18 NOTE — Progress Notes (Signed)
   Follow-Up Visit   Subjective  Catherine Cooper is a 79 y.o. female who presents for the following: Procedure.  CIS Location: Upper central nose Duration:  Quality:  Associated Signs/Symptoms: Modifying Factors:  Severity:  Timing: Context: For treatment.  Also needs repeat biopsy) nasal  Objective  Well appearing patient in no apparent distress; mood and affect are within normal limits.  A focused examination was performed including Head and neck.. Relevant physical exam findings are noted in the Assessment and Plan.   Assessment & Plan    Neoplasm of uncertain behavior of skin Right upper nose  Skin / nail biopsy Type of biopsy: tangential   Informed consent: discussed and consent obtained   Timeout: patient name, date of birth, surgical site, and procedure verified   Procedure prep:  Patient was prepped and draped in usual sterile fashion Prep type:  Isopropyl alcohol Anesthesia: the lesion was anesthetized in a standard fashion   Anesthetic:  1% lidocaine w/ epinephrine 1-100,000 buffered w/ 8.4% NaHCO3 Instrument used: flexible razor blade   Hemostasis achieved with: pressure, aluminum chloride, ferric subsulfate and electrodesiccation   Outcome: patient tolerated procedure well   Post-procedure details: sterile dressing applied and wound care instructions given   Dressing type: bandage and petrolatum    Specimen 1 - Surgical pathology Differential Diagnosis: BCC vs SCC Check Margins: No  Will recommend Mohs surgery if biopsy confirms residual BCC.  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  Destruction of lesion Complexity: simple   Destruction method: electrodesiccation and curettage   Informed consent: discussed and consent obtained   Timeout:  patient name, date of birth, surgical site, and procedure verified Anesthesia: the lesion was anesthetized in a standard fashion   Anesthetic:  1% lidocaine w/ epinephrine 1-100,000 local  infiltration Curettage performed in three different directions: Yes   Curettage cycles:  3 Lesion length (cm):  0.9 Lesion width (cm):  0.9 Margin per side (cm):  0 Final wound size (cm):  0.9 Hemostasis achieved with:  ferric subsulfate Outcome: patient tolerated procedure well with no complications   Post-procedure details: wound care instructions given   Additional details:  Inoculated with parenteral 5% fluorouracil  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  This lesion proved to be quite superficial so you can expect an abrasion on this area for 2 weeks with no stitches.  It is okay to do full activities beyond avoiding a sunburn.  After washing please apply a little Vaseline or, if you are not allergic, triple antibiotic ointment daily for the next 1 to 2 weeks.  If you have any problems my cell phone number is [phone].  Additionally the laboratory was unable to give [place] a report on the biopsy on the right side of the nose so this was resampled.  I have asked Catherine Cooper to please call my office next week to let me know the surgical site is doing fine and so that we may share the biopsy result from the right side of the nose.  She knows she can contact me sooner if there is any issue.   I, Catherine Monarch, MD, have reviewed all documentation for this visit.  The documentation on 06/18/20 for the exam, diagnosis, procedures, and orders are all accurate and complete.

## 2020-07-02 ENCOUNTER — Telehealth: Payer: Self-pay | Admitting: Dermatology

## 2020-07-02 ENCOUNTER — Ambulatory Visit (INDEPENDENT_AMBULATORY_CARE_PROVIDER_SITE_OTHER): Payer: Medicare Other | Admitting: Family Medicine

## 2020-07-02 ENCOUNTER — Other Ambulatory Visit: Payer: Self-pay

## 2020-07-02 DIAGNOSIS — Z23 Encounter for immunization: Secondary | ICD-10-CM | POA: Diagnosis not present

## 2020-07-02 NOTE — Telephone Encounter (Signed)
Patient is calling to say that she received a phone call from our office with pathology results from her visit with Lavonna Monarch, MD in early August.  Patient says that she was told we would be contacting her with a follow up appointment at a different facility.  Patient says that she has received no further information from [place] and wants to know when she can expect information on this visit.

## 2020-07-02 NOTE — Telephone Encounter (Signed)
Called [facility] surgery center to check status of MOHS appointment and they said they were calling patient today.

## 2020-08-07 ENCOUNTER — Telehealth: Payer: Self-pay | Admitting: *Deleted

## 2020-08-07 NOTE — Telephone Encounter (Signed)
Vm from [facility] w/ Hospice Pt came back from Hospice home She stopped several of her meds because she was not eating and very nauseated Still on her ASA, Plavix, lipitor and starting back on potassium

## 2020-08-09 DIAGNOSIS — B351 Tinea unguium: Secondary | ICD-10-CM | POA: Diagnosis not present

## 2020-08-09 DIAGNOSIS — E1151 Type 2 diabetes mellitus with diabetic peripheral angiopathy without gangrene: Secondary | ICD-10-CM | POA: Diagnosis not present

## 2020-08-13 ENCOUNTER — Encounter (INDEPENDENT_AMBULATORY_CARE_PROVIDER_SITE_OTHER): Payer: Medicare Other | Admitting: Ophthalmology

## 2020-08-15 DIAGNOSIS — H353122 Nonexudative age-related macular degeneration, left eye, intermediate dry stage: Secondary | ICD-10-CM | POA: Insufficient documentation

## 2020-08-15 DIAGNOSIS — B0230 Zoster ocular disease, unspecified: Secondary | ICD-10-CM | POA: Insufficient documentation

## 2020-08-15 DIAGNOSIS — E113551 Type 2 diabetes mellitus with stable proliferative diabetic retinopathy, right eye: Secondary | ICD-10-CM | POA: Insufficient documentation

## 2020-08-15 DIAGNOSIS — H35363 Drusen (degenerative) of macula, bilateral: Secondary | ICD-10-CM | POA: Insufficient documentation

## 2020-08-15 DIAGNOSIS — E113492 Type 2 diabetes mellitus with severe nonproliferative diabetic retinopathy without macular edema, left eye: Secondary | ICD-10-CM | POA: Insufficient documentation

## 2020-08-16 ENCOUNTER — Other Ambulatory Visit: Payer: Self-pay

## 2020-08-16 ENCOUNTER — Encounter (INDEPENDENT_AMBULATORY_CARE_PROVIDER_SITE_OTHER): Payer: Self-pay | Admitting: Ophthalmology

## 2020-08-16 ENCOUNTER — Ambulatory Visit (INDEPENDENT_AMBULATORY_CARE_PROVIDER_SITE_OTHER): Payer: Medicare Other | Admitting: Ophthalmology

## 2020-08-16 DIAGNOSIS — Z794 Long term (current) use of insulin: Secondary | ICD-10-CM

## 2020-08-16 DIAGNOSIS — E113492 Type 2 diabetes mellitus with severe nonproliferative diabetic retinopathy without macular edema, left eye: Secondary | ICD-10-CM | POA: Diagnosis not present

## 2020-08-16 DIAGNOSIS — E113551 Type 2 diabetes mellitus with stable proliferative diabetic retinopathy, right eye: Secondary | ICD-10-CM

## 2020-08-16 DIAGNOSIS — H35363 Drusen (degenerative) of macula, bilateral: Secondary | ICD-10-CM | POA: Diagnosis not present

## 2020-08-16 DIAGNOSIS — H353122 Nonexudative age-related macular degeneration, left eye, intermediate dry stage: Secondary | ICD-10-CM | POA: Diagnosis not present

## 2020-08-16 DIAGNOSIS — B0230 Zoster ocular disease, unspecified: Secondary | ICD-10-CM

## 2020-08-16 NOTE — Progress Notes (Signed)
08/16/2020     CHIEF COMPLAINT Patient presents for Retina Follow Up   HISTORY OF PRESENT ILLNESS: Catherine Cooper is a 79 y.o. female who presents to the clinic today for:   HPI    Retina Follow Up    Patient presents with  Diabetic Retinopathy.  In both eyes.  This started 9 months ago.  Severity is mild.  Duration of 9 months.  Since onset it is stable.          Comments    9 Month Diabetic F/U OU  Pt denies noticeable changes to [place] OU since last visit. Pt denies ocular pain, flashes of light, or floaters OU.  A1c: 7.0, 06/11/2020 LBS: 147 this AM       Last edited by Rockie Neighbours, [place] on 08/16/2020  9:21 AM. (History)      Referring physician: Chevis Pretty, [address] [place],  [place] [address]  HISTORICAL INFORMATION:   Selected notes from the MEDICAL RECORD NUMBER    Lab Results  Component Value Date   HGBA1C 7.9 (H) 06/11/2020     CURRENT MEDICATIONS: Current Outpatient Medications (Ophthalmic Drugs)  Medication Sig  . ALPHAGAN P 0.1 % SOLN Place 1 drop into both eyes 2 (two) times daily.   Marland Kitchen latanoprost (XALATAN) 0.005 % ophthalmic solution Place 1 drop into both eyes at bedtime.    No current facility-administered medications for this visit. (Ophthalmic Drugs)   Current Outpatient Medications (Other)  Medication Sig  . aspirin 81 MG tablet Take 81 mg by mouth every evening.   . bifidobacterium infantis (ALIGN) capsule Take 1 capsule by mouth daily.  . calcium citrate-vitamin D (CITRACAL+D) 315-200 MG-UNIT tablet Take 1 tablet by mouth 2 (two) times daily.  . Cholecalciferol (VITAMIN D3) 2000 UNITS TABS Take 1 capsule by mouth daily.   Marland Kitchen DEXILANT 60 MG capsule Take 1 capsule (60 mg total) by mouth daily.  . Dulaglutide (TRULICITY) 9.62 XB/2.8UX SOPN Inject 0.75 mg into the skin once a week.  Marland Kitchen FARXIGA 10 MG TABS tablet Take 1 tablet (10 mg total) by mouth every morning.  . gabapentin (NEURONTIN) 100 MG capsule Take 1 capsule  (100 mg total) by mouth daily.  Marland Kitchen glipiZIDE (GLUCOTROL XL) 10 MG 24 hr tablet Take 1 tablet (10 mg total) by mouth daily with breakfast.  . ibuprofen (ADVIL,MOTRIN) 800 MG tablet Take 1 tablet (800 mg total) by mouth every 6 (six) hours as needed for pain.  Marland Kitchen LORazepam (ATIVAN) 0.5 MG tablet TAKE 1 TABLET TWICE DAILY AS NEEDED FOR ANXIETY OR SLEEP  . Multiple Vitamins-Minerals (PRESERVISION/LUTEIN PO) Take by mouth.  Glory Rosebush ULTRA test strip TEST BLOOD SUGAR ONCE DAILYAND AS NEEDED  . PROLIA 60 MG/ML SOSY injection INJECT 1 SYRINGE INTO THE SKIN ONCE FOR 1 DOSE.  . SSD 1 % cream   . vitamin B-12 (CYANOCOBALAMIN) 1000 MCG tablet Take 1,000 mcg by mouth daily.     No current facility-administered medications for this visit. (Other)      REVIEW OF SYSTEMS:    ALLERGIES Allergies  Allergen Reactions  . Jardiance [Empagliflozin] Other (See Comments)    Legs eak  . Lipitor [Atorvastatin]     fatique and myalgia  . Penicillins Rash    PAST MEDICAL HISTORY Past Medical History:  Diagnosis Date  . Achilles rupture, left   . Anxiety   . Cataracts, bilateral   . 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 Diabetes mellitus    type II  . Diabetic neuropathy ([place])   . Diarrhea    attributed to diabetic meds  . Endometrioid adenocarcinoma   . GAD (generalized anxiety disorder)   . GERD (gastroesophageal reflux disease)   . Glaucoma   . Hyperlipidemia    not on medications  . Iron deficiency   . Nephrolithiasis 2000  . Neuropathy    both feet  . Osteoporosis   . Shingles 2004  . 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   Right Upper Nose tx cx3 72fu   Past Surgical History:  Procedure Laterality Date  .  vitrectomy  02/23/08   posterior; right eye  . ABDOMINAL HYSTERECTOMY    . ACHILLES TENDON SURGERY Left 03/28/2016   Procedure: Reconstruction  Left Achilles;  Surgeon: Newt Minion, MD;  Location: [facility];  Service: Orthopedics;  Laterality: Left;  . 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 COLONOSCOPY  2013  . EYE SURGERY    . FOOT SURGERY     Right foot / left heel  . glaucome surgery right eye    . SKIN GRAFT Right 2007   foot  . TUBAL LIGATION      FAMILY HISTORY Family History  Problem Relation Age of Onset  . Dementia Father   . Diabetes Father   . Coronary artery disease Father 63  . Hip fracture Father   . Alzheimer's disease Father   . Atrial fibrillation Mother   . Osteoporosis Mother     SOCIAL HISTORY Social History   Tobacco Use  . Smoking status: Never Smoker  . Smokeless tobacco: Never Used  Vaping Use  . Vaping Use: Never used  Substance Use Topics  . Alcohol use: No  . Drug use: No         OPHTHALMIC EXAM:  Base Eye Exam    Visual Acuity (ETDRS)      Right Left   Dist cc 20/50 +2 20/25 -2   Dist ph cc NI    Correction: Glasses       Tonometry (Tonopen, 9:21 AM)      Right Left   Pressure 20 17       Pupils      Pupils Dark Light Shape React APD   Right PERRL 2 2 Round Minimal None   Left PERRL 2 2 Irregular Minimal None       Visual Fields (Counting fingers)      Left Right    Full Full       Extraocular Movement      Right Left    Full Full       Neuro/Psych    Oriented x3: Yes   Mood/Affect: Normal       Dilation    Both eyes: 1.0% Mydriacyl, 2.5% Phenylephrine @ 9:25 AM        Slit Lamp and Fundus Exam    External Exam      Right Left   External Normal Normal       Slit Lamp Exam      Right Left   Lids/Lashes Normal Normal   Conjunctiva/Sclera White and quiet White and quiet   Cornea Clear Clear   Anterior Chamber Deep and quiet Deep and  quiet   Iris Round and reactive Round and reactive   Lens Posterior chamber intraocular  lens Posterior chamber intraocular lens   Anterior Vitreous Normal Normal       Fundus Exam      Right Left   Posterior Vitreous Normal Normal   Disc Normal Normal   C/D Ratio 0.7 0.55   Macula no clinically significant macular edema, no hemorrhage, Microaneurysms no clinically significant macular edema, no hemorrhage, Microaneurysms   Vessels PDR-quiet NPDR-Severe   Periphery Good PRP mostly nasal superior and inferior Large stable chorioretinal scar nasally, likely prevents progression of retinopathy          IMAGING AND PROCEDURES  Imaging and Procedures for 08/16/20  OCT, Retina - OU - Both Eyes       Right Eye Quality was good. Scan locations included subfoveal. Central Foveal Thickness: 199. Progression has been stable. Findings include abnormal foveal contour, central retinal atrophy, outer retinal atrophy, inner retinal atrophy.   Left Eye Quality was good. Scan locations included subfoveal. Central Foveal Thickness: 251. Progression has been stable.   Notes No active maculopathy OU.       Color Fundus Photography Optos - OU - Both Eyes       Right Eye Progression has been stable.   Left Eye Progression has been stable.   Notes Good nasal peripheral PRP, PDR quiescent.  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 Intermediate stage nonexudative age-related macular degeneration of left eye  H35.3122 OCT, Retina - OU - Both Eyes    Color Fundus Photography Optos - OU - Both Eyes  3.  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 Degenerative retinal drusen of both eyes  H35.363     1.  No active disease OU, will observe  2.  3.  Ophthalmic Meds Ordered this visit:  No orders of the defined types were placed in this encounter.      Return in about 9 months (around 05/16/2021) for DILATE OU, COLOR FP.  There are no Patient Instructions on file for this visit.   Explained the diagnoses, plan, and follow up with the patient and they expressed understanding.  Patient expressed understanding of the importance of proper follow up care.   Clent Demark Abie Cheek M.D. Diseases & Surgery of the Retina and Vitreous [facility] [place] 08/16/20     Abbreviations: M myopia (nearsighted); A astigmatism; H hyperopia (farsighted); P presbyopia; Mrx spectacle prescription;  CTL contact lenses; OD right eye; OS left eye; OU both eyes  XT exotropia; ET esotropia; PEK punctate epithelial keratitis; PEE punctate epithelial erosions; DES dry eye syndrome; MGD meibomian gland dysfunction; ATs artificial tears; PFAT's preservative free artificial tears; [place] nuclear sclerotic cataract; PSC posterior subcapsular cataract; ERM epi-retinal membrane;  PVD posterior vitreous detachment; RD retinal detachment; DM diabetes mellitus; DR diabetic retinopathy; NPDR non-proliferative diabetic retinopathy; PDR proliferative diabetic retinopathy; CSME clinically significant macular edema; DME diabetic macular edema; dbh dot blot hemorrhages; CWS cotton wool spot; POAG primary open angle glaucoma; C/D cup-to-disc ratio; HVF humphrey visual field; GVF goldmann visual field; OCT optical coherence tomography; IOP intraocular pressure; BRVO Branch retinal vein occlusion; CRVO central retinal vein occlusion; CRAO central retinal artery  occlusion; BRAO branch retinal artery occlusion; RT retinal tear; SB scleral buckle; PPV pars plana vitrectomy; VH Vitreous hemorrhage; PRP panretinal laser photocoagulation; IVK intravitreal kenalog; VMT vitreomacular traction; MH Macular hole;  NVD neovascularization of the disc; NVE neovascularization elsewhere; AREDS age related eye disease study; ARMD age related macular degeneration; POAG primary open angle glaucoma; EBMD epithelial/anterior basement membrane dystrophy; ACIOL anterior chamber intraocular lens; IOL intraocular lens; PCIOL posterior chamber intraocular lens; Phaco/IOL phacoemulsification with intraocular lens placement; [place] photorefractive keratectomy; LASIK laser assisted in situ keratomileusis; HTN hypertension; DM diabetes mellitus; COPD chronic obstructive pulmonary disease

## 2020-08-16 NOTE — Assessment & Plan Note (Signed)
Stable OD °

## 2020-08-20 ENCOUNTER — Other Ambulatory Visit: Payer: Self-pay | Admitting: Nurse Practitioner

## 2020-08-20 DIAGNOSIS — E1142 Type 2 diabetes mellitus with diabetic polyneuropathy: Secondary | ICD-10-CM

## 2020-08-21 ENCOUNTER — Other Ambulatory Visit: Payer: Self-pay

## 2020-08-21 ENCOUNTER — Ambulatory Visit
Admission: RE | Admit: 2020-08-21 | Discharge: 2020-08-21 | Disposition: A | Discharge status: Home / Self Care | Payer: Medicare Other | Source: Ambulatory Visit | Attending: Nurse Practitioner | Admitting: Nurse Practitioner

## 2020-08-21 DIAGNOSIS — Z1231 Encounter for screening mammogram for malignant neoplasm of breast: Secondary | ICD-10-CM

## 2020-08-23 DIAGNOSIS — C44311 Basal cell carcinoma of skin of nose: Secondary | ICD-10-CM | POA: Diagnosis not present

## 2020-09-11 ENCOUNTER — Ambulatory Visit (INDEPENDENT_AMBULATORY_CARE_PROVIDER_SITE_OTHER): Payer: Medicare Other | Admitting: Nurse Practitioner

## 2020-09-11 ENCOUNTER — Ambulatory Visit (INDEPENDENT_AMBULATORY_CARE_PROVIDER_SITE_OTHER): Payer: Medicare Other

## 2020-09-11 ENCOUNTER — Encounter: Payer: Self-pay | Admitting: Nurse Practitioner

## 2020-09-11 ENCOUNTER — Other Ambulatory Visit: Payer: Self-pay

## 2020-09-11 VITALS — BP 119/64 | HR 87 | Temp 97.6°F | Ht 65.0 in | Wt 162.8 lb

## 2020-09-11 DIAGNOSIS — N1831 Chronic kidney disease, stage 3a: Secondary | ICD-10-CM | POA: Diagnosis not present

## 2020-09-11 DIAGNOSIS — K219 Gastro-esophageal reflux disease without esophagitis: Secondary | ICD-10-CM

## 2020-09-11 DIAGNOSIS — I10 Essential (primary) hypertension: Secondary | ICD-10-CM | POA: Diagnosis not present

## 2020-09-11 DIAGNOSIS — M810 Age-related osteoporosis without current pathological fracture: Secondary | ICD-10-CM | POA: Diagnosis not present

## 2020-09-11 DIAGNOSIS — K591 Functional diarrhea: Secondary | ICD-10-CM

## 2020-09-11 DIAGNOSIS — Z6828 Body mass index (BMI) 28.0-28.9, adult: Secondary | ICD-10-CM

## 2020-09-11 DIAGNOSIS — E782 Mixed hyperlipidemia: Secondary | ICD-10-CM | POA: Diagnosis not present

## 2020-09-11 DIAGNOSIS — E1142 Type 2 diabetes mellitus with diabetic polyneuropathy: Secondary | ICD-10-CM | POA: Diagnosis not present

## 2020-09-11 DIAGNOSIS — F411 Generalized anxiety disorder: Secondary | ICD-10-CM

## 2020-09-11 LAB — LIPID PANEL
Chol/HDL Ratio: 3.9 ratio (ref 0.0–4.4)
Cholesterol, Total: 174 mg/dL (ref 100–199)
HDL: 45 mg/dL (ref >39)
LDL Chol Calc (NIH): 100 mg/dL — ABNORMAL HIGH (ref 0–99)
Triglycerides: 169 mg/dL — ABNORMAL HIGH (ref 0–149)
VLDL Cholesterol Cal: 29 mg/dL (ref 5–40)

## 2020-09-11 LAB — CMP14+EGFR
ALT: 16 IU/L (ref 0–32)
AST: 13 IU/L (ref 0–40)
Albumin/Globulin Ratio: 1.5 (ref 1.2–2.2)
Albumin: 4.1 g/dL (ref 3.7–4.7)
Alkaline Phosphatase: 102 IU/L (ref 44–121)
BUN/Creatinine Ratio: 13 (ref 12–28)
BUN: 12 mg/dL (ref 8–27)
Bilirubin Total: 0.5 mg/dL (ref 0.0–1.2)
CO2: 25 mmol/L (ref 20–29)
Calcium: 9 mg/dL (ref 8.7–10.3)
Chloride: 104 mmol/L (ref 96–106)
Creatinine, Ser: 0.91 mg/dL (ref 0.57–1.00)
GFR calc Af Amer: 69 mL/min/{1.73_m2} (ref >59)
GFR calc non Af Amer: 60 mL/min/{1.73_m2} (ref >59)
Globulin, Total: 2.8 g/dL (ref 1.5–4.5)
Glucose: 157 mg/dL — ABNORMAL HIGH (ref 65–99)
Potassium: 4.3 mmol/L (ref 3.5–5.2)
Sodium: 144 mmol/L (ref 134–144)
Total Protein: 6.9 g/dL (ref 6.0–8.5)

## 2020-09-11 LAB — CBC WITH DIFFERENTIAL/PLATELET
Basophils Absolute: 0.1 10*3/uL (ref 0.0–0.2)
Basos: 1 %
EOS (ABSOLUTE): 0.4 10*3/uL (ref 0.0–0.4)
Eos: 4 %
Hematocrit: 40.5 % (ref 34.0–46.6)
Hemoglobin: 13.9 g/dL (ref 11.1–15.9)
Immature Grans (Abs): 0 10*3/uL (ref 0.0–0.1)
Immature Granulocytes: 0 %
Lymphocytes Absolute: 1.9 10*3/uL (ref 0.7–3.1)
Lymphs: 20 %
MCH: 32 pg (ref 26.6–33.0)
MCHC: 34.3 g/dL (ref 31.5–35.7)
MCV: 93 fL (ref 79–97)
Monocytes Absolute: 0.6 10*3/uL (ref 0.1–0.9)
Monocytes: 7 %
Neutrophils Absolute: 6.4 10*3/uL (ref 1.4–7.0)
Neutrophils: 68 %
Platelets: 164 10*3/uL (ref 150–450)
RBC: 4.35 x10E6/uL (ref 3.77–5.28)
RDW: 11.8 % (ref 11.7–15.4)
WBC: 9.4 10*3/uL (ref 3.4–10.8)

## 2020-09-11 LAB — BAYER DCA HB A1C WAIVED: HB A1C (BAYER DCA - WAIVED): 7.9 % — ABNORMAL HIGH (ref <7.0)

## 2020-09-11 MED ORDER — FARXIGA 10 MG PO TABS: 10.0000 mg | ORAL_TABLET | Freq: Every morning | ORAL | 1 refills | Status: DC

## 2020-09-11 MED ORDER — LORAZEPAM 0.5 MG PO TABS: ORAL_TABLET | ORAL | 1 refills | Status: DC

## 2020-09-11 MED ORDER — GABAPENTIN 100 MG PO CAPS: 100.0000 mg | ORAL_CAPSULE | Freq: Every day | ORAL | 1 refills | Status: DC

## 2020-09-11 MED ORDER — DEXILANT 60 MG PO CPDR: 1.0000 | DELAYED_RELEASE_CAPSULE | Freq: Every day | ORAL | 1 refills | Status: DC

## 2020-09-11 MED ORDER — TRULICITY 1.5 MG/0.5ML ~~LOC~~ SOAJ: 1.5000 mg | SUBCUTANEOUS | 1 refills | Status: DC

## 2020-09-11 NOTE — Patient Instructions (Signed)
Diabetes Mellitus and Foot Care Foot care is an important part of your health, especially when you have diabetes. Diabetes may cause you to have problems because of poor blood flow (circulation) to your feet and legs, which can cause your skin to:  Become thinner and drier.  Break more easily.  Heal more slowly.  Peel and crack. You may also have nerve damage (neuropathy) in your legs and feet, causing decreased feeling in them. This means that you may not notice minor injuries to your feet that could lead to more serious problems. Noticing and addressing any potential problems early is the best way to prevent future foot problems. How to care for your feet Foot hygiene  Wash your feet daily with warm water and mild soap. Do not use hot water. Then, pat your feet and the areas between your toes until they are completely dry. Do not soak your feet as this can dry your skin.  Trim your toenails straight across. Do not dig under them or around the cuticle. File the edges of your nails with an emery board or nail file.  Apply a moisturizing lotion or petroleum jelly to the skin on your feet and to dry, brittle toenails. Use lotion that does not contain alcohol and is unscented. Do not apply lotion between your toes. Shoes and socks  Wear clean socks or stockings every day. Make sure they are not too tight. Do not wear knee-high stockings since they may decrease blood flow to your legs.  Wear shoes that fit properly and have enough cushioning. Always look in your shoes before you put them on to be sure there are no objects inside.  To break in new shoes, wear them for just a few hours a day. This prevents injuries on your feet. Wounds, scrapes, corns, and calluses  Check your feet daily for blisters, cuts, bruises, sores, and redness. If you cannot see the bottom of your feet, use a mirror or ask someone for help.  Do not cut corns or calluses or try to remove them with medicine.  If you  find a minor scrape, cut, or break in the skin on your feet, keep it and the skin around it clean and dry. You may clean these areas with mild soap and water. Do not clean the area with peroxide, alcohol, or iodine.  If you have a wound, scrape, corn, or callus on your foot, look at it several times a day to make sure it is healing and not infected. Check for: ? Redness, swelling, or pain. ? Fluid or blood. ? Warmth. ? Pus or a bad smell. General instructions  Do not cross your legs. This may decrease blood flow to your feet.  Do not use heating pads or hot water bottles on your feet. They may burn your skin. If you have lost feeling in your feet or legs, you may not know this is happening until it is too late.  Protect your feet from hot and cold by wearing shoes, such as at the beach or on hot pavement.  Schedule a complete foot exam at least once a year (annually) or more often if you have foot problems. If you have foot problems, report any cuts, sores, or bruises to your health care provider immediately. Contact a health care provider if:  You have a medical condition that increases your risk of infection and you have any cuts, sores, or bruises on your feet.  You have an injury that is not   healing.  You have redness on your legs or feet.  You feel burning or tingling in your legs or feet.  You have pain or cramps in your legs and feet.  Your legs or feet are numb.  Your feet always feel cold.  You have pain around a toenail. Get help right away if:  You have a wound, scrape, corn, or callus on your foot and: ? You have pain, swelling, or redness that gets worse. ? You have fluid or blood coming from the wound, scrape, corn, or callus. ? Your wound, scrape, corn, or callus feels warm to the touch. ? You have pus or a bad smell coming from the wound, scrape, corn, or callus. ? You have a fever. ? You have a red line going up your leg. Summary  Check your feet every day  for cuts, sores, red spots, swelling, and blisters.  Moisturize feet and legs daily.  Wear shoes that fit properly and have enough cushioning.  If you have foot problems, report any cuts, sores, or bruises to your health care provider immediately.  Schedule a complete foot exam at least once a year (annually) or more often if you have foot problems. This information is not intended to replace advice given to you by your health care provider. Make sure you discuss any questions you have with your health care provider. Document Revised: 06/22/2019 Document Reviewed: 10/31/2016 Elsevier Patient Education  2020 Elsevier Inc.  

## 2020-09-11 NOTE — Progress Notes (Signed)
Subjective:    Patient ID: Catherine Cooper, female    DOB: [birth year], 79 y.o.   MRN: [id number]  Chief Complaint: medical management of chronic issues     HPI:  1. Primary hypertension No c/o chest pain, sob or headache. She does not check her blood pressure at home.  BP Readings from Last 3 Encounters:  06/11/20 139/80  02/21/20 131/70  11/14/19 136/69     2. Mixed hyperlipidemia Does try to watch diet but does no dedicated exercise. Lab Results  Component Value Date   CHOL 161 06/11/2020   HDL 42 06/11/2020   LDLCALC 92 06/11/2020   TRIG 152 (H) 06/11/2020   CHOLHDL 3.8 06/11/2020     3. Type 2 diabetes mellitus with diabetic polyneuropathy, without long-term current use of insulin (HCC) Fasting blood sugars are runnning around 120-160. She has occasional readings over 200. She denies any low blood sugars. Lab Results  Component Value Date   HGBA1C 7.9 (H) 06/11/2020     4. Stage 3a chronic kidney disease (HCC) No problems void. deneis any lower ext edema. Lab Results  Component Value Date   CREATININE 0.95 06/11/2020     5. Functional diarrhea Still has and she says she never knows when she ois going to have it.  6. GAD (generalized anxiety disorder) Has ativan only as needed. Takes a couple of times a week. GAD 7 : Generalized Anxiety Score 09/11/2020 06/11/2020 02/21/2020 11/14/2019  Nervous, Anxious, on Edge 1 0 - 1  Control/stop worrying 0 0 0 0  Worry too much - different things 0 0 0 0  Trouble relaxing 0 0 0 0  Restless 0 0 0 0  Easily annoyed or irritable 0 0 0 0  Afraid - awful might happen 0 0 0 0  Total GAD 7 Score 1 0 - 1  Anxiety Difficulty Not difficult at all Not difficult at all Not difficult at all Not difficult at all        7. Age-related osteoporosis without current pathological fracture Last dexascan was done 12/28/17 with t score of -2.7. She is currenlty not on anything.  8. BMI 28.0-28.9,adult No recent weight changes Wt  Readings from Last 3 Encounters:  06/11/20 160 lb (72.6 kg)  02/21/20 164 lb (74.4 kg)  02/06/20 165 lb (74.8 kg)   BMI Readings from Last 3 Encounters:  09/11/20 26.63 kg/m  06/11/20 26.63 kg/m  02/21/20 27.29 kg/m       Outpatient Encounter Medications as of 09/11/2020  Medication Sig  . ALPHAGAN P 0.1 % SOLN Place 1 drop into both eyes 2 (two) times daily.   Marland Kitchen aspirin 81 MG tablet Take 81 mg by mouth every evening.   . bifidobacterium infantis (ALIGN) capsule Take 1 capsule by mouth daily.  . calcium citrate-vitamin D (CITRACAL+D) 315-200 MG-UNIT tablet Take 1 tablet by mouth 2 (two) times daily.  . Cholecalciferol (VITAMIN D3) 2000 UNITS TABS Take 1 capsule by mouth daily.   Marland Kitchen DEXILANT 60 MG capsule Take 1 capsule (60 mg total) by mouth daily.  . Dulaglutide (TRULICITY) 6.29 BM/8.4XL SOPN Inject 0.75 mg into the skin once a week.  Marland Kitchen FARXIGA 10 MG TABS tablet Take 1 tablet (10 mg total) by mouth every morning.  . gabapentin (NEURONTIN) 100 MG capsule Take 1 capsule (100 mg total) by mouth daily.  Marland Kitchen glipiZIDE (GLUCOTROL XL) 10 MG 24 hr tablet TAKE 1 TABLET DAILY WITH   BREAKFAST  . ibuprofen (ADVIL,MOTRIN) 800 MG  tablet Take 1 tablet (800 mg total) by mouth every 6 (six) hours as needed for pain.  Marland Kitchen latanoprost (XALATAN) 0.005 % ophthalmic solution Place 1 drop into both eyes at bedtime.   Marland Kitchen LORazepam (ATIVAN) 0.5 MG tablet TAKE 1 TABLET TWICE DAILY AS NEEDED FOR ANXIETY OR SLEEP  . Multiple Vitamins-Minerals (PRESERVISION/LUTEIN PO) Take by mouth.  Glory Rosebush ULTRA test strip TEST BLOOD SUGAR ONCE DAILYAND AS NEEDED  . PROLIA 60 MG/ML SOSY injection INJECT 1 SYRINGE INTO THE SKIN ONCE FOR 1 DOSE.  . SSD 1 % cream   . vitamin B-12 (CYANOCOBALAMIN) 1000 MCG tablet Take 1,000 mcg by mouth daily.     No facility-administered encounter medications on file as of 09/11/2020.    Past Surgical History:  Procedure Laterality Date  .  vitrectomy  02/23/08   posterior; right eye  .  ABDOMINAL HYSTERECTOMY    . ACHILLES TENDON SURGERY Left 03/28/2016   Procedure: Reconstruction Left Achilles;  Surgeon: Newt Minion, MD;  Location: [place];  Service: Orthopedics;  Laterality: Left;  . 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 COLONOSCOPY  2013  . EYE SURGERY    . FOOT SURGERY     Right foot / left heel  . glaucome surgery right eye    . SKIN GRAFT Right 2007   foot  . TUBAL LIGATION      Family History  Problem Relation Age of Onset  . Dementia Father   . Diabetes Father   . Coronary artery disease Father 21  . Hip fracture Father   . Alzheimer's disease Father   . Atrial fibrillation Mother   . Osteoporosis Mother     New complaints: None today  Social history: Lives with husband  Controlled substance contract: 09/11/20    Review of Systems  Constitutional: Negative for diaphoresis.  Eyes: Negative for pain.  Respiratory: Negative for shortness of breath.   Cardiovascular: Negative for chest pain, palpitations and leg swelling.  Gastrointestinal: Negative for abdominal pain.  Endocrine: Negative for polydipsia.  Skin: Negative for rash.  Neurological: Negative for dizziness, weakness and headaches.  Hematological: Does not bruise/bleed easily.  All other systems reviewed and are negative.      Objective:   Physical Exam Vitals and nursing note reviewed.  Constitutional:      General: She is not in acute distress.    Appearance: Normal appearance. She is well-developed.  HENT:     Head: Normocephalic.     Nose: Nose normal.  Eyes:     Pupils: Pupils are equal, round, and reactive to light.  Neck:     Vascular: No carotid bruit or JVD.  Cardiovascular:     Rate and Rhythm: Normal rate and regular rhythm.     Heart sounds: Normal heart sounds.  Pulmonary:     Effort: Pulmonary effort is normal. No respiratory  distress.     Breath sounds: Normal breath sounds. No wheezing or rales.  Chest:     Chest wall: No tenderness.  Abdominal:     General: Bowel sounds are normal. There is no distension or abdominal bruit.     Palpations: Abdomen is soft. There is no hepatomegaly, splenomegaly, mass or pulsatile mass.     Tenderness: There is no abdominal tenderness.  Musculoskeletal:        General: Normal range of motion.  Cervical back: Normal range of motion and neck supple.  Lymphadenopathy:     Cervical: No cervical adenopathy.  Skin:    General: Skin is warm and dry.  Neurological:     Mental Status: She is alert and oriented to person, place, and time.     Deep Tendon Reflexes: Reflexes are normal and symmetric.  Psychiatric:        Behavior: Behavior normal.        Thought Content: Thought content normal.        Judgment: Judgment normal.     BP 119/64   Pulse 87   Temp 97.6 F (36.4 C)   Ht 5\' 5"  (1.651 m)   Wt 162 lb 12.8 oz (73.8 kg)   SpO2 98%   BMI 27.09 kg/m   Hgba1c 7.9%      Assessment & Plan:  Catherine Cooper comes in today with chief complaint of Medical Management of Chronic Issues   Diagnosis and orders addressed:  1. Primary hypertension Low sodium diet  2. Mixed hyperlipidemia Low fat diet  3. Type 2 diabetes mellitus with diabetic polyneuropathy, without long-term current use of insulin (HCC) Continue to watch carbs in diet Increase trulcity to 1.5mg  weekly - Bayer DCA Hb A1c Waived - Dulaglutide (TRULICITY) 1.5 GB/0.2XJ SOPN; Inject 1.5 mg into the skin once a week.  Dispense: 6 mL; Refill: 1 - gabapentin (NEURONTIN) 100 MG capsule; Take 1 capsule (100 mg total) by mouth daily.  Dispense: 90 capsule; Refill: 1 - FARXIGA 10 MG TABS tablet; Take 1 tablet (10 mg total) by mouth every morning.  Dispense: 90 tablet; Refill: 1  4. Stage 3a chronic kidney disease ([place]) Labs pending  5. Functional diarrhea  6. GAD (generalized anxiety  disorder) Stress management - LORazepam (ATIVAN) 0.5 MG tablet; TAKE 1 TABLET TWICE DAILY AS NEEDED FOR ANXIETY OR SLEEP  Dispense: 60 tablet; Refill: 1  7. Age-related osteoporosis without current pathological fracture dexascan to be done today  8. BMI 28.0-28.9,adult Discussed diet and exercise for person with BMI >25 Will recheck weight in 3-6 months   9. Gastroesophageal reflux disease without esophagitis Avoid spicy foods Do not eat 2 hours prior to bedtime - DEXILANT 60 MG capsule; Take 1 capsule (60 mg total) by mouth daily.  Dispense: 90 capsule; Refill: 1   Labs pending Health Maintenance reviewed Diet and exercise encouraged  Follow up plan: 3 months   Mary-Margaret Hassell Done, FNP

## 2020-09-12 DIAGNOSIS — Z780 Asymptomatic menopausal state: Secondary | ICD-10-CM | POA: Diagnosis not present

## 2020-09-12 DIAGNOSIS — M810 Age-related osteoporosis without current pathological fracture: Secondary | ICD-10-CM | POA: Diagnosis not present

## 2020-09-20 ENCOUNTER — Telehealth: Payer: Self-pay | Admitting: *Deleted

## 2020-10-18 DIAGNOSIS — M79676 Pain in unspecified toe(s): Secondary | ICD-10-CM | POA: Diagnosis not present

## 2020-10-18 DIAGNOSIS — B351 Tinea unguium: Secondary | ICD-10-CM | POA: Diagnosis not present

## 2020-10-19 ENCOUNTER — Telehealth: Payer: Self-pay

## 2020-10-22 NOTE — Telephone Encounter (Signed)
Patient aware and verbalized understanding. Advised of CDC recommendations patient states she is a lot better.

## 2020-10-23 ENCOUNTER — Ambulatory Visit: Payer: Medicare Other | Admitting: Nurse Practitioner

## 2020-11-01 DIAGNOSIS — H401212 Low-tension glaucoma, right eye, moderate stage: Secondary | ICD-10-CM | POA: Diagnosis not present

## 2020-11-01 DIAGNOSIS — H401221 Low-tension glaucoma, left eye, mild stage: Secondary | ICD-10-CM | POA: Diagnosis not present

## 2020-11-01 DIAGNOSIS — E113511 Type 2 diabetes mellitus with proliferative diabetic retinopathy with macular edema, right eye: Secondary | ICD-10-CM | POA: Diagnosis not present

## 2020-11-01 DIAGNOSIS — H353132 Nonexudative age-related macular degeneration, bilateral, intermediate dry stage: Secondary | ICD-10-CM | POA: Diagnosis not present

## 2020-11-01 DIAGNOSIS — E113312 Type 2 diabetes mellitus with moderate nonproliferative diabetic retinopathy with macular edema, left eye: Secondary | ICD-10-CM | POA: Diagnosis not present

## 2020-11-01 LAB — HM DIABETES EYE EXAM

## 2020-11-15 ENCOUNTER — Other Ambulatory Visit: Payer: Self-pay | Admitting: Nurse Practitioner

## 2020-11-15 DIAGNOSIS — E1142 Type 2 diabetes mellitus with diabetic polyneuropathy: Secondary | ICD-10-CM

## 2020-12-12 ENCOUNTER — Encounter (INDEPENDENT_AMBULATORY_CARE_PROVIDER_SITE_OTHER): Payer: Self-pay

## 2020-12-14 ENCOUNTER — Other Ambulatory Visit: Payer: Self-pay | Admitting: *Deleted

## 2020-12-14 NOTE — Progress Notes (Signed)
Pt is no longer on Prolia - is there anything else you would like her to take?  She had been under a grant program and could only get 1 inj a year. Hasn't taken in over a year.

## 2020-12-14 NOTE — Progress Notes (Signed)
When was the last time she did fosamax?

## 2020-12-17 NOTE — Progress Notes (Signed)
Pt has an appt tomorrow with MMM and from the chart it looks like she stopped taking Fosamax in 2016. When I spoke with the pt on the phone she felt like it was prior to then when she had stopped Fosamax but will discuss at visit tomorrow.

## 2020-12-18 ENCOUNTER — Encounter: Payer: Self-pay | Admitting: Nurse Practitioner

## 2020-12-18 ENCOUNTER — Ambulatory Visit (INDEPENDENT_AMBULATORY_CARE_PROVIDER_SITE_OTHER): Payer: Medicare Other | Admitting: Nurse Practitioner

## 2020-12-18 ENCOUNTER — Other Ambulatory Visit: Payer: Self-pay

## 2020-12-18 VITALS — BP 140/82 | HR 99 | Temp 97.6°F | Resp 20 | Ht 65.0 in | Wt 160.0 lb

## 2020-12-18 DIAGNOSIS — I10 Essential (primary) hypertension: Secondary | ICD-10-CM | POA: Diagnosis not present

## 2020-12-18 DIAGNOSIS — Z6828 Body mass index (BMI) 28.0-28.9, adult: Secondary | ICD-10-CM

## 2020-12-18 DIAGNOSIS — N1831 Chronic kidney disease, stage 3a: Secondary | ICD-10-CM | POA: Diagnosis not present

## 2020-12-18 DIAGNOSIS — E782 Mixed hyperlipidemia: Secondary | ICD-10-CM

## 2020-12-18 DIAGNOSIS — M810 Age-related osteoporosis without current pathological fracture: Secondary | ICD-10-CM | POA: Diagnosis not present

## 2020-12-18 DIAGNOSIS — E1142 Type 2 diabetes mellitus with diabetic polyneuropathy: Secondary | ICD-10-CM

## 2020-12-18 DIAGNOSIS — K219 Gastro-esophageal reflux disease without esophagitis: Secondary | ICD-10-CM | POA: Diagnosis not present

## 2020-12-18 DIAGNOSIS — K591 Functional diarrhea: Secondary | ICD-10-CM | POA: Diagnosis not present

## 2020-12-18 DIAGNOSIS — E113551 Type 2 diabetes mellitus with stable proliferative diabetic retinopathy, right eye: Secondary | ICD-10-CM | POA: Diagnosis not present

## 2020-12-18 DIAGNOSIS — Z794 Long term (current) use of insulin: Secondary | ICD-10-CM | POA: Diagnosis not present

## 2020-12-18 DIAGNOSIS — F411 Generalized anxiety disorder: Secondary | ICD-10-CM

## 2020-12-18 LAB — CMP14+EGFR
ALT: 14 IU/L (ref 0–32)
AST: 15 IU/L (ref 0–40)
Albumin/Globulin Ratio: 1.6 (ref 1.2–2.2)
Albumin: 4.2 g/dL (ref 3.7–4.7)
Alkaline Phosphatase: 107 IU/L (ref 44–121)
BUN/Creatinine Ratio: 13 (ref 12–28)
BUN: 12 mg/dL (ref 8–27)
Bilirubin Total: 0.4 mg/dL (ref 0.0–1.2)
CO2: 25 mmol/L (ref 20–29)
Calcium: 9.2 mg/dL (ref 8.7–10.3)
Chloride: 103 mmol/L (ref 96–106)
Creatinine, Ser: 0.93 mg/dL (ref 0.57–1.00)
Globulin, Total: 2.6 g/dL (ref 1.5–4.5)
Glucose: 183 mg/dL — ABNORMAL HIGH (ref 65–99)
Potassium: 4.7 mmol/L (ref 3.5–5.2)
Sodium: 142 mmol/L (ref 134–144)
Total Protein: 6.8 g/dL (ref 6.0–8.5)
eGFR: 62 mL/min/{1.73_m2} (ref >59)

## 2020-12-18 LAB — CBC WITH DIFFERENTIAL/PLATELET
Basophils Absolute: 0.1 10*3/uL (ref 0.0–0.2)
Basos: 1 %
EOS (ABSOLUTE): 0.4 10*3/uL (ref 0.0–0.4)
Eos: 7 %
Hematocrit: 42 % (ref 34.0–46.6)
Hemoglobin: 14.3 g/dL (ref 11.1–15.9)
Immature Grans (Abs): 0 10*3/uL (ref 0.0–0.1)
Immature Granulocytes: 0 %
Lymphocytes Absolute: 2.2 10*3/uL (ref 0.7–3.1)
Lymphs: 36 %
MCH: 32.6 pg (ref 26.6–33.0)
MCHC: 34 g/dL (ref 31.5–35.7)
MCV: 96 fL (ref 79–97)
Monocytes Absolute: 0.4 10*3/uL (ref 0.1–0.9)
Monocytes: 7 %
Neutrophils Absolute: 3 10*3/uL (ref 1.4–7.0)
Neutrophils: 49 %
Platelets: 158 10*3/uL (ref 150–450)
RBC: 4.39 x10E6/uL (ref 3.77–5.28)
RDW: 11.8 % (ref 11.7–15.4)
WBC: 6 10*3/uL (ref 3.4–10.8)

## 2020-12-18 LAB — LIPID PANEL
Chol/HDL Ratio: 4.1 ratio (ref 0.0–4.4)
Cholesterol, Total: 171 mg/dL (ref 100–199)
HDL: 42 mg/dL (ref >39)
LDL Chol Calc (NIH): 101 mg/dL — ABNORMAL HIGH (ref 0–99)
Triglycerides: 159 mg/dL — ABNORMAL HIGH (ref 0–149)
VLDL Cholesterol Cal: 28 mg/dL (ref 5–40)

## 2020-12-18 LAB — BAYER DCA HB A1C WAIVED: HB A1C (BAYER DCA - WAIVED): 7.9 % — ABNORMAL HIGH (ref <7.0)

## 2020-12-18 MED ORDER — TRULICITY 3 MG/0.5ML ~~LOC~~ SOAJ: 3.0000 mg | SUBCUTANEOUS | 1 refills | Status: DC

## 2020-12-18 MED ORDER — FARXIGA 10 MG PO TABS: 10.0000 mg | ORAL_TABLET | Freq: Every morning | ORAL | 1 refills | Status: DC

## 2020-12-18 MED ORDER — GLIPIZIDE ER 10 MG PO TB24: 10.0000 mg | ORAL_TABLET | Freq: Every day | ORAL | 1 refills | Status: DC

## 2020-12-18 MED ORDER — GABAPENTIN 100 MG PO CAPS: 100.0000 mg | ORAL_CAPSULE | Freq: Every day | ORAL | 1 refills | Status: DC

## 2020-12-18 MED ORDER — DEXILANT 60 MG PO CPDR: 1.0000 | DELAYED_RELEASE_CAPSULE | Freq: Every day | ORAL | 1 refills | Status: DC

## 2020-12-18 NOTE — Progress Notes (Signed)
Subjective:    Patient ID: Catherine Cooper, female    DOB: December 28, 1940, 80 y.o.   MRN: [id number]   Chief Complaint: Medical Management of Chronic Issues    HPI:  1. Primary hypertension No c/o chest pain, sob or headache. Does not check blood pressure at home.  BP Readings from Last 3 Encounters:  09/11/20 119/64  06/11/20 139/80  02/21/20 131/70     2. Mixed hyperlipidemia She does try to watch diet but doe sno dedicated exercise. Lab Results  Component Value Date   CHOL 174 09/11/2020   HDL 45 09/11/2020   LDLCALC 100 (H) 09/11/2020   TRIG 169 (H) 09/11/2020   CHOLHDL 3.9 09/11/2020     3. Type 2 diabetes mellitus with diabetic polyneuropathy, without long-term current use of insulin (HCC) Fasting blood sugar are running 150-180 most mornings with several being over 200. She denies any low blood sugars. Lab Results  Component Value Date   HGBA1C 7.9 (H) 09/11/2020     4. 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 Vision is stable for now.  5. Stage 3a chronic kidney disease (HCC) No problems void and no edema to speak of. Lab Results  Component Value Date   CREATININE 0.91 09/11/2020     6. Gastroesophageal reflux disease without esophagitis is on dexilant daily and still has occasional heart burn symptoms.  7. Functional diarrhea Has diarrhea several times a week. We had to stop her metformin because of the diarrhea.  8. GAD (generalized anxiety disorder) Is on ativan as needed. She only takes a couple of times as week when really needed.  9. Age-related osteoporosis without current pathological fracture Last dexascan was done 2/1/21which showed no significant change since last test. Still osteoporosis. She was on prolia at one point , but she cannot get any help with it and it would cost her 1500$.  10. BMI 28.0-28.9,adult No recent weight  changes Wt Readings from Last 3 Encounters:  12/18/20 160 lb (72.6 kg)  09/11/20 162 lb 12.8 oz (73.8 kg)  06/11/20 160 lb (72.6 kg)   BMI Readings from Last 3 Encounters:  12/18/20 26.63 kg/m  09/11/20 27.09 kg/m  06/11/20 26.63 kg/m       Outpatient Encounter Medications as of 12/18/2020  Medication Sig  . ALPHAGAN P 0.1 % SOLN Place 1 drop into both eyes 2 (two) times daily.   Marland Kitchen aspirin 81 MG tablet Take 81 mg by mouth every evening.   . bifidobacterium infantis (ALIGN) capsule Take 1 capsule by mouth daily.  . calcium citrate-vitamin D (CITRACAL+D) 315-200 MG-UNIT tablet Take 1 tablet by mouth 2 (two) times daily.  . Cholecalciferol (VITAMIN D3) 2000 UNITS TABS Take 1 capsule by mouth daily.   Marland Kitchen DEXILANT 60 MG capsule Take 1 capsule (60 mg total) by mouth daily.  . Dulaglutide (TRULICITY) 1.5 NI/7.7OE SOPN Inject 1.5 mg into the skin once a week.  Marland Kitchen FARXIGA 10 MG TABS tablet Take 1 tablet (10 mg total) by mouth every morning.  . gabapentin (NEURONTIN) 100 MG capsule Take 1 capsule (100 mg total) by mouth daily.  Marland Kitchen GLIPIZIDE XL 10 MG 24 hr tablet TAKE 1 TABLET DAILY WITH   BREAKFAST  . ibuprofen (ADVIL,MOTRIN) 800 MG tablet Take 1 tablet (800 mg total) by mouth every 6 (six) hours as needed for pain.  Marland Kitchen latanoprost (XALATAN) 0.005 % ophthalmic solution Place  1 drop into both eyes at bedtime.   Marland Kitchen LORazepam (ATIVAN) 0.5 MG tablet TAKE 1 TABLET TWICE DAILY AS NEEDED FOR ANXIETY OR SLEEP  . Multiple Vitamins-Minerals (PRESERVISION/LUTEIN PO) Take by mouth.  Glory Rosebush ULTRA test strip TEST BLOOD SUGAR ONCE DAILYAND AS NEEDED  . SSD 1 % cream   . vitamin B-12 (CYANOCOBALAMIN) 1000 MCG tablet Take 1,000 mcg by mouth daily.     No facility-administered encounter medications on file as of 12/18/2020.    Past Surgical History:  Procedure Laterality Date  .  vitrectomy  02/23/08   posterior; right eye  . ABDOMINAL HYSTERECTOMY    . ACHILLES TENDON SURGERY Left 03/28/2016    Procedure: Reconstruction Left Achilles;  Surgeon: Newt Minion, MD;  Location: [place];  Service: Orthopedics;  Laterality: Left;  . 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 COLONOSCOPY  2013  . EYE SURGERY    . FOOT SURGERY     Right foot / left heel  . glaucome surgery right eye    . skin cancer removed right side of nose    . SKIN GRAFT Right 2007   foot  . TUBAL LIGATION      Family History  Problem Relation Age of Onset  . Dementia Father   . Diabetes Father   . Coronary artery disease Father 33  . Hip fracture Father   . Alzheimer's disease Father   . Atrial fibrillation Mother   . Osteoporosis Mother     New complaints: None today  Social history: Lives with her husband  Controlled substance contract: 09/13/20    Review of Systems  Constitutional: Negative for diaphoresis.  Eyes: Negative for pain.  Respiratory: Negative for shortness of breath.   Cardiovascular: Negative for chest pain, palpitations and leg swelling.  Gastrointestinal: Negative for abdominal pain.  Endocrine: Negative for polydipsia.  Musculoskeletal: Positive for arthralgias (hands and wrists).  Skin: Negative for rash.  Neurological: Negative for dizziness, weakness and headaches.  Hematological: Does not bruise/bleed easily.  All other systems reviewed and are negative.      Objective:   Physical Exam Vitals and nursing note reviewed.  Constitutional:      General: She is not in acute distress.    Appearance: Normal appearance. She is well-developed and well-nourished.  HENT:     Head: Normocephalic.     Nose: Nose normal.     Mouth/Throat:     Mouth: Oropharynx is clear and moist.  Eyes:     Extraocular Movements: EOM normal.     Pupils: Pupils are equal, round, and reactive to light.  Neck:     Vascular: No carotid bruit or JVD.  Cardiovascular:      Rate and Rhythm: Normal rate and regular rhythm.     Pulses: Intact distal pulses.     Heart sounds: Normal heart sounds.  Pulmonary:     Effort: Pulmonary effort is normal. No respiratory distress.     Breath sounds: Normal breath sounds. No wheezing or rales.  Chest:     Chest wall: No tenderness.  Abdominal:     General: Bowel sounds are normal. There is no distension or abdominal bruit. Aorta is normal.     Palpations: Abdomen is soft. There is no hepatomegaly, splenomegaly, mass or pulsatile mass.     Tenderness: There is no abdominal tenderness.  Musculoskeletal:  General: No edema. Normal range of motion.     Cervical back: Normal range of motion and neck supple.  Lymphadenopathy:     Cervical: No cervical adenopathy.  Skin:    General: Skin is warm and dry.  Neurological:     Mental Status: She is alert and oriented to person, place, and time.     Deep Tendon Reflexes: Reflexes are normal and symmetric.  Psychiatric:        Mood and Affect: Mood and affect normal.        Behavior: Behavior normal.        Thought Content: Thought content normal.        Judgment: Judgment normal.     hgba1c 7.9%  BP 140/82   Pulse 99   Temp 97.6 F (36.4 C) (Temporal)   Resp 20   Ht 5' 5"  (1.651 m)   Wt 160 lb (72.6 kg)   SpO2 98%   BMI 26.63 kg/m        Assessment & Plan:  Catherine Cooper comes in today with chief complaint of Medical Management of Chronic Issues   Diagnosis and orders addressed:  1. Primary hypertension low sodium diet - CBC with Differential/Platelet - CMP14+EGFR  2. Mixed hyperlipidemia Low fat diet - Lipid panel  3. Type 2 diabetes mellitus with diabetic polyneuropathy, without long-term current use of insulin (HCC) Stricter carb counting Increased trulicity to 24m weekly - Bayer DCA Hb A1c Waived - Dulaglutide (TRULICITY) 3 MER/7.4YCSOPN; Inject 3 mg as directed once a week.  Dispense: 6 mL; Refill: 1 - glipiZIDE (GLIPIZIDE XL) 10 MG  24 hr tablet; Take 1 tablet (10 mg total) by mouth daily with breakfast.  Dispense: 90 tablet; Refill: 1 - FARXIGA 10 MG TABS tablet; Take 1 tablet (10 mg total) by mouth every morning.  Dispense: 90 tablet; Refill: 1 - gabapentin (NEURONTIN) 100 MG capsule; Take 1 capsule (100 mg total) by mouth daily.  Dispense: 90 capsule; Refill: 1  4. 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 Stage 3a chronic kidney disease ([facility] [facility] pending  6. Gastroesophageal reflux disease without esophagitis Avoid spicy foods Do not eat 2 hours prior to bedtime - DEXILANT 60 MG capsule; Take 1 capsule (60 mg total) by mouth daily.  Dispense: 90 capsule; Refill: 1  7. Functional diarrhea Imodium as needed  8. GAD (generalized anxiety disorder) Stress management  9. Age-related osteoporosis without current pathological fracture Will decide on possibly starting on fosamax again  10. BMI 28.0-28.9,adult Discussed diet and exercise for person with BMI >25 Will recheck weight in 3-6 months    Labs pending Health Maintenance reviewed Diet and exercise encouraged  Follow up plan: 3 months   Mary-Margaret MHassell Done FNP

## 2020-12-18 NOTE — Addendum Note (Signed)
Addended by: Antonietta Barcelona D on: 12/18/2020 10:27 AM   Modules accepted: Orders

## 2020-12-18 NOTE — Progress Notes (Signed)
Rxs failed. resent °

## 2020-12-18 NOTE — Patient Instructions (Signed)

## 2020-12-27 DIAGNOSIS — L97511 Non-pressure chronic ulcer of other part of right foot limited to breakdown of skin: Secondary | ICD-10-CM | POA: Diagnosis not present

## 2021-02-07 ENCOUNTER — Other Ambulatory Visit: Payer: Self-pay | Admitting: Nurse Practitioner

## 2021-02-07 DIAGNOSIS — E1142 Type 2 diabetes mellitus with diabetic polyneuropathy: Secondary | ICD-10-CM

## 2021-03-07 DIAGNOSIS — E1142 Type 2 diabetes mellitus with diabetic polyneuropathy: Secondary | ICD-10-CM | POA: Diagnosis not present

## 2021-03-07 DIAGNOSIS — L84 Corns and callosities: Secondary | ICD-10-CM | POA: Diagnosis not present

## 2021-03-07 DIAGNOSIS — B351 Tinea unguium: Secondary | ICD-10-CM | POA: Diagnosis not present

## 2021-03-07 DIAGNOSIS — M79676 Pain in unspecified toe(s): Secondary | ICD-10-CM | POA: Diagnosis not present

## 2021-03-18 ENCOUNTER — Encounter (INDEPENDENT_AMBULATORY_CARE_PROVIDER_SITE_OTHER): Payer: Self-pay

## 2021-03-18 DIAGNOSIS — H401212 Low-tension glaucoma, right eye, moderate stage: Secondary | ICD-10-CM | POA: Diagnosis not present

## 2021-03-18 DIAGNOSIS — H401221 Low-tension glaucoma, left eye, mild stage: Secondary | ICD-10-CM | POA: Diagnosis not present

## 2021-03-26 ENCOUNTER — Encounter: Payer: Self-pay | Admitting: Nurse Practitioner

## 2021-03-26 ENCOUNTER — Other Ambulatory Visit: Payer: Self-pay

## 2021-03-26 ENCOUNTER — Ambulatory Visit (INDEPENDENT_AMBULATORY_CARE_PROVIDER_SITE_OTHER): Payer: Medicare Other | Admitting: Nurse Practitioner

## 2021-03-26 VITALS — BP 136/86 | HR 89 | Temp 97.6°F | Resp 20 | Ht 65.0 in | Wt 157.0 lb

## 2021-03-26 DIAGNOSIS — K591 Functional diarrhea: Secondary | ICD-10-CM

## 2021-03-26 DIAGNOSIS — M810 Age-related osteoporosis without current pathological fracture: Secondary | ICD-10-CM

## 2021-03-26 DIAGNOSIS — E782 Mixed hyperlipidemia: Secondary | ICD-10-CM | POA: Diagnosis not present

## 2021-03-26 DIAGNOSIS — Z23 Encounter for immunization: Secondary | ICD-10-CM

## 2021-03-26 DIAGNOSIS — I10 Essential (primary) hypertension: Secondary | ICD-10-CM

## 2021-03-26 DIAGNOSIS — K219 Gastro-esophageal reflux disease without esophagitis: Secondary | ICD-10-CM

## 2021-03-26 DIAGNOSIS — E113551 Type 2 diabetes mellitus with stable proliferative diabetic retinopathy, right eye: Secondary | ICD-10-CM

## 2021-03-26 DIAGNOSIS — E1142 Type 2 diabetes mellitus with diabetic polyneuropathy: Secondary | ICD-10-CM | POA: Diagnosis not present

## 2021-03-26 DIAGNOSIS — E113492 Type 2 diabetes mellitus with severe nonproliferative diabetic retinopathy without macular edema, left eye: Secondary | ICD-10-CM

## 2021-03-26 DIAGNOSIS — N1831 Chronic kidney disease, stage 3a: Secondary | ICD-10-CM

## 2021-03-26 DIAGNOSIS — F411 Generalized anxiety disorder: Secondary | ICD-10-CM

## 2021-03-26 DIAGNOSIS — Z794 Long term (current) use of insulin: Secondary | ICD-10-CM

## 2021-03-26 DIAGNOSIS — Z6828 Body mass index (BMI) 28.0-28.9, adult: Secondary | ICD-10-CM

## 2021-03-26 LAB — LIPID PANEL
Chol/HDL Ratio: 4 ratio (ref 0.0–4.4)
Cholesterol, Total: 189 mg/dL (ref 100–199)
HDL: 47 mg/dL (ref >39)
LDL Chol Calc (NIH): 114 mg/dL — ABNORMAL HIGH (ref 0–99)
Triglycerides: 159 mg/dL — ABNORMAL HIGH (ref 0–149)
VLDL Cholesterol Cal: 28 mg/dL (ref 5–40)

## 2021-03-26 LAB — CBC WITH DIFFERENTIAL/PLATELET
Basophils Absolute: 0.1 10*3/uL (ref 0.0–0.2)
Basos: 1 %
EOS (ABSOLUTE): 0.3 10*3/uL (ref 0.0–0.4)
Eos: 5 %
Hematocrit: 43.6 % (ref 34.0–46.6)
Hemoglobin: 14.2 g/dL (ref 11.1–15.9)
Immature Grans (Abs): 0 10*3/uL (ref 0.0–0.1)
Immature Granulocytes: 0 %
Lymphocytes Absolute: 1.7 10*3/uL (ref 0.7–3.1)
Lymphs: 28 %
MCH: 31 pg (ref 26.6–33.0)
MCHC: 32.6 g/dL (ref 31.5–35.7)
MCV: 95 fL (ref 79–97)
Monocytes Absolute: 0.4 10*3/uL (ref 0.1–0.9)
Monocytes: 7 %
Neutrophils Absolute: 3.5 10*3/uL (ref 1.4–7.0)
Neutrophils: 59 %
Platelets: 164 10*3/uL (ref 150–450)
RBC: 4.58 x10E6/uL (ref 3.77–5.28)
RDW: 12.1 % (ref 11.7–15.4)
WBC: 6.1 10*3/uL (ref 3.4–10.8)

## 2021-03-26 LAB — CMP14+EGFR
ALT: 13 IU/L (ref 0–32)
AST: 13 IU/L (ref 0–40)
Albumin/Globulin Ratio: 1.8 (ref 1.2–2.2)
Albumin: 4.3 g/dL (ref 3.7–4.7)
Alkaline Phosphatase: 98 IU/L (ref 44–121)
BUN/Creatinine Ratio: 11 — ABNORMAL LOW (ref 12–28)
BUN: 10 mg/dL (ref 8–27)
Bilirubin Total: 0.5 mg/dL (ref 0.0–1.2)
CO2: 22 mmol/L (ref 20–29)
Calcium: 9.5 mg/dL (ref 8.7–10.3)
Chloride: 103 mmol/L (ref 96–106)
Creatinine, Ser: 0.92 mg/dL (ref 0.57–1.00)
Globulin, Total: 2.4 g/dL (ref 1.5–4.5)
Glucose: 159 mg/dL — ABNORMAL HIGH (ref 65–99)
Potassium: 5.3 mmol/L — ABNORMAL HIGH (ref 3.5–5.2)
Sodium: 143 mmol/L (ref 134–144)
Total Protein: 6.7 g/dL (ref 6.0–8.5)
eGFR: 63 mL/min/{1.73_m2} (ref >59)

## 2021-03-26 LAB — BAYER DCA HB A1C WAIVED: HB A1C (BAYER DCA - WAIVED): 7.4 % — ABNORMAL HIGH (ref <7.0)

## 2021-03-26 MED ORDER — LORAZEPAM 0.5 MG PO TABS: ORAL_TABLET | ORAL | 1 refills | Status: DC

## 2021-03-26 NOTE — Progress Notes (Signed)
Subjective:    Patient ID: Catherine Cooper, female    DOB: 1940-12-06, 80 y.o.   MRN: [id number]   Chief Complaint: medical management of chronic issues     HPI:  1. Primary hypertension No c/o chest pain, sob or headache. Does not check blood pressure at home. . BP Readings from Last 3 Encounters:  12/18/20 140/82  09/11/20 119/64  06/11/20 139/80     2. Mixed hyperlipidemia Does try to watch diet and does occasional exercise. She has refused statins in the past. Lab Results  Component Value Date   CHOL 171 12/18/2020   HDL 42 12/18/2020   LDLCALC 101 (H) 12/18/2020   TRIG 159 (H) 12/18/2020   CHOLHDL 4.1 12/18/2020     3. Type 2 diabetes mellitus with diabetic polyneuropathy, without long-term current use of insulin (HCC) Fasting blood sugars are running around 120-180. She has had no low reading. Lab Results  Component Value Date   HGBA1C 7.9 (H) 12/18/2020     4. 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  6. Gastroesophageal reflux disease without esophagitis Is on dexilant daily and still has occasional break through symptoms.  7. Age-related osteoporosis without current pathological fracture Last dexascan was done 09/12/20. T score was -2.7. she is currently on nothing. We have been checking on prolia. Injections.  8. Stage 3a chronic kidney disease (HCC) No voiding issues or peripheral edema. Lab Results  Component Value Date   CREATININE 0.93 12/18/2020     9. GAD (generalized anxiety disorder) Is on ativan that she takes prn.  10. Functional diarrhea Has daily. Uses imodium occasionaly.    11. BMI 28.0-28.9,adult No recent weight changes. Wt Readings from Last 3 Encounters:  12/18/20 160 lb (72.6 kg)  09/11/20 162 lb 12.8 oz (73.8 kg)  06/11/20 160 lb (72.6 kg)   BMI Readings from Last 3 Encounters:  03/26/21 26.13 kg/m  12/18/20 26.63 kg/m  09/11/20 27.09 kg/m       Outpatient Encounter Medications as of 03/26/2021  Medication Sig  . ALPHAGAN P 0.1 % SOLN Place 1 drop into both eyes 2 (two) times daily.   Marland Kitchen aspirin 81 MG tablet Take 81 mg by mouth every evening.   . bifidobacterium infantis (ALIGN) capsule Take 1 capsule by mouth daily.  . calcium citrate-vitamin D (CITRACAL+D) 315-200 MG-UNIT tablet Take 1 tablet by mouth 2 (two) times daily.  . Cholecalciferol (VITAMIN D3) 2000 UNITS TABS Take 1 capsule by mouth daily.   Marland Kitchen DEXILANT 60 MG capsule Take 1 capsule (60 mg total) by mouth daily.  . Dulaglutide (TRULICITY) 3 NO/6.7EH SOPN Inject 3 mg as directed once a week.  Marland Kitchen FARXIGA 10 MG TABS tablet TAKE (1) TABLET DAILY IN THE MORNING.  Marland Kitchen gabapentin (NEURONTIN) 100 MG capsule Take 1 capsule (100 mg total) by mouth daily.  Marland Kitchen glipiZIDE (GLIPIZIDE XL) 10 MG 24 hr tablet Take 1 tablet (10 mg total) by mouth daily with breakfast.  . ibuprofen (ADVIL,MOTRIN) 800 MG tablet Take 1 tablet (800 mg total) by mouth every 6 (six) hours as needed for pain.  Marland Kitchen latanoprost (XALATAN) 0.005 % ophthalmic solution Place 1 drop into both  eyes at bedtime.   Marland Kitchen LORazepam (ATIVAN) 0.5 MG tablet TAKE 1 TABLET TWICE DAILY AS NEEDED FOR ANXIETY OR SLEEP  . Multiple Vitamins-Minerals (PRESERVISION/LUTEIN PO) Take by mouth.  Glory Rosebush ULTRA test strip TEST BLOOD SUGAR ONCE DAILYAND AS NEEDED Dx E11.9  . SSD 1 % cream   . vitamin B-12 (CYANOCOBALAMIN) 1000 MCG tablet Take 1,000 mcg by mouth daily.     No facility-administered encounter medications on file as of 03/26/2021.    Past Surgical History:  Procedure Laterality Date  .  vitrectomy  02/23/08   posterior; right eye  .  ABDOMINAL HYSTERECTOMY    . ACHILLES TENDON SURGERY Left 03/28/2016   Procedure: Reconstruction Left Achilles;  Surgeon: Newt Minion, MD;  Location: [facility];  Service: Orthopedics;  Laterality: Left;  . 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 COLONOSCOPY  2013  . EYE SURGERY    . FOOT SURGERY     Right foot / left heel  . glaucome surgery right eye    . skin cancer removed right side of nose    . SKIN GRAFT Right 2007   foot  . TUBAL LIGATION      Family History  Problem Relation Age of Onset  . Dementia Father   . Diabetes Father   . Coronary artery disease Father 15  . Hip fracture Father   . Alzheimer's disease Father   . Atrial fibrillation Mother   . Osteoporosis Mother     New complaints: none  Social history: Lives with her husband  Controlled substance contract: 09/13/20     Review of Systems  Constitutional:  Negative for diaphoresis.  Eyes:  Negative for pain.  Respiratory:  Negative for shortness of breath.   Cardiovascular:  Negative for chest pain, palpitations and leg swelling.  Gastrointestinal:  Negative for abdominal pain.  Endocrine: Negative for polydipsia.  Skin:  Negative for rash.  Neurological:  Negative for dizziness, weakness and headaches.  Hematological:  Does not bruise/bleed easily.  All other systems reviewed and are negative.     Objective:   Physical Exam Vitals and nursing note reviewed.  Constitutional:      General: She is not in acute distress.    Appearance: Normal appearance. She is well-developed.  HENT:     Head: Normocephalic.     Right Ear: Tympanic membrane normal.     Left Ear: Tympanic membrane normal.     Nose: Nose normal.     Mouth/Throat:     Mouth: Mucous membranes are moist.  Eyes:     Pupils: Pupils are equal, round, and reactive to light.  Neck:     Vascular: No carotid bruit or  JVD.  Cardiovascular:     Rate and Rhythm: Normal rate and regular rhythm.     Heart sounds: Normal heart sounds.  Pulmonary:     Effort: Pulmonary effort is normal. No respiratory distress.     Breath sounds: Normal breath sounds. No wheezing or rales.  Chest:     Chest wall: No tenderness.  Abdominal:     General: Bowel sounds are normal. There is no distension or abdominal bruit.     Palpations: Abdomen is soft. There is no hepatomegaly, splenomegaly, mass or pulsatile mass.     Tenderness: There is no abdominal tenderness.  Musculoskeletal:  General: Normal range of motion.     Cervical back: Normal range of motion and neck supple.  Lymphadenopathy:     Cervical: No cervical adenopathy.  Skin:    General: Skin is warm and dry.  Neurological:     Mental Status: She is alert and oriented to person, place, and time.     Deep Tendon Reflexes: Reflexes are normal and symmetric.  Psychiatric:        Behavior: Behavior normal.        Thought Content: Thought content normal.        Judgment: Judgment normal.   BP 136/86   Pulse 89   Temp 97.6 F (36.4 C) (Temporal)   Resp 20   Ht _0  (1.651 m)   Wt 157 lb (71.2 kg)   BMI 26.13 kg/m    HGBA1C 7.4%       Assessment & Plan:   NAVEENA EYMAN comes in today with chief complaint of Medical Management of Chronic Issues   Diagnosis and orders addressed:  1. Primary hypertension Low sodium diet - CBC with Differential/Platelet - CMP14+EGFR  2. Mixed hyperlipidemia Low fat diet - Lipid panel  3. Type 2 diabetes mellitus with diabetic polyneuropathy, without long-term current use of insulin (HCC) Watch carbs in di - Bayer DCA Hb A1c Waived - Microalbumin / creatinine urine ratio  4. 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 Type 2 diabetes mellitus with  stable proliferative retinopathy of right eye, with long-term current use of insulin ([place]) Keep follow up with eye doctor  6. Gastroesophageal reflux disease without esophagitis Continue dexalant  7. Age-related osteoporosis without current pathological fracture Weight bearing exercises  8. Stage 3a chronic kidney disease ([place]) Labs pending  9. GAD (generalized anxiety disorder) Stress management - LORazepam (ATIVAN) 0.5 MG tablet; TAKE 1 TABLET TWICE DAILY AS NEEDED FOR ANXIETY OR SLEEP  Dispense: 60 tablet; Refill: 1  10. Functional diarrhea Imodium as needed  11. BMI 28.0-28.9,adult Discussed diet and exercise for person with BMI >25 Will recheck weight in 3-6 months    Labs pending Health Maintenance reviewed Diet and exercise encouraged  Follow up plan: 3 months   Mary-Margaret Hassell Done, FNP

## 2021-03-26 NOTE — Addendum Note (Signed)
Addended by: Rolena Infante on: 03/26/2021 04:51 PM   Modules accepted: Orders

## 2021-03-26 NOTE — Patient Instructions (Signed)
https://www.diabeteseducator.org/docs/default-source/living-with-diabetes/conquering-the-grocery-store-v1.pdf?sfvrsn=4">  Carbohydrate Counting for Diabetes Mellitus, Adult Carbohydrate counting is a method of keeping track of how many carbohydrates you eat. Eating carbohydrates naturally increases the amount of sugar (glucose) in the blood. Counting how many carbohydrates you eat improves your bloodglucose control, which helps you manage your diabetes. It is important to know how many carbohydrates you can safely have in each meal. This is different for every person. A dietitian can help you make a meal plan and calculate how many carbohydrates you should have at each meal andsnack. What foods contain carbohydrates? Carbohydrates are found in the following foods: Grains, such as breads and cereals. Dried beans and soy products. Starchy vegetables, such as potatoes, peas, and corn. Fruit and fruit juices. Milk and yogurt. Sweets and snack foods, such as cake, cookies, candy, chips, and soft drinks. How do I count carbohydrates in foods? There are two ways to count carbohydrates in food. You can read food labels or learn standard serving sizes of foods. You can use either of the methods or acombination of both. Using the Nutrition Facts label The Nutrition Facts list is included on the labels of almost all packaged foods and beverages in the [place] It includes: The serving size. Information about nutrients in each serving, including the grams (g) of carbohydrate per serving. To use the Nutrition Facts: Decide how many servings you will have. Multiply the number of servings by the number of carbohydrates per serving. The resulting number is the total amount of carbohydrates that you will be having. Learning the standard serving sizes of foods When you eat carbohydrate foods that are not packaged or do not include Nutrition Facts on the label, you need to measure the servings in order to count the  amount of carbohydrates. Measure the foods that you will eat with a food scale or measuring cup, if needed. Decide how many standard-size servings you will eat. Multiply the number of servings by 15. For foods that contain carbohydrates, one serving equals 15 g of carbohydrates. For example, if you eat 2 cups or 10 oz (300 g) of strawberries, you will have eaten 2 servings and 30 g of carbohydrates (2 servings x 15 g = 30 g). For foods that have more than one food mixed, such as soups and casseroles, you must count the carbohydrates in each food that is included. The following list contains standard serving sizes of common carbohydrate-rich foods. Each of these servings has about 15 g of carbohydrates: 1 slice of bread. 1 six-inch (15 cm) tortilla. ? cup or 2 oz (53 g) cooked rice or pasta.  cup or 3 oz (85 g) cooked or canned, drained and rinsed beans or lentils.  cup or 3 oz (85 g) starchy vegetable, such as peas, corn, or squash.  cup or 4 oz (120 g) hot cereal.  cup or 3 oz (85 g) boiled or mashed potatoes, or  or 3 oz (85 g) of a large baked potato.  cup or 4 fl oz (118 mL) fruit juice. 1 cup or 8 fl oz (237 mL) milk. 1 small or 4 oz (106 g) apple.  or 2 oz (63 g) of a medium banana. 1 cup or 5 oz (150 g) strawberries. 3 cups or 1 oz (24 g) popped popcorn. What is an example of carbohydrate counting? To calculate the number of carbohydrates in this sample meal, follow the stepsshown below. Sample meal 3 oz (85 g) chicken breast. ? cup or 4 oz (106 g) brown rice.    cup or 3 oz (85 g) corn. 1 cup or 8 fl oz (237 mL) milk. 1 cup or 5 oz (150 g) strawberries with sugar-free whipped topping. Carbohydrate calculation Identify the foods that contain carbohydrates: Rice. Corn. Milk. Strawberries. Calculate how many servings you have of each food: 2 servings rice. 1 serving corn. 1 serving milk. 1 serving strawberries. Multiply each number of servings by 15 g: 2 servings  rice x 15 g = 30 g. 1 serving corn x 15 g = 15 g. 1 serving milk x 15 g = 15 g. 1 serving strawberries x 15 g = 15 g. Add together all of the amounts to find the total grams of carbohydrates eaten: 30 g + 15 g + 15 g + 15 g = 75 g of carbohydrates total. What are tips for following this plan? Shopping Develop a meal plan and then make a shopping list. Buy fresh and frozen vegetables, fresh and frozen fruit, dairy, eggs, beans, lentils, and whole grains. Look at food labels. Choose foods that have more fiber and less sugar. Avoid processed foods and foods with added sugars. Meal planning Aim to have the same amount of carbohydrates at each meal and for each snack time. Plan to have regular, balanced meals and snacks. Where to find more information American Diabetes Association: www.diabetes.org Centers for Disease Control and Prevention: www.cdc.gov Summary Carbohydrate counting is a method of keeping track of how many carbohydrates you eat. Eating carbohydrates naturally increases the amount of sugar (glucose) in the blood. Counting how many carbohydrates you eat improves your blood glucose control, which helps you manage your diabetes. A dietitian can help you make a meal plan and calculate how many carbohydrates you should have at each meal and snack. This information is not intended to replace advice given to you by your health care provider. Make sure you discuss any questions you have with your healthcare provider. Document Revised: 09/29/2019 Document Reviewed: 09/30/2019 Elsevier Patient Education  2021 Elsevier Inc.  

## 2021-03-27 LAB — MICROALBUMIN / CREATININE URINE RATIO
Creatinine, Urine: 52.2 mg/dL
Microalb/Creat Ratio: 7 mg/g creat (ref 0–29)
Microalbumin, Urine: 3.8 ug/mL

## 2021-03-28 ENCOUNTER — Other Ambulatory Visit: Payer: Self-pay | Admitting: *Deleted

## 2021-03-28 DIAGNOSIS — E875 Hyperkalemia: Secondary | ICD-10-CM

## 2021-03-29 ENCOUNTER — Other Ambulatory Visit: Payer: Medicare Other

## 2021-03-29 ENCOUNTER — Other Ambulatory Visit: Payer: Self-pay

## 2021-03-29 DIAGNOSIS — E875 Hyperkalemia: Secondary | ICD-10-CM | POA: Diagnosis not present

## 2021-03-30 LAB — BASIC METABOLIC PANEL
BUN/Creatinine Ratio: 10 — ABNORMAL LOW (ref 12–28)
BUN: 10 mg/dL (ref 8–27)
CO2: 24 mmol/L (ref 20–29)
Calcium: 8.9 mg/dL (ref 8.7–10.3)
Chloride: 102 mmol/L (ref 96–106)
Creatinine, Ser: 1.05 mg/dL — ABNORMAL HIGH (ref 0.57–1.00)
Glucose: 223 mg/dL — ABNORMAL HIGH (ref 65–99)
Potassium: 4.6 mmol/L (ref 3.5–5.2)
Sodium: 142 mmol/L (ref 134–144)
eGFR: 54 mL/min/{1.73_m2} — ABNORMAL LOW (ref >59)

## 2021-04-04 ENCOUNTER — Other Ambulatory Visit: Payer: Self-pay

## 2021-04-04 ENCOUNTER — Ambulatory Visit (INDEPENDENT_AMBULATORY_CARE_PROVIDER_SITE_OTHER): Payer: Medicare Other | Admitting: Pharmacist

## 2021-04-04 DIAGNOSIS — E782 Mixed hyperlipidemia: Secondary | ICD-10-CM

## 2021-04-04 MED ORDER — ROSUVASTATIN CALCIUM 10 MG PO TABS: 10.0000 mg | ORAL_TABLET | ORAL | 3 refills | Status: DC

## 2021-04-04 NOTE — Progress Notes (Signed)
04/04/2021 Name: Catherine Cooper MRN: [id number] DOB: Dec 07, 1940  HPI:  Catherine Cooper is a 80 y.o. female patient referred to lipid clinic by PCP. 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 ([place])    left foot   Diabetes mellitus    type II   Diabetic neuropathy (HCC)    Diarrhea    attributed to diabetic meds   Endometrioid adenocarcinoma    GAD (generalized anxiety disorder)    GERD (gastroesophageal reflux disease)    Glaucoma    Hyperlipidemia    not on medications   Iron deficiency    Nephrolithiasis 2000   Neuropathy    both feet   Osteoporosis    Shingles 2004   Squamous cell carcinoma of skin 03/20/2020   dorsum of nose   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      aspirin 81 MG tablet Take 81 mg by mouth every evening.      bifidobacterium infantis (ALIGN) capsule Take 1 capsule by mouth daily.     calcium citrate-vitamin D (CITRACAL+D) 315-200 MG-UNIT tablet Take 1 tablet by mouth 2 (two) times daily.     Cholecalciferol (VITAMIN D3) 2000 UNITS TABS Take 1 capsule by mouth daily.      DEXILANT 60 MG capsule Take 1 capsule (60 mg total) by mouth daily. 90 capsule 1   Dulaglutide (TRULICITY) 3 MJE/5.6DJSOPN Inject 3 mg as directed once a week. 6 mL 1   FARXIGA 10 MG TABS tablet TAKE (1) TABLET DAILY IN THE MORNING. 90 tablet 1   gabapentin (NEURONTIN) 100 MG  capsule Take 1 capsule (100 mg total) by mouth daily. 90 capsule 1   glipiZIDE (GLIPIZIDE XL) 10 MG 24 hr tablet Take 1 tablet (10 mg total) by mouth daily with breakfast. 90 tablet 1   ibuprofen (ADVIL,MOTRIN) 800 MG tablet Take 1 tablet (800 mg total) by mouth every 6 (six) hours as needed for pain. 30 tablet 0   latanoprost (XALATAN) 0.005 % ophthalmic solution Place 1 drop into both eyes at bedtime.      LORazepam (ATIVAN) 0.5 MG tablet TAKE 1 TABLET TWICE DAILY AS NEEDED FOR ANXIETY OR SLEEP 60 tablet 1   Multiple Vitamins-Minerals (PRESERVISION/LUTEIN PO) Take by mouth.     ONETOUCH ULTRA test strip TEST BLOOD SUGAR ONCE DAILYAND AS NEEDED Dx E11.9 100 strip 3   SSD 1 % cream      vitamin B-12 (CYANOCOBALAMIN) 1000 MCG tablet Take 1,000 mcg by mouth daily.       No current facility-administered medications on file prior to visit.    Allergies  Allergen Reactions   Jardiance [Empagliflozin] Other (See Comments)    [place]   Lipitor [Atorvastatin]     fatique and myalgia   Penicillins Rash   Lipid Panel  Component Value Date/Time   CHOL 189 03/26/2021 0833   CHOL 151 03/21/2013 0830   TRIG 159 (H) 03/26/2021 0833   TRIG 266 (H) 01/31/2015 0903   TRIG 127 03/21/2013 0830   HDL 47 03/26/2021 0833   HDL 36 (L) 01/31/2015 0903   HDL 43 03/21/2013 0830   CHOLHDL 4.0 03/26/2021 0833   LDLCALC 114 (H) 03/26/2021 0833   LDLCALC 106 (H) 07/20/2014 0916   LDLCALC 83 03/21/2013 0830   LABVLDL 28 03/26/2021 0833   liver function tests Hepatic Function Latest Ref Rng & Units 03/26/2021 12/18/2020 09/11/2020  Total Protein 6.0 - 8.5 g/dL 6.7 6.8 6.9  Albumin 3.7 - 4.7 g/dL 4.3 4.2 4.1  AST 0 - 40 IU/L _0 ALT 0 - 32 IU/L _1 Alk Phosphatase 44 - 121 IU/L 98 107 102  Total Bilirubin 0.0 - 1.2 mg/dL 0.5 0.4 0.5   Assessment/Plan:   1. Hyperlipidemia -  Patient will to retry statin--discussed rosuvastatin 84m 3 times weekly. Patient in agreement with plan.  Labs  reviewed. This will help keep cost down while achieving our goals. Instructed patient to call if myopathy arises.  We can work to increase weekly dosing as tolerated and as warranted.  Follow up schedule with PCP in September.  JRegina Eck PharmD, BCPS Clinical Pharmacist, [place] [facility] Phone [phone]

## 2021-04-25 ENCOUNTER — Telehealth: Payer: Self-pay

## 2021-05-07 ENCOUNTER — Ambulatory Visit (INDEPENDENT_AMBULATORY_CARE_PROVIDER_SITE_OTHER): Payer: Medicare Other | Admitting: Pharmacist

## 2021-05-07 DIAGNOSIS — E782 Mixed hyperlipidemia: Secondary | ICD-10-CM

## 2021-05-13 ENCOUNTER — Other Ambulatory Visit: Payer: Self-pay

## 2021-05-13 ENCOUNTER — Ambulatory Visit (INDEPENDENT_AMBULATORY_CARE_PROVIDER_SITE_OTHER): Payer: Medicare Other | Admitting: Dermatology

## 2021-05-13 DIAGNOSIS — Z85828 Personal history of other malignant neoplasm of skin: Secondary | ICD-10-CM | POA: Diagnosis not present

## 2021-05-13 DIAGNOSIS — L738 Other specified follicular disorders: Secondary | ICD-10-CM

## 2021-05-13 DIAGNOSIS — D1801 Hemangioma of skin and subcutaneous tissue: Secondary | ICD-10-CM

## 2021-05-13 DIAGNOSIS — L814 Other melanin hyperpigmentation: Secondary | ICD-10-CM | POA: Diagnosis not present

## 2021-05-13 DIAGNOSIS — L720 Epidermal cyst: Secondary | ICD-10-CM | POA: Diagnosis not present

## 2021-05-13 DIAGNOSIS — Z1283 Encounter for screening for malignant neoplasm of skin: Secondary | ICD-10-CM

## 2021-05-14 NOTE — Patient Instructions (Signed)
Visit Information  PATIENT GOALS:  Goals Addressed               This Visit's Progress     Patient Stated     HLD (pt-stated)        Current Barriers:  Unable to achieve control of HLD  Suboptimal therapeutic regimen for HLD  Pharmacist Clinical Goal(s):  Over the next 90 days, patient will achieve control of HLD as evidenced by LIPID PROFILE IMPROVEMENT adhere to prescribed medication regimen as evidenced by LIPID PROFILE IMPROVEMENT through collaboration with PharmD and provider.    Interventions: 1:1 collaboration with Chevis Pretty, FNP regarding development and update of comprehensive plan of care as evidenced by provider attestation and co-signature Inter-disciplinary care team collaboration (see longitudinal plan of care) Comprehensive medication review performed; medication list updated in electronic medical record  Hyperlipidemia: Uncontrolled; current treatment; recently transitioned patient to 3x weekly statin--rosuvastatin '10mg'$  3x times weekly. 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   Telephone follow up appointment with care management team member scheduled for: 3 MONTHS  Signature Regina Eck, PharmD, BCPS Clinical Pharmacist, [place]  [facility] Phone [phone]

## 2021-05-14 NOTE — Progress Notes (Signed)
Chronic Care Management Pharmacy Note  05/07/2021 Name:  Catherine Cooper MRN:  [id number] DOB:  1941/09/03  Summary:  hyperlipidemia  Recommendations/Changes made from today's visit: Hyperlipidemia: Uncontrolled; current treatment; recently transitioned patient to 3x weekly statin--rosuvastatin 77m 3x times weekly. 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 year old female who is a primary patient of MChevis Pretty FHessmer  The CCM team was consulted for assistance with disease management and care coordination needs.    Engaged with patient by telephone for follow up visit in response to provider referral for pharmacy case management and/or care coordination services.   Consent to Services:  The patient was given information about Chronic Care Management services, agreed to services, and gave verbal consent prior to initiation of services.  Please see initial visit note for detailed documentation.   Patient Care Team: MChevis Pretty FNP as PCP - General (Nurse Practitioner) DSteffanie Rainwater DPM as Consulting Physician (Podiatry) RZadie RhineGClent Demark MD as Consulting Physician (Ophthalmology) HMonna Fam MD as Consulting Physician (Ophthalmology) HTeena Irani MD (Inactive) as Consulting Physician (Gastroenterology) DNewt Minion MD as Consulting Physician (Orthopedic Surgery) HIlean China RN as Registered Nurse TLavonna Monarch MD as Consulting Physician (Dermatology) [facility]ise [facility] (Pharmacist)  Objective:  Lab Results  Component Value Date   CREATININE 1.05 (H) 03/29/2021   CREATININE 0.92 03/26/2021   CREATININE 0.93 12/18/2020    Lab Results  Component Value Date   HGBA1C 7.4 (H) 03/26/2021   Last diabetic Eye exam:  Lab Results  Component Value Date/Time   HMDIABEYEEXA Retinopathy (A) 11/01/2020 12:00 AM    Last diabetic Foot exam: No results found for: HMDIABFOOTEX      Component Value Date/Time   CHOL 189 03/26/2021 0833   CHOL 151 03/21/2013 0830   TRIG 159 (H) 03/26/2021 0833   TRIG 266 (H) 01/31/2015 0903   TRIG 127 03/21/2013 0830   HDL 47 03/26/2021 0833   HDL 36 (L) 01/31/2015 0903   HDL 43 03/21/2013 0830   CHOLHDL 4.0 03/26/2021 0833   LDLCALC 114 (H) 03/26/2021 0833   LDLCALC 106 (H) 07/20/2014 0916   LDLCALC 83 03/21/2013 0830    Hepatic Function Latest Ref Rng & Units 03/26/2021 12/18/2020 09/11/2020  Total Protein 6.0 - 8.5 g/dL 6.7 6.8 6.9  Albumin 3.7 - 4.7 g/dL 4.3 4.2 4.1  AST 0 - 40 IU/L _0 ALT 0 - 32 IU/L _1 Alk Phosphatase 44 - 121 IU/L 98 107 102  Total Bilirubin 0.0 - 1.2 mg/dL 0.5 0.4 0.5    No results found for: TSH, FREET4  CBC Latest Ref Rng & Units 03/26/2021 12/18/2020 09/11/2020  WBC 3.4 - 10.8 x10E3/uL 6.1 6.0 9.4  Hemoglobin 11.1 - 15.9 g/dL 14.2 14.3 13.9  Hematocrit 34.0 - 46.6 % 43.6 42.0 40.5  Platelets 150 - 450 x10E3/uL 164 158 164    No results found for: VD25OH  Clinical ASCVD: No  The ASCVD Risk score (Mikey BussingDC Jr., et al., 2013) failed to calculate for the following reasons:   The 2013  ASCVD risk score is only valid for ages 81 to 46    Other: (CHADS2VASc if Afib, PHQ9 if depression, MMRC or CAT for COPD, ACT, DEXA)  Social History   Tobacco Use  Smoking Status Never  Smokeless Tobacco Never   BP Readings from Last 3 Encounters:  03/26/21 136/86  12/18/20 140/82  09/11/20 119/64   Pulse Readings from Last 3 Encounters:  03/26/21 89  12/18/20 99  09/11/20 87   Wt  Readings from Last 3 Encounters:  03/26/21 157 lb (71.2 kg)  12/18/20 160 lb (72.6 kg)  09/11/20 162 lb 12.8 oz (73.8 kg)    Assessment: Review of patient past medical history, allergies, medications, health status, including review of consultants reports, laboratory and other test data, was performed as part of comprehensive evaluation and provision of chronic care management services.   SDOH:  (Social Determinants of Health) assessments and interventions performed:    CCM Care Plan  Allergies  Allergen Reactions   Jardiance [Empagliflozin] Other (See Comments)    Legs eak   Lipitor [Atorvastatin]     fatique and myalgia   Penicillins Rash    Medications Reviewed Today     Reviewed by Free, Carroll Sage, CMA (Certified Medical Assistant) on 05/13/21 at [id number]  Med List Status: <None>   Medication Order Taking? Sig Documenting Provider Last Dose Status Informant  ALPHAGAN P 0.1 % SOLN [id number] Yes Place 1 drop into both eyes 2 (two) times daily.  [provider] Taking Active Self           Med Note Hazel Sams Mar 24, 2016  4:29 PM)    aspirin 81 MG tablet [id number] Yes Take 81 mg by mouth every evening.  [provider] Taking Active Self  bifidobacterium infantis (ALIGN) capsule [id number] Yes Take 1 capsule by mouth daily. [provider] Taking Active Self  calcium citrate-vitamin D (CITRACAL+D) 315-200 MG-UNIT tablet [id number] Yes Take 1 tablet by mouth 2 (two) times daily. [provider] Taking Active   Cholecalciferol (VITAMIN D3) 2000 UNITS TABS [id number] Yes Take 1 capsule by mouth daily.  [provider] Taking Active Self  DEXILANT 60 MG capsule [id number] Yes Take 1 capsule (60 mg total) by mouth daily. Hassell Done Mary-Margaret, FNP Taking Active   Dulaglutide (TRULICITY) 3 PN/3.6RW SOPN [id number] Yes Inject 3 mg as directed once a week. Hassell Done, Mary-Margaret, FNP Taking Active   FARXIGA 10 MG TABS tablet [id number] Yes  TAKE (1) TABLET DAILY IN THE MORNING. Hassell Done, Mary-Margaret, FNP Taking Active   gabapentin (NEURONTIN) 100 MG capsule [id number] Yes Take 1 capsule (100 mg total) by mouth daily. Hassell Done, Mary-Margaret, FNP Taking Active   glipiZIDE (GLIPIZIDE XL) 10 MG 24 hr tablet [id number] Yes Take 1 tablet (10 mg total) by mouth daily with breakfast. Chevis Pretty, FNP Taking Active   ibuprofen (ADVIL,MOTRIN) 800 MG tablet [id number] Yes Take 1 tablet (800 mg total) by mouth every 6 (six) hours as needed for pain. Hassell Done, Mary-Margaret, FNP Taking Active Self  latanoprost (XALATAN) 0.005 % ophthalmic solution [id number] Yes Place 1 drop into both eyes at bedtime.  [provider] Taking Active Self           Med Note Hazel Sams Mar 24, 2016  4:31 PM)    LORazepam (ATIVAN) 0.5 MG tablet [id number] Yes TAKE 1 TABLET TWICE DAILY AS NEEDED FOR ANXIETY OR SLEEP Chevis Pretty, FNP Taking Active   Multiple Vitamins-Minerals (PRESERVISION/LUTEIN PO) [id number]  Yes Take by mouth. [provider] Taking Active   [facility] ULTRA test strip [id number] Yes TEST BLOOD SUGAR ONCE DAILYAND AS NEEDED Dx E11.9 Hassell Done Mary-Margaret, FNP Taking Active   rosuvastatin (CRESTOR) 10 MG tablet [id number] Yes Take 1 tablet (10 mg total) by mouth 3 (three) times a week. Chevis Pretty, FNP Taking Active   SSD 1 % cream [id number] Yes  [provider] Taking Active   vitamin B-12 (CYANOCOBALAMIN) 1000 MCG tablet [id number] Yes Take 1,000 mcg by mouth daily.   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/2020    Gastroesophageal reflux disease without esophagitis 12/12/2015   Chronic kidney disease, stage III (moderate) ([facility]) 12/12/2015   Diarrhea 06/04/2015   BMI 28.0-28.9,adult 05/09/2015   Hyperlipidemia 03/21/2013   Endometrial cancer ([place]) 12/04/2011   HTN (hypertension) 09/16/2011   Diabetes ([place]) 01/11/2011   Osteoporosis 01/11/2011   Glaucoma 01/11/2011   GAD (generalized anxiety disorder) 01/11/2011    Immunization History  Administered Date(s) Administered   Fluad Quad(high Dose 65+) 08/11/2019, 07/02/2020   Influenza Whole 08/12/2010   Influenza, High Dose Seasonal PF 08/21/2016, 07/14/2017, 07/12/2018   Influenza,inj,Quad PF,6+ Mos 07/04/2013, 07/20/2014, 07/18/2015   Moderna Sars-Covid-2 Vaccination 12/12/2019, 01/09/2020, 08/21/2020   Pneumococcal Conjugate-13 01/31/2015   Pneumococcal Polysaccharide-23 08/12/2010   Zoster Recombinat (Shingrix) 03/26/2021   Zoster, Live 04/12/2014    Conditions to be addressed/monitored: HLD  Care Plan : PHARMD MEDICATION MANAGEMENT  Updates made by Lavera Guise, [place] since 05/14/2021 12:00 AM     Problem: DISEASE PROGRESSION PREVENTION      Long-Range Goal: HYPERLIPIDEMIA   Note:   Current Barriers:  Unable to achieve control of HLD  Suboptimal therapeutic regimen for HLD  Pharmacist Clinical Goal(s):  Over the next 90 days, patient will achieve control of HLD as evidenced by LIPID PROFILE IMPROVEMENT adhere to prescribed medication regimen as evidenced by LIPID PROFILE IMPROVEMENT  through collaboration with PharmD and provider.    Interventions: 1:1 collaboration with Chevis Pretty, FNP regarding development and update of comprehensive plan of care as evidenced by provider attestation and co-signature Inter-disciplinary care team collaboration (see longitudinal plan of care) Comprehensive medication review performed; medication list updated in electronic medical record  Hyperlipidemia: Uncontrolled; current  treatment; recently transitioned patient to 3x weekly statin--rosuvastatin 41m 3x times weekly. 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  Patient affirms current coverage meets needs.  Patient's preferred pharmacy is:  [place] [address]Phone: [phone]: [address] [address]Phone: [phone]: [phone] [address] [place] [address] Phone: [phone] Fax: [phone]  [facility] [place] [facility] [address] [place] [address] Phone: [phone] Fax: [phone]   Follow Up:  Patient agrees to Care Plan and Follow-up.  Plan: Telephone follow up appointment with care management team member scheduled for:  3 months  Regina Eck, PharmD, BCPS Clinical Pharmacist, [place]  II Phone [phone]

## 2021-05-16 ENCOUNTER — Encounter (INDEPENDENT_AMBULATORY_CARE_PROVIDER_SITE_OTHER): Payer: Medicare Other | Admitting: Ophthalmology

## 2021-05-16 DIAGNOSIS — E1142 Type 2 diabetes mellitus with diabetic polyneuropathy: Secondary | ICD-10-CM | POA: Diagnosis not present

## 2021-05-16 DIAGNOSIS — M79676 Pain in unspecified toe(s): Secondary | ICD-10-CM | POA: Diagnosis not present

## 2021-05-16 DIAGNOSIS — L84 Corns and callosities: Secondary | ICD-10-CM | POA: Diagnosis not present

## 2021-05-16 DIAGNOSIS — B351 Tinea unguium: Secondary | ICD-10-CM | POA: Diagnosis not present

## 2021-05-29 ENCOUNTER — Ambulatory Visit (INDEPENDENT_AMBULATORY_CARE_PROVIDER_SITE_OTHER): Payer: Medicare Other

## 2021-05-29 VITALS — Ht 65.0 in | Wt 157.0 lb

## 2021-05-29 DIAGNOSIS — Z0000 Encounter for general adult medical examination without abnormal findings: Secondary | ICD-10-CM | POA: Diagnosis not present

## 2021-05-29 NOTE — Progress Notes (Signed)
Subjective:   Catherine Cooper is a 80 y.o. female who presents for Medicare Annual (Subsequent) preventive examination.  Virtual Visit via Telephone Note  I connected with  Catherine Cooper on 05/29/21 at  8:15 AM EDT by telephone and verified that I am speaking with the correct person using two identifiers.  Location: Patient: Home Provider: [facility] Persons participating in the virtual visit: patient/Nurse Health Advisor   I discussed the limitations, risks, security and privacy concerns of performing an evaluation and management service by telephone and the availability of in person appointments. The patient expressed understanding and agreed to proceed.  Interactive audio and video telecommunications were attempted between this nurse and patient, however failed, due to patient having technical difficulties OR patient did not have access to video capability.  We continued and completed visit with audio only.  Some vital signs may be absent or patient reported.   Benay Pomeroy E Tangee Marszalek, LPN   Review of Systems     Cardiac Risk Factors include: advanced age (>66mn, >>63women);diabetes mellitus;sedentary lifestyle;dyslipidemia;hypertension     Objective:    Today's Vitals   05/29/21 0810  Weight: 157 lb (71.2 kg)  Height: '5\' 5"'$  (1.651 m)   Body mass index is 26.13 kg/m.  Advanced Directives 05/29/2021 05/28/2020 05/19/2019 03/28/2016 07/18/2015 05/09/2015 02/14/2015  Does Patient Have a Medical Advance Directive? Yes No No No No No No  Type of AParamed[place]of [place] will - - - - - -  Copy of [place] Chart? No - copy requested - - - - - -  Would patient like information on creating a medical advance directive? - No - Patient declined No - Patient declined No - patient declined information No - patient declined information - No - patient declined information    Current Medications (verified) Outpatient Encounter Medications as of 05/29/2021   Medication Sig   ALPHAGAN P 0.1 % SOLN Place 1 drop into both eyes 2 (two) times daily.    aspirin 81 MG tablet Take 81 mg by mouth every evening.    bifidobacterium infantis (ALIGN) capsule Take 1 capsule by mouth daily.   calcium citrate-vitamin D (CITRACAL+D) 315-200 MG-UNIT tablet Take 1 tablet by mouth 2 (two) times daily.   Cholecalciferol (VITAMIN D3) 2000 UNITS TABS Take 1 capsule by mouth daily.    DEXILANT 60 MG capsule Take 1 capsule (60 mg total) by mouth daily.   Dulaglutide (TRULICITY) 3 M0000000SOPN Inject 3 mg as directed once a week.   FARXIGA 10 MG TABS tablet TAKE (1) TABLET DAILY IN THE MORNING.   gabapentin (NEURONTIN) 100 MG capsule Take 1 capsule (100 mg total) by mouth daily.   glipiZIDE (GLIPIZIDE XL) 10 MG 24 hr tablet Take 1 tablet (10 mg total) by mouth daily with breakfast.   ibuprofen (ADVIL,MOTRIN) 800 MG tablet Take 1 tablet (800 mg total) by mouth every 6 (six) hours as needed for pain.   latanoprost (XALATAN) 0.005 % ophthalmic solution Place 1 drop into both eyes at bedtime.    LORazepam (ATIVAN) 0.5 MG tablet TAKE 1 TABLET TWICE DAILY AS NEEDED FOR ANXIETY OR SLEEP   Multiple Vitamins-Minerals (PRESERVISION/LUTEIN PO) Take by mouth.   ONETOUCH ULTRA test strip TEST BLOOD SUGAR ONCE DAILYAND AS NEEDED Dx E11.9   rosuvastatin (CRESTOR) 10 MG tablet Take 1 tablet (10 mg total) by mouth 3 (three) times a week.   vitamin B-12 (CYANOCOBALAMIN) 1000 MCG tablet Take 1,000 mcg by mouth  daily.     SSD 1 % cream  (Patient not taking: Reported on 05/29/2021)   No facility-administered encounter medications on file as of 05/29/2021.    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 ([place])    left foot   Diabetes mellitus    type II   Diabetic  neuropathy (HCC)    Diarrhea    attributed to diabetic meds   Endometrioid adenocarcinoma    GAD (generalized anxiety disorder)    GERD (gastroesophageal reflux disease)    Glaucoma    Hyperlipidemia    not on medications   Iron deficiency    Nephrolithiasis 2000   Neuropathy    both feet   Osteoporosis    Shingles 2004   Squamous cell carcinoma of skin 03/20/2020   dorsum of nose   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   Right Upper Nose tx cx3 60f   Past Surgical History:  Procedure Laterality Date    vitrectomy  02/23/08   posterior; right eye   ABDOMINAL HYSTERECTOMY     ACHILLES TENDON SURGERY Left 03/28/2016   Procedure: Reconstruction Left Achilles;  Surgeon: MNewt Minion MD;  [place] [facility]  Service: [place]opedics;  Laterality: Left;   AMPUTATION Right 02/14/2015   Procedure: 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;  Surgeon: MNewt Minion MD;  [place] [facility]  Service: Orthopedics;  Laterality: Right;   COLONOSCOPY  2013   EYE SURGERY     FOOT SURGERY     Right foot / left heel   glaucome surgery right eye     skin cancer removed right side of nose     SKIN GRAFT Right 2007   foot   TUBAL LIGATION     Family History  Problem Relation Age of Onset   Dementia Father    Diabetes Father    Coronary artery disease Father 653  Hip fracture Father    Alzheimer's disease Father    Atrial fibrillation Mother    Osteoporosis Mother    Social History   Socioeconomic History   Marital status: Married    Spouse name: CJuanda Crumble  Number of children: 3   Years of education: 12   Highest education level: Some college, no degree  Occupational History   Occupation: retired    Comment: [employer]  Tobacco Use   Smoking status: Never   Smokeless tobacco: Never  Vaping Use   Vaping Use: Never used  Substance and Sexual Activity   Alcohol use: No   Drug use: No   Sexual activity: Not on file  Other Topics Concern   Not on file   Social History Narrative   2 healthy daughters and 1 son deceased (suicide @ 266   Daughter lives nearby plus 2 grandchildren [place]  Social Determinants of HRadio broadcast assistantStrain: Low Risk    Difficulty of Paying Living Expenses: Not hard at all  Food Insecurity: No Food Insecurity   Worried About RCharity fundraiserin the Last Year: Never true   RArboriculturistin the Last Year: Never true  Transportation Needs: No Transportation Needs   Lack of Transportation (Medical): No   Lack of Transportation (Non-Medical): No  Physical  Activity: Inactive   Days of Exercise per Week: 0 days   Minutes of Exercise per Session: 0 min  Stress: No Stress Concern Present   Feeling of Stress : Not at all  Social Connections: Socially Integrated   Frequency of Communication with Friends and Family: More than three times a week   Frequency of Social Gatherings with Friends and Family: Three times a week   Attends Religious Services: More than 4 times per year   Active Member of Clubs or Organizations: Yes   Attends Music therapist: More than 4 times per year   Marital Status: Married    Tobacco Counseling Counseling given: Not Answered   Clinical Intake:  Pre-visit preparation completed: Yes  Pain : No/denies pain     BMI - recorded: 26.13 Nutritional Status: BMI 25 -29 Overweight Nutritional Risks: None Diabetes: Yes CBG done?: No Did pt. bring in CBG monitor from home?: No  How often do you need to have someone help you when you read instructions, pamphlets, or other written materials from your doctor or pharmacy?: 1 - Never  Diabetic? Nutrition Risk Assessment:  Has the patient had any N/V/D within the last 2 months?  No  Does the patient have any non-healing wounds?  No  Has the patient had any unintentional weight loss or weight gain?  No   Diabetes:  Is the patient diabetic?  Yes  If diabetic, was a CBG obtained today?  No   Did the patient bring in their glucometer from home?  No  How often do you monitor your CBG's? Once daily fasting.   Financial Strains and Diabetes Management:  Are you having any financial strains with the device, your supplies or your medication? No .  Does the patient want to be seen by Chronic Care Management for management of their diabetes?  No  Would the patient like to be referred to a Nutritionist or for Diabetic Management?  No   Diabetic Exams:  Diabetic Eye Exam: Completed 11/01/2020. Overdue for diabetic eye exam. Pt has been advised about the importance in completing this exam.   Diabetic Foot Exam: Completed 09/11/2020. Pt has been advised about the importance in completing this exam. Pt is scheduled for diabetic foot exam on 06/19/2021.    Interpreter Needed?: No  Information entered by :: Catherine Fedora, LPN   Activities of Daily Living In your present state of health, do you have any difficulty performing the following activities: 05/29/2021  Hearing? N  Vision? N  Difficulty concentrating or making decisions? N  Walking or climbing stairs? N  Dressing or bathing? N  Doing errands, shopping? N  Preparing Food and eating ? N  Using the Toilet? N  In the past six months, have you accidently leaked urine? Y  Comment wears pads for protection  Do you have problems with loss of bowel control? N  Managing your Medications? N  Managing your Finances? N  Housekeeping or managing your Housekeeping? N  Some recent data might be hidden    Patient Care Team: Chevis Pretty, FNP as PCP - General (Nurse Practitioner) Steffanie Rainwater, DPM as Consulting Physician (Podiatry) Zadie Rhine Clent Demark, MD as Consulting Physician (Ophthalmology) Monna Fam, MD as Consulting Physician (Ophthalmology) Ilean China, RN as Registered Nurse Lavonna Monarch, MD as Consulting Physician (Dermatology) Lavera Guise, [facility] (Pharmacist)  Indicate any recent Medical Services you may have  received from other than [facility] providers in the past year (date may be approximate).  Assessment:   This is a routine wellness examination for Catherine Cooper.  Hearing/Vision screen Hearing Screening - Comments:: Denies hearing difficulties  Vision Screening - Comments:: Wears eyeglasses - up to date with annual eye exams with Dr Herbert [facility] and [facility]  Dietary issues and exercise activities discussed: Current Exercise Habits: The patient does not participate in regular exercise at present, Exercise limited by: None identified   Goals Addressed   None    Depression Screen PHQ 2/9 Scores 05/29/2021 03/26/2021 12/18/2020 09/11/2020 06/11/2020 05/28/2020 02/21/2020  PHQ - 2 Score 0 0 0 0 0 0 0  PHQ- 9 Score - 0 - - - - -    Fall Risk Fall Risk  05/29/2021 03/26/2021 12/18/2020 09/11/2020 06/11/2020  Falls in the past year? 0 0 0 0 0  Number falls in past yr: 0 - - - -  Injury with Fall? 0 - - - -  Risk for fall due to : Medication side effect - - - -  Follow up Falls prevention discussed - - - -    FALL RISK PREVENTION PERTAINING TO THE HOME:  Any stairs in or around the home? Yes  If so, are there any without handrails? No  Home free of loose throw rugs in walkways, pet beds, electrical cords, etc? Yes  Adequate lighting in your home to reduce risk of falls? Yes   ASSISTIVE DEVICES UTILIZED TO PREVENT FALLS:  Life alert? No  Use of a cane, walker or w/c? Yes  - prn if walking in yard Grab bars in the bathroom? No  Shower chair or bench in shower? Yes  Elevated toilet seat or a handicapped toilet? Yes   TIMED UP AND GO:  Was the test performed? No . Telephonic visit  Cognitive Function: Normal cognitive status assessed by direct observation by this Nurse Health Advisor. No abnormalities found.    MMSE - Mini Mental State Exam 02/16/2018 07/18/2015  Orientation to time 5 5  Orientation to Place 5 5  Registration 3 3  Attention/ Calculation 5 5  Recall 3 3  Language- name 2 objects 2 2   Language- repeat 1 1  Language- follow 3 step command 3 3  Language- read & follow direction 1 1  Write a sentence 1 1  Copy design 1 1  Total score 30 30     6CIT Screen 05/28/2020 05/19/2019  What Year? 0 points 0 points  What month? 0 points 0 points  What time? 0 points 0 points  Count back from 20 0 points 0 points  Months in reverse 0 points 0 points  Repeat phrase 0 points 2 points  Total Score 0 2    Immunizations Immunization History  Administered Date(s) Administered   Fluad Quad(high Dose 65+) 08/11/2019, 07/02/2020   Influenza Whole 08/12/2010   Influenza, High Dose Seasonal PF 08/21/2016, 07/14/2017, 07/12/2018   Influenza,inj,Quad PF,6+ Mos 07/04/2013, 07/20/2014, 07/18/2015   Moderna Sars-Covid-2 Vaccination 12/12/2019, 01/09/2020, 08/21/2020   Pneumococcal Conjugate-13 01/31/2015   Pneumococcal Polysaccharide-23 08/12/2010   Zoster Recombinat (Shingrix) 03/26/2021   Zoster, Live 04/12/2014    TDAP status: Due, Education has been provided regarding the importance of this vaccine. Advised may receive this vaccine at local pharmacy or Health Dept. Aware to provide a copy of the vaccination record if obtained from local pharmacy or Health Dept. Verbalized acceptance and understanding.  Flu Vaccine status: Up to date  Pneumococcal vaccine status: Up to date  Covid-19 vaccine status: Completed vaccines  Qualifies for Shingles Vaccine?  Yes   Zostavax completed Yes   Shingrix Completed?: No.    Education has been provided regarding the importance of this vaccine. Patient has been advised to call insurance company to determine out of pocket expense if they have not yet received this vaccine. Advised may also receive vaccine at local pharmacy or Health Dept. Verbalized acceptance and understanding.  Screening Tests Health Maintenance  Topic Date Due   COVID-19 Vaccine (4 - Booster for Moderna series) 11/21/2020   TETANUS/TDAP  02/10/2021   Zoster Vaccines-  Shingrix (2 of 2) 05/21/2021   INFLUENZA VACCINE  05/13/2021   MAMMOGRAM  08/21/2021   FOOT EXAM  09/11/2021   HEMOGLOBIN A1C  09/25/2021   OPHTHALMOLOGY EXAM  11/01/2021   URINE MICROALBUMIN  03/26/2022   DEXA SCAN  09/12/2022   PNA vac Low Risk Adult  Completed   HPV VACCINES  Aged Out    Health Maintenance  Health Maintenance Due  Topic Date Due   COVID-19 Vaccine (4 - Booster for Moderna series) 11/21/2020   TETANUS/TDAP  02/10/2021   Zoster Vaccines- Shingrix (2 of 2) 05/21/2021   INFLUENZA VACCINE  05/13/2021    Colorectal cancer screening: No longer required.   Mammogram status: Completed 08/21/2020. Repeat every year  Bone Density status: Completed 09/12/2020. Results reflect: Bone density results: OSTEOPOROSIS. Repeat every 2 years.  Lung Cancer Screening: (Low Dose CT Chest recommended if Age 7-80 years, 30 pack-year currently smoking OR have quit w/in 15years.) does not qualify.   Additional Screening:  Hepatitis C Screening: does not qualify  Vision Screening: Recommended annual ophthalmology exams for early detection of glaucoma and other disorders of the eye. Is the patient up to date with their annual eye exam?  Yes  Who is the provider or what is the name of the office in which the patient attends annual eye exams? [facility] If pt is not established with a provider, would they like to be referred to a provider to establish care? No .   Dental Screening: Recommended annual dental exams for proper oral hygiene  Community Resource Referral / Chronic Care Management: CRR required this visit?  No   CCM required this visit?  No      Plan:     I have personally reviewed and noted the following in the patient's chart:   Medical and social history Use of alcohol, tobacco or illicit drugs  Current medications and supplements including opioid prescriptions.  Functional ability and status Nutritional status Physical activity Advanced  directives List of other physicians Hospitalizations, surgeries, and ER visits in previous 12 months Vitals Screenings to include cognitive, depression, and falls Referrals and appointments  In addition, I have reviewed and discussed with patient certain preventive protocols, quality metrics, and best practice recommendations. A written personalized care plan for preventive services as well as general preventive health recommendations were provided to patient.     Sandrea Hammond, LPN   [id number]   Nurse Notes: None

## 2021-05-29 NOTE — Patient Instructions (Signed)
Catherine Cooper , Thank you for taking time to come for your Medicare Wellness Visit. I appreciate your ongoing commitment to your health goals. Please review the following plan we discussed and let me know if I can assist you in the future.   Screening recommendations/referrals: Colonoscopy: Done 08/02/2012 - No repeat required Mammogram: Done 08/21/2020 - Repeat annually Bone Density: Done 09/12/2020 - Repeat every 2 years  Recommended yearly ophthalmology/optometry visit for glaucoma screening and checkup Recommended yearly dental visit for hygiene and checkup  Vaccinations: Influenza vaccine: Done 07/02/2020 - Repeat annually Pneumococcal vaccine: Done 08/12/2010 & 01/31/2015 Tdap vaccine: Done 02/11/2011 - Repeat in 10 years *due Shingles vaccine: First dose done 03/26/2021, due for second dose   Covid-19: Done 12/12/19, 01/09/20, & 08/21/2020  Advanced directives: Please bring a copy of your health care power of attorney and living will to the office to be added to your chart at your convenience.   Conditions/risks identified: Aim for 30 minutes of exercise or brisk walking each day, drink 6-8 glasses of water and eat lots of fruits and vegetables.   Next appointment: Follow up in one year for your annual wellness visit    Preventive Care 81 Years and Older, Female Preventive care refers to lifestyle choices and visits with your health care provider that can promote health and wellness. What does preventive care include? A yearly physical exam. This is also called an annual well check. Dental exams once or twice a year. Routine eye exams. Ask your health care provider how often you should have your eyes checked. Personal lifestyle choices, including: Daily care of your teeth and gums. Regular physical activity. Eating a healthy diet. Avoiding tobacco and drug use. Limiting alcohol use. Practicing safe sex. Taking low-dose aspirin every day. Taking vitamin and mineral supplements as  recommended by your health care provider. What happens during an annual well check? The services and screenings done by your health care provider during your annual well check will depend on your age, overall health, lifestyle risk factors, and family history of disease. Counseling  Your health care provider may ask you questions about your: Alcohol use. Tobacco use. Drug use. Emotional well-being. Home and relationship well-being. Sexual activity. Eating habits. History of falls. Memory and ability to understand (cognition). Work and work Statistician. Reproductive health. Screening  You may have the following tests or measurements: Height, weight, and BMI. Blood pressure. Lipid and cholesterol levels. These may be checked every 5 years, or more frequently if you are over 20 years old. Skin check. Lung cancer screening. You may have this screening every year starting at age 81 if you have a 30-pack-year history of smoking and currently smoke or have quit within the past 15 years. Fecal occult blood test (FOBT) of the stool. You may have this test every year starting at age 81. Flexible sigmoidoscopy or colonoscopy. You may have a sigmoidoscopy every 5 years or a colonoscopy every 10 years starting at age 81. Hepatitis C blood test. Hepatitis B blood test. Sexually transmitted disease (STD) testing. Diabetes screening. This is done by checking your blood sugar (glucose) after you have not eaten for a while (fasting). You may have this done every 1-3 years. Bone density scan. This is done to screen for osteoporosis. You may have this done starting at age 81. Mammogram. This may be done every 1-2 years. Talk to your health care provider about how often you should have regular mammograms. Talk with your health care provider about your test  results, treatment options, and if necessary, the need for more tests. Vaccines  Your health care provider may recommend certain vaccines, such  as: Influenza vaccine. This is recommended every year. Tetanus, diphtheria, and acellular pertussis (Tdap, Td) vaccine. You may need a Td booster every 10 years. Zoster vaccine. You may need this after age 81. Pneumococcal 13-valent conjugate (PCV13) vaccine. One dose is recommended after age 81. Pneumococcal polysaccharide (PPSV23) vaccine. One dose is recommended after age 81. Talk to your health care provider about which screenings and vaccines you need and how often you need them. This information is not intended to replace advice given to you by your health care provider. Make sure you discuss any questions you have with your health care provider. Document Released: 10/26/2015 Document Revised: 06/18/2016 Document Reviewed: 07/31/2015 Elsevier Interactive Patient Education  2017 [place] Prevention in the Home Falls can cause injuries. They can happen to people of all ages. There are many things you can do to make your home safe and to help prevent falls. What can I do on the outside of my home? Regularly fix the edges of walkways and driveways and fix any cracks. Remove anything that might make you trip as you walk through a door, such as a raised step or threshold. Trim any bushes or trees on the path to your home. Use bright outdoor lighting. Clear any walking paths of anything that might make someone trip, such as rocks or tools. Regularly check to see if handrails are loose or broken. Make sure that both sides of any steps have handrails. Any raised decks and porches should have guardrails on the edges. Have any leaves, snow, or ice cleared regularly. Use sand or salt on walking paths during winter. Clean up any spills in your garage right away. This includes oil or grease spills. What can I do in the bathroom? Use night lights. Install grab bars by the toilet and in the tub and shower. Do not use towel bars as grab bars. Use non-skid mats or decals in the tub or  shower. If you need to sit down in the shower, use a plastic, non-slip stool. Keep the floor dry. Clean up any water that spills on the floor as soon as it happens. Remove soap buildup in the tub or shower regularly. Attach bath mats securely with double-sided non-slip rug tape. Do not have throw rugs and other things on the floor that can make you trip. What can I do in the bedroom? Use night lights. Make sure that you have a light by your bed that is easy to reach. Do not use any sheets or blankets that are too big for your bed. They should not hang down onto the floor. Have a firm chair that has side arms. You can use this for support while you get dressed. Do not have throw rugs and other things on the floor that can make you trip. What can I do in the kitchen? Clean up any spills right away. Avoid walking on wet floors. Keep items that you use a lot in easy-to-reach places. If you need to reach something above you, use a strong step stool that has a grab bar. Keep electrical cords out of the way. Do not use floor polish or wax that makes floors slippery. If you must use wax, use non-skid floor wax. Do not have throw rugs and other things on the floor that can make you trip. What can I do with my stairs?  Do not leave any items on the stairs. Make sure that there are handrails on both sides of the stairs and use them. Fix handrails that are broken or loose. Make sure that handrails are as long as the stairways. Check any carpeting to make sure that it is firmly attached to the stairs. Fix any carpet that is loose or worn. Avoid having throw rugs at the top or bottom of the stairs. If you do have throw rugs, attach them to the floor with carpet tape. Make sure that you have a light switch at the top of the stairs and the bottom of the stairs. If you do not have them, ask someone to add them for you. What else can I do to help prevent falls? Wear shoes that: Do not have high heels. Have  rubber bottoms. Are comfortable and fit you well. Are closed at the toe. Do not wear sandals. If you use a stepladder: Make sure that it is fully opened. Do not climb a closed stepladder. Make sure that both sides of the stepladder are locked into place. Ask someone to hold it for you, if possible. Clearly mark and make sure that you can see: Any grab bars or handrails. First and last steps. Where the edge of each step is. Use tools that help you move around (mobility aids) if they are needed. These include: Canes. Walkers. Scooters. Crutches. Turn on the lights when you go into a dark area. Replace any light bulbs as soon as they burn out. Set up your furniture so you have a clear path. Avoid moving your furniture around. If any of your floors are uneven, fix them. If there are any pets around you, be aware of where they are. Review your medicines with your doctor. Some medicines can make you feel dizzy. This can increase your chance of falling. Ask your doctor what other things that you can do to help prevent falls. This information is not intended to replace advice given to you by your health care provider. Make sure you discuss any questions you have with your health care provider. Document Released: 07/26/2009 Document Revised: 03/06/2016 Document Reviewed: 11/03/2014 Elsevier Interactive Patient Education  2017 [employer].

## 2021-05-31 ENCOUNTER — Encounter: Payer: Self-pay | Admitting: Dermatology

## 2021-05-31 NOTE — Progress Notes (Signed)
   Follow-Up Visit   Subjective  Catherine Cooper is a 80 y.o. female who presents for the following: Annual Exam (Here for skin exam. Concerns Nose where the mohs was done on is a little sore. History of non mole skin cancers. ).  Normal skin examination, several spots to check.  Some soreness near the Mohs site nose.  Location:  Duration:  Quality:  Associated Signs/Symptoms: Modifying Factors:  Severity:  Timing: Context:   Objective  Well appearing patient in no apparent distress; mood and affect are within normal limits. Right Nasal Sidewall Half millimeter hard dermal papule  Dorsum of Nose Two millimeter cream-colored delled papule  Right Melolabial Fold Monochrome 3 mm brown macule; dermoscopy shows no atypia  Mid Back Red 1 to 2 mm smooth dermal papules  Mid Back Full body skin exam. No atypical moles or non mole skin cancers.   Right Tip of Nose No sign of recurrence abdomen Mohs site; uncertain why there is episodic pain.      A full examination was performed including scalp, head, eyes, ears, nose, lips, neck, chest, axillae, abdomen, back, buttocks, bilateral upper extremities, bilateral lower extremities, hands, feet, fingers, toes, fingernails, and toenails. All findings within normal limits unless otherwise noted below.  Areas beneath undergarments not fully examined.   Assessment & Plan    Milia Right Nasal Sidewall  Removal technique reviewed, no intervention initiated.  Sebaceous gland hyperplasia Dorsum of Nose  Benign no treatment needed.  Did discuss similar appearance of early [place] so if there is growth or bleeding she will return for biopsy.  In  [place] Right Melolabial Fold  Leave if stable  Hemangioma of skin Mid Back  No intervention necessary  Screening for malignant neoplasm of skin Mid Back  Yearly skin exam.  Personal history of skin cancer Right Tip of Nose      I, Lavonna Monarch, MD, have reviewed all  documentation for this visit.  The documentation on 05/31/21 for the exam, diagnosis, procedures, and orders are all accurate and complete.

## 2021-06-06 ENCOUNTER — Other Ambulatory Visit: Payer: Self-pay | Admitting: Nurse Practitioner

## 2021-06-06 DIAGNOSIS — E1142 Type 2 diabetes mellitus with diabetic polyneuropathy: Secondary | ICD-10-CM

## 2021-06-13 ENCOUNTER — Ambulatory Visit (INDEPENDENT_AMBULATORY_CARE_PROVIDER_SITE_OTHER): Payer: Medicare Other | Admitting: Ophthalmology

## 2021-06-13 ENCOUNTER — Encounter (INDEPENDENT_AMBULATORY_CARE_PROVIDER_SITE_OTHER): Payer: Self-pay | Admitting: Ophthalmology

## 2021-06-13 ENCOUNTER — Other Ambulatory Visit: Payer: Self-pay

## 2021-06-13 DIAGNOSIS — E113412 Type 2 diabetes mellitus with severe nonproliferative diabetic retinopathy with macular edema, left eye: Secondary | ICD-10-CM | POA: Diagnosis not present

## 2021-06-13 DIAGNOSIS — H353122 Nonexudative age-related macular degeneration, left eye, intermediate dry stage: Secondary | ICD-10-CM | POA: Diagnosis not present

## 2021-06-13 DIAGNOSIS — Z794 Long term (current) use of insulin: Secondary | ICD-10-CM | POA: Diagnosis not present

## 2021-06-13 DIAGNOSIS — E113551 Type 2 diabetes mellitus with stable proliferative diabetic retinopathy, right eye: Secondary | ICD-10-CM | POA: Diagnosis not present

## 2021-06-13 DIAGNOSIS — E113492 Type 2 diabetes mellitus with severe nonproliferative diabetic retinopathy without macular edema, left eye: Secondary | ICD-10-CM | POA: Diagnosis not present

## 2021-06-13 LAB — HM DIABETES EYE EXAM

## 2021-06-13 NOTE — Progress Notes (Signed)
06/13/2021     CHIEF COMPLAINT Patient presents for  Chief Complaint  Patient presents with   Retina Follow Up      HISTORY OF PRESENT ILLNESS: Catherine Cooper is a 80 y.o. female who presents to the clinic today for:   HPI     Retina Follow Up   Patient presents with  Diabetic Retinopathy.  In both eyes.  This started 9 months ago.  Severity is mild.  Duration of 9 months.  Since onset it is stable.        Comments   9 mos fu ou fp. Pt. states she does not think her left eye is as good as it was. Floaters and FOL stable.  LBS this morning: 158 A1C: 7.6 Pt is using alphagan OU bid, latanoprost ou qhs.       Last edited by Laurin Coder on 06/13/2021 10:02 AM.      Referring physician: Chevis Pretty, [address] [place],  [place] [id number]  HISTORICAL INFORMATION:   Selected notes from the MEDICAL RECORD NUMBER    Lab Results  Component Value Date   HGBA1C 7.4 (H) 03/26/2021     CURRENT MEDICATIONS: Current Outpatient Medications (Ophthalmic Drugs)  Medication Sig   ALPHAGAN P 0.1 % SOLN Place 1 drop into both eyes 2 (two) times daily.    latanoprost (XALATAN) 0.005 % ophthalmic solution Place 1 drop into both eyes at bedtime.    No current facility-administered medications for this visit. (Ophthalmic Drugs)   Current Outpatient Medications (Other)  Medication Sig   aspirin 81 MG tablet Take 81 mg by mouth every evening.    bifidobacterium infantis (ALIGN) capsule Take 1 capsule by mouth daily.   calcium citrate-vitamin D (CITRACAL+D) 315-200 MG-UNIT tablet Take 1 tablet by mouth 2 (two) times daily.   Cholecalciferol (VITAMIN D3) 2000 UNITS TABS Take 1 capsule by mouth daily.    DEXILANT 60 MG capsule Take 1 capsule (60 mg total) by mouth daily.   FARXIGA 10 MG TABS tablet TAKE (1) TABLET DAILY IN THE MORNING.   gabapentin (NEURONTIN) 100 MG capsule Take 1 capsule (100 mg total) by mouth daily.   glipiZIDE (GLIPIZIDE XL) 10 MG 24  hr tablet Take 1 tablet (10 mg total) by mouth daily with breakfast.   ibuprofen (ADVIL,MOTRIN) 800 MG tablet Take 1 tablet (800 mg total) by mouth every 6 (six) hours as needed for pain.   LORazepam (ATIVAN) 0.5 MG tablet TAKE 1 TABLET TWICE DAILY AS NEEDED FOR ANXIETY OR SLEEP   Multiple Vitamins-Minerals (PRESERVISION/LUTEIN PO) Take by mouth.   ONETOUCH ULTRA test strip TEST BLOOD SUGAR ONCE DAILYAND AS NEEDED Dx E11.9   rosuvastatin (CRESTOR) 10 MG tablet Take 1 tablet (10 mg total) by mouth 3 (three) times a week.   SSD 1 % cream  (Patient not taking: Reported on [id number])   TRULICITY 3 0000000 SOPN inject 3 mg as directed once a week   vitamin B-12 (CYANOCOBALAMIN) 1000 MCG tablet Take 1,000 mcg by mouth daily.     No current facility-administered medications for this visit. 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[place])    left foot   Diabetes mellitus    type II   Diabetic neuropathy (HCC)    Diarrhea    attributed to diabetic meds   Endometrioid adenocarcinoma    GAD (generalized anxiety disorder)    GERD (gastroesophageal reflux disease)    Glaucoma    Hyperlipidemia    not on medications   Iron deficiency    Nephrolithiasis 2000   Neuropathy    both feet   Osteoporosis    Shingles 2004   Squamous cell carcinoma of skin 03/20/2020   dorsum of nose   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   Right Upper Nose tx cx3 69f   Past Surgical History:  Procedure Laterality Date    vitrectomy  02/23/08   posterior; right eye   ABDOMINAL HYSTERECTOMY     ACHILLES TENDON SURGERY Left  03/28/2016   Procedure: Reconstruction Left Achilles;  Surgeon: MNewt Minion MD;  [facility]  Service: Orthopedics;  Laterality: Left;   AMPUTATION Right 02/14/2015   Procedure: 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;  Surgeon: MNewt Minion MD;  [facility]  Service: Orthopedics;  Laterality: Right;   COLONOSCOPY  2013   EYE SURGERY     FOOT SURGERY     Right foot / left heel   glaucome surgery right eye     skin cancer removed right side of nose     SKIN GRAFT Right 2007   foot   TUBAL LIGATION      FAMILY HISTORY Family History  Problem Relation Age of Onset   Dementia Father    Diabetes Father    Coronary artery disease Father 679  Hip fracture Father    Alzheimer's disease Father    Atrial fibrillation Mother    Osteoporosis Mother     SOCIAL HISTORY Social History   Tobacco Use   Smoking status: Never   Smokeless tobacco: Never  Vaping Use   Vaping Use: Never used  Substance Use Topics   Alcohol use: No   Drug use: No         OPHTHALMIC EXAM:  Base Eye Exam     Visual Acuity (ETDRS)       Right Left   Dist cc 20/50 20/30 -2   Dist ph cc NI NI    Correction: Glasses         Tonometry (Tonopen, 10:06 AM)       Right Left   Pressure 16 13         Pupils       Dark Light Shape React APD   Right 2 2 Round fixed None   Left 2 2 Irregular Minimal None         Visual Fields (Counting fingers)       Left Right    Full Full         Extraocular Movement       Right Left    Full Full         Neuro/Psych     Oriented x3: Yes   Mood/Affect: Normal         Dilation     Both eyes: 1.0% Mydriacyl, 2.5% Phenylephrine @ 10:06 AM           Slit Lamp and Fundus Exam     External Exam       Right Left  External Normal Normal         Slit Lamp Exam       Right Left   Lids/Lashes Normal Normal   Conjunctiva/Sclera White and quiet White and quiet   Cornea Clear Clear   Anterior Chamber Deep and quiet Deep and  quiet   Iris Round and reactive Round and reactive   Lens Posterior chamber intraocular lens Posterior chamber intraocular lens   Anterior Vitreous Normal Normal         Fundus Exam       Right Left   Posterior Vitreous Normal Normal   Disc Normal Normal   C/D Ratio 0.7 0.55   Macula no clinically significant macular edema, no hemorrhage, Microaneurysms no clinically significant macular edema, no hemorrhage, Microaneurysms   Vessels PDR-quiet NPDR-Severe   Periphery Good PRP mostly nasal superior and inferior Large stable chorioretinal scar nasally, likely prevents progression of retinopathy            IMAGING AND PROCEDURES  Imaging and Procedures for 06/13/21  Color Fundus Photography Optos - OU - Both Eyes       Right Eye Progression has been stable. Disc findings include normal observations.   Left Eye Progression has been stable. Disc findings include normal observations.   Notes Good nasal peripheral PRP, PDR quiescent.  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 Intermediate stage nonexudative age-related macular  degeneration of left eye  H35.3122     4. 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 Type 2 diabetes mellitus with left eye affected by severe nonproliferative retinopathy and macular edema, without long-term current use of insulin ([place])  [id number]       1.  Patient reports uncontrolled diabetic condition however retinopathy in the right eyes remained stable and quiet PDR with good PRP and OS with severe NPDR no signs of progression we will continue to monitor OU  2.  3.  Ophthalmic Meds Ordered this visit:  No orders of the defined types were placed in this encounter.      Return in about 1 year (around 06/13/2022) for DILATE OU, COLOR FP, OCT.  There are no Patient Instructions on file for this visit.   Explained the diagnoses, plan, and follow up with the patient and they expressed understanding.  Patient expressed understanding of the importance of proper follow up care.   Clent Demark Avayah Raffety M.D. Diseases & Surgery of the Retina and Vitreous [facility] [place] 06/13/21     Abbreviations: M myopia (nearsighted);  A astigmatism; H hyperopia (farsighted); P presbyopia; Mrx spectacle prescription;  CTL contact lenses; OD right eye; OS left eye; OU both eyes  XT exotropia; ET esotropia; PEK punctate epithelial keratitis; PEE punctate epithelial erosions; DES dry eye syndrome; MGD meibomian gland dysfunction; ATs artificial tears; PFAT's preservative free artificial tears; [place] nuclear sclerotic cataract; PSC posterior subcapsular cataract; ERM epi-retinal membrane; PVD posterior vitreous detachment; RD retinal detachment; DM diabetes mellitus; DR diabetic retinopathy; NPDR non-proliferative diabetic retinopathy; PDR proliferative diabetic retinopathy; CSME clinically significant macular edema; DME diabetic macular edema; dbh dot blot hemorrhages; CWS cotton wool spot; POAG primary  open angle glaucoma; C/D cup-to-disc ratio; HVF humphrey visual field; GVF goldmann visual field; OCT optical coherence tomography; IOP intraocular pressure; BRVO Branch retinal vein occlusion; CRVO central retinal vein occlusion; CRAO central retinal artery occlusion; BRAO branch retinal artery occlusion; RT retinal tear; SB scleral buckle; PPV pars plana vitrectomy; VH Vitreous hemorrhage; PRP panretinal laser photocoagulation; IVK intravitreal kenalog; VMT vitreomacular traction; MH Macular hole;  NVD neovascularization of the disc; NVE neovascularization elsewhere; AREDS age related eye disease study; ARMD age related macular degeneration; POAG primary open angle glaucoma; EBMD epithelial/anterior basement membrane dystrophy; ACIOL anterior chamber intraocular lens; IOL intraocular lens; PCIOL posterior chamber intraocular lens; Phaco/IOL phacoemulsification with intraocular lens placement; Lee photorefractive keratectomy; LASIK laser assisted in situ keratomileusis; HTN hypertension; DM diabetes mellitus; COPD chronic obstructive pulmonary disease

## 2021-06-13 NOTE — Assessment & Plan Note (Signed)
Stable severe NPDR no progression

## 2021-06-13 NOTE — Assessment & Plan Note (Signed)
No active disease 

## 2021-06-13 NOTE — Assessment & Plan Note (Signed)
Minor dry atrophy

## 2021-06-19 ENCOUNTER — Other Ambulatory Visit: Payer: Self-pay

## 2021-06-19 ENCOUNTER — Encounter: Payer: Self-pay | Admitting: Nurse Practitioner

## 2021-06-19 ENCOUNTER — Ambulatory Visit (INDEPENDENT_AMBULATORY_CARE_PROVIDER_SITE_OTHER): Payer: Medicare Other | Admitting: Nurse Practitioner

## 2021-06-19 VITALS — BP 136/79 | HR 83 | Temp 98.4°F | Resp 20 | Ht 65.0 in | Wt 154.0 lb

## 2021-06-19 DIAGNOSIS — M810 Age-related osteoporosis without current pathological fracture: Secondary | ICD-10-CM

## 2021-06-19 DIAGNOSIS — E782 Mixed hyperlipidemia: Secondary | ICD-10-CM | POA: Diagnosis not present

## 2021-06-19 DIAGNOSIS — Z23 Encounter for immunization: Secondary | ICD-10-CM | POA: Diagnosis not present

## 2021-06-19 DIAGNOSIS — K219 Gastro-esophageal reflux disease without esophagitis: Secondary | ICD-10-CM | POA: Diagnosis not present

## 2021-06-19 DIAGNOSIS — I10 Essential (primary) hypertension: Secondary | ICD-10-CM | POA: Diagnosis not present

## 2021-06-19 DIAGNOSIS — E1142 Type 2 diabetes mellitus with diabetic polyneuropathy: Secondary | ICD-10-CM | POA: Diagnosis not present

## 2021-06-19 DIAGNOSIS — N1831 Chronic kidney disease, stage 3a: Secondary | ICD-10-CM

## 2021-06-19 DIAGNOSIS — F411 Generalized anxiety disorder: Secondary | ICD-10-CM

## 2021-06-19 DIAGNOSIS — C541 Malignant neoplasm of endometrium: Secondary | ICD-10-CM

## 2021-06-19 DIAGNOSIS — Z6828 Body mass index (BMI) 28.0-28.9, adult: Secondary | ICD-10-CM

## 2021-06-19 LAB — BAYER DCA HB A1C WAIVED: HB A1C (BAYER DCA - WAIVED): 7.6 % — ABNORMAL HIGH (ref 4.8–5.6)

## 2021-06-19 MED ORDER — FARXIGA 10 MG PO TABS: ORAL_TABLET | ORAL | 1 refills | Status: DC

## 2021-06-19 MED ORDER — LORAZEPAM 0.5 MG PO TABS: ORAL_TABLET | ORAL | 1 refills | Status: DC

## 2021-06-19 MED ORDER — DEXILANT 60 MG PO CPDR: 1.0000 | DELAYED_RELEASE_CAPSULE | Freq: Every day | ORAL | 1 refills | Status: DC

## 2021-06-19 MED ORDER — ROSUVASTATIN CALCIUM 10 MG PO TABS: 10.0000 mg | ORAL_TABLET | ORAL | 1 refills | Status: DC

## 2021-06-19 MED ORDER — GABAPENTIN 100 MG PO CAPS: 100.0000 mg | ORAL_CAPSULE | Freq: Every day | ORAL | 1 refills | Status: DC

## 2021-06-19 MED ORDER — GLIPIZIDE ER 10 MG PO TB24: 10.0000 mg | ORAL_TABLET | Freq: Every day | ORAL | 1 refills | Status: DC

## 2021-06-19 NOTE — Addendum Note (Signed)
Addended by: Rolena Infante on: 06/19/2021 11:43 AM   Modules accepted: Orders

## 2021-06-19 NOTE — Patient Instructions (Signed)

## 2021-06-19 NOTE — Progress Notes (Signed)
Subjective:    Patient ID: Catherine Cooper, female    DOB: January 03, 1941, 80 y.o.   MRN: [id number]   Chief Complaint: medical management of chronic issues     HPI:  1. Primary hypertension No c/o chest pain, sob or headache. Does not check blood pressure at home. BP Readings from Last 3 Encounters:  03/26/21 136/86  12/18/20 140/82  09/11/20 119/64    2. Mixed hyperlipidemia Does tryy to [employer] but does no dedicated exercise. She was recently put on crestor by clinical pharmacist. She has refused that in the past. Lab Results  Component Value Date   CHOL 189 03/26/2021   HDL 47 03/26/2021   LDLCALC 114 (H) 03/26/2021   TRIG 159 (H) 03/26/2021   CHOLHDL 4.0 03/26/2021     3. Type 2 diabetes mellitus with diabetic polyneuropathy, without long-term current use of insulin (HCC) Fasting blood sugars are running around 130-190. She has had several episodes of over 250. She denies any low blood sugars. Lab Results  Component Value Date   HGBA1C 7.4 (H) 03/26/2021    4. Stage 3a chronic kidney disease (HCC) Lab Results  Component Value Date   CREATININE 1.05 (H) 03/29/2021     5. GAD (generalized anxiety disorder) Has ativan available for use. Does not take every day. Takes about 2-3 x a week. GAD 7 : Generalized Anxiety Score 06/19/2021 03/26/2021 12/18/2020 09/11/2020  Nervous, Anxious, on Edge 0 0 1 1  Control/stop worrying 0 0 1 0  Worry too much - different things 0 0 1 0  Trouble relaxing 0 0 0 0  Restless 0 0 0 0  Easily annoyed or irritable 0 0 0 0  Afraid - awful might happen 0 0 0 0  Total GAD 7 Score 0 0 3 1  Anxiety Difficulty Not difficult at all Not difficult at all Not difficult at all Not difficult at all      6. Age-related osteoporosis without current pathological fracture Last dexascan was done 09/11/20 with tscore of -2.7. she takes daily vitamin d and calcium suplement.  7. Endometrial cancer ([place]) Was in 2013. Had hysterectomy- needed no  further treatment. Has been released from oncology for many years.  8. BMI 26.0-26.9,adult Weight is down 3 lbs Wt Readings from Last 3 Encounters:  06/19/21 154 lb (69.9 kg)  05/29/21 157 lb (71.2 kg)  03/26/21 157 lb (71.2 kg)   BMI Readings from Last 3 Encounters:  06/19/21 25.63 kg/m  05/29/21 26.13 kg/m  03/26/21 26.13 kg/m       Outpatient Encounter Medications as of 06/19/2021  Medication Sig   ALPHAGAN P 0.1 % SOLN Place 1 drop into both eyes 2 (two) times daily.    aspirin 81 MG tablet Take 81 mg by mouth every evening.    bifidobacterium infantis (ALIGN) capsule Take 1 capsule by mouth daily.   calcium citrate-vitamin D (CITRACAL+D) 315-200 MG-UNIT tablet Take 1 tablet by mouth 2 (two) times daily.   Cholecalciferol (VITAMIN D3) 2000 UNITS TABS Take 1 capsule by mouth daily.    DEXILANT 60 MG capsule Take 1 capsule (60 mg total) by mouth daily.   FARXIGA 10 MG TABS tablet TAKE (1) TABLET DAILY IN THE MORNING.   gabapentin (NEURONTIN) 100 MG capsule Take 1 capsule (100 mg total) by mouth daily.   glipiZIDE (GLIPIZIDE XL) 10 MG 24 hr tablet Take 1 tablet (10 mg total) by mouth daily with breakfast.   ibuprofen (ADVIL,MOTRIN) 800 MG tablet  Take 1 tablet (800 mg total) by mouth every 6 (six) hours as needed for pain.   latanoprost (XALATAN) 0.005 % ophthalmic solution Place 1 drop into both eyes at bedtime.    LORazepam (ATIVAN) 0.5 MG tablet TAKE 1 TABLET TWICE DAILY AS NEEDED FOR ANXIETY OR SLEEP   Multiple Vitamins-Minerals (PRESERVISION/LUTEIN PO) Take by mouth.   ONETOUCH ULTRA test strip TEST BLOOD SUGAR ONCE DAILYAND AS NEEDED Dx E11.9   rosuvastatin (CRESTOR) 10 MG tablet Take 1 tablet (10 mg total) by mouth 3 (three) times a week.   SSD 1 % cream  (Patient not taking: Reported on 7/51/7001)   TRULICITY 3 VC/9.4WH SOPN inject 3 mg as directed once a week   vitamin B-12 (CYANOCOBALAMIN) 1000 MCG tablet Take 1,000 mcg by mouth daily.     No facility-administered  encounter medications on file as of 06/19/2021.    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;  Surgeon: Newt Minion, MD;  Location: [place];  Service: Orthopedics;  Laterality: Right;   COLONOSCOPY  2013   EYE SURGERY     FOOT SURGERY     Right foot / left heel   glaucome surgery right eye     skin cancer removed right side of nose     SKIN GRAFT Right 2007   foot   TUBAL LIGATION      Family History  Problem Relation Age of Onset   Dementia Father    Diabetes Father    Coronary artery disease Father 10   Hip fracture Father    Alzheimer's disease Father    Atrial fibrillation Mother    Osteoporosis Mother     New complaints: None today  Social history: Lives with her husband  Controlled substance contract: 09/13/20     Review of Systems  Constitutional:  Negative for diaphoresis.  Eyes:  Negative for pain.  Respiratory:  Negative for shortness of breath.   Cardiovascular:  Negative for chest pain, palpitations and leg swelling.  Gastrointestinal:  Negative for abdominal pain.  Endocrine: Negative for polydipsia.  Skin:  Negative for rash.  Neurological:  Negative for dizziness, weakness and headaches.  Hematological:  Does not bruise/bleed easily.  All other systems reviewed and are negative.     Objective:   Physical Exam Vitals and nursing note reviewed.  Constitutional:      General: She is not in acute distress.    Appearance: Normal appearance. She is well-developed.  HENT:     Head: Normocephalic.     Right Ear: Tympanic membrane normal.     Left Ear: Tympanic membrane normal.     Nose: Nose normal.     Mouth/Throat:      Mouth: Mucous membranes are moist.  Eyes:     Pupils: Pupils are equal, round, and reactive to light.  Neck:     Vascular: No carotid bruit or JVD.  Cardiovascular:     Rate and Rhythm: Normal rate and regular rhythm.     Heart sounds: Normal heart sounds.  Pulmonary:     Effort: Pulmonary effort is normal. No respiratory distress.     Breath sounds: Normal  breath sounds. No wheezing or rales.  Chest:     Chest wall: No tenderness.  Abdominal:     General: Bowel sounds are normal. There is no distension or abdominal bruit.     Palpations: Abdomen is soft. There is no hepatomegaly, splenomegaly, mass or pulsatile mass.     Tenderness: There is no abdominal tenderness.  Musculoskeletal:        General: Normal range of motion.     Cervical back: Normal range of motion and neck supple.  Lymphadenopathy:     Cervical: No cervical adenopathy.  Skin:    General: Skin is warm and dry.  Neurological:     Mental Status: She is alert and oriented to person, place, and time.     Deep Tendon Reflexes: Reflexes are normal and symmetric.  Psychiatric:        Behavior: Behavior normal.        Thought Content: Thought content normal.        Judgment: Judgment normal.    BP 136/79   Pulse 83   Temp 98.4 F (36.9 C) (Temporal)   Resp 20   Ht _0  (1.651 m)   Wt 154 lb (69.9 kg)   SpO2 97%   BMI 25.63 kg/m   HGBA1c 7.6%     Assessment & Plan:  Catherine Cooper comes in today with chief complaint of Medical Management of Chronic Issues   Diagnosis and orders addressed:  1. Primary hypertension Low sodium diet - CBC with Differential/Platelet - CMP14+EGFR  2. Mixed hyperlipidemia Low fat diet - Lipid panel - rosuvastatin (CRESTOR) 10 MG tablet; Take 1 tablet (10 mg total) by mouth 3 (three) times a week.  Dispense: 36 tablet; Refill: 1  3. Type 2 diabetes mellitus with diabetic polyneuropathy, without long-term current use of insulin (HCC) Striceter carb counting - Bayer  DCA Hb A1c Waived - FARXIGA 10 MG TABS tablet; TAKE (1) TABLET DAILY IN THE MORNING.  Dispense: 90 tablet; Refill: 1 - gabapentin (NEURONTIN) 100 MG capsule; Take 1 capsule (100 mg total) by mouth daily.  Dispense: 90 capsule; Refill: 1 - glipiZIDE (GLIPIZIDE XL) 10 MG 24 hr tablet; Take 1 tablet (10 mg total) by mouth daily with breakfast.  Dispense: 90 tablet; Refill: 1  4. Stage 3a chronic kidney disease ([place]) Labs pending  5. GAD (generalized anxiety disorder) Stress management - LORazepam (ATIVAN) 0.5 MG tablet; TAKE 1 TABLET TWICE DAILY AS NEEDED FOR ANXIETY OR SLEEP  Dispense: 60 tablet; Refill: 1  6. Age-related osteoporosis without current pathological fracture Weight bearing exercises encouraged - VITAMIN D 25 Hydroxy (Vit-D Deficiency, Fractures)  7. Endometrial cancer ([place])  8. BMI 28.0-28.9,adult Discussed diet and exercise for person with BMI >25 Will recheck weight in 3-6 months   9. Gastroesophageal reflux disease without esophagitis Avoid spicy foods Do not eat 2 hours prior to bedtime  - DEXILANT 60 MG capsule; Take 1 capsule (60 mg total) by mouth daily.  Dispense: 90 capsule; Refill: 1   Labs pending Health Maintenance reviewed Diet and exercise encouraged  Follow up plan: 3 months   Mary-Margaret Hassell Done, FNP

## 2021-06-20 ENCOUNTER — Other Ambulatory Visit: Payer: Self-pay | Admitting: Nurse Practitioner

## 2021-06-20 DIAGNOSIS — Z1231 Encounter for screening mammogram for malignant neoplasm of breast: Secondary | ICD-10-CM

## 2021-06-20 LAB — CBC WITH DIFFERENTIAL/PLATELET
Basophils Absolute: 0 10*3/uL (ref 0.0–0.2)
Basos: 1 %
EOS (ABSOLUTE): 0.4 10*3/uL (ref 0.0–0.4)
Eos: 6 %
Hematocrit: 40.9 % (ref 34.0–46.6)
Hemoglobin: 13.4 g/dL (ref 11.1–15.9)
Immature Grans (Abs): 0 10*3/uL (ref 0.0–0.1)
Immature Granulocytes: 0 %
Lymphocytes Absolute: 1.7 10*3/uL (ref 0.7–3.1)
Lymphs: 26 %
MCH: 30.9 pg (ref 26.6–33.0)
MCHC: 32.8 g/dL (ref 31.5–35.7)
MCV: 94 fL (ref 79–97)
Monocytes Absolute: 0.5 10*3/uL (ref 0.1–0.9)
Monocytes: 7 %
Neutrophils Absolute: 3.9 10*3/uL (ref 1.4–7.0)
Neutrophils: 60 %
Platelets: 145 10*3/uL — ABNORMAL LOW (ref 150–450)
RBC: 4.34 x10E6/uL (ref 3.77–5.28)
RDW: 11.5 % — ABNORMAL LOW (ref 11.7–15.4)
WBC: 6.6 10*3/uL (ref 3.4–10.8)

## 2021-06-20 LAB — CMP14+EGFR
ALT: 16 IU/L (ref 0–32)
AST: 16 IU/L (ref 0–40)
Albumin/Globulin Ratio: 1.6 (ref 1.2–2.2)
Albumin: 4.1 g/dL (ref 3.6–4.6)
Alkaline Phosphatase: 96 IU/L (ref 44–121)
BUN/Creatinine Ratio: 16 (ref 12–28)
BUN: 15 mg/dL (ref 8–27)
Bilirubin Total: 0.6 mg/dL (ref 0.0–1.2)
CO2: 26 mmol/L (ref 20–29)
Calcium: 9.6 mg/dL (ref 8.7–10.3)
Chloride: 101 mmol/L (ref 96–106)
Creatinine, Ser: 0.92 mg/dL (ref 0.57–1.00)
Globulin, Total: 2.6 g/dL (ref 1.5–4.5)
Glucose: 170 mg/dL — ABNORMAL HIGH (ref 65–99)
Potassium: 4.3 mmol/L (ref 3.5–5.2)
Sodium: 141 mmol/L (ref 134–144)
Total Protein: 6.7 g/dL (ref 6.0–8.5)
eGFR: 63 mL/min/{1.73_m2} (ref >59)

## 2021-06-20 LAB — VITAMIN D 25 HYDROXY (VIT D DEFICIENCY, FRACTURES): Vit D, 25-Hydroxy: 51.5 ng/mL (ref 30.0–100.0)

## 2021-06-20 LAB — LIPID PANEL
Chol/HDL Ratio: 2.7 ratio (ref 0.0–4.4)
Cholesterol, Total: 125 mg/dL (ref 100–199)
HDL: 46 mg/dL (ref >39)
LDL Chol Calc (NIH): 58 mg/dL (ref 0–99)
Triglycerides: 118 mg/dL (ref 0–149)
VLDL Cholesterol Cal: 21 mg/dL (ref 5–40)

## 2021-06-24 ENCOUNTER — Ambulatory Visit: Payer: Self-pay | Admitting: Nurse Practitioner

## 2021-07-17 ENCOUNTER — Telehealth: Payer: Self-pay

## 2021-07-19 ENCOUNTER — Telehealth: Payer: Self-pay

## 2021-07-19 NOTE — Telephone Encounter (Signed)
   Key: [id number] - Rx #: [id number] Need help? Call us at [phone]  Status Sent to Plan today  Drug Dexilant 60MG  dr capsules Form  [employer] Electronic PA Form [phone] NCPDP) Original Claim Info MR,70 +MUST USE ESOMEPRAZOLE DR., Beecher Mcardle., OMEPRAZOLE DR., PANTOPRAZOLE DR. TABLET OR MED NEC PA ONLY [id number] NON-FORMULARY DRUG, CONTACT [id number]:

## 2021-07-22 NOTE — Telephone Encounter (Signed)
[facility] [employer]  received a request from your provider for coverage of Dexilant (dexlansoprazole). The request was denied because: Coverage for this medication is denied for the following reason(s). We reviewed the information we received about your condition and circumstances. We used plan approved criteria when making this decision. The policy states that this medication may be approved when: -The member is unable to take the required number of formulary alternatives for the given diagnosis due to an intolerance or contraindication OR -The member has tried and failed the required number of formulary alternatives. Based on the policy and the information we received your request is denied. We did not receive documentation that you meet the criteria outlined above. Formulary alternatives are: esomeprazole delayed-rel, lansoprazole delayed-rel capsule, omeprazole delayed-rel, pantoprazole delayed-rel tablet. (Requirement: 3 in a class with 3 or more alternatives, 2 in a class with 2 alternatives, or 1 in a class with only 1 alternative.)

## 2021-07-25 DIAGNOSIS — L84 Corns and callosities: Secondary | ICD-10-CM | POA: Diagnosis not present

## 2021-07-25 DIAGNOSIS — M79676 Pain in unspecified toe(s): Secondary | ICD-10-CM | POA: Diagnosis not present

## 2021-07-25 DIAGNOSIS — B351 Tinea unguium: Secondary | ICD-10-CM | POA: Diagnosis not present

## 2021-07-25 DIAGNOSIS — E1142 Type 2 diabetes mellitus with diabetic polyneuropathy: Secondary | ICD-10-CM | POA: Diagnosis not present

## 2021-07-25 MED ORDER — PANTOPRAZOLE SODIUM 40 MG PO TBEC: 40.0000 mg | DELAYED_RELEASE_TABLET | Freq: Every day | ORAL | 3 refills | Status: DC

## 2021-07-25 NOTE — Telephone Encounter (Signed)
Protionix sent to [place]

## 2021-07-25 NOTE — Telephone Encounter (Signed)
Patient aware.

## 2021-07-25 NOTE — Telephone Encounter (Signed)
Let patient know and see what she would loike- ompreazole of protonix

## 2021-07-25 NOTE — Telephone Encounter (Signed)
Spoke with patient and notified that insurance would not cover. She is currently using omeprazole that the pharmacist recommended. She states that it helps some but not enough. Can she try the protonix? Please send to pharmacy

## 2021-07-25 NOTE — Telephone Encounter (Signed)
Protonix sent to [place]

## 2021-07-25 NOTE — Telephone Encounter (Signed)
Lmtcb.

## 2021-08-15 ENCOUNTER — Telehealth: Payer: Medicare Other

## 2021-08-29 ENCOUNTER — Other Ambulatory Visit: Payer: Self-pay | Admitting: Nurse Practitioner

## 2021-08-29 DIAGNOSIS — E1142 Type 2 diabetes mellitus with diabetic polyneuropathy: Secondary | ICD-10-CM

## 2021-09-11 ENCOUNTER — Ambulatory Visit
Admission: RE | Admit: 2021-09-11 | Discharge: 2021-09-11 | Disposition: A | Discharge status: Home / Self Care | Payer: Medicare Other | Source: Ambulatory Visit | Attending: Nurse Practitioner | Admitting: Nurse Practitioner

## 2021-09-11 ENCOUNTER — Other Ambulatory Visit: Payer: Self-pay

## 2021-09-11 DIAGNOSIS — Z1231 Encounter for screening mammogram for malignant neoplasm of breast: Secondary | ICD-10-CM | POA: Diagnosis not present

## 2021-09-13 ENCOUNTER — Other Ambulatory Visit: Payer: Self-pay | Admitting: Nurse Practitioner

## 2021-09-13 DIAGNOSIS — R928 Other abnormal and inconclusive findings on diagnostic imaging of breast: Secondary | ICD-10-CM

## 2021-09-19 ENCOUNTER — Ambulatory Visit (INDEPENDENT_AMBULATORY_CARE_PROVIDER_SITE_OTHER): Payer: Medicare Other | Admitting: Nurse Practitioner

## 2021-09-19 ENCOUNTER — Other Ambulatory Visit: Payer: Self-pay

## 2021-09-19 ENCOUNTER — Encounter: Payer: Self-pay | Admitting: Nurse Practitioner

## 2021-09-19 VITALS — BP 117/67 | HR 87 | Temp 97.7°F | Resp 20 | Ht 65.0 in | Wt 151.0 lb

## 2021-09-19 DIAGNOSIS — E782 Mixed hyperlipidemia: Secondary | ICD-10-CM

## 2021-09-19 DIAGNOSIS — N1831 Chronic kidney disease, stage 3a: Secondary | ICD-10-CM | POA: Diagnosis not present

## 2021-09-19 DIAGNOSIS — Z8542 Personal history of malignant neoplasm of other parts of uterus: Secondary | ICD-10-CM

## 2021-09-19 DIAGNOSIS — E1142 Type 2 diabetes mellitus with diabetic polyneuropathy: Secondary | ICD-10-CM | POA: Diagnosis not present

## 2021-09-19 DIAGNOSIS — M810 Age-related osteoporosis without current pathological fracture: Secondary | ICD-10-CM | POA: Diagnosis not present

## 2021-09-19 DIAGNOSIS — K591 Functional diarrhea: Secondary | ICD-10-CM | POA: Diagnosis not present

## 2021-09-19 DIAGNOSIS — Z23 Encounter for immunization: Secondary | ICD-10-CM

## 2021-09-19 DIAGNOSIS — Z6828 Body mass index (BMI) 28.0-28.9, adult: Secondary | ICD-10-CM

## 2021-09-19 DIAGNOSIS — I10 Essential (primary) hypertension: Secondary | ICD-10-CM

## 2021-09-19 DIAGNOSIS — F411 Generalized anxiety disorder: Secondary | ICD-10-CM

## 2021-09-19 DIAGNOSIS — K219 Gastro-esophageal reflux disease without esophagitis: Secondary | ICD-10-CM

## 2021-09-19 LAB — CBC WITH DIFFERENTIAL/PLATELET
Basophils Absolute: 0.1 10*3/uL (ref 0.0–0.2)
Basos: 1 %
EOS (ABSOLUTE): 0.3 10*3/uL (ref 0.0–0.4)
Eos: 5 %
Hematocrit: 40.8 % (ref 34.0–46.6)
Hemoglobin: 13.7 g/dL (ref 11.1–15.9)
Immature Grans (Abs): 0 10*3/uL (ref 0.0–0.1)
Immature Granulocytes: 0 %
Lymphocytes Absolute: 1.9 10*3/uL (ref 0.7–3.1)
Lymphs: 32 %
MCH: 31 pg (ref 26.6–33.0)
MCHC: 33.6 g/dL (ref 31.5–35.7)
MCV: 92 fL (ref 79–97)
Monocytes Absolute: 0.5 10*3/uL (ref 0.1–0.9)
Monocytes: 8 %
Neutrophils Absolute: 3.3 10*3/uL (ref 1.4–7.0)
Neutrophils: 54 %
Platelets: 157 10*3/uL (ref 150–450)
RBC: 4.42 x10E6/uL (ref 3.77–5.28)
RDW: 11.9 % (ref 11.7–15.4)
WBC: 6 10*3/uL (ref 3.4–10.8)

## 2021-09-19 LAB — BAYER DCA HB A1C WAIVED: HB A1C (BAYER DCA - WAIVED): 7.3 % — ABNORMAL HIGH (ref 4.8–5.6)

## 2021-09-19 LAB — CMP14+EGFR
ALT: 15 IU/L (ref 0–32)
AST: 18 IU/L (ref 0–40)
Albumin/Globulin Ratio: 1.8 (ref 1.2–2.2)
Albumin: 4.3 g/dL (ref 3.6–4.6)
Alkaline Phosphatase: 93 IU/L (ref 44–121)
BUN/Creatinine Ratio: 13 (ref 12–28)
BUN: 13 mg/dL (ref 8–27)
Bilirubin Total: 0.7 mg/dL (ref 0.0–1.2)
CO2: 26 mmol/L (ref 20–29)
Calcium: 9.2 mg/dL (ref 8.7–10.3)
Chloride: 104 mmol/L (ref 96–106)
Creatinine, Ser: 1.02 mg/dL — ABNORMAL HIGH (ref 0.57–1.00)
Globulin, Total: 2.4 g/dL (ref 1.5–4.5)
Glucose: 128 mg/dL — ABNORMAL HIGH (ref 70–99)
Potassium: 4.4 mmol/L (ref 3.5–5.2)
Sodium: 143 mmol/L (ref 134–144)
Total Protein: 6.7 g/dL (ref 6.0–8.5)
eGFR: 55 mL/min/{1.73_m2} — ABNORMAL LOW (ref >59)

## 2021-09-19 LAB — LIPID PANEL
Chol/HDL Ratio: 2.2 ratio (ref 0.0–4.4)
Cholesterol, Total: 110 mg/dL (ref 100–199)
HDL: 49 mg/dL (ref >39)
LDL Chol Calc (NIH): 43 mg/dL (ref 0–99)
Triglycerides: 96 mg/dL (ref 0–149)
VLDL Cholesterol Cal: 18 mg/dL (ref 5–40)

## 2021-09-19 MED ORDER — ROSUVASTATIN CALCIUM 10 MG PO TABS: 10.0000 mg | ORAL_TABLET | ORAL | 1 refills | Status: DC

## 2021-09-19 MED ORDER — GABAPENTIN 100 MG PO CAPS: 100.0000 mg | ORAL_CAPSULE | Freq: Every day | ORAL | 1 refills | Status: DC

## 2021-09-19 MED ORDER — GLIPIZIDE ER 10 MG PO TB24: 10.0000 mg | ORAL_TABLET | Freq: Every day | ORAL | 1 refills | Status: DC

## 2021-09-19 MED ORDER — TRULICITY 3 MG/0.5ML ~~LOC~~ SOAJ: 3.0000 mg | SUBCUTANEOUS | 2 refills | Status: DC

## 2021-09-19 MED ORDER — LORAZEPAM 0.5 MG PO TABS: ORAL_TABLET | ORAL | 1 refills | Status: DC

## 2021-09-19 MED ORDER — FARXIGA 10 MG PO TABS: ORAL_TABLET | ORAL | 1 refills | Status: DC

## 2021-09-19 NOTE — Addendum Note (Signed)
Addended by: Rolena Infante on: 09/19/2021 03:03 PM   Modules accepted: Orders

## 2021-09-19 NOTE — Progress Notes (Signed)
Subjective:    Patient ID: Catherine Cooper, female    DOB: Aug 10, 1941, 80 y.o.   MRN: [id number]   Chief Complaint: medical management of chronic issues     HPI:  1. Primary hypertension No c/o chest pain, sob or headache. Does keep check of blood pressure at home and is usually in normal range. BP Readings from Last 3 Encounters:  06/19/21 136/79  03/26/21 136/86  12/18/20 140/82     2. Mixed hyperlipidemia Does try to watch diet but does not do much exercise. Lab Results  Component Value Date   CHOL 125 06/19/2021   HDL 46 06/19/2021   LDLCALC 58 06/19/2021   TRIG 118 06/19/2021   CHOLHDL 2.7 06/19/2021     3. Type 2 diabetes mellitus with diabetic polyneuropathy, without long-term current use of insulin (HCC) Fasting blood sugars are running around 120-160. Has had a few 200's no low blood sugars. Lab Results  Component Value Date   HGBA1C 7.6 (H) 06/19/2021     4. Gastroesophageal reflux disease without esophagitis Is was on dexilant and was soing well. Insurance refused to pay for it any longer. Now she is on protonix and still has some break through symptoms  5. Stage 3a chronic kidney disease (HCC) No problems voiding with no edema Lab Results  Component Value Date   CREATININE 0.92 06/19/2021     6. Functional diarrhea Still has. Does not wonder far from home after eating.  7. Age-related osteoporosis without current pathological fracture last dexascan was done on 09/12/20 with t score of -2.7. she doe snot do any dedicated exercise. Does take daily vitamin d supplement.  8. History of endometrial cancer Denis any vaginal bleeding. Does not see oncology for this  9. GAD (generalized anxiety disorder) Says she is doing well. GAD 7 : Generalized Anxiety Score 09/19/2021 06/19/2021 03/26/2021 12/18/2020  Nervous, Anxious, on Edge 0 0 0 1  Control/stop worrying 0 0 0 1  Worry too much - different things 0 0 0 1  Trouble relaxing 0 0 0 0  Restless 0 0 0  0  Easily annoyed or irritable 0 0 0 0  Afraid - awful might happen 0 0 0 0  Total GAD 7 Score 0 0 0 3  Anxiety Difficulty Not difficult at all Not difficult at all Not difficult at all Not difficult at all      10. BMI 28.0-28.9,adult No recent weight changes Wt Readings from Last 3 Encounters:  09/19/21 151 lb (68.5 kg)  06/19/21 154 lb (69.9 kg)  05/29/21 157 lb (71.2 kg)   BMI Readings from Last 3 Encounters:  09/19/21 25.13 kg/m  06/19/21 25.63 kg/m  05/29/21 26.13 kg/m      Outpatient Encounter Medications as of 09/19/2021  Medication Sig   ALPHAGAN P 0.1 % SOLN Place 1 drop into both eyes 2 (two) times daily.    aspirin 81 MG tablet Take 81 mg by mouth every evening.    bifidobacterium infantis (ALIGN) capsule Take 1 capsule by mouth daily.   calcium citrate-vitamin D (CITRACAL+D) 315-200 MG-UNIT tablet Take 1 tablet by mouth 2 (two) times daily.   Cholecalciferol (VITAMIN D3) 2000 UNITS TABS Take 1 capsule by mouth daily.    DEXILANT 60 MG capsule Take 1 capsule (60 mg total) by mouth daily.   FARXIGA 10 MG TABS tablet TAKE (1) TABLET DAILY IN THE MORNING.   gabapentin (NEURONTIN) 100 MG capsule Take 1 capsule (100 mg total) by mouth  daily.   glipiZIDE (GLIPIZIDE XL) 10 MG 24 hr tablet Take 1 tablet (10 mg total) by mouth daily with breakfast.   ibuprofen (ADVIL,MOTRIN) 800 MG tablet Take 1 tablet (800 mg total) by mouth every 6 (six) hours as needed for pain.   latanoprost (XALATAN) 0.005 % ophthalmic solution Place 1 drop into both eyes at bedtime.    LORazepam (ATIVAN) 0.5 MG tablet TAKE 1 TABLET TWICE DAILY AS NEEDED FOR ANXIETY OR SLEEP   Multiple Vitamins-Minerals (PRESERVISION/LUTEIN PO) Take by mouth.   ONETOUCH ULTRA test strip TEST BLOOD SUGAR ONCE DAILYAND AS NEEDED Dx E11.9   pantoprazole (PROTONIX) 40 MG tablet Take 1 tablet (40 mg total) by mouth daily.   rosuvastatin (CRESTOR) 10 MG tablet Take 1 tablet (10 mg total) by mouth 3 (three) times a week.    SSD 1 % cream    TRULICITY 3 JK/9.3OI SOPN inject 3 mg as directed once a week   vitamin B-12 (CYANOCOBALAMIN) 1000 MCG tablet Take 1,000 mcg by mouth daily.     No facility-administered encounter medications on file as of 09/19/2021.    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;  Surgeon: Newt Minion, MD;  Location: [facility];  Service: Orthopedics;  Laterality: Right;   COLONOSCOPY  2013   EYE SURGERY     FOOT SURGERY     Right foot / left heel   glaucome surgery right eye     skin cancer removed right side of nose     SKIN GRAFT Right 2007   foot   TUBAL LIGATION      Family History  Problem Relation Age of Onset   Dementia Father    Diabetes Father    Coronary artery disease Father 42   Hip fracture Father    Alzheimer's disease Father    Atrial fibrillation Mother    Osteoporosis Mother     New complaints: None today  Social history: Lives with her husband  Controlled substance contract: 09/19/21     Review of Systems  Constitutional:  Negative for diaphoresis.  Eyes:  Negative for pain.  Respiratory:  Negative for shortness of breath.   Cardiovascular:  Negative for chest pain, palpitations and leg swelling.  Gastrointestinal:  Negative for abdominal pain.  Endocrine: Negative for polydipsia.  Skin:  Negative for rash.  Neurological:  Negative for dizziness, weakness and headaches.  Hematological:  Does not bruise/bleed easily.  All other systems reviewed and are negative.     Objective:   Physical Exam Vitals and nursing note reviewed.  Constitutional:      General: She is not in acute distress.    Appearance: Normal  appearance. She is well-developed.  HENT:     Head: Normocephalic.     Right Ear: Tympanic membrane normal.     Left Ear: Tympanic membrane normal.     Nose: Nose normal.     Mouth/Throat:     Mouth: Mucous membranes are moist.  Eyes:     Pupils: Pupils are equal, round, and reactive to light.  Neck:     Vascular: No carotid bruit or JVD.  Cardiovascular:  Rate and Rhythm: Normal rate and regular rhythm.     Heart sounds: Normal heart sounds.  Pulmonary:     Effort: Pulmonary effort is normal. No respiratory distress.     Breath sounds: Normal breath sounds. No wheezing or rales.  Chest:     Chest wall: No tenderness.  Abdominal:     General: Bowel sounds are normal. There is no distension or abdominal bruit.     Palpations: Abdomen is soft. There is no hepatomegaly, splenomegaly, mass or pulsatile mass.     Tenderness: There is no abdominal tenderness.  Musculoskeletal:        General: Normal range of motion.     Cervical back: Normal range of motion and neck supple.     Comments: Bil finger joints are swollen and with mild deformity  Lymphadenopathy:     Cervical: No cervical adenopathy.  Skin:    General: Skin is warm and dry.  Neurological:     Mental Status: She is alert and oriented to person, place, and time.     Deep Tendon Reflexes: Reflexes are normal and symmetric.  Psychiatric:        Behavior: Behavior normal.        Thought Content: Thought content normal.        Judgment: Judgment normal.   BP 117/67   Pulse 87   Temp 97.7 F (36.5 C) (Temporal)   Resp 20   Ht 5' 5" (1.651 m)   Wt 151 lb (68.5 kg)   SpO2 99%   BMI 25.13 kg/m   Hgba1c 7.3%      Assessment & Plan:   Catherine Cooper comes in today with chief complaint of Medical Management of Chronic Issues   Diagnosis and orders addressed:  1. Primary hypertension Low sodium diet - CBC with Differential/Platelet - CMP14+EGFR  2. Mixed hyperlipidemia Low fat diet - Lipid panel -  rosuvastatin (CRESTOR) 10 MG tablet; Take 1 tablet (10 mg total) by mouth 3 (three) times a week.  Dispense: 36 tablet; Refill: 1  3. Type 2 diabetes mellitus with diabetic polyneuropathy, without long-term current use of insulin (HCC) Continue to wtatch carbs in diet - Bayer DCA Hb A1c Waived - Dulaglutide (TRULICITY) 3 HU/3.1SH SOPN; Inject 3 mg as directed once a week.  Dispense: 6 mL; Refill: 2 - gabapentin (NEURONTIN) 100 MG capsule; Take 1 capsule (100 mg total) by mouth daily.  Dispense: 90 capsule; Refill: 1 - glipiZIDE (GLIPIZIDE XL) 10 MG 24 hr tablet; Take 1 tablet (10 mg total) by mouth daily with breakfast.  Dispense: 90 tablet; Refill: 1 - FARXIGA 10 MG TABS tablet; TAKE (1) TABLET DAILY IN THE MORNING.  Dispense: 90 tablet; Refill: 1  4. Gastroesophageal reflux disease without esophagitis Avoid spicy foods Do not eat 2 hours prior to bedtime   5. Stage 3a chronic kidney disease ([place]) Labs pending  6. Functional diarrhea Imodium as needed  7. Age-related osteoporosis without current pathological fracture Weight bearing exercise  8. History of endometrial cancer  9. GAD (generalized anxiety disorder) Stress management - LORazepam (ATIVAN) 0.5 MG tablet; TAKE 1 TABLET TWICE DAILY AS NEEDED FOR ANXIETY OR SLEEP  Dispense: 60 tablet; Refill: 1  10. BMI 28.0-28.9,adult Discussed diet and exercise for person with BMI >25 Will recheck weight in 3-6 months    Labs pending Health Maintenance reviewed Diet and exercise encouraged  Follow up plan: 3 months   Mary-Margaret Hassell Done, FNP

## 2021-09-19 NOTE — Patient Instructions (Signed)

## 2021-09-24 ENCOUNTER — Telehealth: Payer: Self-pay | Admitting: *Deleted

## 2021-09-24 DIAGNOSIS — E1142 Type 2 diabetes mellitus with diabetic polyneuropathy: Secondary | ICD-10-CM

## 2021-09-24 NOTE — Telephone Encounter (Signed)
Trulicity 3MG /0.5ML pen-injectors  Key: [id number]   Sent to plan

## 2021-10-01 NOTE — Telephone Encounter (Signed)
°  Your PA has been resolved, no additional PA is required

## 2021-10-10 DIAGNOSIS — L84 Corns and callosities: Secondary | ICD-10-CM | POA: Diagnosis not present

## 2021-10-10 DIAGNOSIS — M79676 Pain in unspecified toe(s): Secondary | ICD-10-CM | POA: Diagnosis not present

## 2021-10-10 DIAGNOSIS — B351 Tinea unguium: Secondary | ICD-10-CM | POA: Diagnosis not present

## 2021-10-10 DIAGNOSIS — E1142 Type 2 diabetes mellitus with diabetic polyneuropathy: Secondary | ICD-10-CM | POA: Diagnosis not present

## 2021-10-22 ENCOUNTER — Telehealth: Payer: Medicare Other

## 2021-10-23 ENCOUNTER — Other Ambulatory Visit: Payer: Self-pay

## 2021-10-23 ENCOUNTER — Ambulatory Visit: Admission: RE | Admit: 2021-10-23 | Payer: Medicare Other | Source: Ambulatory Visit

## 2021-10-23 ENCOUNTER — Ambulatory Visit
Admission: RE | Admit: 2021-10-23 | Discharge: 2021-10-23 | Disposition: A | Discharge status: Home / Self Care | Payer: Medicare PPO | Source: Ambulatory Visit | Attending: Nurse Practitioner | Admitting: Nurse Practitioner

## 2021-10-23 DIAGNOSIS — R928 Other abnormal and inconclusive findings on diagnostic imaging of breast: Secondary | ICD-10-CM

## 2021-11-07 ENCOUNTER — Encounter (INDEPENDENT_AMBULATORY_CARE_PROVIDER_SITE_OTHER): Payer: Self-pay

## 2021-11-07 LAB — HM DIABETES EYE EXAM

## 2021-11-27 IMAGING — MR MR KNEE*L* W/O CM
4 of 6 series · 21 of 40 positions shown · non-contrast
Comparison: None.

CLINICAL DATA: Left knee pain since August 2019.

EXAM:
MRI OF THE LEFT KNEE WITHOUT CONTRAST
TECHNIQUE: Multiplanar, multisequence MR imaging of the knee was performed. No
intravenous contrast was administered.

[Series 3: T2 fat-sat · axial · 4.0mm · 0.50mm/px · z∈[-67,+28]mm · 4 of 24 slices shown (1 of 2)]
[im 1/24]
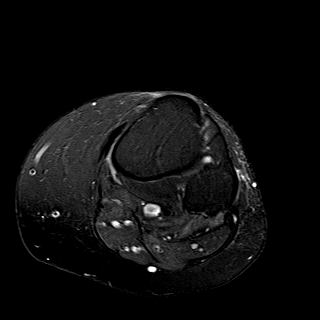
[im 4/24]
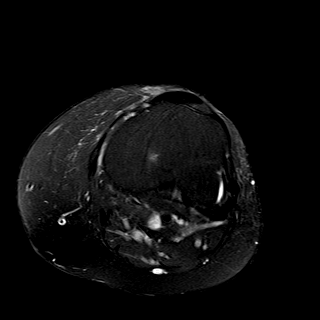
[im 12/24]
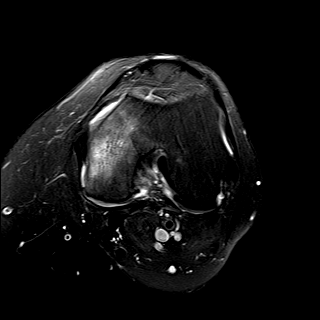
[im 20/24]
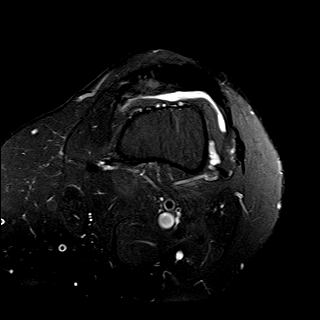

[Series 5: T2 fat-sat · coronal · 4.0mm · 0.29mm/px · 3 of 22 slices shown (2 of 2)]
[im 5/22]
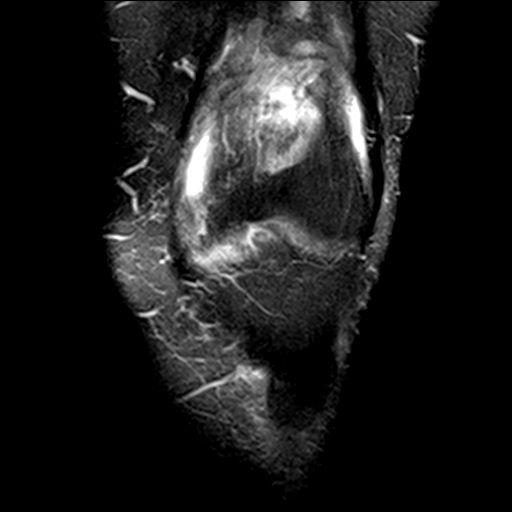
[im 13/22]
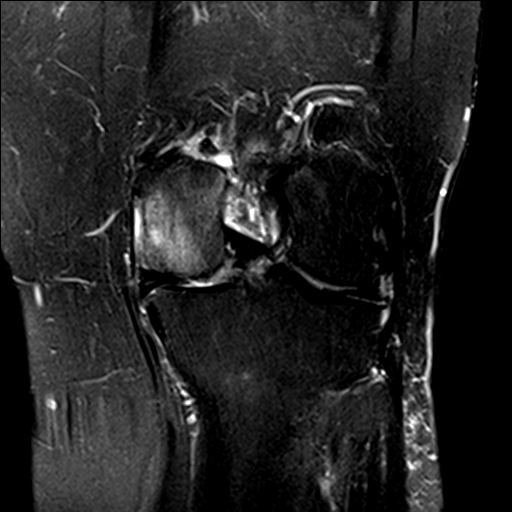
[im 22/22]
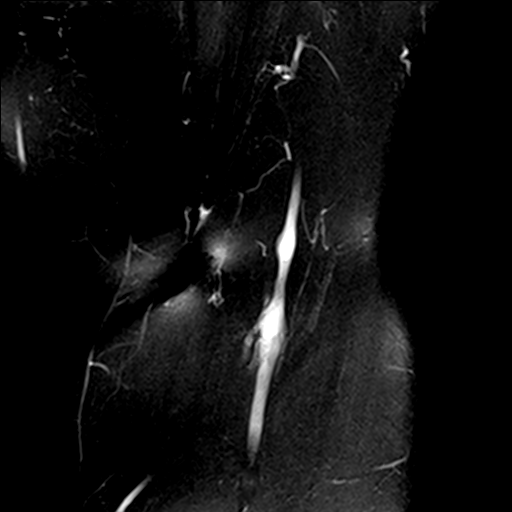

[Series 6: PD fat-sat · coronal · 3.0mm · 0.29mm/px · 7 of 28 slices shown (1 of 2)]
[im 1/28]
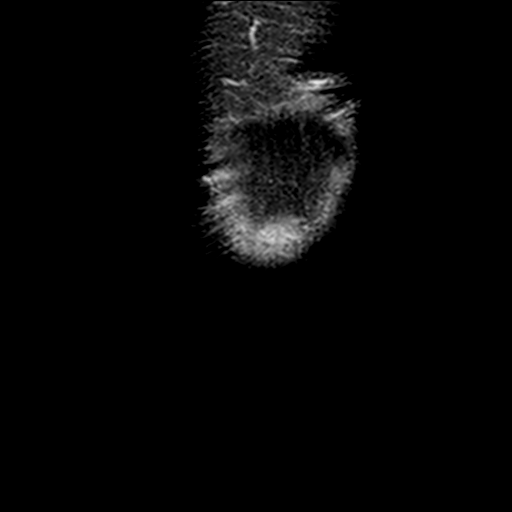
[im 5/28]
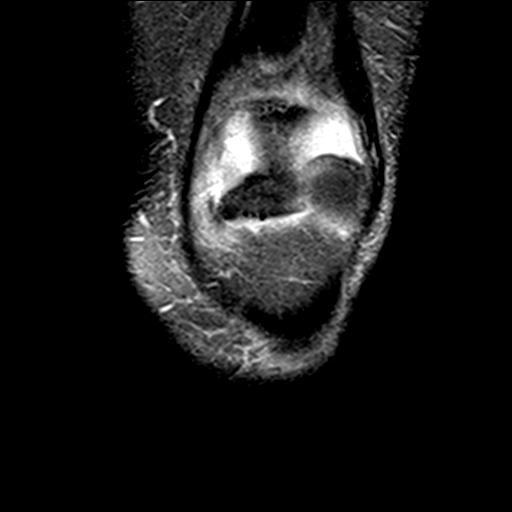
[im 10/28]
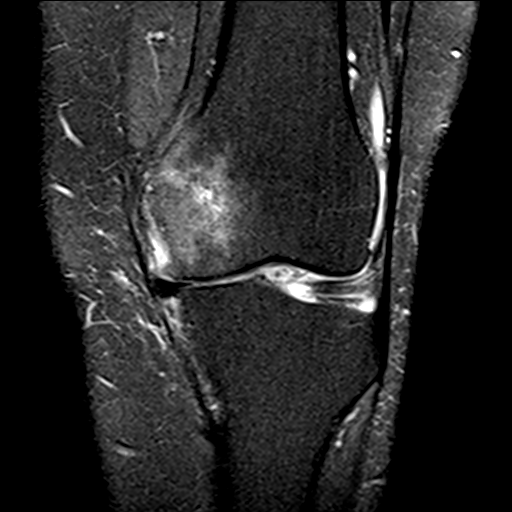
[im 14/28]
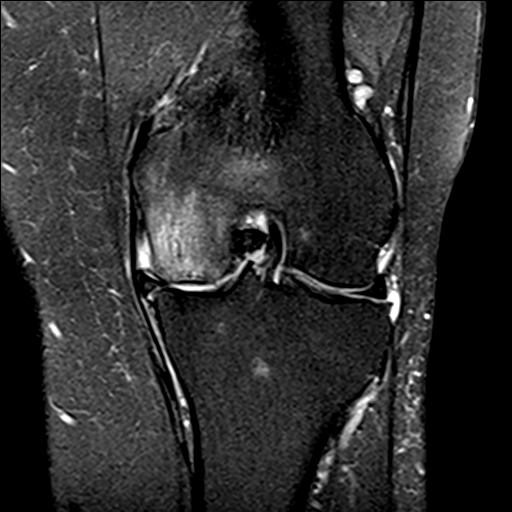
[im 19/28]
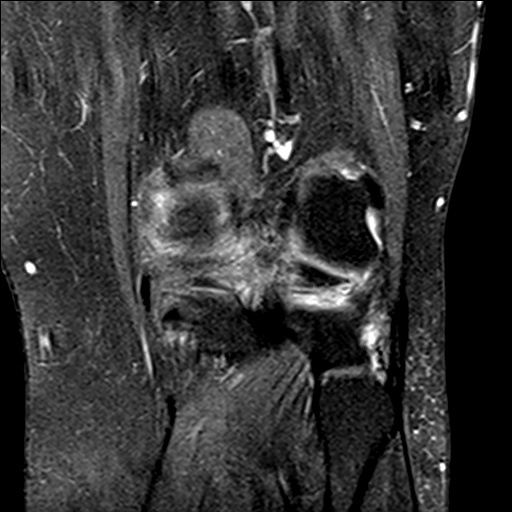
[im 23/28]
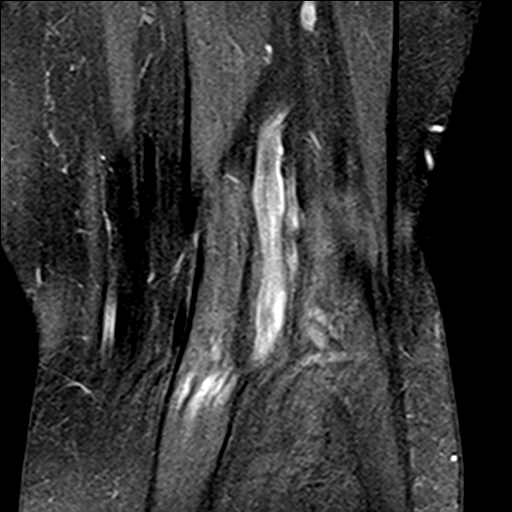
[im 28/28]
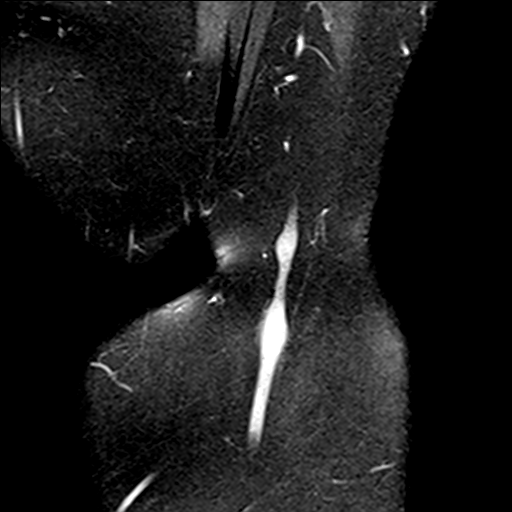

[Series 8: PD fat-sat · sagittal · 3.0mm · 0.29mm/px · 7 of 27 slices shown (2 of 2)]
[im 1/27]
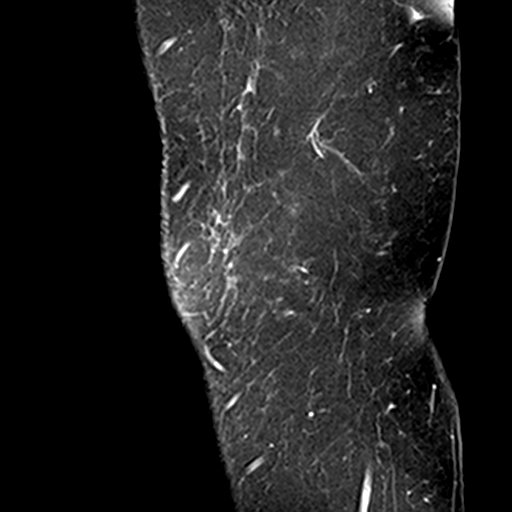
[im 5/27]
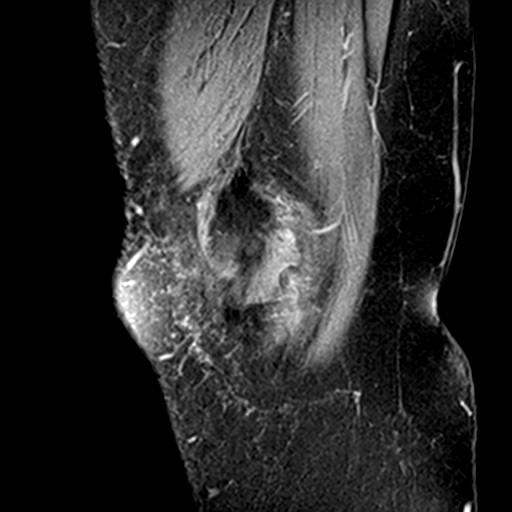
[im 9/27]
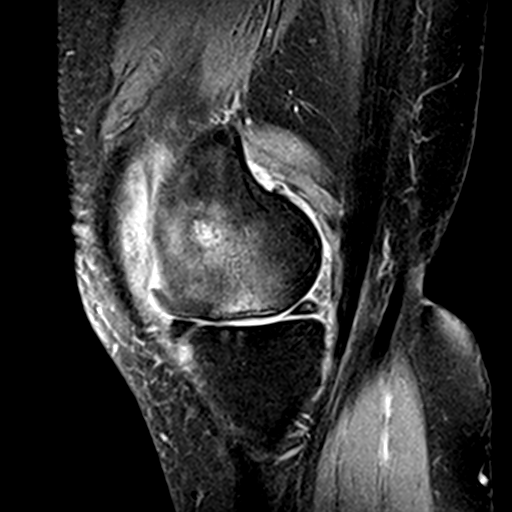
[im 14/27]
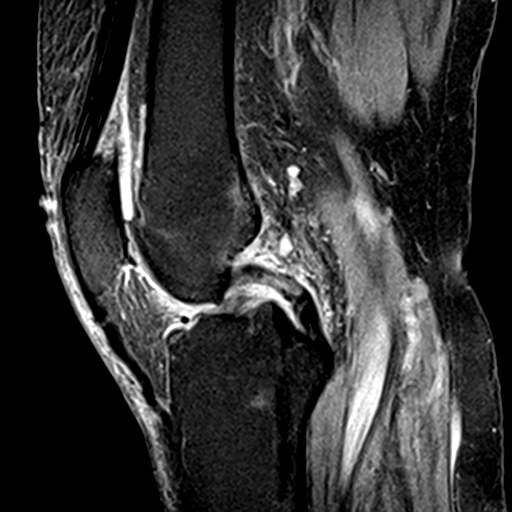
[im 18/27]
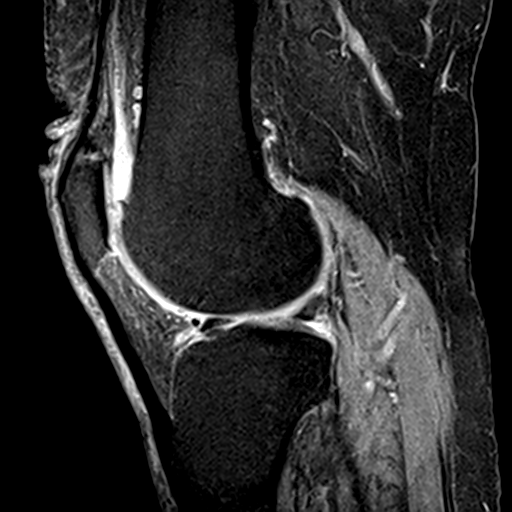
[im 22/27]
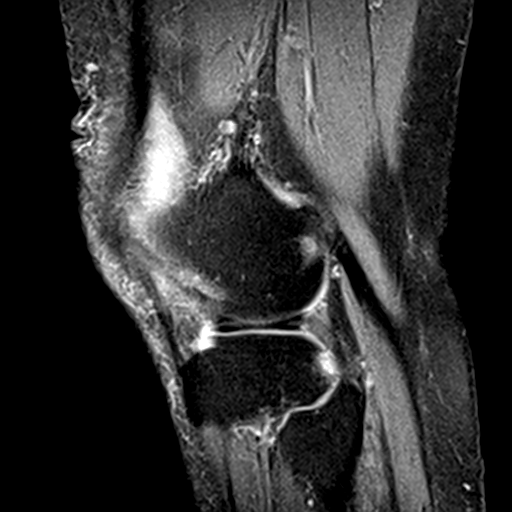
[im 27/27]
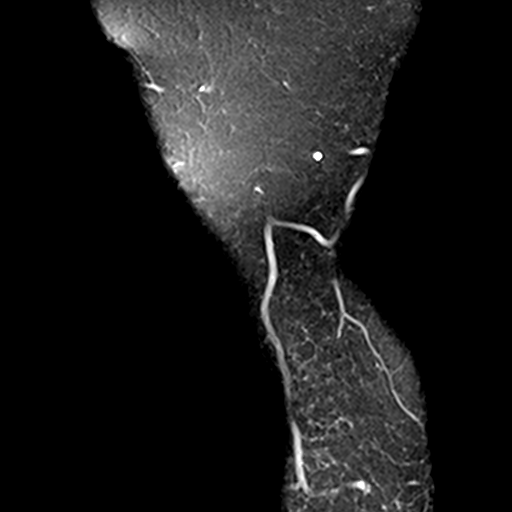

[21 of 40 positions shown; findings below may reference images not displayed]

FINDINGS: MENISCI

Medial meniscus: Radial tear involving the posterior horn at the
meniscal root with detachment and associated medial protrusion of
the meniscus which demonstrates intrasubstance degenerative changes.

Lateral meniscus:  Intact.

LIGAMENTS

Cruciates:  Intact

Collaterals:  Intact

CARTILAGE

Patellofemoral: Advanced degenerative chondrosis with areas of
full-thickness cartilage loss, joint space narrowing and spurring.

Medial: Advanced degenerative chondrosis with full-thickness
cartilage loss, joint space narrowing, osteophytic spurring and a
focus of probable spontaneous osteonecrosis involving the medial
femoral condyle with small fluid collection of the subchondral plate
and marked marrow edema.

Lateral: Moderate degenerative chondrosis with early spurring and
joint space narrowing.

Joint:  Moderate-sized joint effusion.

Popliteal Fossa:  No popliteal mass or Baker's cyst.

Extensor Mechanism: The patella retinacular structures are intact
and the quadriceps and patellar tendons are intact.

Bones: Suspect focus of spontaneous osteonecrosis or less likely
subchondral stress fracture involving the medial femoral condyle
with significant surrounding marrow edema.

Other: Unremarkable knee musculature.
IMPRESSION: 1. Radial tear involving the posterior horn of the medial meniscus
at the meniscal root with detachment and associated medial
protrusion of the meniscus.
2. Intact ligamentous structures.
3. Significant tricompartmental degenerative changes most
significant in the medial compartment.
4. Suspect focus of spontaneous osteonecrosis or less likely
subchondral stress fracture involving the medial femoral condyle
with significant surrounding marrow edema.
5. Moderate-sized joint effusion.

## 2021-12-04 ENCOUNTER — Telehealth: Payer: Medicare Other

## 2021-12-05 ENCOUNTER — Other Ambulatory Visit: Payer: Self-pay | Admitting: Nurse Practitioner

## 2021-12-19 ENCOUNTER — Encounter: Payer: Self-pay | Admitting: Nurse Practitioner

## 2021-12-19 ENCOUNTER — Ambulatory Visit (INDEPENDENT_AMBULATORY_CARE_PROVIDER_SITE_OTHER): Payer: Medicare Other | Admitting: Nurse Practitioner

## 2021-12-19 VITALS — BP 111/64 | HR 82 | Temp 98.6°F | Resp 20 | Ht 65.0 in | Wt 152.0 lb

## 2021-12-19 DIAGNOSIS — Z6828 Body mass index (BMI) 28.0-28.9, adult: Secondary | ICD-10-CM

## 2021-12-19 DIAGNOSIS — I10 Essential (primary) hypertension: Secondary | ICD-10-CM

## 2021-12-19 DIAGNOSIS — M810 Age-related osteoporosis without current pathological fracture: Secondary | ICD-10-CM

## 2021-12-19 DIAGNOSIS — E782 Mixed hyperlipidemia: Secondary | ICD-10-CM | POA: Diagnosis not present

## 2021-12-19 DIAGNOSIS — K591 Functional diarrhea: Secondary | ICD-10-CM

## 2021-12-19 DIAGNOSIS — E1142 Type 2 diabetes mellitus with diabetic polyneuropathy: Secondary | ICD-10-CM

## 2021-12-19 DIAGNOSIS — K219 Gastro-esophageal reflux disease without esophagitis: Secondary | ICD-10-CM

## 2021-12-19 DIAGNOSIS — N1831 Chronic kidney disease, stage 3a: Secondary | ICD-10-CM

## 2021-12-19 DIAGNOSIS — F411 Generalized anxiety disorder: Secondary | ICD-10-CM

## 2021-12-19 LAB — BAYER DCA HB A1C WAIVED: HB A1C (BAYER DCA - WAIVED): 7.2 % — ABNORMAL HIGH (ref 4.8–5.6)

## 2021-12-19 MED ORDER — LORAZEPAM 0.5 MG PO TABS: ORAL_TABLET | ORAL | 1 refills | Status: DC

## 2021-12-19 NOTE — Patient Instructions (Signed)

## 2021-12-19 NOTE — Progress Notes (Signed)
Subjective:    Patient ID: Catherine Cooper, female    DOB: Sep 18, 1941, 81 y.o.   MRN: [id number]   Chief Complaint: medical management of chronic issues     HPI:  Catherine Cooper is a 81 y.o. who identifies as a female who was assigned female at birth.   Social history: Lives with: her husband Work history: retired   Scientist, forensic in today for follow up of the following chronic medical issues:  1. Type 2 diabetes mellitus with diabetic polyneuropathy, without long-term current use of insulin (HCC) Fasting blood sugars are averaging around 110-140. Has had occasional readings of 200. Does try to watch diet. Lab Results  Component Value Date   HGBA1C 7.3 (H) 09/19/2021     2. Primary hypertension No c/o chest pain, sob or headache. Does not check blood pressure at home. BP Readings from Last 3 Encounters:  09/19/21 117/67  06/19/21 136/79  03/26/21 136/86     3. Mixed hyperlipidemia Does try to watch diet and tries to stay active. Lab Results  Component Value Date   CHOL 110 09/19/2021   HDL 49 09/19/2021   LDLCALC 43 09/19/2021   TRIG 96 09/19/2021   CHOLHDL 2.2 09/19/2021     4. Gastroesophageal reflux disease without esophagitis Is on protonix daily and is doing well.  5. Stage 3a chronic kidney disease ([place]) Lab Results  Component Value Date   CREATININE 1.02 (H) 09/19/2021     6. GAD (generalized anxiety disorder) Has ativan to take as needed. Has not needed as of late.  7. Functional diarrhea Is some better but has daily  8. Age-related osteoporosis without current pathological fracture Last dexascan was done on 11/30/2. T score was -2.3. is on prolia  9. BMI 28.0-28.9,adult No recent weight changes Wt Readings from Last 3 Encounters:  12/19/21 152 lb (68.9 kg)  09/19/21 151 lb (68.5 kg)  06/19/21 154 lb (69.9 kg)   BMI Readings from Last 3 Encounters:  12/19/21 25.29 kg/m  09/19/21 25.13 kg/m  06/19/21 25.63 kg/m      New  complaints: None today  Allergies  Allergen Reactions   Jardiance [Empagliflozin] Other (See Comments)    Legs eak   Lipitor [Atorvastatin]     fatique and myalgia   Penicillins Rash   Outpatient Encounter Medications as of 12/19/2021  Medication Sig   ALPHAGAN P 0.1 % SOLN Place 1 drop into both eyes 2 (two) times daily.    aspirin 81 MG tablet Take 81 mg by mouth every evening.    bifidobacterium infantis (ALIGN) capsule Take 1 capsule by mouth daily.   calcium citrate-vitamin D (CITRACAL+D) 315-200 MG-UNIT tablet Take 1 tablet by mouth 2 (two) times daily.   Cholecalciferol (VITAMIN D3) 2000 UNITS TABS Take 1 capsule by mouth daily.    DEXILANT 60 MG capsule Take 1 capsule (60 mg total) by mouth daily. (Patient not taking: Reported on 09/19/2021)   Dulaglutide (TRULICITY) 3 EX/9.3ZJ SOPN Inject 3 mg as directed once a week.   FARXIGA 10 MG TABS tablet TAKE (1) TABLET DAILY IN THE MORNING.   gabapentin (NEURONTIN) 100 MG capsule Take 1 capsule (100 mg total) by mouth daily.   glipiZIDE (GLIPIZIDE XL) 10 MG 24 hr tablet Take 1 tablet (10 mg total) by mouth daily with breakfast.   ibuprofen (ADVIL,MOTRIN) 800 MG tablet Take 1 tablet (800 mg total) by mouth every 6 (six) hours as needed for pain.   latanoprost (XALATAN) 0.005 % ophthalmic solution  Place 1 drop into both eyes at bedtime.    LORazepam (ATIVAN) 0.5 MG tablet TAKE 1 TABLET TWICE DAILY AS NEEDED FOR ANXIETY OR SLEEP   Multiple Vitamins-Minerals (PRESERVISION/LUTEIN PO) Take by mouth.   ONETOUCH ULTRA test strip TEST BLOOD SUGAR ONCE DAILYAND AS NEEDED Dx E11.9   pantoprazole (PROTONIX) 40 MG tablet TAKE ONE TABLET ONCE DAILY   rosuvastatin (CRESTOR) 10 MG tablet Take 1 tablet (10 mg total) by mouth 3 (three) times a week.   SSD 1 % cream    vitamin B-12 (CYANOCOBALAMIN) 1000 MCG tablet Take 1,000 mcg by mouth daily.     No facility-administered encounter medications on file as of 12/19/2021.    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;  Surgeon: Newt Minion, MD;  Location: [place];  Service: Orthopedics;  Laterality: Right;   COLONOSCOPY  2013   EYE SURGERY     FOOT SURGERY     Right foot / left heel   glaucome surgery right eye     skin cancer removed right side of nose     SKIN GRAFT Right 2007   foot   TUBAL LIGATION      Family History  Problem Relation Age of Onset   Dementia Father    Diabetes Father    Coronary artery disease Father 15   Hip fracture Father    Alzheimer's disease Father    Atrial fibrillation Mother    Osteoporosis Mother       Controlled substance contract: 09/23/21     Review of Systems  Constitutional:  Negative for diaphoresis.  Eyes:  Negative for pain.  Respiratory:  Negative for shortness of breath.   Cardiovascular:  Negative for chest pain, palpitations and leg swelling.  Gastrointestinal:  Negative for abdominal pain.  Endocrine: Negative for polydipsia.  Skin:  Negative for rash.  Neurological:  Negative for dizziness, weakness and headaches.  Hematological:  Does not bruise/bleed easily.  All other systems reviewed and are negative.     Objective:   Physical Exam Vitals and nursing note reviewed.  Constitutional:      General: She is not in acute distress.    Appearance: Normal appearance. She is well-developed.  HENT:     Head: Normocephalic.     Right Ear: Tympanic membrane normal.     Left Ear: Tympanic membrane normal.     Nose: Nose normal.     Mouth/Throat:     Mouth: Mucous membranes are moist.  Eyes:     Pupils: Pupils are equal, round, and reactive to light.  Neck:     Vascular: No carotid bruit or  JVD.  Cardiovascular:     Rate and Rhythm: Normal rate and regular rhythm.     Heart sounds: Normal heart sounds.  Pulmonary:     Effort: Pulmonary effort is normal. No respiratory distress.     Breath sounds: Normal breath sounds. No wheezing or rales.  Chest:     Chest wall: No tenderness.  Abdominal:     General: Bowel sounds are normal. There is no distension or abdominal bruit.     Palpations: Abdomen is soft.  There is no hepatomegaly, splenomegaly, mass or pulsatile mass.     Tenderness: There is no abdominal tenderness.  Musculoskeletal:        General: Normal range of motion.     Cervical back: Normal range of motion and neck supple.  Lymphadenopathy:     Cervical: No cervical adenopathy.  Skin:    General: Skin is warm and dry.  Neurological:     Mental Status: She is alert and oriented to person, place, and time.     Deep Tendon Reflexes: Reflexes are normal and symmetric.  Psychiatric:        Behavior: Behavior normal.        Thought Content: Thought content normal.        Judgment: Judgment normal.   BP 111/64    Pulse 82    Temp 98.6 F (37 C) (Temporal)    Resp 20    Ht 5' 5"  (1.651 m)    Wt 152 lb (68.9 kg)    SpO2 98%    BMI 25.29 kg/m          Assessment & Plan:  Catherine Cooper comes in today with chief complaint of Medical Management of Chronic Issues   Diagnosis and orders addressed:  1. Type 2 diabetes mellitus with diabetic polyneuropathy, without long-term current use of insulin ([place]) Labs pending' \continue to watch carbs in diet - Bayer DCA Hb A1c Waived  2. Primary hypertension Low sodium diet - CBC with Differential/Platelet - CMP14+EGFR  3. Mixed hyperlipidemia Low fat diet - Lipid panel  4. Gastroesophageal reflux disease without esophagitis Avoid spicy foods Do not eat 2 hours prior to bedtime  5. Stage 3a chronic kidney disease ([place]) Labs pending  6. GAD (generalized anxiety disorder) Stress management - LORazepam  (ATIVAN) 0.5 MG tablet; TAKE 1 TABLET TWICE DAILY AS NEEDED FOR ANXIETY OR SLEEP  Dispense: 60 tablet; Refill: 1  7. Functional diarrhea Imodium AD as needed  8. Age-related osteoporosis without current pathological fracture Weight bearing exercises  9. BMI 28.0-28.9,adult Discussed diet and exercise for person with BMI >25 Will recheck weight in 3-6 months    Labs pending Health Maintenance reviewed Diet and exercise encouraged  Follow up plan: 3 months   Mary-Margaret Hassell Done, FNP

## 2021-12-20 ENCOUNTER — Ambulatory Visit: Payer: Medicare Other | Admitting: Nurse Practitioner

## 2021-12-20 LAB — CMP14+EGFR
ALT: 18 IU/L (ref 0–32)
AST: 16 IU/L (ref 0–40)
Albumin/Globulin Ratio: 1.7 (ref 1.2–2.2)
Albumin: 4.3 g/dL (ref 3.6–4.6)
Alkaline Phosphatase: 95 IU/L (ref 44–121)
BUN/Creatinine Ratio: 15 (ref 12–28)
BUN: 16 mg/dL (ref 8–27)
Bilirubin Total: 0.5 mg/dL (ref 0.0–1.2)
CO2: 25 mmol/L (ref 20–29)
Calcium: 9.7 mg/dL (ref 8.7–10.3)
Chloride: 103 mmol/L (ref 96–106)
Creatinine, Ser: 1.09 mg/dL — ABNORMAL HIGH (ref 0.57–1.00)
Globulin, Total: 2.5 g/dL (ref 1.5–4.5)
Glucose: 130 mg/dL — ABNORMAL HIGH (ref 70–99)
Potassium: 4.7 mmol/L (ref 3.5–5.2)
Sodium: 144 mmol/L (ref 134–144)
Total Protein: 6.8 g/dL (ref 6.0–8.5)
eGFR: 51 mL/min/{1.73_m2} — ABNORMAL LOW (ref >59)

## 2021-12-20 LAB — LIPID PANEL
Chol/HDL Ratio: 2.1 ratio (ref 0.0–4.4)
Cholesterol, Total: 125 mg/dL (ref 100–199)
HDL: 59 mg/dL (ref >39)
LDL Chol Calc (NIH): 48 mg/dL (ref 0–99)
Triglycerides: 100 mg/dL (ref 0–149)
VLDL Cholesterol Cal: 18 mg/dL (ref 5–40)

## 2021-12-20 LAB — CBC WITH DIFFERENTIAL/PLATELET
Basophils Absolute: 0.1 10*3/uL (ref 0.0–0.2)
Basos: 1 %
EOS (ABSOLUTE): 0.3 10*3/uL (ref 0.0–0.4)
Eos: 4 %
Hematocrit: 42 % (ref 34.0–46.6)
Hemoglobin: 13.8 g/dL (ref 11.1–15.9)
Immature Grans (Abs): 0 10*3/uL (ref 0.0–0.1)
Immature Granulocytes: 0 %
Lymphocytes Absolute: 2 10*3/uL (ref 0.7–3.1)
Lymphs: 28 %
MCH: 31.2 pg (ref 26.6–33.0)
MCHC: 32.9 g/dL (ref 31.5–35.7)
MCV: 95 fL (ref 79–97)
Monocytes Absolute: 0.5 10*3/uL (ref 0.1–0.9)
Monocytes: 7 %
Neutrophils Absolute: 4.2 10*3/uL (ref 1.4–7.0)
Neutrophils: 60 %
Platelets: 165 10*3/uL (ref 150–450)
RBC: 4.43 x10E6/uL (ref 3.77–5.28)
RDW: 11.5 % — ABNORMAL LOW (ref 11.7–15.4)
WBC: 7 10*3/uL (ref 3.4–10.8)

## 2022-01-03 ENCOUNTER — Telehealth: Payer: Self-pay

## 2022-02-20 ENCOUNTER — Other Ambulatory Visit: Payer: Self-pay | Admitting: Nurse Practitioner

## 2022-02-20 DIAGNOSIS — E1142 Type 2 diabetes mellitus with diabetic polyneuropathy: Secondary | ICD-10-CM

## 2022-02-24 ENCOUNTER — Telehealth: Payer: Self-pay | Admitting: Nurse Practitioner

## 2022-02-24 NOTE — Telephone Encounter (Signed)
Patient notified that we do not have samples - patient will check with pharmacy tomorrow and other local pharmacies if needed. She will contact the office if she is not able to get filled  ?

## 2022-02-24 NOTE — Telephone Encounter (Signed)
Pt says she is almost out of Trulicity and pharmacy doesn't have any in stock right now. ? ?Wants to know if we have any samples we can give her? ?

## 2022-02-27 DIAGNOSIS — E1142 Type 2 diabetes mellitus with diabetic polyneuropathy: Secondary | ICD-10-CM | POA: Diagnosis not present

## 2022-02-27 DIAGNOSIS — M79676 Pain in unspecified toe(s): Secondary | ICD-10-CM | POA: Diagnosis not present

## 2022-02-27 DIAGNOSIS — B351 Tinea unguium: Secondary | ICD-10-CM | POA: Diagnosis not present

## 2022-02-27 DIAGNOSIS — L84 Corns and callosities: Secondary | ICD-10-CM | POA: Diagnosis not present

## 2022-03-17 DIAGNOSIS — H401221 Low-tension glaucoma, left eye, mild stage: Secondary | ICD-10-CM | POA: Diagnosis not present

## 2022-03-17 DIAGNOSIS — H401212 Low-tension glaucoma, right eye, moderate stage: Secondary | ICD-10-CM | POA: Diagnosis not present

## 2022-03-17 DIAGNOSIS — H524 Presbyopia: Secondary | ICD-10-CM | POA: Diagnosis not present

## 2022-03-24 ENCOUNTER — Encounter: Payer: Self-pay | Admitting: Nurse Practitioner

## 2022-03-24 ENCOUNTER — Ambulatory Visit (INDEPENDENT_AMBULATORY_CARE_PROVIDER_SITE_OTHER): Payer: Medicare Other | Admitting: Nurse Practitioner

## 2022-03-24 VITALS — BP 109/60 | HR 86 | Temp 98.6°F | Resp 20 | Ht 65.0 in | Wt 150.0 lb

## 2022-03-24 DIAGNOSIS — M810 Age-related osteoporosis without current pathological fracture: Secondary | ICD-10-CM | POA: Diagnosis not present

## 2022-03-24 DIAGNOSIS — E1142 Type 2 diabetes mellitus with diabetic polyneuropathy: Secondary | ICD-10-CM

## 2022-03-24 DIAGNOSIS — N1831 Chronic kidney disease, stage 3a: Secondary | ICD-10-CM

## 2022-03-24 DIAGNOSIS — K219 Gastro-esophageal reflux disease without esophagitis: Secondary | ICD-10-CM

## 2022-03-24 DIAGNOSIS — I10 Essential (primary) hypertension: Secondary | ICD-10-CM

## 2022-03-24 DIAGNOSIS — F411 Generalized anxiety disorder: Secondary | ICD-10-CM

## 2022-03-24 DIAGNOSIS — Z6828 Body mass index (BMI) 28.0-28.9, adult: Secondary | ICD-10-CM

## 2022-03-24 DIAGNOSIS — E782 Mixed hyperlipidemia: Secondary | ICD-10-CM

## 2022-03-24 LAB — CMP14+EGFR
ALT: 20 IU/L (ref 0–32)
AST: 14 IU/L (ref 0–40)
Albumin/Globulin Ratio: 1.9 (ref 1.2–2.2)
Albumin: 4.1 g/dL (ref 3.6–4.6)
Alkaline Phosphatase: 87 IU/L (ref 44–121)
BUN/Creatinine Ratio: 9 — ABNORMAL LOW (ref 12–28)
BUN: 9 mg/dL (ref 8–27)
Bilirubin Total: 0.6 mg/dL (ref 0.0–1.2)
CO2: 26 mmol/L (ref 20–29)
Calcium: 9.5 mg/dL (ref 8.7–10.3)
Chloride: 102 mmol/L (ref 96–106)
Creatinine, Ser: 1.03 mg/dL — ABNORMAL HIGH (ref 0.57–1.00)
Globulin, Total: 2.2 g/dL (ref 1.5–4.5)
Glucose: 156 mg/dL — ABNORMAL HIGH (ref 70–99)
Potassium: 4.4 mmol/L (ref 3.5–5.2)
Sodium: 142 mmol/L (ref 134–144)
Total Protein: 6.3 g/dL (ref 6.0–8.5)
eGFR: 55 mL/min/{1.73_m2} — ABNORMAL LOW (ref >59)

## 2022-03-24 LAB — CBC WITH DIFFERENTIAL/PLATELET
Basophils Absolute: 0.1 10*3/uL (ref 0.0–0.2)
Basos: 1 %
EOS (ABSOLUTE): 0.4 10*3/uL (ref 0.0–0.4)
Eos: 7 %
Hematocrit: 40.1 % (ref 34.0–46.6)
Hemoglobin: 13.2 g/dL (ref 11.1–15.9)
Immature Grans (Abs): 0 10*3/uL (ref 0.0–0.1)
Immature Granulocytes: 0 %
Lymphocytes Absolute: 1.6 10*3/uL (ref 0.7–3.1)
Lymphs: 28 %
MCH: 31.1 pg (ref 26.6–33.0)
MCHC: 32.9 g/dL (ref 31.5–35.7)
MCV: 95 fL (ref 79–97)
Monocytes Absolute: 0.4 10*3/uL (ref 0.1–0.9)
Monocytes: 7 %
Neutrophils Absolute: 3.3 10*3/uL (ref 1.4–7.0)
Neutrophils: 57 %
Platelets: 150 10*3/uL (ref 150–450)
RBC: 4.24 x10E6/uL (ref 3.77–5.28)
RDW: 11.5 % — ABNORMAL LOW (ref 11.7–15.4)
WBC: 5.8 10*3/uL (ref 3.4–10.8)

## 2022-03-24 LAB — BAYER DCA HB A1C WAIVED: HB A1C (BAYER DCA - WAIVED): 7.3 % — ABNORMAL HIGH (ref 4.8–5.6)

## 2022-03-24 LAB — LIPID PANEL
Chol/HDL Ratio: 2.5 ratio (ref 0.0–4.4)
Cholesterol, Total: 116 mg/dL (ref 100–199)
HDL: 46 mg/dL (ref >39)
LDL Chol Calc (NIH): 49 mg/dL (ref 0–99)
Triglycerides: 113 mg/dL (ref 0–149)
VLDL Cholesterol Cal: 21 mg/dL (ref 5–40)

## 2022-03-24 MED ORDER — ROSUVASTATIN CALCIUM 10 MG PO TABS: 10.0000 mg | ORAL_TABLET | ORAL | 1 refills | Status: DC

## 2022-03-24 MED ORDER — GLIPIZIDE ER 10 MG PO TB24: 10.0000 mg | ORAL_TABLET | Freq: Every day | ORAL | 1 refills | Status: DC

## 2022-03-24 MED ORDER — TRULICITY 3 MG/0.5ML ~~LOC~~ SOAJ: 3.0000 mg | SUBCUTANEOUS | 2 refills | Status: DC

## 2022-03-24 MED ORDER — LORAZEPAM 0.5 MG PO TABS: ORAL_TABLET | ORAL | 1 refills | Status: DC

## 2022-03-24 MED ORDER — PANTOPRAZOLE SODIUM 40 MG PO TBEC: 40.0000 mg | DELAYED_RELEASE_TABLET | Freq: Every day | ORAL | 1 refills | Status: DC

## 2022-03-24 MED ORDER — GABAPENTIN 100 MG PO CAPS: 100.0000 mg | ORAL_CAPSULE | Freq: Every day | ORAL | 1 refills | Status: DC

## 2022-03-24 MED ORDER — FARXIGA 10 MG PO TABS: ORAL_TABLET | ORAL | 1 refills | Status: DC

## 2022-03-24 NOTE — Progress Notes (Signed)
Subjective:    Patient ID: Catherine Cooper, female    DOB: 1941-07-15, 81 y.o.   MRN: [id number]  Chief Complaint: medical management of chronic issues     HPI:  Catherine Cooper is a 81 y.o. who identifies as a female who was assigned female at birth.   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 Primary hypertension No c/o chest pain, sob or headache. Does not check blood pressure at home. BP Readings from Last 3 Encounters:  12/19/21 111/64  09/19/21 117/67  06/19/21 136/79     2. Type 2 diabetes mellitus with diabetic polyneuropathy, without long-term current use of insulin (HCC) Fasting blood sugars are running around 110-170. No low blood sugars Lab Results  Component Value Date   HGBA1C 7.2 (H) 12/19/2021     3. Mixed hyperlipidemia Does try to watch diet but does no dedicated exercise. Lab Results  Component Value Date   CHOL 125 12/19/2021   HDL 59 12/19/2021   LDLCALC 48 12/19/2021   TRIG 100 12/19/2021   CHOLHDL 2.1 12/19/2021     4. Stage 3a chronic kidney disease (HCC) No voiding issues Lab Results  Component Value Date   CREATININE 1.09 (H) 12/19/2021     5. Gastroesophageal reflux disease without esophagitis Is on protonix and is doing well. She still has occasional heart burn symptoms.  6. GAD (generalized anxiety disorder) Is on ativan daily. Is doing well.    03/24/2022    8:50 AM 09/19/2021   10:37 AM 06/19/2021    9:03 AM 03/26/2021    8:38 AM  GAD 7 : Generalized Anxiety Score  Nervous, Anxious, on Edge 0 0 0 0  Control/stop worrying 0 0 0 0  Worry too much - different things 0 0 0 0  Trouble relaxing 0 0 0 0  Restless 0 0 0 0  Easily annoyed or irritable 0 0 0 0  Afraid - awful might happen 0 0 0 0  Total GAD 7 Score 0 0 0 0  Anxiety Difficulty Not difficult at all Not difficult at all Not difficult at all Not difficult at all       7. Age-related osteoporosis without current pathological fracture Last dexascan was done on 09/11/20. Her t score was -2.7.  8. BMI 28.0-28.9,adult No recent weight changes Wt Readings from Last 3 Encounters:  03/24/22 150 lb (68 kg)  12/19/21 152 lb (68.9 kg)  09/19/21 151 lb (68.5 kg)   BMI Readings from Last 3 Encounters:  03/24/22 24.96 kg/m  12/19/21 25.29 kg/m  09/19/21 25.13 kg/m      New complaints: None today  Allergies  Allergen Reactions   Jardiance [Empagliflozin] Other (See Comments)    Legs eak   Lipitor [Atorvastatin]     fatique and myalgia   Penicillins Rash   Outpatient Encounter Medications as of 03/24/2022  Medication Sig   ALPHAGAN P 0.1 % SOLN Place 1 drop into both eyes 2 (two) times daily.    aspirin 81 MG tablet Take 81 mg by mouth every evening.    bifidobacterium infantis (ALIGN) capsule Take 1 capsule by mouth daily.   calcium citrate-vitamin D (CITRACAL+D) 315-200 MG-UNIT tablet Take 1 tablet by mouth 2 (two) times daily.   Cholecalciferol (VITAMIN D3) 2000 UNITS TABS Take 1 capsule by mouth daily.    Dulaglutide (TRULICITY) 3 NL/9.7QB SOPN Inject 3 mg as  directed once a week.   FARXIGA 10 MG TABS tablet TAKE (1) TABLET DAILY IN THE MORNING.   gabapentin (NEURONTIN) 100 MG capsule Take 1 capsule (100 mg total) by mouth daily.   glipiZIDE (GLIPIZIDE XL) 10 MG 24 hr tablet Take 1 tablet (10 mg total) by mouth daily with breakfast.   ibuprofen (ADVIL,MOTRIN) 800 MG tablet Take 1 tablet (800 mg total) by mouth every 6 (six) hours as needed for pain.   latanoprost (XALATAN) 0.005 % ophthalmic solution Place 1 drop into both eyes at bedtime.    LORazepam (ATIVAN) 0.5 MG tablet TAKE 1 TABLET TWICE DAILY AS NEEDED FOR ANXIETY OR SLEEP   Multiple Vitamins-Minerals (PRESERVISION/LUTEIN PO) Take by mouth.   ONETOUCH ULTRA test strip TEST BLOOD SUGAR ONCE DAILYAND AS NEEDED Dx E11.9   pantoprazole (PROTONIX) 40 MG tablet TAKE ONE TABLET ONCE  DAILY   rosuvastatin (CRESTOR) 10 MG tablet Take 1 tablet (10 mg total) by mouth 3 (three) times a week.   SSD 1 % cream    vitamin B-12 (CYANOCOBALAMIN) 1000 MCG tablet Take 1,000 mcg by mouth daily.     No facility-administered encounter medications on file as of 03/24/2022.    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;  Surgeon: Newt Minion, MD;  Location: [place];  Service: Orthopedics;  Laterality: Right;   COLONOSCOPY  2013   EYE SURGERY     FOOT SURGERY     Right foot / left heel   glaucome surgery right eye     skin cancer removed right side of nose     SKIN GRAFT Right 2007   foot   TUBAL LIGATION      Family History  Problem Relation Age of Onset   Dementia Father    Diabetes Father    Coronary artery disease Father 84   Hip fracture Father    Alzheimer's disease Father    Atrial fibrillation Mother    Osteoporosis Mother       Controlled substance contract: 09/23/21     Review of Systems  Constitutional:  Negative for diaphoresis.  Eyes:  Negative for pain.  Respiratory:  Negative for shortness of breath.   Cardiovascular:  Negative for chest pain, palpitations and leg swelling.  Gastrointestinal:  Negative for abdominal pain.  Endocrine: Negative for polydipsia.  Skin:  Negative for rash.  Neurological:  Negative for dizziness, weakness and headaches.  Hematological:  Does not bruise/bleed easily.  All other systems reviewed and are negative.      Objective:   Physical Exam Vitals and nursing note reviewed.  Constitutional:      General: She is not in acute distress.    Appearance: Normal appearance. She is well-developed.   HENT:     Head: Normocephalic.     Right Ear: Tympanic membrane normal.     Left Ear: Tympanic membrane normal.     Nose: Nose normal.     Mouth/Throat:     Mouth: Mucous membranes are moist.  Eyes:     Pupils: Pupils are equal, round, and reactive to light.  Neck:     Vascular: No carotid bruit or  JVD.  Cardiovascular:     Rate and Rhythm: Normal rate and regular rhythm.     Heart sounds: Normal heart sounds.  Pulmonary:     Effort: Pulmonary effort is normal. No respiratory distress.     Breath sounds: Normal breath sounds. No wheezing or rales.  Chest:     Chest wall: No tenderness.  Abdominal:     General: Bowel sounds are normal. There is no distension or abdominal bruit.     Palpations: Abdomen is soft. There is no hepatomegaly, splenomegaly, mass or pulsatile mass.     Tenderness: There is no abdominal tenderness.  Musculoskeletal:        General: Normal range of motion.     Cervical back: Normal range of motion and neck supple.  Lymphadenopathy:     Cervical: No cervical adenopathy.  Skin:    General: Skin is warm and dry.  Neurological:     Mental Status: She is alert and oriented to person, place, and time.     Deep Tendon Reflexes: Reflexes are normal and symmetric.  Psychiatric:        Behavior: Behavior normal.        Thought Content: Thought content normal.        Judgment: Judgment normal.    BP 109/60   Pulse 86   Temp 98.6 F (37 C) (Temporal)   Resp 20   Ht _0  (1.651 m)   Wt 150 lb (68 kg)   SpO2 98%   BMI 24.96 kg/m   Hgba1c 7.3%      Assessment & Plan:   REATHA SUR comes in today with chief complaint of Medical Management of Chronic Issues   Diagnosis and orders addressed:  1. Primary hypertension Low sodium diet - CBC with Differential/Platelet - CMP14+EGFR  2. Type 2 diabetes mellitus with diabetic polyneuropathy, without long-term current use of insulin (HCC) Continue to watch carbs in diet - Bayer DCA Hb A1c  Waived - Dulaglutide (TRULICITY) 3 FH/2.1FX SOPN; Inject 3 mg as directed once a week.  Dispense: 6 mL; Refill: 2 - gabapentin (NEURONTIN) 100 MG capsule; Take 1 capsule (100 mg total) by mouth daily.  Dispense: 90 capsule; Refill: 1 - glipiZIDE (GLIPIZIDE XL) 10 MG 24 hr tablet; Take 1 tablet (10 mg total) by mouth daily with breakfast.  Dispense: 90 tablet; Refill: 1 - FARXIGA 10 MG TABS tablet; TAKE (1) TABLET DAILY IN THE MORNING.  Dispense: 90 tablet; Refill: 1  3. Mixed hyperlipidemia Low fat diet - Lipid panel - rosuvastatin (CRESTOR) 10 MG tablet; Take 1 tablet (10 mg total) by mouth 3 (three) times a week.  Dispense: 36 tablet; Refill: 1  4. Stage 3a chronic kidney disease ([place]) Labs pending  5. Gastroesophageal reflux disease without esophagitis Avoid spicy foods Do not eat 2 hours prior to bedtime  6. GAD (generalized anxiety disorder) Stress management - LORazepam (ATIVAN) 0.5 MG tablet; TAKE 1 TABLET TWICE DAILY AS NEEDED FOR ANXIETY OR SLEEP  Dispense: 60 tablet; Refill: 1  7. Age-related osteoporosis without current pathological fracture Weight bearing exercises  8. BMI 28.0-28.9,adult Discussed diet and exercise for person with BMI >25 Will recheck weight in 3-6 months    Labs pending Health Maintenance reviewed Diet and exercise encouraged  Follow up plan: 3 months   Mary-Margaret Hassell Done, FNP

## 2022-03-24 NOTE — Patient Instructions (Signed)
Diabetes Mellitus and Foot Care Foot care is an important part of your health, especially when you have diabetes. Diabetes may cause you to have problems because of poor blood flow (circulation) to your feet and legs, which can cause your skin to: Become thinner and drier. Break more easily. Heal more slowly. Peel and crack. You may also have nerve damage (neuropathy) in your legs and feet, causing decreased feeling in them. This means that you may not notice minor injuries to your feet that could lead to more serious problems. Noticing and addressing any potential problems early is the best way to prevent future foot problems. How to care for your feet Foot hygiene  Wash your feet daily with warm water and mild soap. Do not use hot water. Then, pat your feet and the areas between your toes until they are completely dry. Do not soak your feet as this can dry your skin. Trim your toenails straight across. Do not dig under them or around the cuticle. File the edges of your nails with an emery board or nail file. Apply a moisturizing lotion or petroleum jelly to the skin on your feet and to dry, brittle toenails. Use lotion that does not contain alcohol and is unscented. Do not apply lotion between your toes. Shoes and socks Wear clean socks or stockings every day. Make sure they are not too tight. Do not wear knee-high stockings since they may decrease blood flow to your legs. Wear shoes that fit properly and have enough cushioning. Always look in your shoes before you put them on to be sure there are no objects inside. To break in new shoes, wear them for just a few hours a day. This prevents injuries on your feet. Wounds, scrapes, corns, and calluses  Check your feet daily for blisters, cuts, bruises, sores, and redness. If you cannot see the bottom of your feet, use a mirror or ask someone for help. Do not cut corns or calluses or try to remove them with medicine. If you find a minor scrape,  cut, or break in the skin on your feet, keep it and the skin around it clean and dry. You may clean these areas with mild soap and water. Do not clean the area with peroxide, alcohol, or iodine. If you have a wound, scrape, corn, or callus on your foot, look at it several times a day to make sure it is healing and not infected. Check for: Redness, swelling, or pain. Fluid or blood. Warmth. Pus or a bad smell. General tips Do not cross your legs. This may decrease blood flow to your feet. Do not use heating pads or hot water bottles on your feet. They may burn your skin. If you have lost feeling in your feet or legs, you may not know this is happening until it is too late. Protect your feet from hot and cold by wearing shoes, such as at the beach or on hot pavement. Schedule a complete foot exam at least once a year (annually) or more often if you have foot problems. Report any cuts, sores, or bruises to your health care provider immediately. Where to find more information American Diabetes Association: www.diabetes.org Association of Diabetes Care & Education Specialists: www.diabeteseducator.org Contact a health care provider if: You have a medical condition that increases your risk of infection and you have any cuts, sores, or bruises on your feet. You have an injury that is not healing. You have redness on your legs or feet. You   feel burning or tingling in your legs or feet. You have pain or cramps in your legs and feet. Your legs or feet are numb. Your feet always feel cold. You have pain around any toenails. Get help right away if: You have a wound, scrape, corn, or callus on your foot and: You have pain, swelling, or redness that gets worse. You have fluid or blood coming from the wound, scrape, corn, or callus. Your wound, scrape, corn, or callus feels warm to the touch. You have pus or a bad smell coming from the wound, scrape, corn, or callus. You have a fever. You have a red  line going up your leg. Summary Check your feet every day for blisters, cuts, bruises, sores, and redness. Apply a moisturizing lotion or petroleum jelly to the skin on your feet and to dry, brittle toenails. Wear shoes that fit properly and have enough cushioning. If you have foot problems, report any cuts, sores, or bruises to your health care provider immediately. Schedule a complete foot exam at least once a year (annually) or more often if you have foot problems. This information is not intended to replace advice given to you by your health care provider. Make sure you discuss any questions you have with your health care provider. Document Revised: 04/19/2020 Document Reviewed: 04/19/2020 Elsevier Patient Education  2023 Elsevier Inc.  

## 2022-04-23 ENCOUNTER — Ambulatory Visit: Payer: Self-pay | Admitting: *Deleted

## 2022-04-23 DIAGNOSIS — E1142 Type 2 diabetes mellitus with diabetic polyneuropathy: Secondary | ICD-10-CM

## 2022-04-23 DIAGNOSIS — I10 Essential (primary) hypertension: Secondary | ICD-10-CM

## 2022-04-23 NOTE — Chronic Care Management (AMB) (Signed)
  Chronic Care Management   Note  04/23/2022 Name: Catherine Cooper MRN: [id number] DOB: 08-14-1941   Patient has not recently engaged with the Chronic Care Management RN Care Manager. Removing RN Care Manager from Care Team and closing C[facility]. If patient is currently engaged with another CCM team member I will forward this encounter to inform them of my case closure. Patient may be eligible for re-engagement with RN Care Manager in the future if necessary and can discuss this with their PCP.  Chong Sicilian, BSN, RN-BC Embedded Chronic Care Manager [facility] Direct Dial: [phone]

## 2022-04-23 NOTE — Patient Instructions (Signed)
[name]  I have enjoyed working with you through the Chronic Care Management Program at [facility]. Due to program changes and no recent contact with the [facility], I am removing myself from your care team. If you are currently active with another CCM Team Member, you will remain active with them unless they reach out to you with additional information. If you feel that you need RN Care Management services in the future, please talk with your primary care provider to discuss re-engagement with the RN Care Manager.   Thank you for allowing me to participate in your your healthcare journey.  Chong Sicilian, BSN, RN-BC Embedded Chronic Care Manager [facility] Direct Dial: [phone]

## 2022-05-08 DIAGNOSIS — E1142 Type 2 diabetes mellitus with diabetic polyneuropathy: Secondary | ICD-10-CM | POA: Diagnosis not present

## 2022-05-08 DIAGNOSIS — B351 Tinea unguium: Secondary | ICD-10-CM | POA: Diagnosis not present

## 2022-05-08 DIAGNOSIS — M79676 Pain in unspecified toe(s): Secondary | ICD-10-CM | POA: Diagnosis not present

## 2022-05-08 DIAGNOSIS — L84 Corns and callosities: Secondary | ICD-10-CM | POA: Diagnosis not present

## 2022-05-19 ENCOUNTER — Ambulatory Visit: Payer: Medicare Other | Admitting: Dermatology

## 2022-05-20 ENCOUNTER — Ambulatory Visit: Payer: Self-pay | Admitting: Dermatology

## 2022-05-30 ENCOUNTER — Ambulatory Visit (INDEPENDENT_AMBULATORY_CARE_PROVIDER_SITE_OTHER): Payer: Medicare Other

## 2022-05-30 VITALS — Wt 150.0 lb

## 2022-05-30 DIAGNOSIS — Z0000 Encounter for general adult medical examination without abnormal findings: Secondary | ICD-10-CM | POA: Diagnosis not present

## 2022-05-30 NOTE — Progress Notes (Signed)
Subjective:   Catherine Cooper is a 81 y.o. female who presents for Medicare Annual (Subsequent) preventive examination.  Virtual Visit via Telephone Note  I connected with  Catherine Cooper on 05/30/22 at  8:15 AM EDT by telephone and verified that I am speaking with the correct person using two identifiers.  Location: Patient: Home Provider: [facility] Persons participating in the virtual visit: patient/Nurse Health Advisor   I discussed the limitations, risks, security and privacy concerns of performing an evaluation and management service by telephone and the availability of in person appointments. The patient expressed understanding and agreed to proceed.  Interactive audio and video telecommunications were attempted between this nurse and patient, however failed, due to patient having technical difficulties OR patient did not have access to video capability.  We continued and completed visit with audio only.  Some vital signs may be absent or patient reported.   Aolanis Crispen E Derika Eckles, LPN   Review of Systems     Cardiac Risk Factors include: advanced age (>4mn, >>8women);diabetes mellitus;dyslipidemia;hypertension;sedentary lifestyle     Objective:    Today's Vitals   05/30/22 0820  Weight: 150 lb (68 kg)   Body mass index is 24.96 kg/m.     05/30/2022    8:27 AM 05/29/2021    8:18 AM 05/28/2020    8:26 AM 05/19/2019    8:44 AM 03/28/2016    7:36 AM 07/18/2015    8:11 AM 05/09/2015   10:36 AM  Advanced Directives  Does Patient Have a Medical Advance Directive? Yes Yes No No No No No  Type of AParamedicof AGiltnerLiving will HKeenesLiving will       Does patient want to make changes to medical advance directive? No - Patient declined        Copy of [place]? Yes - validated most recent copy scanned in chart (See row information) No - copy requested       Would patient like information on creating a  medical advance directive?   No - Patient declined No - Patient declined No - patient declined information No - patient declined information     Current Medications (verified) Outpatient Encounter Medications as of 05/30/2022  Medication Sig   ALPHAGAN P 0.1 % SOLN Place 1 drop into both eyes 2 (two) times daily.    aspirin 81 MG tablet Take 81 mg by mouth every evening.    bifidobacterium infantis (ALIGN) capsule Take 1 capsule by mouth daily.   calcium citrate-vitamin D (CITRACAL+D) 315-200 MG-UNIT tablet Take 1 tablet by mouth 2 (two) times daily.   Cholecalciferol (VITAMIN D3) 2000 UNITS TABS Take 1 capsule by mouth daily.    Dulaglutide (TRULICITY) 3 MQM/5.7QISOPN Inject 3 mg as directed once a week.   FARXIGA 10 MG TABS tablet TAKE (1) TABLET DAILY IN THE MORNING.   gabapentin (NEURONTIN) 100 MG capsule Take 1 capsule (100 mg total) by mouth daily.   glipiZIDE (GLIPIZIDE XL) 10 MG 24 hr tablet Take 1 tablet (10 mg total) by mouth daily with breakfast.   ibuprofen (ADVIL,MOTRIN) 800 MG tablet Take 1 tablet (800 mg total) by mouth every 6 (six) hours as needed for pain.   latanoprost (XALATAN) 0.005 % ophthalmic solution Place 1 drop into both eyes at bedtime.    LORazepam (ATIVAN) 0.5 MG tablet TAKE 1 TABLET TWICE DAILY AS NEEDED FOR ANXIETY OR SLEEP   Multiple Vitamins-Minerals (PRESERVISION/LUTEIN PO) Take by  mouth.   ONETOUCH ULTRA test strip TEST BLOOD SUGAR ONCE DAILYAND AS NEEDED Dx E11.9   pantoprazole (PROTONIX) 40 MG tablet Take 1 tablet (40 mg total) by mouth daily.   rosuvastatin (CRESTOR) 10 MG tablet Take 1 tablet (10 mg total) by mouth 3 (three) times a week.   SSD 1 % cream    vitamin B-12 (CYANOCOBALAMIN) 1000 MCG tablet Take 1,000 mcg by mouth daily.     No facility-administered encounter medications on file as of 05/30/2022.    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 ([place])    left foot   Diabetes mellitus    type II   Diabetic neuropathy (HCC)    Diarrhea    attributed to diabetic meds   Endometrioid adenocarcinoma    GAD (generalized anxiety disorder)    GERD (gastroesophageal reflux disease)    Glaucoma    Hyperlipidemia    not on medications   Iron deficiency    Nephrolithiasis 2000   Neuropathy    both feet   Osteoporosis    Shingles 2004   Squamous cell carcinoma of skin 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   dorsum of nose   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   Right Upper Nose tx cx3 [id number]   Past Surgical History:  Procedure Laterality Date    vitrectomy  02/23/08   posterior; right eye   ABDOMINAL HYSTERECTOMY     ACHILLES TENDON SURGERY Left 03/28/2016   Procedure: Reconstruction Left Achilles;  Surgeon: MNewt Minion MD;  [facility]  Service: Orthopedics;  Laterality: Left;   AMPUTATION Right 02/14/2015   Procedure: 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;  Surgeon: MNewt Minion MD;  [facility]  Service: Orthopedics;  Laterality: Right;   COLONOSCOPY  2013   EYE SURGERY     FOOT SURGERY     Right foot / left heel   glaucome surgery right eye     skin cancer removed right side of nose     SKIN GRAFT Right 2007   foot   TUBAL LIGATION     Family History  Problem Relation Age of Onset   Dementia Father    Diabetes Father    Coronary artery disease Father 653  Hip fracture Father    Alzheimer's disease Father    Atrial fibrillation Mother    Osteoporosis Mother    Social History   Socioeconomic History   Marital status: Married    Spouse name: CJuanda Crumble  Number of children: 3   Years of education: 12   Highest education level: Some college, no degree  Occupational History   Occupation: retired    Comment: [employer]  Tobacco Use   Smoking status: Never   Smokeless  tobacco: Never  Vaping Use   Vaping Use: Never used  Substance and Sexual Activity   Alcohol use: No   Drug use: No   Sexual activity: Not on file  Other Topics Concern   Not on file  Social History Narrative   2 healthy daughters and 1 son deceased (suicide @ 281   Daughter lives nearby plus 2 grandchildren - [place]  Social Determinants of Health   Financial Resource Strain: Low Risk  (05/30/2022)   Overall Financial Resource  Strain (CARDIA)    Difficulty of Paying Living Expenses: Not hard at all  Food Insecurity: No Food Insecurity (05/30/2022)   Hunger Vital Sign    Worried About Running Out of Food in the Last Year: Never true    Ran Out of Food in the Last Year: Never true  Transportation Needs: No Transportation Needs (05/30/2022)   PRAPARE - Hydrologist (Medical): No    Lack of Transportation (Non-Medical): No  Physical Activity: Inactive (05/30/2022)   Exercise Vital Sign    Days of Exercise per Week: 0 days    Minutes of Exercise per Session: 0 min  Stress: No Stress Concern Present (05/30/2022)   [place] of Stress : Not at all  Social Connections: [place] (05/30/2022)   Social Connection and Isolation Panel [NHANES]    Frequency of Communication with Friends and Family: More than three times a week    Frequency of Social Gatherings with Friends and Family: More than three times a week    Attends Religious Services: More than 4 times per year    Active Member of [employer] or Organizations: Yes    Attends Music therapist: More than 4 times per year    Marital Status: Married    Tobacco Counseling Counseling given: Not Answered   Clinical Intake:  Pre-visit preparation completed: Yes  Pain : No/denies pain     BMI - recorded: 24.96 Nutritional Status: BMI of 19-24  Normal Nutritional Risks: None Diabetes: Yes CBG  done?: No Did pt. bring in CBG monitor from home?: No  How often do you need to have someone help you when you read instructions, pamphlets, or other written materials from your doctor or pharmacy?: 1 - Never  Diabetic? Nutrition Risk Assessment:  Has the patient had any N/V/D within the last 2 months?  No  Does the patient have any non-healing wounds?  No  Has the patient had any unintentional weight loss or weight gain?  No   Diabetes:  Is the patient diabetic?  Yes  If diabetic, was a CBG obtained today?  No  Did the patient bring in their glucometer from home?  No  How often do you monitor your CBG's? Once daily fasting.   Financial Strains and Diabetes Management:  Are you having any financial strains with the device, your supplies or your medication? No .  Does the patient want to be seen by Chronic Care Management for management of their diabetes?  No  Would the patient like to be referred to a Nutritionist or for Diabetic Management?  No   Diabetic Exams:  Diabetic Eye Exam: Completed 11/07/2021.   Diabetic Foot Exam: Completed 03/24/2022. Pt has been advised about the importance in completing this exam.   Interpreter Needed?: No  Information entered by :: Caliana Spires, LPN   Activities of Daily Living    05/30/2022    8:23 AM  In your present state of health, do you have any difficulty performing the following activities:  Hearing? 0  Vision? 0  Difficulty concentrating or making decisions? 0  Walking or climbing stairs? 0  Dressing or bathing? 0  Doing errands, shopping? 0  Preparing Food and eating ? N  Using the Toilet? N  In the past six months, have you accidently leaked urine? Y  Comment mild intermittent - if she coughs/sneezes, etc  Do you have problems with loss  of bowel control? Y  Comment chronic diarrhea - sometimes hard to make it in time  Managing your Medications? N  Managing your Finances? N  Housekeeping or managing your Housekeeping? N     Patient Care Team: Chevis Pretty, FNP as PCP - General (Nurse Practitioner) Steffanie Rainwater, DPM as Consulting Physician (Podiatry) Zadie Rhine Clent Demark, MD as Consulting Physician (Ophthalmology) Monna Fam, MD as Consulting Physician (Ophthalmology) Lavonna Monarch, MD as Consulting Physician (Dermatology) Lavera Guise, [facility] (Pharmacist)  Indicate any recent Medical Services you may have received from other than [facility] providers in the past year (date may be approximate).     Assessment:   This is a routine wellness examination for Catherine Cooper.  Hearing/Vision screen Hearing Screening - Comments:: Denies hearing difficulties   Vision Screening - Comments:: Wears rx glasses - up to date with routine eye exams [facility]  Dietary issues and exercise activities discussed: Current Exercise Habits: The patient does not participate in regular exercise at present, Exercise limited by: None identified   Goals Addressed             This Visit's Progress    Exercise 3x per week (30 min per time)   Not on track    Try to exercise for at least 30 minutes three times weekly.  Seated exercises and stretches, walking, etc 05/30/22 - wants to stay healthy and independent      COMPLETED: Patient Stated       She would like to complete the removal of the SCC on her nose. Patient is awaiting a telephone call from the surgical center with an appointment date/time.       Depression Screen    05/30/2022    8:23 AM 03/24/2022    8:49 AM 09/19/2021   10:37 AM 06/19/2021    9:03 AM 05/29/2021    8:15 AM 03/26/2021    8:38 AM 12/18/2020    8:15 AM  PHQ 2/9 Scores  PHQ - 2 Score 0 0 0 0 0 0 0  PHQ- 9 Score  0 0 0  0     Fall Risk    05/30/2022    8:21 AM 03/24/2022    8:49 AM 09/19/2021   10:37 AM 06/19/2021    9:03 AM 05/29/2021    8:16 AM  Fall Risk   Falls in the past year? 0 0 0 0 0  Number falls in past yr: 0    0  Injury with Fall? 0    0  Risk for fall due to : No Fall  Risks    Medication side effect  Follow up Falls prevention discussed    Falls prevention discussed    FALL RISK PREVENTION PERTAINING TO THE HOME:  Any stairs in or around the home? Yes  If so, are there any without handrails? No  Home free of loose throw rugs in walkways, pet beds, electrical cords, etc? Yes  Adequate lighting in your home to reduce risk of falls? Yes   ASSISTIVE DEVICES UTILIZED TO PREVENT FALLS:  Life alert? No  Use of a cane, walker or w/c? No  Grab bars in the bathroom? Yes  Shower chair or bench in shower? Yes  Elevated toilet seat or a handicapped toilet? Yes   TIMED UP AND GO:  Was the test performed? No . Telephonic visit  Cognitive Function:    02/16/2018    8:37 AM 07/18/2015    8:19 AM  MMSE - Mini Mental  State Exam  Orientation to time 5 5  Orientation to Place 5 5  Registration 3 3  Attention/ Calculation 5 5  Recall 3 3  Language- name 2 objects 2 2  Language- repeat 1 1  Language- follow 3 step command 3 3  Language- read & follow direction 1 1  Write a sentence 1 1  Copy design 1 1  Total score 30 30        05/30/2022    8:27 AM 05/28/2020    8:27 AM 05/19/2019    8:46 AM  6CIT Screen  What Year? 0 points 0 points 0 points  What month? 0 points 0 points 0 points  What time? 0 points 0 points 0 points  Count back from 20 0 points 0 points 0 points  Months in reverse 0 points 0 points 0 points  Repeat phrase 0 points 0 points 2 points  Total Score 0 points 0 points 2 points    Immunizations Immunization History  Administered Date(s) Administered   Fluad Quad(high Dose 65+) 08/11/2019, 07/02/2020, 07/27/2021   Influenza Whole 08/12/2010   Influenza, High Dose Seasonal PF 08/21/2016, 07/14/2017, 07/12/2018   Influenza,inj,Quad PF,6+ Mos 07/04/2013, 07/20/2014, 07/18/2015   Moderna SARS-COV2 Booster Vaccination 05/14/2021   Moderna Sars-Covid-2 Vaccination 12/12/2019, 01/09/2020, 08/21/2020   Pneumococcal Conjugate-13  01/31/2015   Pneumococcal Polysaccharide-23 08/12/2010   Tdap 06/19/2021   Zoster Recombinat (Shingrix) 03/26/2021, 09/19/2021   Zoster, Live 04/12/2014    TDAP status: Up to date  Flu Vaccine status: Up to date  Pneumococcal vaccine status: Up to date  Covid-19 vaccine status: Completed vaccines  Qualifies for Shingles Vaccine? Yes   Zostavax completed Yes   Shingrix Completed?: Yes  Screening Tests Health Maintenance  Topic Date Due   COVID-19 Vaccine (4 - Moderna risk series) 07/09/2021   URINE MICROALBUMIN  03/26/2022   INFLUENZA VACCINE  05/13/2022   MAMMOGRAM  09/11/2022   DEXA SCAN  09/12/2022   HEMOGLOBIN A1C  09/23/2022   OPHTHALMOLOGY EXAM  11/07/2022   FOOT EXAM  03/25/2023   TETANUS/TDAP  06/20/2031   Pneumonia Vaccine 71+ Years old  Completed   Zoster Vaccines- Shingrix  Completed   HPV VACCINES  Aged Out    Health Maintenance  Health Maintenance Due  Topic Date Due   COVID-19 Vaccine (4 - Moderna risk series) 07/09/2021   URINE MICROALBUMIN  03/26/2022   INFLUENZA VACCINE  05/13/2022    Colorectal cancer screening: No longer required.   Mammogram status: Completed 09/11/2021. Repeat every year  Bone Density status: Completed 09/12/2020. Results reflect: Bone density results: OSTEOPOROSIS. Repeat every 2 years.  Lung Cancer Screening: (Low Dose CT Chest recommended if Age 82-80 years, 30 pack-year currently smoking OR have quit w/in 15years.) does not qualify.   Additional Screening:  Hepatitis C Screening: does not qualify  Vision Screening: Recommended annual ophthalmology exams for early detection of glaucoma and other disorders of the eye. Is the patient up to date with their annual eye exam?  Yes  Who is the provider or what is the name of the office in which the patient attends annual eye exams? [facility] If pt is not established with a provider, would they like to be referred to a provider to establish care? No .   Dental  Screening: Recommended annual dental exams for proper oral hygiene  Community Resource Referral / Chronic Care Management: CRR required this visit?  No   CCM required this visit?  No  Plan:     I have personally reviewed and noted the following in the patient's chart:   Medical and social history Use of alcohol, tobacco or illicit drugs  Current medications and supplements including opioid prescriptions.  Functional ability and status Nutritional status Physical activity Advanced directives List of other physicians Hospitalizations, surgeries, and ER visits in previous 12 months Vitals Screenings to include cognitive, depression, and falls Referrals and appointments  In addition, I have reviewed and discussed with patient certain preventive protocols, quality metrics, and best practice recommendations. A written personalized care plan for preventive services as well as general preventive health recommendations were provided to patient.     Sandrea Hammond, LPN   11/14/3341   Nurse Notes: None

## 2022-05-30 NOTE — Patient Instructions (Signed)
Catherine Cooper , Thank you for taking time to come for your Medicare Wellness Visit. I appreciate your ongoing commitment to your health goals. Please review the following plan we discussed and let me know if I can assist you in the future.   Screening recommendations/referrals: Colonoscopy: Done 08/02/2012 - no repeat required Mammogram: Done 09/11/2021 (additional imaging 10/2021 - normal) - Repeat annually  Bone Density: Done 09/12/2020 - Repeat every 2 years  Recommended yearly ophthalmology/optometry visit for glaucoma screening and checkup Recommended yearly dental visit for hygiene and checkup  Vaccinations: Influenza vaccine: Done 07/27/2021 Repeat annually  Pneumococcal vaccine: Done  08/12/2010 & 01/31/2015  Tdap vaccine: Done 06/19/2021 - Repeat in 10 years  Shingles vaccine: Done   03/26/2021 & 09/19/2021  Covid-19: Done  12/12/2019, 01/09/2020, 08/21/2020, 05/14/2021  Advanced directives: in chart  Conditions/risks identified: Aim for 30 minutes of exercise or brisk walking, 6-8 glasses of water, and 5 servings of fruits and vegetables each day.   Next appointment: Follow up in one year for your annual wellness visit    Preventive Care 65 Years and Older, Female Preventive care refers to lifestyle choices and visits with your health care provider that can promote health and wellness. What does preventive care include? A yearly physical exam. This is also called an annual well check. Dental exams once or twice a year. Routine eye exams. Ask your health care provider how often you should have your eyes checked. Personal lifestyle choices, including: Daily care of your teeth and gums. Regular physical activity. Eating a healthy diet. Avoiding tobacco and drug use. Limiting alcohol use. Practicing safe sex. Taking low-dose aspirin every day. Taking vitamin and mineral supplements as recommended by your health care provider. What happens during an annual well check? The services and  screenings done by your health care provider during your annual well check will depend on your age, overall health, lifestyle risk factors, and family history of disease. Counseling  Your health care provider may ask you questions about your: Alcohol use. Tobacco use. Drug use. Emotional well-being. Home and relationship well-being. Sexual activity. Eating habits. History of falls. Memory and ability to understand (cognition). Work and work Statistician. Reproductive health. Screening  You may have the following tests or measurements: Height, weight, and BMI. Blood pressure. Lipid and cholesterol levels. These may be checked every 5 years, or more frequently if you are over 42 years old. Skin check. Lung cancer screening. You may have this screening every year starting at age 68 if you have a 30-pack-year history of smoking and currently smoke or have quit within the past 15 years. Fecal occult blood test (FOBT) of the stool. You may have this test every year starting at age 6. Flexible sigmoidoscopy or colonoscopy. You may have a sigmoidoscopy every 5 years or a colonoscopy every 10 years starting at age 12. Hepatitis C blood test. Hepatitis B blood test. Sexually transmitted disease (STD) testing. Diabetes screening. This is done by checking your blood sugar (glucose) after you have not eaten for a while (fasting). You may have this done every 1-3 years. Bone density scan. This is done to screen for osteoporosis. You may have this done starting at age 64. Mammogram. This may be done every 1-2 years. Talk to your health care provider about how often you should have regular mammograms. Talk with your health care provider about your test results, treatment options, and if necessary, the need for more tests. Vaccines  Your health care provider may recommend certain  vaccines, such as: Influenza vaccine. This is recommended every year. Tetanus, diphtheria, and acellular pertussis (Tdap,  Td) vaccine. You may need a Td booster every 10 years. Zoster vaccine. You may need this after age 68. Pneumococcal 13-valent conjugate (PCV13) vaccine. One dose is recommended after age 55. Pneumococcal polysaccharide (PPSV23) vaccine. One dose is recommended after age 10. Talk to your health care provider about which screenings and vaccines you need and how often you need them. This information is not intended to replace advice given to you by your health care provider. Make sure you discuss any questions you have with your health care provider. Document Released: 10/26/2015 Document Revised: 06/18/2016 Document Reviewed: 07/31/2015 Elsevier Interactive Patient Education  2017 [place] Prevention in the Home Falls can cause injuries. They can happen to people of all ages. There are many things you can do to make your home safe and to help prevent falls. What can I do on the outside of my home? Regularly fix the edges of walkways and driveways and fix any cracks. Remove anything that might make you trip as you walk through a door, such as a raised step or threshold. Trim any bushes or trees on the path to your home. Use bright outdoor lighting. Clear any walking paths of anything that might make someone trip, such as rocks or tools. Regularly check to see if handrails are loose or broken. Make sure that both sides of any steps have handrails. Any raised decks and porches should have guardrails on the edges. Have any leaves, snow, or ice cleared regularly. Use sand or salt on walking paths during winter. Clean up any spills in your garage right away. This includes oil or grease spills. What can I do in the bathroom? Use night lights. Install grab bars by the toilet and in the tub and shower. Do not use towel bars as grab bars. Use non-skid mats or decals in the tub or shower. If you need to sit down in the shower, use a plastic, non-slip stool. Keep the floor dry. Clean up any  water that spills on the floor as soon as it happens. Remove soap buildup in the tub or shower regularly. Attach bath mats securely with double-sided non-slip rug tape. Do not have throw rugs and other things on the floor that can make you trip. What can I do in the bedroom? Use night lights. Make sure that you have a light by your bed that is easy to reach. Do not use any sheets or blankets that are too big for your bed. They should not hang down onto the floor. Have a firm chair that has side arms. You can use this for support while you get dressed. Do not have throw rugs and other things on the floor that can make you trip. What can I do in the kitchen? Clean up any spills right away. Avoid walking on wet floors. Keep items that you use a lot in easy-to-reach places. If you need to reach something above you, use a strong step stool that has a grab bar. Keep electrical cords out of the way. Do not use floor polish or wax that makes floors slippery. If you must use wax, use non-skid floor wax. Do not have throw rugs and other things on the floor that can make you trip. What can I do with my stairs? Do not leave any items on the stairs. Make sure that there are handrails on both sides of the stairs  and use them. Fix handrails that are broken or loose. Make sure that handrails are as long as the stairways. Check any carpeting to make sure that it is firmly attached to the stairs. Fix any carpet that is loose or worn. Avoid having throw rugs at the top or bottom of the stairs. If you do have throw rugs, attach them to the floor with carpet tape. Make sure that you have a light switch at the top of the stairs and the bottom of the stairs. If you do not have them, ask someone to add them for you. What else can I do to help prevent falls? Wear shoes that: Do not have high heels. Have rubber bottoms. Are comfortable and fit you well. Are closed at the toe. Do not wear sandals. If you use a  stepladder: Make sure that it is fully opened. Do not climb a closed stepladder. Make sure that both sides of the stepladder are locked into place. Ask someone to hold it for you, if possible. Clearly mark and make sure that you can see: Any grab bars or handrails. First and last steps. Where the edge of each step is. Use tools that help you move around (mobility aids) if they are needed. These include: Canes. Walkers. Scooters. Crutches. Turn on the lights when you go into a dark area. Replace any light bulbs as soon as they burn out. Set up your furniture so you have a clear path. Avoid moving your furniture around. If any of your floors are uneven, fix them. If there are any pets around you, be aware of where they are. Review your medicines with your doctor. Some medicines can make you feel dizzy. This can increase your chance of falling. Ask your doctor what other things that you can do to help prevent falls. This information is not intended to replace advice given to you by your health care provider. Make sure you discuss any questions you have with your health care provider. Document Released: 07/26/2009 Document Revised: 03/06/2016 Document Reviewed: 11/03/2014 Elsevier Interactive Patient Education  2017 [employer].

## 2022-06-19 ENCOUNTER — Ambulatory Visit (INDEPENDENT_AMBULATORY_CARE_PROVIDER_SITE_OTHER): Payer: Medicare Other | Admitting: Ophthalmology

## 2022-06-19 ENCOUNTER — Encounter (INDEPENDENT_AMBULATORY_CARE_PROVIDER_SITE_OTHER): Payer: Self-pay | Admitting: Ophthalmology

## 2022-06-19 DIAGNOSIS — H353122 Nonexudative age-related macular degeneration, left eye, intermediate dry stage: Secondary | ICD-10-CM

## 2022-06-19 DIAGNOSIS — E113551 Type 2 diabetes mellitus with stable proliferative diabetic retinopathy, right eye: Secondary | ICD-10-CM | POA: Diagnosis not present

## 2022-06-19 DIAGNOSIS — E113492 Type 2 diabetes mellitus with severe nonproliferative diabetic retinopathy without macular edema, left eye: Secondary | ICD-10-CM

## 2022-06-19 DIAGNOSIS — E113412 Type 2 diabetes mellitus with severe nonproliferative diabetic retinopathy with macular edema, left eye: Secondary | ICD-10-CM | POA: Diagnosis not present

## 2022-06-19 DIAGNOSIS — Z794 Long term (current) use of insulin: Secondary | ICD-10-CM

## 2022-06-19 NOTE — Assessment & Plan Note (Signed)
Good PRP nasally.  No active maculopathy observe

## 2022-06-19 NOTE — Assessment & Plan Note (Signed)
Minor followed by OCT and clinical examination no sign of CNVM

## 2022-06-19 NOTE — Assessment & Plan Note (Signed)
Stable OS not likely to progress rapidly due to large chorioretinal scar nasally.

## 2022-06-19 NOTE — Progress Notes (Signed)
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 female who presents to the clinic today for:   HPI     1 year fu ou oct fp Pt states her vision has been stable Pt denies any new floaters or FOL Pt states sometimes it's hard to get her eyes to focus to read Last edited by Hurman Horn, MD on 06/19/2022 10:58 AM.      Referring physician: Chevis Pretty, [place] [address],  [place] [address]  HISTORICAL INFORMATION:   Selected notes from the MEDICAL RECORD NUMBER    Lab Results  Component Value Date   HGBA1C 7.3 (H) 03/24/2022     CURRENT MEDICATIONS: Current Outpatient Medications (Ophthalmic Drugs)  Medication Sig   ALPHAGAN P 0.1 % SOLN Place 1 drop into both eyes 2 (two) times daily.    latanoprost (XALATAN) 0.005 % ophthalmic solution Place 1 drop into both eyes at bedtime.    No current facility-administered medications for this visit. (Ophthalmic Drugs)   Current Outpatient Medications (Other)  Medication Sig   aspirin 81 MG tablet Take 81 mg by mouth every evening.    bifidobacterium infantis (ALIGN) capsule Take 1 capsule by mouth daily.   calcium citrate-vitamin D (CITRACAL+D) 315-200 MG-UNIT tablet Take 1 tablet by mouth 2 (two) times daily.   Cholecalciferol (VITAMIN D3) 2000 UNITS TABS Take 1 capsule by mouth daily.    Dulaglutide (TRULICITY) 3 VH/8.4ON SOPN Inject 3 mg as directed once a week.   FARXIGA 10 MG TABS tablet TAKE (1) TABLET DAILY IN THE MORNING.   gabapentin (NEURONTIN) 100 MG capsule Take 1 capsule (100 mg total) by mouth daily.   glipiZIDE (GLIPIZIDE XL) 10 MG 24 hr tablet Take 1 tablet (10 mg total) by mouth daily with breakfast.   ibuprofen (ADVIL,MOTRIN) 800 MG tablet Take 1 tablet (800 mg total) by mouth every 6 (six) hours as needed for pain.   LORazepam  (ATIVAN) 0.5 MG tablet TAKE 1 TABLET TWICE DAILY AS NEEDED FOR ANXIETY OR SLEEP   Multiple Vitamins-Minerals (PRESERVISION/LUTEIN PO) Take by mouth.   ONETOUCH ULTRA test strip TEST BLOOD SUGAR ONCE DAILYAND AS NEEDED Dx E11.9   pantoprazole (PROTONIX) 40 MG tablet Take 1 tablet (40 mg total) by mouth daily.   rosuvastatin (CRESTOR) 10 MG tablet Take 1 tablet (10 mg total) by mouth 3 (three) times a week.   SSD 1 % cream    vitamin B-12 (CYANOCOBALAMIN) 1000 MCG tablet Take 1,000 mcg by mouth daily.     No current facility-administered medications for this visit. (Other)      REVIEW OF SYSTEMS: ROS   Negative for: Constitutional, Gastrointestinal, Neurological, Skin, Genitourinary, Musculoskeletal, HENT, Endocrine, Cardiovascular, Eyes, Respiratory, Psychiatric, Allergic/Imm, Heme/Lymph Last edited by Morene Rankins, CMA on 06/19/2022 10:05 AM.       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[place])    left foot   Diabetes mellitus  type II   Diabetic neuropathy (HCC)    Diarrhea    attributed to diabetic meds   Endometrioid adenocarcinoma    GAD (generalized anxiety disorder)    GERD (gastroesophageal reflux disease)    Glaucoma    Hyperlipidemia    not on medications   Iron deficiency    Nephrolithiasis 2000   Neuropathy    both feet   Osteoporosis    Shingles 2004   Squamous cell carcinoma of skin 03/20/2020   dorsum of nose   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   Right Upper Nose tx cx3 4f   Past Surgical History:  Procedure Laterality Date    vitrectomy  02/23/08   posterior; right eye   ABDOMINAL HYSTERECTOMY      ACHILLES TENDON SURGERY Left 03/28/2016   Procedure: Reconstruction Left Achilles;  Surgeon: MNewt Minion MD;  [facility]  Service: Orthopedics;  Laterality: Left;   AMPUTATION Right 02/14/2015   Procedure: 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;  Surgeon: MNewt Minion MD;  [facility]  Service: Orthopedics;  Laterality: Right;   COLONOSCOPY  2013   EYE SURGERY     FOOT SURGERY     Right foot / left heel   glaucome surgery right eye     skin cancer removed right side of nose     SKIN GRAFT Right 2007   foot   TUBAL LIGATION      FAMILY HISTORY Family History  Problem Relation Age of Onset   Dementia Father    Diabetes Father    Coronary artery disease Father [phone]  Hip fracture Father    Alzheimer's disease Father    Atrial fibrillation Mother    Osteoporosis Mother     SOCIAL HISTORY Social History   Tobacco Use   Smoking status: Never   Smokeless tobacco: Never  Vaping Use   Vaping Use: Never used  Substance Use Topics   Alcohol use: No   Drug use: No         OPHTHALMIC EXAM:  Base Eye Exam     Visual Acuity (ETDRS)       Right Left   Dist cc 20/40 -1 20/30 +1    Correction: Glasses         Tonometry (Tonopen, 10:10 AM)       Right Left   Pressure 9 5         Pupils       Pupils   Right PERRL   Left PERRL         Visual Fields       Left Right    Full Full         Extraocular Movement       Right Left    Ortho Ortho    -- -- --  --  --  -- -- --   -- -- --  --  --  -- -- --           Neuro/Psych     Oriented x3: Yes   Mood/Affect: Normal         Dilation     Both eyes: 1.0% Mydriacyl, 2.5% Phenylephrine @ 10:06 AM           Slit Lamp and Fundus Exam     External Exam       Right Left   External Normal Normal         Slit Lamp Exam  Right Left   Lids/Lashes Normal Normal   Conjunctiva/Sclera White and quiet White and quiet   Cornea Clear Clear   Anterior Chamber Deep and quiet Deep  and quiet   Iris Round and reactive Round and reactive   Lens Posterior chamber intraocular lens Posterior chamber intraocular lens   Anterior Vitreous Normal Normal         Fundus Exam       Right Left   Posterior Vitreous Normal Normal   Disc Normal Normal   C/D Ratio 0.7 0.55   Macula no clinically significant macular edema, no hemorrhage, Microaneurysms no clinically significant macular edema, no hemorrhage, Microaneurysms   Vessels PDR-quiet NPDR-Severe, stable no sign of progression   Periphery Good PRP mostly nasal superior and inferior Large stable chorioretinal scar nasally, likely prevents progression of retinopathy            IMAGING AND PROCEDURES  Imaging and Procedures for 06/19/22  OCT, Retina - OU - Both Eyes       Right Eye Quality was good. Scan locations included subfoveal. Central Foveal Thickness: 199. Progression has been stable. Findings include abnormal foveal contour, central retinal atrophy, inner retinal atrophy, outer retinal atrophy.   Left Eye Quality was good. Scan locations included subfoveal. Central Foveal Thickness: 250. Progression has been stable.   Notes No active maculopathy OU.      Color Fundus Photography Optos - OU - Both Eyes       Right Eye Progression has been stable. Disc findings include normal observations.   Left Eye Progression has been stable. Disc findings include normal observations.   Notes Good nasal peripheral PRP, PDR quiescent.  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  No active maculopathy observe     ICD-10-CM   1. 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 Intermediate stage nonexudative age-related macular degeneration of left eye  H35.3122     3. Type 2 diabetes mellitus with left eye affected by severe nonproliferative retinopathy and macular edema, without long-term current use of insulin ([place])  Z61.0960     4. 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  OU stable.  Quiet PDR OD.  And severe NPDR with no sign of progression.  Patient understands critical portance of continued blood sugar control monitoring  2.  To follow-up with Dr. Baldemar Lenis of [facility] eye care at any time for visual acuity and/or prescriptive refractive needs.  3.  Ophthalmic Meds Ordered this visit:  No orders of the defined types were placed in this encounter.      Return in about 1 year (around 06/20/2023) for DILATE OU, COLOR FP, OCT.  There are  no Patient Instructions on file for this visit.   Explained the diagnoses, plan, and follow up with the patient and they expressed understanding.  Patient expressed understanding of the importance of proper follow up care.   Clent Demark Sonika Levins M.D. Diseases & Surgery of the Retina and Vitreous [facility] [place] 06/19/22     Abbreviations: M myopia (nearsighted); A astigmatism; H hyperopia (farsighted); P presbyopia; Mrx spectacle prescription;  CTL contact lenses; OD right eye; OS left eye; OU both eyes  XT exotropia; ET esotropia; PEK punctate epithelial keratitis; PEE punctate epithelial erosions; DES dry eye syndrome;  MGD meibomian gland dysfunction; ATs artificial tears; PFAT's preservative free artificial tears; [place] nuclear sclerotic cataract; PSC posterior subcapsular cataract; ERM epi-retinal membrane; PVD posterior vitreous detachment; RD retinal detachment; DM diabetes mellitus; DR diabetic retinopathy; NPDR non-proliferative diabetic retinopathy; PDR proliferative diabetic retinopathy; CSME clinically significant macular edema; DME diabetic macular edema; dbh dot blot hemorrhages; CWS cotton wool spot; POAG primary open angle glaucoma; C/D cup-to-disc ratio; HVF humphrey visual field; GVF goldmann visual field; OCT optical coherence tomography; IOP intraocular pressure; BRVO Branch retinal vein occlusion; CRVO central retinal vein occlusion; CRAO central retinal artery occlusion; BRAO branch retinal artery occlusion; RT retinal tear; SB scleral buckle; PPV pars plana vitrectomy; VH Vitreous hemorrhage; PRP panretinal laser photocoagulation; IVK intravitreal kenalog; VMT vitreomacular traction; MH Macular hole;  NVD neovascularization of the disc; NVE neovascularization elsewhere; AREDS age related eye disease study; ARMD age related macular degeneration; POAG primary open angle glaucoma; EBMD epithelial/anterior basement membrane dystrophy; ACIOL anterior chamber intraocular lens; IOL intraocular lens; PCIOL posterior chamber intraocular lens; Phaco/IOL phacoemulsification with intraocular lens placement; [place] photorefractive keratectomy; LASIK laser assisted in situ keratomileusis; HTN hypertension; DM diabetes mellitus; COPD chronic obstructive pulmonary disease

## 2022-06-24 ENCOUNTER — Ambulatory Visit (INDEPENDENT_AMBULATORY_CARE_PROVIDER_SITE_OTHER): Payer: Medicare Other | Admitting: Nurse Practitioner

## 2022-06-24 ENCOUNTER — Encounter: Payer: Self-pay | Admitting: Nurse Practitioner

## 2022-06-24 VITALS — BP 130/75 | HR 78 | Temp 97.9°F | Resp 20 | Ht 65.0 in | Wt 151.0 lb

## 2022-06-24 DIAGNOSIS — K219 Gastro-esophageal reflux disease without esophagitis: Secondary | ICD-10-CM

## 2022-06-24 DIAGNOSIS — N1831 Chronic kidney disease, stage 3a: Secondary | ICD-10-CM | POA: Diagnosis not present

## 2022-06-24 DIAGNOSIS — M7021 Olecranon bursitis, right elbow: Secondary | ICD-10-CM | POA: Diagnosis not present

## 2022-06-24 DIAGNOSIS — M810 Age-related osteoporosis without current pathological fracture: Secondary | ICD-10-CM | POA: Diagnosis not present

## 2022-06-24 DIAGNOSIS — I10 Essential (primary) hypertension: Secondary | ICD-10-CM

## 2022-06-24 DIAGNOSIS — F411 Generalized anxiety disorder: Secondary | ICD-10-CM

## 2022-06-24 DIAGNOSIS — E782 Mixed hyperlipidemia: Secondary | ICD-10-CM | POA: Diagnosis not present

## 2022-06-24 DIAGNOSIS — E118 Type 2 diabetes mellitus with unspecified complications: Secondary | ICD-10-CM | POA: Diagnosis not present

## 2022-06-24 DIAGNOSIS — K591 Functional diarrhea: Secondary | ICD-10-CM | POA: Diagnosis not present

## 2022-06-24 DIAGNOSIS — Z6828 Body mass index (BMI) 28.0-28.9, adult: Secondary | ICD-10-CM

## 2022-06-24 DIAGNOSIS — E1142 Type 2 diabetes mellitus with diabetic polyneuropathy: Secondary | ICD-10-CM | POA: Diagnosis not present

## 2022-06-24 LAB — BAYER DCA HB A1C WAIVED: HB A1C (BAYER DCA - WAIVED): 7.3 % — ABNORMAL HIGH (ref 4.8–5.6)

## 2022-06-24 MED ORDER — ROSUVASTATIN CALCIUM 10 MG PO TABS: 10.0000 mg | ORAL_TABLET | ORAL | 1 refills | Status: DC

## 2022-06-24 MED ORDER — PREDNISONE 20 MG PO TABS: 40.0000 mg | ORAL_TABLET | Freq: Every day | ORAL | 0 refills | Status: AC

## 2022-06-24 MED ORDER — TRULICITY 3 MG/0.5ML ~~LOC~~ SOAJ: 3.0000 mg | SUBCUTANEOUS | 2 refills | Status: DC

## 2022-06-24 MED ORDER — PANTOPRAZOLE SODIUM 40 MG PO TBEC: 40.0000 mg | DELAYED_RELEASE_TABLET | Freq: Every day | ORAL | 1 refills | Status: DC

## 2022-06-24 MED ORDER — GABAPENTIN 100 MG PO CAPS: 100.0000 mg | ORAL_CAPSULE | Freq: Every day | ORAL | 1 refills | Status: DC

## 2022-06-24 MED ORDER — GLIPIZIDE ER 10 MG PO TB24: 10.0000 mg | ORAL_TABLET | Freq: Every day | ORAL | 1 refills | Status: DC

## 2022-06-24 NOTE — Patient Instructions (Signed)
Elbow Bursitis  Elbow (olecranon) bursitis is the swelling of the fluid-filled sac (bursa) between the tip of your elbow and your skin. A bursa acts as a cushion and protects the joint. If the bursa becomes irritated, it can fill with extra fluid and become inflamed. This condition is also called olecranon bursitis. What are the causes? This condition may be caused by: An elbow injury, such as falling onto the elbow. Leaning on hard surfaces for long periods of time. Infection from an injury that breaks the skin near the elbow. A bone growth (spur) that forms at the tip of the elbow. A medical condition that causes inflammation, such as gout or rheumatoid arthritis. Sometimes the cause is not known. What are the signs or symptoms? The first sign of elbow bursitis is usually swelling at the tip of the elbow. This can grow to be about the size of a golf ball. Swelling may start suddenly or develop gradually. Other symptoms may include: Pain when bending or leaning on the elbow. Stiffness, or not being able to move the elbow normally. If bursitis is caused by an infection, you may have: Redness, warmth, and tenderness of the elbow. Drainage of pus from the swollen area over the elbow, if the skin breaks open. How is this diagnosed? This condition may be diagnosed based on: Your symptoms and medical history. Any recent injuries you have had. A physical exam. X-rays to check for a bone spur or fracture. Draining fluid from the bursa to test it for infection. Blood tests to rule out gout or rheumatoid arthritis. How is this treated? Treatment for this condition depends on the cause. Treatment may include: Medicines. These may include: Over-the-counter medicines to relieve pain and inflammation. Antibiotic medicines if there is an infection. Injections of anti-inflammatory medicines (steroids). Draining fluid from the bursa. Wrapping your elbow with a bandage or compression arm  sleeve. Wearing elbow pads. If these treatments do not help, you may need surgery to remove the bursa. Follow these instructions at home: Medicines Take over-the-counter and prescription medicines only as told by your health care provider. If you were prescribed an antibiotic medicine, take it as told by your health care provider. Do not stop taking the antibiotic even if you start to feel better. Managing pain, stiffness, and swelling     If directed, put ice on the affected area. To do this: Put ice in a plastic bag. Place a towel between your skin and the bag. Leave the ice on for 20 minutes, 2-3 times a day. Remove the ice if your skin turns bright red. This is very important. If you cannot feel pain, heat, or cold, you have a greater risk of damage to the area. If directed, apply heat to the affected area as often as told by your health care provider. Use the heat source that your health care provider recommends, such as a moist heat pack or a heating pad. Place a towel between your skin and the heat source. Leave the heat on 20-30 minutes. Remove the heat if your skin turns bright red. This is especially important if you are unable to feel pain, heat, or cold. You may have a greater risk of getting burned. If your bursitis is caused by an injury, rest your elbow and wear your bandage or sleeve as told by your health care provider. Use elbow pads or elbow wraps to cushion your elbow and control swelling as needed. General instructions Avoid any activities that cause elbow pain. Ask   your health care provider what activities are safe for you. Keep all follow-up visits. This is important. Contact a health care provider if: You have a fever. Your symptoms do not get better with treatment. You have pain or swelling that gets worse, or it goes away and then comes back. You have pus draining from your elbow. You have redness around the elbow area. Your elbow is warm to the touch. Get  help right away if: You have trouble moving your arm, hand, or fingers. Summary Elbow (olecranon) bursitis is the swelling of the fluid-filled sac (bursa) between the tip of your elbow and your skin. Treatment for elbow bursitis depends on the cause. It may include medicines to relieve pain and inflammation, antibiotic medicines to treat an infection, or draining fluid from your elbow. Contact a health care provider if your symptoms do not get better with treatment, or if your symptoms go away and then come back. This information is not intended to replace advice given to you by your health care provider. Make sure you discuss any questions you have with your health care provider. Document Revised: 09/24/2021 Document Reviewed: 09/24/2021 Elsevier Patient Education  2023 Elsevier Inc.  

## 2022-06-24 NOTE — Progress Notes (Signed)
Subjective:    Patient ID: Catherine Cooper, female    DOB: Feb 19, 1941, 81 y.o.   MRN: [id number]   Chief Complaint: medical management of chronic issues     HPI:  Catherine Cooper is a 81 y.o. who identifies as a female who was assigned female at birth.   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 Primary hypertension No c/o chest pain, sob or headache. Does not check blood pressure at home.  BP Readings from Last 3 Encounters:  06/24/22 130/75  03/24/22 109/60  12/19/21 111/64     2. Mixed hyperlipidemia Doe stry to [facility] diet but does no dedicated exercise. Lab Results  Component Value Date   CHOL 116 03/24/2022   HDL 46 03/24/2022   LDLCALC 49 03/24/2022   TRIG 113 03/24/2022   CHOLHDL 2.5 03/24/2022    3. Gastroesophageal reflux disease without esophagitis Is on protonix daily and still has occasional break through symptoms. She use  to be on dexilant but her insurance will not cover it.  4. Controlled type 2 diabetes mellitus with complication, without long-term current use of insulin ([place]) Fasating blood sugars are running around 130-180.  Denies any low blood sugars. Lab Results  Component Value Date   HGBA1C 7.3 (H) 03/24/2022     5. Stage 3a chronic kidney disease (HCC) Novoiding issues or edema. Lab Results  Component Value Date   CREATININE 1.03 (H) 03/24/2022     6. Age-related osteoporosis without current pathological fracture Last dexascan was done on 09/11/20. Her t score was -2.7. She does no weight bearing exercise.  7. GAD (generalized anxiety disorder) Has ativan available , but does not need to take everyday.    06/24/2022    8:42 AM 03/24/2022    8:50 AM 09/19/2021   10:37 AM 06/19/2021    9:03 AM  GAD 7 : Generalized Anxiety Score  Nervous, Anxious, on Edge 0 0 0 0  Control/stop worrying 0 0 0 0  Worry too much - different things 0 0 0 0   Trouble relaxing 0 0 0 0  Restless 0 0 0 0  Easily annoyed or irritable 0 0 0 0  Afraid - awful might happen 0 0 0 0  Total GAD 7 Score 0 0 0 0  Anxiety Difficulty Not difficult at all Not difficult at all Not difficult at all Not difficult at all      8. Functional diarrhea Was worse when she was taking metformin.  9. BMI 28.0-28.9,adult No recent weight changes Wt Readings from Last 3 Encounters:  06/24/22 151 lb (68.5 kg)  05/30/22 150 lb (68 kg)  03/24/22 150 lb (68 kg)   BMI Readings from Last 3 Encounters:  06/24/22 25.13 kg/m  05/30/22 24.96 kg/m  03/24/22 24.96 kg/m     New complaints: - right elbow swollen and sore.  Allergies  Allergen Reactions   Jardiance [Empagliflozin] Other (See Comments)    Legs eak   Lipitor [Atorvastatin]     fatique and myalgia   Penicillins Rash   Outpatient Encounter Medications as of 06/24/2022  Medication Sig   ALPHAGAN P 0.1 % SOLN Place 1 drop into both eyes 2 (two) times daily.    aspirin 81 MG tablet Take 81 mg by mouth every evening.    bifidobacterium infantis (ALIGN) capsule Take 1 capsule by mouth daily.   calcium citrate-vitamin D (  CITRACAL+D) 315-200 MG-UNIT tablet Take 1 tablet by mouth 2 (two) times daily.   Cholecalciferol (VITAMIN D3) 2000 UNITS TABS Take 1 capsule by mouth daily.    Dulaglutide (TRULICITY) 3 NO/6.7EH SOPN Inject 3 mg as directed once a week.   FARXIGA 10 MG TABS tablet TAKE (1) TABLET DAILY IN THE MORNING.   gabapentin (NEURONTIN) 100 MG capsule Take 1 capsule (100 mg total) by mouth daily.   glipiZIDE (GLIPIZIDE XL) 10 MG 24 hr tablet Take 1 tablet (10 mg total) by mouth daily with breakfast.   ibuprofen (ADVIL,MOTRIN) 800 MG tablet Take 1 tablet (800 mg total) by mouth every 6 (six) hours as needed for pain.   latanoprost (XALATAN) 0.005 % ophthalmic solution Place 1 drop into both eyes at bedtime.    LORazepam (ATIVAN) 0.5 MG tablet TAKE 1 TABLET TWICE DAILY AS NEEDED FOR ANXIETY OR SLEEP    Multiple Vitamins-Minerals (PRESERVISION/LUTEIN PO) Take by mouth.   ONETOUCH ULTRA test strip TEST BLOOD SUGAR ONCE DAILYAND AS NEEDED Dx E11.9   pantoprazole (PROTONIX) 40 MG tablet Take 1 tablet (40 mg total) by mouth daily.   rosuvastatin (CRESTOR) 10 MG tablet Take 1 tablet (10 mg total) by mouth 3 (three) times a week.   SSD 1 % cream    vitamin B-12 (CYANOCOBALAMIN) 1000 MCG tablet Take 1,000 mcg by mouth daily.     No facility-administered encounter medications on file as of 06/24/2022.    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;  Surgeon: Newt Minion, MD;  Location: [facility];  Service: Orthopedics;  Laterality: Right;   COLONOSCOPY  2013   EYE SURGERY     FOOT SURGERY     Right foot / left heel   glaucome surgery right eye     skin cancer removed right side of nose     SKIN GRAFT Right 2007   foot   TUBAL LIGATION      Family History  Problem Relation Age of Onset   Dementia Father    Diabetes Father    Coronary artery disease Father 86   Hip fracture Father    Alzheimer's disease Father    Atrial fibrillation Mother    Osteoporosis Mother       Controlled substance contract: 09/23/21     Review of Systems  Constitutional:  Negative for diaphoresis.  Eyes:  Negative for pain.  Respiratory:  Negative for shortness of breath.   Cardiovascular:  Negative for chest pain, palpitations and leg swelling.  Gastrointestinal:  Negative for abdominal pain.  Endocrine: Negative for polydipsia.  Musculoskeletal:  Positive for arthralgias (right elbow pain).  Skin:  Negative for rash.  Neurological:  Negative for dizziness, weakness and  headaches.  Hematological:  Does not bruise/bleed easily.  All other systems reviewed and are negative.      Objective:   Physical Exam Vitals and nursing note reviewed.  Constitutional:      General: She is not in acute distress.    Appearance: Normal appearance. She is well-developed.  HENT:     Head: Normocephalic.     Right Ear: Tympanic membrane normal.  Left Ear: Tympanic membrane normal.     Nose: Nose normal.     Mouth/Throat:     Mouth: Mucous membranes are moist.  Eyes:     Pupils: Pupils are equal, round, and reactive to light.  Neck:     Vascular: No carotid bruit or JVD.  Cardiovascular:     Rate and Rhythm: Normal rate and regular rhythm.     Heart sounds: Normal heart sounds.  Pulmonary:     Effort: Pulmonary effort is normal. No respiratory distress.     Breath sounds: Normal breath sounds. No wheezing or rales.  Chest:     Chest wall: No tenderness.  Abdominal:     General: Bowel sounds are normal. There is no distension or abdominal bruit.     Palpations: Abdomen is soft. There is no hepatomegaly, splenomegaly, mass or pulsatile mass.     Tenderness: There is no abdominal tenderness.  Genitourinary:    General: Normal vulva.  Musculoskeletal:        General: Normal range of motion.     Cervical back: Normal range of motion and neck supple.     Comments: Ht elbow has effusion and is slightly tender to touch.  Lymphadenopathy:     Cervical: No cervical adenopathy.  Skin:    General: Skin is warm and dry.  Neurological:     Mental Status: She is alert and oriented to person, place, and time.     Deep Tendon Reflexes: Reflexes are normal and symmetric.  Psychiatric:        Behavior: Behavior normal.        Thought Content: Thought content normal.        Judgment: Judgment normal.    BP 130/75   Pulse 78   Temp 97.9 F (36.6 C) (Temporal)   Resp 20   Ht $R'5\' 5"'sn$  (1.651 m)   Wt 151 lb (68.5 kg)   SpO2 98%   BMI 25.13 kg/m    Hgba1c  7.3%     Assessment & Plan:  Catherine Cooper comes in today with chief complaint of Medical Management of Chronic Issues   Diagnosis and orders addressed:  1. Primary hypertension Low sodium diet - CBC with Differential/Platelet - CMP14+EGFR  2. Mixed hyperlipidemia Low fat diet - Lipid panel - rosuvastatin (CRESTOR) 10 MG tablet; Take 1 tablet (10 mg total) by mouth 3 (three) times a week.  Dispense: 36 tablet; Refill: 1  3. Gastroesophageal reflux disease without esophagitis Avoid spicy foods Do not eat 2 hours prior to bedtime - pantoprazole (PROTONIX) 40 MG tablet; Take 1 tablet (40 mg total) by mouth daily.  Dispense: 90 tablet; Refill: 1  4. Stage 3a chronic kidney disease ([place]) Labs poending  5. Age-related osteoporosis without current pathological fracture Weight bearing exercises  6. GAD (generalized anxiety disorder) Stress management  7. Functional diarrhea  8. BMI 28.0-28.9,adult Discussed diet and exercise for person with BMI >25 Will recheck weight in 3-6 months   9. Type 2 diabetes mellitus with diabetic polyneuropathy, without long-term current use of insulin (HCC) Watch carbs in diet - Bayer DCA Hb A1c Waived - Microalbumin / creatinine urine ratio - Dulaglutide (TRULICITY) 3 ZG/0.1VC SOPN; Inject 3 mg as directed once a week.  Dispense: 6 mL; Refill: 2 - gabapentin (NEURONTIN) 100 MG capsule; Take 1 capsule (100 mg total) by mouth daily.  Dispense: 90 capsule; Refill: 1 - glipiZIDE (GLIPIZIDE XL) 10 MG 24 hr tablet; Take 1 tablet (10 mg total)  by mouth daily with breakfast.  Dispense: 90 tablet; Refill: 1  10. Olecranon bursitis of right elbow Keep wrap on elbow If no better in a week- RTO  for injection - predniSONE (DELTASONE) 20 MG tablet; Take 2 tablets (40 mg total) by mouth daily with breakfast for 5 days. 2 po daily for 5 days  Dispense: 10 tablet; Refill: 0   Labs pending Health Maintenance reviewed Diet and exercise  encouraged  Follow up plan: 3 months   Mary-Margaret Hassell Done, FNP

## 2022-06-25 LAB — CMP14+EGFR
ALT: 15 IU/L (ref 0–32)
AST: 14 IU/L (ref 0–40)
Albumin/Globulin Ratio: 1.8 (ref 1.2–2.2)
Albumin: 4.2 g/dL (ref 3.7–4.7)
Alkaline Phosphatase: 90 IU/L (ref 44–121)
BUN/Creatinine Ratio: 13 (ref 12–28)
BUN: 12 mg/dL (ref 8–27)
Bilirubin Total: 0.5 mg/dL (ref 0.0–1.2)
CO2: 29 mmol/L (ref 20–29)
Calcium: 9.1 mg/dL (ref 8.7–10.3)
Chloride: 102 mmol/L (ref 96–106)
Creatinine, Ser: 0.94 mg/dL (ref 0.57–1.00)
Globulin, Total: 2.4 g/dL (ref 1.5–4.5)
Glucose: 137 mg/dL — ABNORMAL HIGH (ref 70–99)
Potassium: 4.2 mmol/L (ref 3.5–5.2)
Sodium: 142 mmol/L (ref 134–144)
Total Protein: 6.6 g/dL (ref 6.0–8.5)
eGFR: 61 mL/min/{1.73_m2} (ref >59)

## 2022-06-25 LAB — CBC WITH DIFFERENTIAL/PLATELET
Basophils Absolute: 0.1 10*3/uL (ref 0.0–0.2)
Basos: 1 %
EOS (ABSOLUTE): 0.5 10*3/uL — ABNORMAL HIGH (ref 0.0–0.4)
Eos: 6 %
Hematocrit: 40.9 % (ref 34.0–46.6)
Hemoglobin: 13.6 g/dL (ref 11.1–15.9)
Immature Grans (Abs): 0 10*3/uL (ref 0.0–0.1)
Immature Granulocytes: 0 %
Lymphocytes Absolute: 2.2 10*3/uL (ref 0.7–3.1)
Lymphs: 28 %
MCH: 31.3 pg (ref 26.6–33.0)
MCHC: 33.3 g/dL (ref 31.5–35.7)
MCV: 94 fL (ref 79–97)
Monocytes Absolute: 0.6 10*3/uL (ref 0.1–0.9)
Monocytes: 8 %
Neutrophils Absolute: 4.6 10*3/uL (ref 1.4–7.0)
Neutrophils: 57 %
Platelets: 169 10*3/uL (ref 150–450)
RBC: 4.34 x10E6/uL (ref 3.77–5.28)
RDW: 11.6 % — ABNORMAL LOW (ref 11.7–15.4)
WBC: 8 10*3/uL (ref 3.4–10.8)

## 2022-06-25 LAB — LIPID PANEL
Chol/HDL Ratio: 2.1 ratio (ref 0.0–4.4)
Cholesterol, Total: 115 mg/dL (ref 100–199)
HDL: 54 mg/dL (ref >39)
LDL Chol Calc (NIH): 43 mg/dL (ref 0–99)
Triglycerides: 98 mg/dL (ref 0–149)
VLDL Cholesterol Cal: 18 mg/dL (ref 5–40)

## 2022-06-25 LAB — MICROALBUMIN / CREATININE URINE RATIO
Creatinine, Urine: 85 mg/dL
Microalb/Creat Ratio: 15 mg/g creat (ref 0–29)
Microalbumin, Urine: 13.1 ug/mL

## 2022-07-14 ENCOUNTER — Ambulatory Visit (INDEPENDENT_AMBULATORY_CARE_PROVIDER_SITE_OTHER): Payer: Medicare Other

## 2022-07-14 DIAGNOSIS — Z23 Encounter for immunization: Secondary | ICD-10-CM | POA: Diagnosis not present

## 2022-07-17 DIAGNOSIS — L84 Corns and callosities: Secondary | ICD-10-CM | POA: Diagnosis not present

## 2022-07-17 DIAGNOSIS — B351 Tinea unguium: Secondary | ICD-10-CM | POA: Diagnosis not present

## 2022-07-17 DIAGNOSIS — E1142 Type 2 diabetes mellitus with diabetic polyneuropathy: Secondary | ICD-10-CM | POA: Diagnosis not present

## 2022-07-17 DIAGNOSIS — M79676 Pain in unspecified toe(s): Secondary | ICD-10-CM | POA: Diagnosis not present

## 2022-07-31 DIAGNOSIS — L97511 Non-pressure chronic ulcer of other part of right foot limited to breakdown of skin: Secondary | ICD-10-CM | POA: Diagnosis not present

## 2022-08-14 DIAGNOSIS — L97511 Non-pressure chronic ulcer of other part of right foot limited to breakdown of skin: Secondary | ICD-10-CM | POA: Diagnosis not present

## 2022-09-23 ENCOUNTER — Ambulatory Visit: Payer: BC Managed Care – PPO | Admitting: Nurse Practitioner

## 2022-09-25 DIAGNOSIS — H16142 Punctate keratitis, left eye: Secondary | ICD-10-CM | POA: Diagnosis not present

## 2022-09-25 DIAGNOSIS — L97511 Non-pressure chronic ulcer of other part of right foot limited to breakdown of skin: Secondary | ICD-10-CM | POA: Diagnosis not present

## 2022-09-25 DIAGNOSIS — H524 Presbyopia: Secondary | ICD-10-CM | POA: Diagnosis not present

## 2022-09-25 DIAGNOSIS — E113392 Type 2 diabetes mellitus with moderate nonproliferative diabetic retinopathy without macular edema, left eye: Secondary | ICD-10-CM | POA: Diagnosis not present

## 2022-09-25 DIAGNOSIS — H353132 Nonexudative age-related macular degeneration, bilateral, intermediate dry stage: Secondary | ICD-10-CM | POA: Diagnosis not present

## 2022-09-25 DIAGNOSIS — H401212 Low-tension glaucoma, right eye, moderate stage: Secondary | ICD-10-CM | POA: Diagnosis not present

## 2022-09-25 LAB — HM DIABETES EYE EXAM

## 2022-10-02 ENCOUNTER — Ambulatory Visit (INDEPENDENT_AMBULATORY_CARE_PROVIDER_SITE_OTHER): Payer: Medicare Other | Admitting: Nurse Practitioner

## 2022-10-02 ENCOUNTER — Encounter: Payer: Self-pay | Admitting: Nurse Practitioner

## 2022-10-02 VITALS — BP 148/70 | HR 85 | Temp 98.1°F | Resp 20 | Ht 65.0 in | Wt 149.0 lb

## 2022-10-02 DIAGNOSIS — K219 Gastro-esophageal reflux disease without esophagitis: Secondary | ICD-10-CM

## 2022-10-02 DIAGNOSIS — K591 Functional diarrhea: Secondary | ICD-10-CM | POA: Diagnosis not present

## 2022-10-02 DIAGNOSIS — Z6828 Body mass index (BMI) 28.0-28.9, adult: Secondary | ICD-10-CM

## 2022-10-02 DIAGNOSIS — Z79891 Long term (current) use of opiate analgesic: Secondary | ICD-10-CM | POA: Diagnosis not present

## 2022-10-02 DIAGNOSIS — F411 Generalized anxiety disorder: Secondary | ICD-10-CM

## 2022-10-02 DIAGNOSIS — E1142 Type 2 diabetes mellitus with diabetic polyneuropathy: Secondary | ICD-10-CM | POA: Diagnosis not present

## 2022-10-02 DIAGNOSIS — E782 Mixed hyperlipidemia: Secondary | ICD-10-CM | POA: Diagnosis not present

## 2022-10-02 DIAGNOSIS — I10 Essential (primary) hypertension: Secondary | ICD-10-CM | POA: Diagnosis not present

## 2022-10-02 LAB — BAYER DCA HB A1C WAIVED: HB A1C (BAYER DCA - WAIVED): 7.7 % — ABNORMAL HIGH (ref 4.8–5.6)

## 2022-10-02 MED ORDER — PANTOPRAZOLE SODIUM 40 MG PO TBEC: 40.0000 mg | DELAYED_RELEASE_TABLET | Freq: Every day | ORAL | 1 refills | Status: DC

## 2022-10-02 MED ORDER — TRULICITY 3 MG/0.5ML ~~LOC~~ SOAJ: 3.0000 mg | SUBCUTANEOUS | 2 refills | Status: DC

## 2022-10-02 MED ORDER — LORAZEPAM 0.5 MG PO TABS: ORAL_TABLET | ORAL | 1 refills | Status: DC

## 2022-10-02 MED ORDER — FARXIGA 10 MG PO TABS: ORAL_TABLET | ORAL | 1 refills | Status: DC

## 2022-10-02 MED ORDER — GABAPENTIN 100 MG PO CAPS: 100.0000 mg | ORAL_CAPSULE | Freq: Every day | ORAL | 1 refills | Status: DC

## 2022-10-02 MED ORDER — GLIPIZIDE ER 10 MG PO TB24: 10.0000 mg | ORAL_TABLET | Freq: Every day | ORAL | 1 refills | Status: DC

## 2022-10-02 MED ORDER — ROSUVASTATIN CALCIUM 10 MG PO TABS: 10.0000 mg | ORAL_TABLET | ORAL | 1 refills | Status: DC

## 2022-10-02 NOTE — Patient Instructions (Signed)

## 2022-10-02 NOTE — Progress Notes (Signed)
Subjective:    Patient ID: Catherine Cooper, female    DOB: Aug 18, 1941, 81 y.o.   MRN: [id number]   Chief Complaint: medical management of chronic issues     HPI:  Catherine Cooper is a 81 y.o. who identifies as a female who was assigned female at birth.   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 Primary hypertension No c/o chest pain, sob or headache. Does check blood pressure at home several times a week. BP Readings from Last 3 Encounters:  06/24/22 130/75  03/24/22 109/60  12/19/21 111/64     2. Type 2 diabetes mellitus with diabetic polyneuropathy, without long-term current use of insulin (HCC) Fasting blood sugars are running around 120-160. No low blood sugars Lab Results  Component Value Date   HGBA1C 7.3 (H) 06/24/2022     3. Mixed hyperlipidemia Does try to watch diet, but does not dedicate dexercise. Lab Results  Component Value Date   CHOL 115 06/24/2022   HDL 54 06/24/2022   LDLCALC 43 06/24/2022   TRIG 98 06/24/2022   CHOLHDL 2.1 06/24/2022     4. Gastroesophageal reflux disease without esophagitis Is on protonix daily and is doing well.   5. GAD (generalized anxiety disorder) Is on ativan as needed    10/02/2022    9:56 AM 06/24/2022    8:42 AM 03/24/2022    8:50 AM 09/19/2021   10:37 AM  GAD 7 : Generalized Anxiety Score  Nervous, Anxious, on Edge 0 0 0 0  Control/stop worrying 0 0 0 0  Worry too much - different things 0 0 0 0  Trouble relaxing 0 0 0 0  Restless 0 0 0 0  Easily annoyed or irritable 0 0 0 0  Afraid - awful might happen 0 0 0 0  Total GAD 7 Score 0 0 0 0  Anxiety Difficulty Not difficult at all Not difficult at all Not difficult at all Not difficult at all      6. Functional diarrhea Is much better since she stopped taking metformin  7. BMI 28.0-28.9,adult Weight is down 3 lbs  Wt Readings from Last 3 Encounters:   10/02/22 149 lb (67.6 kg)  06/24/22 151 lb (68.5 kg)  05/30/22 150 lb (68 kg)   BMI Readings from Last 3 Encounters:  10/02/22 24.79 kg/m  06/24/22 25.13 kg/m  05/30/22 24.96 kg/m      New complaints: None today  Allergies  Allergen Reactions   Jardiance [Empagliflozin] Other (See Comments)    Legs eak   Lipitor [Atorvastatin]     fatique and myalgia   Penicillins Rash   Outpatient Encounter Medications as of 10/02/2022  Medication Sig   ALPHAGAN P 0.1 % SOLN Place 1 drop into both eyes 2 (two) times daily.    aspirin 81 MG tablet Take 81 mg by mouth every evening.    bifidobacterium infantis (ALIGN) capsule Take 1 capsule by mouth daily.   calcium citrate-vitamin D (CITRACAL+D) 315-200 MG-UNIT tablet Take 1 tablet by mouth 2 (two) times daily.   Cholecalciferol (VITAMIN D3) 2000 UNITS TABS Take 1 capsule by mouth daily.    Dulaglutide (TRULICITY) 3 ZJ/6.7HA SOPN Inject 3 mg as directed once a week.   FARXIGA 10 MG TABS tablet TAKE (1) TABLET DAILY IN THE MORNING.   gabapentin (NEURONTIN) 100 MG capsule Take 1 capsule (100 mg total) by mouth daily.  glipiZIDE (GLIPIZIDE XL) 10 MG 24 hr tablet Take 1 tablet (10 mg total) by mouth daily with breakfast.   ibuprofen (ADVIL,MOTRIN) 800 MG tablet Take 1 tablet (800 mg total) by mouth every 6 (six) hours as needed for pain.   latanoprost (XALATAN) 0.005 % ophthalmic solution Place 1 drop into both eyes at bedtime.    LORazepam (ATIVAN) 0.5 MG tablet TAKE 1 TABLET TWICE DAILY AS NEEDED FOR ANXIETY OR SLEEP   Multiple Vitamins-Minerals (PRESERVISION/LUTEIN PO) Take by mouth.   ONETOUCH ULTRA test strip TEST BLOOD SUGAR ONCE DAILYAND AS NEEDED Dx E11.9   pantoprazole (PROTONIX) 40 MG tablet Take 1 tablet (40 mg total) by mouth daily.   rosuvastatin (CRESTOR) 10 MG tablet Take 1 tablet (10 mg total) by mouth 3 (three) times a week.   SSD 1 % cream    vitamin B-12 (CYANOCOBALAMIN) 1000 MCG tablet Take 1,000 mcg by mouth daily.      No facility-administered encounter medications on file as of 10/02/2022.    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;  Surgeon: Newt Minion, MD;  Location: [facility];  Service: Orthopedics;  Laterality: Right;   COLONOSCOPY  2013   EYE SURGERY     FOOT SURGERY     Right foot / left heel   glaucome surgery right eye     skin cancer removed right side of nose     SKIN GRAFT Right 2007   foot   TUBAL LIGATION      Family History  Problem Relation Age of Onset   Dementia Father    Diabetes Father    Coronary artery disease Father 35   Hip fracture Father    Alzheimer's disease Father    Atrial fibrillation Mother    Osteoporosis Mother       Controlled substance contract: n/a     Review of Systems  Constitutional:  Negative for diaphoresis.  Eyes:  Negative for pain.  Respiratory:  Negative for shortness of breath.   Cardiovascular:  Negative for chest pain, palpitations and leg swelling.  Gastrointestinal:  Negative for abdominal pain.  Endocrine: Negative for polydipsia.  Skin:  Negative for rash.  Neurological:  Negative for dizziness, weakness and headaches.  Hematological:  Does not bruise/bleed easily.  All other systems reviewed and are negative.      Objective:   Physical Exam Vitals and nursing note reviewed.  Constitutional:      General: She is not in acute distress.    Appearance: Normal appearance. She is well-developed.  HENT:     Head: Normocephalic.     Right Ear: Tympanic membrane normal.     Left Ear: Tympanic membrane normal.     Nose: Nose normal.     Mouth/Throat:     Mouth: Mucous membranes are moist.  Eyes:      Pupils: Pupils are equal, round, and reactive to light.  Neck:     Vascular: No carotid bruit or JVD.  Cardiovascular:     Rate and Rhythm: Normal rate and regular rhythm.     Heart sounds: Normal heart sounds.  Pulmonary:     Effort: Pulmonary  effort is normal. No respiratory distress.     Breath sounds: Normal breath sounds. No wheezing or rales.  Chest:     Chest wall: No tenderness.  Abdominal:     General: Bowel sounds are normal. There is no distension or abdominal bruit.     Palpations: Abdomen is soft. There is no hepatomegaly, splenomegaly, mass or pulsatile mass.     Tenderness: There is no abdominal tenderness.  Musculoskeletal:        General: Normal range of motion.     Cervical back: Normal range of motion and neck supple.  Lymphadenopathy:     Cervical: No cervical adenopathy.  Skin:    General: Skin is warm and dry.  Neurological:     Mental Status: She is alert and oriented to person, place, and time.     Deep Tendon Reflexes: Reflexes are normal and symmetric.  Psychiatric:        Behavior: Behavior normal.        Thought Content: Thought content normal.        Judgment: Judgment normal.    BP (!) 148/70   Pulse 85   Temp 98.1 F (36.7 C) (Temporal)   Resp 20   Ht _0  (1.651 m)   Wt 149 lb (67.6 kg)   SpO2 97%   BMI 24.79 kg/m   Hgba1c 7.7%      Assessment & Plan:   NAYALI TALERICO comes in today with chief complaint of Medical Management of Chronic Issues (Needs referral to new dermatologist)   Diagnosis and orders addressed:  1. Primary hypertension Low sodium diet - CBC with Differential/Platelet - CMP14+EGFR  2. Type 2 diabetes mellitus with diabetic polyneuropathy, without long-term current use of insulin (HCC) Continue to watch carbs in diet - Bayer DCA Hb A1c Waived - Dulaglutide (TRULICITY) 3 YK/5.9DJ SOPN; Inject 3 mg as directed once a week.  Dispense: 6 mL; Refill: 2 - gabapentin (NEURONTIN) 100 MG capsule; Take 1  capsule (100 mg total) by mouth daily.  Dispense: 90 capsule; Refill: 1 - glipiZIDE (GLIPIZIDE XL) 10 MG 24 hr tablet; Take 1 tablet (10 mg total) by mouth daily with breakfast.  Dispense: 90 tablet; Refill: 1 - FARXIGA 10 MG TABS tablet; TAKE (1) TABLET DAILY IN THE MORNING.  Dispense: 90 tablet; Refill: 1  3. Mixed hyperlipidemia Low fat diet - Lipid panel - rosuvastatin (CRESTOR) 10 MG tablet; Take 1 tablet (10 mg total) by mouth 3 (three) times a week.  Dispense: 36 tablet; Refill: 1  4. Gastroesophageal reflux disease without esophagitis Avoid spicy foods Do not eat 2 hours prior to bedtime  - pantoprazole (PROTONIX) 40 MG tablet; Take 1 tablet (40 mg total) by mouth daily.  Dispense: 90 tablet; Refill: 1  5. GAD (generalized anxiety disorder) Stress management - ToxASSURE Select 13 (MW), Urine - Drug Screen 10 W/Conf, Se - LORazepam (ATIVAN) 0.5 MG tablet; TAKE 1 TABLET TWICE DAILY AS NEEDED FOR ANXIETY OR SLEEP  Dispense: 60 tablet; Refill: 1  6. Functional diarrhea  7. BMI 28.0-28.9,adult Discussed diet and exercise for person with BMI >25 Will recheck weight in 3-6 months   Labs pending Health Maintenance reviewed Diet and exercise encouraged  Follow up plan: 3 months    Mary-Margaret Hassell Done, FNP

## 2022-10-05 LAB — DRUG SCREEN 10 W/CONF, SERUM
Amphetamines, IA: NEGATIVE ng/mL
Barbiturates, IA: NEGATIVE ug/mL
Benzodiazepines, IA: NEGATIVE ng/mL
Cocaine & Metabolite, IA: NEGATIVE ng/mL
Methadone, IA: NEGATIVE ng/mL
Opiates, IA: NEGATIVE ng/mL
Oxycodones, IA: NEGATIVE ng/mL
Phencyclidine, IA: NEGATIVE ng/mL
Propoxyphene, IA: NEGATIVE ng/mL
THC(Marijuana) Metabolite, IA: NEGATIVE ng/mL

## 2022-10-05 LAB — LIPID PANEL
Chol/HDL Ratio: 2.3 ratio (ref 0.0–4.4)
Cholesterol, Total: 122 mg/dL (ref 100–199)
HDL: 54 mg/dL (ref >39)
LDL Chol Calc (NIH): 53 mg/dL (ref 0–99)
Triglycerides: 73 mg/dL (ref 0–149)
VLDL Cholesterol Cal: 15 mg/dL (ref 5–40)

## 2022-10-05 LAB — CBC WITH DIFFERENTIAL/PLATELET
Basophils Absolute: 0.1 10*3/uL (ref 0.0–0.2)
Basos: 1 %
EOS (ABSOLUTE): 0.4 10*3/uL (ref 0.0–0.4)
Eos: 8 %
Hematocrit: 39.6 % (ref 34.0–46.6)
Hemoglobin: 13.1 g/dL (ref 11.1–15.9)
Immature Grans (Abs): 0 10*3/uL (ref 0.0–0.1)
Immature Granulocytes: 0 %
Lymphocytes Absolute: 1.5 10*3/uL (ref 0.7–3.1)
Lymphs: 31 %
MCH: 31.1 pg (ref 26.6–33.0)
MCHC: 33.1 g/dL (ref 31.5–35.7)
MCV: 94 fL (ref 79–97)
Monocytes Absolute: 0.4 10*3/uL (ref 0.1–0.9)
Monocytes: 8 %
Neutrophils Absolute: 2.6 10*3/uL (ref 1.4–7.0)
Neutrophils: 52 %
Platelets: 156 10*3/uL (ref 150–450)
RBC: 4.21 x10E6/uL (ref 3.77–5.28)
RDW: 11.8 % (ref 11.7–15.4)
WBC: 4.9 10*3/uL (ref 3.4–10.8)

## 2022-10-05 LAB — CMP14+EGFR
ALT: 16 IU/L (ref 0–32)
AST: 16 IU/L (ref 0–40)
Albumin/Globulin Ratio: 1.8 (ref 1.2–2.2)
Albumin: 4.3 g/dL (ref 3.7–4.7)
Alkaline Phosphatase: 83 IU/L (ref 44–121)
BUN/Creatinine Ratio: 13 (ref 12–28)
BUN: 12 mg/dL (ref 8–27)
Bilirubin Total: 0.6 mg/dL (ref 0.0–1.2)
CO2: 24 mmol/L (ref 20–29)
Calcium: 9.4 mg/dL (ref 8.7–10.3)
Chloride: 105 mmol/L (ref 96–106)
Creatinine, Ser: 0.92 mg/dL (ref 0.57–1.00)
Globulin, Total: 2.4 g/dL (ref 1.5–4.5)
Glucose: 121 mg/dL — ABNORMAL HIGH (ref 70–99)
Potassium: 3.9 mmol/L (ref 3.5–5.2)
Sodium: 143 mmol/L (ref 134–144)
Total Protein: 6.7 g/dL (ref 6.0–8.5)
eGFR: 62 mL/min/{1.73_m2} (ref >59)

## 2022-10-14 ENCOUNTER — Other Ambulatory Visit: Payer: Self-pay | Admitting: Nurse Practitioner

## 2022-10-14 DIAGNOSIS — Z1231 Encounter for screening mammogram for malignant neoplasm of breast: Secondary | ICD-10-CM

## 2022-10-30 DIAGNOSIS — B351 Tinea unguium: Secondary | ICD-10-CM | POA: Diagnosis not present

## 2022-10-30 DIAGNOSIS — M79676 Pain in unspecified toe(s): Secondary | ICD-10-CM | POA: Diagnosis not present

## 2022-10-30 DIAGNOSIS — E1142 Type 2 diabetes mellitus with diabetic polyneuropathy: Secondary | ICD-10-CM | POA: Diagnosis not present

## 2022-10-30 DIAGNOSIS — L84 Corns and callosities: Secondary | ICD-10-CM | POA: Diagnosis not present

## 2022-11-10 ENCOUNTER — Ambulatory Visit
Admission: RE | Admit: 2022-11-10 | Discharge: 2022-11-10 | Disposition: A | Discharge status: Home / Self Care | Payer: Medicare Other | Source: Ambulatory Visit | Attending: Nurse Practitioner | Admitting: Nurse Practitioner

## 2022-11-10 DIAGNOSIS — Z1231 Encounter for screening mammogram for malignant neoplasm of breast: Secondary | ICD-10-CM | POA: Diagnosis not present

## 2022-11-10 HISTORY — DX: Malignant neoplasm of unspecified site of unspecified female breast: C50.919

## 2023-01-01 ENCOUNTER — Encounter: Payer: Self-pay | Admitting: Nurse Practitioner

## 2023-01-01 ENCOUNTER — Ambulatory Visit (INDEPENDENT_AMBULATORY_CARE_PROVIDER_SITE_OTHER): Payer: Medicare Other | Admitting: Nurse Practitioner

## 2023-01-01 ENCOUNTER — Ambulatory Visit (INDEPENDENT_AMBULATORY_CARE_PROVIDER_SITE_OTHER): Payer: Medicare Other

## 2023-01-01 VITALS — BP 137/76 | HR 80 | Temp 97.5°F | Resp 20 | Ht 65.0 in | Wt 150.0 lb

## 2023-01-01 DIAGNOSIS — M25562 Pain in left knee: Secondary | ICD-10-CM

## 2023-01-01 DIAGNOSIS — I10 Essential (primary) hypertension: Secondary | ICD-10-CM

## 2023-01-01 DIAGNOSIS — K591 Functional diarrhea: Secondary | ICD-10-CM | POA: Diagnosis not present

## 2023-01-01 DIAGNOSIS — N1831 Chronic kidney disease, stage 3a: Secondary | ICD-10-CM

## 2023-01-01 DIAGNOSIS — Z0001 Encounter for general adult medical examination with abnormal findings: Secondary | ICD-10-CM

## 2023-01-01 DIAGNOSIS — M810 Age-related osteoporosis without current pathological fracture: Secondary | ICD-10-CM | POA: Diagnosis not present

## 2023-01-01 DIAGNOSIS — E785 Hyperlipidemia, unspecified: Secondary | ICD-10-CM

## 2023-01-01 DIAGNOSIS — E1169 Type 2 diabetes mellitus with other specified complication: Secondary | ICD-10-CM | POA: Diagnosis not present

## 2023-01-01 DIAGNOSIS — Z0000 Encounter for general adult medical examination without abnormal findings: Secondary | ICD-10-CM

## 2023-01-01 DIAGNOSIS — Z6828 Body mass index (BMI) 28.0-28.9, adult: Secondary | ICD-10-CM

## 2023-01-01 DIAGNOSIS — E1142 Type 2 diabetes mellitus with diabetic polyneuropathy: Secondary | ICD-10-CM | POA: Diagnosis not present

## 2023-01-01 DIAGNOSIS — Z0389 Encounter for observation for other suspected diseases and conditions ruled out: Secondary | ICD-10-CM | POA: Diagnosis not present

## 2023-01-01 DIAGNOSIS — F411 Generalized anxiety disorder: Secondary | ICD-10-CM

## 2023-01-01 LAB — BAYER DCA HB A1C WAIVED: HB A1C (BAYER DCA - WAIVED): 7.1 % — ABNORMAL HIGH (ref 4.8–5.6)

## 2023-01-01 MED ORDER — LORAZEPAM 0.5 MG PO TABS: ORAL_TABLET | ORAL | 1 refills | Status: DC

## 2023-01-01 NOTE — Patient Instructions (Signed)
Acute Knee Pain, Adult Many things can cause knee pain. Sometimes, knee pain is sudden (acute) and may be caused by damage, swelling, or irritation of the muscles and tissues that support your knee. The pain often goes away on its own with time and rest. If the pain does not go away, tests may be done to find out what is causing the pain. Follow these instructions at home: If you have a knee sleeve or brace:  Wear the knee sleeve or brace as told by your doctor. Take it off only as told by your doctor. Loosen it if your toes: Tingle. Become numb. Turn cold and blue. Keep it clean. If the knee sleeve or brace is not waterproof: Do not let it get wet. Cover it with a watertight covering when you take a bath or shower. Activity Rest your knee. Do not do things that cause pain or make pain worse. Avoid activities where both feet leave the ground at the same time (high-impact activities). Examples are running, jumping rope, and doing jumping jacks. Work with a physical therapist to make a safe exercise program, as told by your doctor. Managing pain, stiffness, and swelling  If told, put ice on the knee. To do this: If you have a removable knee sleeve or brace, take it off as told by your doctor. Put ice in a plastic bag. Place a towel between your skin and the bag. Leave the ice on for 20 minutes, 2-3 times a day. Take off the ice if your skin turns bright red. This is very important. If you cannot feel pain, heat, or cold, you have a greater risk of damage to the area. If told, use an elastic bandage to put pressure (compression) on your injured knee. Raise your knee above the level of your heart while you are sitting or lying down. Sleep with a pillow under your knee. General instructions Take over-the-counter and prescription medicines only as told by your doctor. Do not smoke or use any products that contain nicotine or tobacco. If you need help quitting, ask your doctor. If you are  overweight, work with your doctor and a food expert (dietitian) to set goals to lose weight. Being overweight can make your knee hurt more. Watch for any changes in your symptoms. Keep all follow-up visits. Contact a doctor if: The knee pain does not stop. The knee pain changes or gets worse. You have a fever along with knee pain. Your knee is red or feels warm when you touch it. Your knee gives out or locks up. Get help right away if: Your knee swells, and the swelling gets worse. You cannot move your knee. You have very bad knee pain that does not get better with pain medicine. Summary Many things can cause knee pain. The pain often goes away on its own with time and rest. Your doctor may do tests to find out the cause of the pain. Watch for any changes in your symptoms. Relieve your pain with rest, medicines, light activity, and use of ice. Get help right away if you cannot move your knee or your knee pain is very bad. This information is not intended to replace advice given to you by your health care provider. Make sure you discuss any questions you have with your health care provider. Document Revised: 03/14/2020 Document Reviewed: 03/14/2020 Elsevier Patient Education  2023 Elsevier Inc.  

## 2023-01-01 NOTE — Progress Notes (Signed)
150's  Subjective:    Patient ID: Catherine Cooper, female    DOB: July 05, 1941, 82 y.o.   MRN: [id number]   Chief Complaint: annual physical   HPI:  Catherine Cooper is a 82 y.o. who identifies as a female who was assigned female at birth.   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 Annual physical exam No pap  2. Primary hypertension No c/o chest pain, sob or headache. Does not check blood pressure at home. BP Readings from Last 3 Encounters:  10/02/22 (!) 148/70  06/24/22 130/75  03/24/22 109/60     3. Type 2 diabetes mellitus with diabetic polyneuropathy, without long-term current use of insulin (HCC) Fasting blood sugars are running 150's. She tries to watch her diet. Lab Results  Component Value Date   HGBA1C 7.7 (H) 10/02/2022     4. Hyperlipidemia associated with type 2 diabetes mellitus ([place]) Does try to watch diet but very seldom does nay exercise. Lab Results  Component Value Date   CHOL 122 10/02/2022   HDL 54 10/02/2022   LDLCALC 53 10/02/2022   TRIG 73 10/02/2022   CHOLHDL 2.3 10/02/2022     5. Stage 3a chronic kidney disease (HCC) No voiding issues Lab Results  Component Value Date   CREATININE 0.92 10/02/2022     6. Functional diarrhea Has daily. Takes imodium which seems to help  7. GAD (generalized anxiety disorder) Is on ativan as needed. Some days she does not take at all.  8. Age-related osteoporosis without current pathological fracture Last dexascan was done on 09/11/20. She doe snot want to do anymore dueto her age.  9. BMI 28.0-28.9,adult No recent weight changes Wt Readings from Last 3 Encounters:  01/01/23 150 lb (68 kg)  10/02/22 149 lb (67.6 kg)  06/24/22 151 lb (68.5 kg)   BMI Readings from Last 3 Encounters:  01/01/23 24.96 kg/m  10/02/22 24.79 kg/m  06/24/22 25.13 kg/m      New complaints: Left knee pain. Rates  pain 8-9/10 when up walking. Has seen ortho in past.   Allergies  Allergen Reactions   Jardiance [Empagliflozin] Other (See Comments)    Legs eak   Lipitor [Atorvastatin]     fatique and myalgia   Penicillins Rash   Outpatient Encounter Medications as of 01/01/2023  Medication Sig   ALPHAGAN P 0.1 % SOLN Place 1 drop into both eyes 2 (two) times daily.    aspirin 81 MG tablet Take 81 mg by mouth every evening.    bifidobacterium infantis (ALIGN) capsule Take 1 capsule by mouth daily.   calcium citrate-vitamin D (CITRACAL+D) 315-200 MG-UNIT tablet Take 1 tablet by mouth 2 (two) times daily.   Cholecalciferol (VITAMIN D3) 2000 UNITS TABS Take 1 capsule by mouth daily.    Dulaglutide (TRULICITY) 3 0000000 SOPN Inject 3 mg as directed once a week.   FARXIGA 10 MG TABS tablet TAKE (1) TABLET DAILY IN THE MORNING.   gabapentin (NEURONTIN) 100 MG capsule Take 1 capsule (100 mg total) by mouth daily.   glipiZIDE (GLIPIZIDE XL) 10 MG 24 hr tablet Take 1 tablet (10 mg total) by mouth daily with breakfast.   ibuprofen (ADVIL,MOTRIN) 800 MG tablet Take 1 tablet (800 mg total) by mouth every 6 (six) hours as needed for pain.   latanoprost (XALATAN) 0.005 % ophthalmic solution Place 1 drop into both eyes at bedtime.  LORazepam (ATIVAN) 0.5 MG tablet TAKE 1 TABLET TWICE DAILY AS NEEDED FOR ANXIETY OR SLEEP   Multiple Vitamins-Minerals (PRESERVISION/LUTEIN PO) Take by mouth.   ONETOUCH ULTRA test strip TEST BLOOD SUGAR ONCE DAILYAND AS NEEDED Dx E11.9   pantoprazole (PROTONIX) 40 MG tablet Take 1 tablet (40 mg total) by mouth daily.   rosuvastatin (CRESTOR) 10 MG tablet Take 1 tablet (10 mg total) by mouth 3 (three) times a week.   SSD 1 % cream    vitamin B-12 (CYANOCOBALAMIN) 1000 MCG tablet Take 1,000 mcg by mouth daily.     No facility-administered encounter medications on file as of 01/01/2023.    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;  Surgeon: Newt Minion, MD;  Location: [facility];  Service: Orthopedics;  Laterality: Right;   COLONOSCOPY  2013   EYE SURGERY     FOOT SURGERY     Right foot / left heel   glaucome surgery right eye     skin cancer removed right side of nose     SKIN GRAFT Right 2007   foot   TUBAL LIGATION      Family History  Problem Relation Age of Onset   Dementia Father    Diabetes Father    Coronary artery disease Father 41   Hip fracture Father    Alzheimer's disease Father    Atrial fibrillation Mother    Osteoporosis Mother       Controlled substance contract: 10/08/22     Review of Systems  Constitutional:  Negative for diaphoresis.  Eyes:  Negative for pain.  Respiratory:  Negative for shortness of breath.   Cardiovascular:  Negative for chest pain, palpitations and leg swelling.  Gastrointestinal:  Negative for abdominal pain.  Endocrine: Negative for polydipsia.  Musculoskeletal:  Positive for arthralgias (left knee).  Skin:  Negative for rash.  Neurological:  Negative for dizziness, weakness and headaches.  Hematological:  Does not bruise/bleed easily.  All other systems reviewed and are negative.      Objective:   Physical Exam Vitals and nursing note reviewed.  Constitutional:      General: She is not in acute distress.    Appearance: Normal appearance. She is well-developed.  HENT:     Head: Normocephalic.     Right Ear: Tympanic membrane normal.     Left Ear: Tympanic membrane normal.     Nose: Nose normal.     Mouth/Throat:     Mouth: Mucous membranes are moist.  Eyes:     Pupils: Pupils are equal, round, and reactive to light.  Neck:     Vascular: No carotid bruit or JVD.   Cardiovascular:     Rate and Rhythm: Normal rate and regular rhythm.     Heart sounds: Normal heart sounds.  Pulmonary:     Effort: Pulmonary effort is normal. No respiratory distress.     Breath sounds: Normal breath sounds. No wheezing or rales.  Chest:     Chest wall: No tenderness.  Abdominal:     General: Bowel sounds are normal. There is no distension or abdominal bruit.     Palpations: Abdomen  is soft. There is no hepatomegaly, splenomegaly, mass or pulsatile mass.     Tenderness: There is no abdominal tenderness.  Musculoskeletal:        General: Normal range of motion.     Cervical back: Normal range of motion and neck supple.  Lymphadenopathy:     Cervical: No cervical adenopathy.  Skin:    General: Skin is warm and dry.  Neurological:     Mental Status: She is alert and oriented to person, place, and time.     Deep Tendon Reflexes: Reflexes are normal and symmetric.  Psychiatric:        Behavior: Behavior normal.        Thought Content: Thought content normal.        Judgment: Judgment normal.    BP 137/76   Pulse 80   Temp (!) 97.5 F (36.4 C) (Temporal)   Resp 20   Ht 5\' 5"  (1.651 m)   Wt 150 lb (68 kg)   SpO2 100%   BMI 24.96 kg/m   HGBA1c 7.1%  Chest xray- no acute or chronic issues-Preliminary reading by Ronnald Collum, FNP  [facility]  EKG- NSR- Mary-Margaret Hassell Done, FNP        Assessment & Plan:  Catherine Cooper comes in today with chief complaint of Annual Exam   Diagnosis and orders addressed:  1. Annual physical exam  2. Primary hypertension Low sodium diet - DG Chest 2 View - EKG 12-Lead - CBC with Differential/Platelet - CMP14+EGFR  3. Type 2 diabetes mellitus with diabetic polyneuropathy, without long-term current use of insulin (HCC) Continue to watch carbs in diet - Bayer DCA Hb A1c Waived  4. Hyperlipidemia associated with type 2 diabetes mellitus (HCC) Low fat diet - Lipid panel  5. Stage 3a chronic kidney disease  ([place]) Labs pending  6. Functional diarrhea Imodium as needed  7. GAD (generalized anxiety disorder) Stress management - LORazepam (ATIVAN) 0.5 MG tablet; TAKE 1 TABLET TWICE DAILY AS NEEDED FOR ANXIETY OR SLEEP  Dispense: 60 tablet; Refill: 1  8. Age-related osteoporosis without current pathological fracture No longer wants to do dexascans  9. BMI 28.0-28.9,adult Discussed diet and exercise for person with BMI >25 Will recheck weight in 3-6 months   10. Acute pain of left knee Continue tylenol OTC Ace wrap - Ambulatory referral to Orthopedic Surgery   Labs pending Health Maintenance reviewed Diet and exercise encouraged  Follow up plan: 3 months   Mary-Margaret Hassell Done, FNP

## 2023-01-02 LAB — CBC WITH DIFFERENTIAL/PLATELET
Basophils Absolute: 0.1 10*3/uL (ref 0.0–0.2)
Basos: 1 %
EOS (ABSOLUTE): 0.4 10*3/uL (ref 0.0–0.4)
Eos: 8 %
Hematocrit: 41.2 % (ref 34.0–46.6)
Hemoglobin: 13.5 g/dL (ref 11.1–15.9)
Immature Grans (Abs): 0 10*3/uL (ref 0.0–0.1)
Immature Granulocytes: 0 %
Lymphocytes Absolute: 1.7 10*3/uL (ref 0.7–3.1)
Lymphs: 31 %
MCH: 31.3 pg (ref 26.6–33.0)
MCHC: 32.8 g/dL (ref 31.5–35.7)
MCV: 96 fL (ref 79–97)
Monocytes Absolute: 0.4 10*3/uL (ref 0.1–0.9)
Monocytes: 7 %
Neutrophils Absolute: 2.9 10*3/uL (ref 1.4–7.0)
Neutrophils: 53 %
Platelets: 155 10*3/uL (ref 150–450)
RBC: 4.31 x10E6/uL (ref 3.77–5.28)
RDW: 11.9 % (ref 11.7–15.4)
WBC: 5.6 10*3/uL (ref 3.4–10.8)

## 2023-01-02 LAB — CMP14+EGFR
ALT: 16 IU/L (ref 0–32)
AST: 16 IU/L (ref 0–40)
Albumin/Globulin Ratio: 1.8 (ref 1.2–2.2)
Albumin: 4.3 g/dL (ref 3.7–4.7)
Alkaline Phosphatase: 92 IU/L (ref 44–121)
BUN/Creatinine Ratio: 18 (ref 12–28)
BUN: 16 mg/dL (ref 8–27)
Bilirubin Total: 0.5 mg/dL (ref 0.0–1.2)
CO2: 25 mmol/L (ref 20–29)
Calcium: 9.5 mg/dL (ref 8.7–10.3)
Chloride: 106 mmol/L (ref 96–106)
Creatinine, Ser: 0.88 mg/dL (ref 0.57–1.00)
Globulin, Total: 2.4 g/dL (ref 1.5–4.5)
Glucose: 131 mg/dL — ABNORMAL HIGH (ref 70–99)
Potassium: 5.3 mmol/L — ABNORMAL HIGH (ref 3.5–5.2)
Sodium: 145 mmol/L — ABNORMAL HIGH (ref 134–144)
Total Protein: 6.7 g/dL (ref 6.0–8.5)
eGFR: 66 mL/min/{1.73_m2} (ref >59)

## 2023-01-02 LAB — LIPID PANEL
Chol/HDL Ratio: 2 ratio (ref 0.0–4.4)
Cholesterol, Total: 116 mg/dL (ref 100–199)
HDL: 59 mg/dL (ref >39)
LDL Chol Calc (NIH): 44 mg/dL (ref 0–99)
Triglycerides: 60 mg/dL (ref 0–149)
VLDL Cholesterol Cal: 13 mg/dL (ref 5–40)

## 2023-01-08 DIAGNOSIS — M79676 Pain in unspecified toe(s): Secondary | ICD-10-CM | POA: Diagnosis not present

## 2023-01-08 DIAGNOSIS — L84 Corns and callosities: Secondary | ICD-10-CM | POA: Diagnosis not present

## 2023-01-08 DIAGNOSIS — E1142 Type 2 diabetes mellitus with diabetic polyneuropathy: Secondary | ICD-10-CM | POA: Diagnosis not present

## 2023-01-08 DIAGNOSIS — B351 Tinea unguium: Secondary | ICD-10-CM | POA: Diagnosis not present

## 2023-01-15 ENCOUNTER — Ambulatory Visit (INDEPENDENT_AMBULATORY_CARE_PROVIDER_SITE_OTHER): Payer: Medicare Other | Admitting: Orthopedic Surgery

## 2023-01-15 DIAGNOSIS — M1712 Unilateral primary osteoarthritis, left knee: Secondary | ICD-10-CM | POA: Diagnosis not present

## 2023-01-18 ENCOUNTER — Encounter: Payer: Self-pay | Admitting: Orthopedic Surgery

## 2023-01-18 DIAGNOSIS — M1712 Unilateral primary osteoarthritis, left knee: Secondary | ICD-10-CM

## 2023-01-18 MED ORDER — METHYLPREDNISOLONE ACETATE 40 MG/ML IJ SUSP
40.0000 mg | INTRAMUSCULAR | Status: AC | PRN
  Administered 2023-01-18: 40 mg via INTRA_ARTICULAR

## 2023-01-18 MED ORDER — LIDOCAINE HCL (PF) 1 % IJ SOLN
5.0000 mL | INTRAMUSCULAR | Status: AC | PRN
  Administered 2023-01-18: 5 mL

## 2023-01-18 NOTE — Progress Notes (Signed)
Office Visit Note   Patient: Catherine Cooper           Date of Birth: Sep 19, 1941           MRN: [id number] Visit Date: 01/15/2023              Requested by: Bennie Pierini, FNP [address] [place],  [place] [address] PCP: Bennie Pierini, FNP  Chief Complaint  Patient presents with   Left Knee - Pain      HPI: Patient is an 82 year old woman with osteoarthritis of her left knee.  Patient feels popping in her knee she states she has difficulty getting from a sitting to a standing position.  Assessment & Plan: Visit Diagnoses:  1. Unilateral primary osteoarthritis, left knee     Plan: Left knee was injected she tolerated this well  Follow-Up Instructions: Return if symptoms worsen or fail to improve.   Ortho Exam  Patient is alert, oriented, no adenopathy, well-dressed, normal affect, normal respiratory effort. Examination patient ambulates with a cane with an antalgic gait.  There is no effusion.  There is crepitation with range of motion collaterals and cruciates are stable.  Imaging: No results found. No images are attached to the encounter.  Labs: Lab Results  Component Value Date   HGBA1C 7.1 (H) 01/01/2023   HGBA1C 7.7 (H) 10/02/2022   HGBA1C 7.3 (H) 06/24/2022   ESRSEDRATE 3 05/20/2013   LABURIC 4.6 05/20/2013     Lab Results  Component Value Date   ALBUMIN 4.3 01/01/2023   ALBUMIN 4.3 10/02/2022   ALBUMIN 4.2 06/24/2022    No results found for: "MG" Lab Results  Component Value Date   VD25OH 51.5 06/19/2021    No results found for: "PREALBUMIN"    Latest Ref Rng & Units 01/01/2023   10:30 AM 10/02/2022   10:09 AM 06/24/2022    8:32 AM  CBC EXTENDED  WBC 3.4 - 10.8 x10E3/uL 5.6  4.9  8.0   RBC 3.77 - 5.28 x10E6/uL 4.31  4.21  4.34   Hemoglobin 11.1 - 15.9 g/dL 01.7  79.3  90.3   HCT 34.0 - 46.6 % 41.2  39.6  40.9   Platelets 150 - 450 x10E3/uL 155  156  169   NEUT# 1.4 - 7.0 x10E3/uL 2.9  2.6  4.6   Lymph# 0.7 - 3.1  x10E3/uL 1.7  1.5  2.2      There is no height or weight on file to calculate BMI.  Orders:  No orders of the defined types were placed in this encounter.  No orders of the defined types were placed in this encounter.    Procedures: Large Joint Inj: L knee on 01/18/2023 2:54 PM Indications: pain and diagnostic evaluation Details: 22 G 1.5 in needle, anteromedial approach  Arthrogram: No  Medications: 5 mL lidocaine (PF) 1 %; 40 mg methylPREDNISolone acetate 40 MG/ML Outcome: tolerated well, no immediate complications Procedure, treatment alternatives, risks and benefits explained, specific risks discussed. Consent was given by the patient. Immediately prior to procedure a time out was called to verify the correct patient, procedure, equipment, support staff and site/side marked as required. Patient was prepped and draped in the usual sterile fashion.      Clinical Data: No additional findings.  ROS:  All other systems negative, except as noted in the HPI. Review of Systems  Objective: Vital Signs: There were no vitals taken for this visit.  Specialty Comments:  No specialty comments available.  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 08/15/2020   Gastroesophageal reflux disease without esophagitis 12/12/2015   Chronic kidney disease, stage III (moderate) 12/12/2015   Diarrhea 06/04/2015   BMI 28.0-28.9,adult 05/09/2015   Hyperlipidemia associated with type 2 diabetes mellitus 03/21/2013   History of endometrial cancer 12/04/2011   HTN (hypertension) 09/16/2011   Type 2 diabetes mellitus with diabetic polyneuropathy, without long-term current use of insulin 01/11/2011   Osteoporosis 01/11/2011    Glaucoma 01/11/2011   GAD (generalized anxiety disorder) 01/11/2011   Past Medical History:  Diagnosis Date   Achilles rupture, left    Anxiety    Breast cancer    Cataracts, bilateral    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    left foot   Diabetes mellitus    type II   Diabetic neuropathy    Diarrhea    attributed to diabetic meds   Endometrioid adenocarcinoma    GAD (generalized anxiety disorder)    GERD (gastroesophageal reflux disease)    Glaucoma    Hyperlipidemia    not on medications   Iron deficiency    Nephrolithiasis 2000   Neuropathy    both feet   Osteoporosis    Shingles 2004   Squamous cell carcinoma of skin 03/20/2020   dorsum of nose   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   Right Upper Nose tx [id number]    Family History  Problem Relation Age of Onset   Dementia Father    Diabetes Father    Coronary artery disease Father 70   Hip fracture Father    Alzheimer's disease Father    Atrial fibrillation Mother    Osteoporosis Mother     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;  Surgeon: Nadara Mustard, MD;  Location: [facility];  Service: Orthopedics;  Laterality: Right;   COLONOSCOPY  2013   EYE SURGERY     FOOT SURGERY     Right foot / left heel   glaucome surgery right eye     skin cancer removed right side of nose     SKIN GRAFT Right 2007   foot   TUBAL LIGATION     Social History   Occupational History   Occupation: retired    Comment: Scientific laboratory technician of [employer]  Tobacco Use   Smoking status: Never   Smokeless tobacco: Never  Vaping Use   Vaping Use: Never used   Substance and Sexual Activity   Alcohol use: No   Drug use: No   Sexual activity: Not on file

## 2023-02-26 ENCOUNTER — Other Ambulatory Visit: Payer: Self-pay | Admitting: Nurse Practitioner

## 2023-02-26 DIAGNOSIS — E1142 Type 2 diabetes mellitus with diabetic polyneuropathy: Secondary | ICD-10-CM

## 2023-03-19 DIAGNOSIS — L84 Corns and callosities: Secondary | ICD-10-CM | POA: Diagnosis not present

## 2023-03-19 DIAGNOSIS — M79676 Pain in unspecified toe(s): Secondary | ICD-10-CM | POA: Diagnosis not present

## 2023-03-19 DIAGNOSIS — B351 Tinea unguium: Secondary | ICD-10-CM | POA: Diagnosis not present

## 2023-03-19 DIAGNOSIS — E1142 Type 2 diabetes mellitus with diabetic polyneuropathy: Secondary | ICD-10-CM | POA: Diagnosis not present

## 2023-04-02 ENCOUNTER — Ambulatory Visit: Payer: BC Managed Care – PPO | Admitting: Nurse Practitioner

## 2023-04-02 DIAGNOSIS — H401221 Low-tension glaucoma, left eye, mild stage: Secondary | ICD-10-CM | POA: Diagnosis not present

## 2023-04-02 DIAGNOSIS — H353132 Nonexudative age-related macular degeneration, bilateral, intermediate dry stage: Secondary | ICD-10-CM | POA: Diagnosis not present

## 2023-04-02 DIAGNOSIS — H401212 Low-tension glaucoma, right eye, moderate stage: Secondary | ICD-10-CM | POA: Diagnosis not present

## 2023-04-02 DIAGNOSIS — E113392 Type 2 diabetes mellitus with moderate nonproliferative diabetic retinopathy without macular edema, left eye: Secondary | ICD-10-CM | POA: Diagnosis not present

## 2023-04-02 LAB — HM DIABETES EYE EXAM

## 2023-04-07 ENCOUNTER — Ambulatory Visit (INDEPENDENT_AMBULATORY_CARE_PROVIDER_SITE_OTHER): Payer: Medicare Other | Admitting: Nurse Practitioner

## 2023-04-07 ENCOUNTER — Encounter: Payer: Self-pay | Admitting: Nurse Practitioner

## 2023-04-07 VITALS — BP 127/73 | HR 87 | Temp 97.8°F | Resp 20 | Ht 65.0 in | Wt 145.0 lb

## 2023-04-07 DIAGNOSIS — K219 Gastro-esophageal reflux disease without esophagitis: Secondary | ICD-10-CM | POA: Diagnosis not present

## 2023-04-07 DIAGNOSIS — I129 Hypertensive chronic kidney disease with stage 1 through stage 4 chronic kidney disease, or unspecified chronic kidney disease: Secondary | ICD-10-CM | POA: Diagnosis not present

## 2023-04-07 DIAGNOSIS — Z7984 Long term (current) use of oral hypoglycemic drugs: Secondary | ICD-10-CM

## 2023-04-07 DIAGNOSIS — K591 Functional diarrhea: Secondary | ICD-10-CM

## 2023-04-07 DIAGNOSIS — I10 Essential (primary) hypertension: Secondary | ICD-10-CM | POA: Diagnosis not present

## 2023-04-07 DIAGNOSIS — N1831 Chronic kidney disease, stage 3a: Secondary | ICD-10-CM | POA: Diagnosis not present

## 2023-04-07 DIAGNOSIS — E1169 Type 2 diabetes mellitus with other specified complication: Secondary | ICD-10-CM | POA: Diagnosis not present

## 2023-04-07 DIAGNOSIS — M810 Age-related osteoporosis without current pathological fracture: Secondary | ICD-10-CM | POA: Diagnosis not present

## 2023-04-07 DIAGNOSIS — E1122 Type 2 diabetes mellitus with diabetic chronic kidney disease: Secondary | ICD-10-CM | POA: Diagnosis not present

## 2023-04-07 DIAGNOSIS — Z6828 Body mass index (BMI) 28.0-28.9, adult: Secondary | ICD-10-CM

## 2023-04-07 DIAGNOSIS — E785 Hyperlipidemia, unspecified: Secondary | ICD-10-CM

## 2023-04-07 DIAGNOSIS — E1142 Type 2 diabetes mellitus with diabetic polyneuropathy: Secondary | ICD-10-CM

## 2023-04-07 DIAGNOSIS — F411 Generalized anxiety disorder: Secondary | ICD-10-CM

## 2023-04-07 LAB — CMP14+EGFR
ALT: 13 IU/L (ref 0–32)
AST: 14 IU/L (ref 0–40)
Albumin: 3.8 g/dL (ref 3.7–4.7)
Alkaline Phosphatase: 79 IU/L (ref 44–121)
BUN/Creatinine Ratio: 12 (ref 12–28)
BUN: 11 mg/dL (ref 8–27)
Bilirubin Total: 0.5 mg/dL (ref 0.0–1.2)
CO2: 24 mmol/L (ref 20–29)
Calcium: 8.7 mg/dL (ref 8.7–10.3)
Chloride: 108 mmol/L — ABNORMAL HIGH (ref 96–106)
Creatinine, Ser: 0.89 mg/dL (ref 0.57–1.00)
Globulin, Total: 2.4 g/dL (ref 1.5–4.5)
Glucose: 123 mg/dL — ABNORMAL HIGH (ref 70–99)
Potassium: 4 mmol/L (ref 3.5–5.2)
Sodium: 144 mmol/L (ref 134–144)
Total Protein: 6.2 g/dL (ref 6.0–8.5)
eGFR: 65 mL/min/{1.73_m2} (ref >59)

## 2023-04-07 LAB — CBC WITH DIFFERENTIAL/PLATELET
Basophils Absolute: 0.1 10*3/uL (ref 0.0–0.2)
Basos: 1 %
EOS (ABSOLUTE): 0.4 10*3/uL (ref 0.0–0.4)
Eos: 6 %
Hematocrit: 38.5 % (ref 34.0–46.6)
Hemoglobin: 13 g/dL (ref 11.1–15.9)
Immature Grans (Abs): 0 10*3/uL (ref 0.0–0.1)
Immature Granulocytes: 0 %
Lymphocytes Absolute: 1.7 10*3/uL (ref 0.7–3.1)
Lymphs: 26 %
MCH: 32.3 pg (ref 26.6–33.0)
MCHC: 33.8 g/dL (ref 31.5–35.7)
MCV: 96 fL (ref 79–97)
Monocytes Absolute: 0.6 10*3/uL (ref 0.1–0.9)
Monocytes: 9 %
Neutrophils Absolute: 3.7 10*3/uL (ref 1.4–7.0)
Neutrophils: 58 %
Platelets: 151 10*3/uL (ref 150–450)
RBC: 4.03 x10E6/uL (ref 3.77–5.28)
RDW: 11.8 % (ref 11.7–15.4)
WBC: 6.5 10*3/uL (ref 3.4–10.8)

## 2023-04-07 LAB — LIPID PANEL
Chol/HDL Ratio: 1.9 ratio (ref 0.0–4.4)
Cholesterol, Total: 101 mg/dL (ref 100–199)
HDL: 53 mg/dL (ref >39)
LDL Chol Calc (NIH): 34 mg/dL (ref 0–99)
Triglycerides: 62 mg/dL (ref 0–149)
VLDL Cholesterol Cal: 14 mg/dL (ref 5–40)

## 2023-04-07 LAB — BAYER DCA HB A1C WAIVED: HB A1C (BAYER DCA - WAIVED): 7.2 % — ABNORMAL HIGH (ref 4.8–5.6)

## 2023-04-07 MED ORDER — FARXIGA 10 MG PO TABS
ORAL_TABLET | ORAL | 1 refills | Status: DC
Start: 2023-04-07 — End: 2023-10-01

## 2023-04-07 MED ORDER — LORAZEPAM 0.5 MG PO TABS: ORAL_TABLET | ORAL | 5 refills | Status: DC

## 2023-04-07 MED ORDER — TRULICITY 3 MG/0.5ML ~~LOC~~ SOAJ
3.0000 mg | SUBCUTANEOUS | 5 refills | Status: DC
Start: 2023-04-07 — End: 2024-04-01

## 2023-04-07 MED ORDER — PANTOPRAZOLE SODIUM 40 MG PO TBEC: 40.0000 mg | DELAYED_RELEASE_TABLET | Freq: Every day | ORAL | 1 refills | Status: DC

## 2023-04-07 MED ORDER — GLIPIZIDE ER 10 MG PO TB24
10.0000 mg | ORAL_TABLET | Freq: Every day | ORAL | 1 refills | Status: DC
Start: 2023-04-07 — End: 2023-10-01

## 2023-04-07 MED ORDER — GABAPENTIN 100 MG PO CAPS: 100.0000 mg | ORAL_CAPSULE | Freq: Every day | ORAL | 1 refills | Status: DC

## 2023-04-07 MED ORDER — ROSUVASTATIN CALCIUM 10 MG PO TABS: 10.0000 mg | ORAL_TABLET | ORAL | 1 refills | Status: DC

## 2023-04-07 NOTE — Progress Notes (Signed)
Subjective:    Patient ID: Catherine Cooper, female    DOB: 09/20/1941, 82 y.o.   MRN: [id number]   Chief Complaint: Medical Management of Chronic Issues    HPI:  Catherine Cooper is a 82 y.o. who identifies as a female who was assigned female at birth.   Social history: Lives with: husband Work history: retired   Water engineer in today for follow up of the following chronic medical issues:  1. Primary hypertension No c/o chest pain, sob or headache. Doe not check blood pressure at home. BP Readings from Last 3 Encounters:  01/01/23 137/76  10/02/22 (!) 148/70  06/24/22 130/75     2. Type 2 diabetes mellitus with diabetic polyneuropathy, without long-term current use of insulin (HCC) Fasting blood sugars are running around 110-140HGBA1c 7.2% Lab Results  Component Value Date   HGBA1C 7.1 (H) 01/01/2023     3. Hyperlipidemia associated with type 2 diabetes mellitus (HCC) Does try to watch diet and does no dedicated exercise. Lab Results  Component Value Date   CHOL 116 01/01/2023   HDL 59 01/01/2023   LDLCALC 44 01/01/2023   TRIG 60 01/01/2023   CHOLHDL 2.0 01/01/2023     4. Gastroesophageal reflux disease without esophagitis Is on protonix and is doing well.  5. Functional diarrhea Is tolerable  6. GAD (generalized anxiety disorder) Is on ativan and is doing well.  7. Age-related osteoporosis without current pathological fracture Last dexascan was done on 09/12/20. No longer repeating  8. Stage 3a chronic kidney disease (HCC) No voiding issues Lab Results  Component Value Date   CREATININE 0.88 01/01/2023     9. BMI 28.0-28.9,adult No weight changes Wt Readings from Last 3 Encounters:  04/07/23 145 lb (65.8 kg)  01/01/23 150 lb (68 kg)  10/02/22 149 lb (67.6 kg)   BMI Readings from Last 3 Encounters:  04/07/23 24.13 kg/m  01/01/23 24.96 kg/m  10/02/22 24.79 kg/m      New complaints: None today  Allergies  Allergen Reactions    Jardiance [Empagliflozin] Other (See Comments)    Legs eak   Lipitor [Atorvastatin]     fatique and myalgia   Penicillins Rash   Outpatient Encounter Medications as of 04/07/2023  Medication Sig   ALPHAGAN P 0.1 % SOLN Place 1 drop into both eyes 2 (two) times daily.    aspirin 81 MG tablet Take 81 mg by mouth every evening.    bifidobacterium infantis (ALIGN) capsule Take 1 capsule by mouth daily.   calcium citrate-vitamin D (CITRACAL+D) 315-200 MG-UNIT tablet Take 1 tablet by mouth 2 (two) times daily.   Cholecalciferol (VITAMIN D3) 2000 UNITS TABS Take 1 capsule by mouth daily.    Dulaglutide (TRULICITY) 3 MG/0.5ML SOPN Inject 3 mg as directed once a week.   FARXIGA 10 MG TABS tablet TAKE (1) TABLET DAILY IN THE MORNING.   gabapentin (NEURONTIN) 100 MG capsule Take 1 capsule (100 mg total) by mouth daily.   glipiZIDE (GLIPIZIDE XL) 10 MG 24 hr tablet Take 1 tablet (10 mg total) by mouth daily with breakfast.   ibuprofen (ADVIL,MOTRIN) 800 MG tablet Take 1 tablet (800 mg total) by mouth every 6 (six) hours as needed for pain.   latanoprost (XALATAN) 0.005 % ophthalmic solution Place 1 drop into both eyes at bedtime.    LORazepam (ATIVAN) 0.5 MG tablet TAKE 1 TABLET TWICE DAILY AS NEEDED FOR ANXIETY OR SLEEP   Multiple Vitamins-Minerals (PRESERVISION/LUTEIN PO) Take by mouth.   ONETOUCH  ULTRA test strip TEST BLOOD SUGAR ONCE DAILYAND AS NEEDED   pantoprazole (PROTONIX) 40 MG tablet Take 1 tablet (40 mg total) by mouth daily.   rosuvastatin (CRESTOR) 10 MG tablet Take 1 tablet (10 mg total) by mouth 3 (three) times a week.   SSD 1 % cream    vitamin B-12 (CYANOCOBALAMIN) 1000 MCG tablet Take 1,000 mcg by mouth daily.     No facility-administered encounter medications on file as of 04/07/2023.    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;  Surgeon: Nadara Mustard, MD;  Location: [facility] OR;  Service: Orthopedics;  Laterality: Right;   COLONOSCOPY  2013   EYE SURGERY     FOOT SURGERY     Right foot / left heel   glaucome surgery right eye     skin cancer removed right side of nose     SKIN GRAFT Right 2007   foot   TUBAL LIGATION      Family History  Problem Relation Age of Onset   Dementia Father    Diabetes Father    Coronary artery disease Father 62   Hip fracture Father    Alzheimer's disease Father    Atrial fibrillation Mother    Osteoporosis Mother       Controlled substance contract: n/a     Review of Systems  Constitutional:  Negative for diaphoresis.  Eyes:  Negative for pain.  Respiratory:  Negative for shortness of breath.   Cardiovascular:  Negative for chest pain, palpitations and leg swelling.  Gastrointestinal:  Negative for abdominal pain.  Endocrine: Negative for polydipsia.  Skin:  Negative for rash.  Neurological:  Negative for dizziness, weakness and headaches.  Hematological:  Does not bruise/bleed easily.  All other systems reviewed and are negative.      Objective:   Physical Exam Vitals and nursing note reviewed.  Constitutional:      General: She is not in acute distress.    Appearance: Normal appearance. She is well-developed.  HENT:     Head: Normocephalic.     Right Ear: Tympanic membrane normal.     Left Ear: Tympanic membrane normal.     Nose: Nose normal.     Mouth/Throat:     Mouth: Mucous membranes are moist.  Eyes:     Pupils: Pupils are equal, round, and reactive to light.  Neck:     Vascular: No carotid bruit or JVD.  Cardiovascular:     Rate and Rhythm: Normal rate and regular rhythm.     Heart sounds: Normal heart sounds.  Pulmonary:     Effort: Pulmonary effort is  normal. No respiratory distress.     Breath sounds: Normal breath sounds. No wheezing or rales.  Chest:     Chest wall: No tenderness.  Abdominal:     General: Bowel sounds are normal. There is no distension or abdominal bruit.     Palpations: Abdomen is soft. There is no hepatomegaly, splenomegaly, mass or pulsatile mass.     Tenderness: There is no abdominal tenderness.  Musculoskeletal:        General: Normal range of motion.  Cervical back: Normal range of motion and neck supple.  Lymphadenopathy:     Cervical: No cervical adenopathy.  Skin:    General: Skin is warm and dry.  Neurological:     Mental Status: She is alert and oriented to person, place, and time.     Deep Tendon Reflexes: Reflexes are normal and symmetric.  Psychiatric:        Behavior: Behavior normal.        Thought Content: Thought content normal.        Judgment: Judgment normal.    BP 127/73   Pulse 87   Temp 97.8 F (36.6 C) (Temporal)   Resp 20   Ht 5\' 5"  (1.651 m)   Wt 145 lb (65.8 kg)   SpO2 98%   BMI 24.13 kg/m    HGBA1c 7.2%      Assessment & Plan:   ZYARA RILING comes in today with chief complaint of Medical Management of Chronic Issues   Diagnosis and orders addressed:  1. Primary hypertension Low sodium diet - CBC with Differential/Platelet - CMP14+EGFR  2. Type 2 diabetes mellitus with diabetic polyneuropathy, without long-term current use of insulin (HCC) Continue to watch carbs in diet - Bayer DCA Hb A1c Waived - Dulaglutide (TRULICITY) 3 MG/0.5ML SOPN; Inject 3 mg as directed once a week.  Dispense: 6 mL; Refill: 5 - FARXIGA 10 MG TABS tablet; TAKE (1) TABLET DAILY IN THE MORNING.  Dispense: 90 tablet; Refill: 1 - gabapentin (NEURONTIN) 100 MG capsule; Take 1 capsule (100 mg total) by mouth daily.  Dispense: 90 capsule; Refill: 1 - glipiZIDE (GLIPIZIDE XL) 10 MG 24 hr tablet; Take 1 tablet (10 mg total) by mouth daily with breakfast.  Dispense: 90 tablet; Refill:  1  3. Hyperlipidemia associated with type 2 diabetes mellitus (HCC) Low fat diet - Lipid panel  rosuvastatin (CRESTOR) 10 MG tablet; Take 1 tablet (10 mg total) by mouth 3 (three) times a week.  Dispense: 36 tablet; Refill: 1  4. Gastroesophageal reflux disease without esophagitis Avoid spicy foods Do not eat 2 hours prior to bedtime - pantoprazole (PROTONIX) 40 MG tablet; Take 1 tablet (40 mg total) by mouth daily.  Dispense: 90 tablet; Refill: 1  5. Functional diarrhea Watch diet Imodium OTC  6. GAD (generalized anxiety disorder) Stress management - LORazepam (ATIVAN) 0.5 MG tablet; TAKE 1 TABLET TWICE DAILY AS NEEDED FOR ANXIETY OR SLEEP  Dispense: 60 tablet; Refill: 5  7. Age-related osteoporosis without current pathological fracture Weight bearing exercise  8. Stage 3a chronic kidney disease (HCC) No results found for this or any previous visit (from the past 2160 hour(s)). pending  9. BMI 28.0-28.9,adult Discussed diet and exercise for person with BMI >25 Will recheck weight in 3-6 months    Labs pending Health Maintenance reviewed Diet and exercise encouraged  Follow up plan: 6 months   Mary-Margaret Daphine Deutscher, FNP

## 2023-04-09 DIAGNOSIS — L97511 Non-pressure chronic ulcer of other part of right foot limited to breakdown of skin: Secondary | ICD-10-CM | POA: Diagnosis not present

## 2023-04-13 ENCOUNTER — Other Ambulatory Visit: Payer: Self-pay

## 2023-04-13 DIAGNOSIS — Z780 Asymptomatic menopausal state: Secondary | ICD-10-CM

## 2023-04-15 ENCOUNTER — Encounter: Payer: Self-pay | Admitting: Nurse Practitioner

## 2023-04-21 DIAGNOSIS — L97511 Non-pressure chronic ulcer of other part of right foot limited to breakdown of skin: Secondary | ICD-10-CM | POA: Diagnosis not present

## 2023-06-22 ENCOUNTER — Encounter (INDEPENDENT_AMBULATORY_CARE_PROVIDER_SITE_OTHER): Payer: Medicare Other | Admitting: Ophthalmology

## 2023-06-22 DIAGNOSIS — H353114 Nonexudative age-related macular degeneration, right eye, advanced atrophic with subfoveal involvement: Secondary | ICD-10-CM | POA: Diagnosis not present

## 2023-06-22 DIAGNOSIS — H353122 Nonexudative age-related macular degeneration, left eye, intermediate dry stage: Secondary | ICD-10-CM | POA: Diagnosis not present

## 2023-06-22 DIAGNOSIS — E113492 Type 2 diabetes mellitus with severe nonproliferative diabetic retinopathy without macular edema, left eye: Secondary | ICD-10-CM | POA: Diagnosis not present

## 2023-06-22 DIAGNOSIS — E113491 Type 2 diabetes mellitus with severe nonproliferative diabetic retinopathy without macular edema, right eye: Secondary | ICD-10-CM | POA: Diagnosis not present

## 2023-07-07 ENCOUNTER — Ambulatory Visit (INDEPENDENT_AMBULATORY_CARE_PROVIDER_SITE_OTHER): Payer: Medicare Other | Admitting: Nurse Practitioner

## 2023-07-07 ENCOUNTER — Ambulatory Visit: Payer: BC Managed Care – PPO

## 2023-07-07 ENCOUNTER — Encounter: Payer: Self-pay | Admitting: Nurse Practitioner

## 2023-07-07 VITALS — BP 91/51 | HR 77 | Temp 97.7°F | Resp 20 | Ht 65.0 in | Wt 146.0 lb

## 2023-07-07 DIAGNOSIS — Z23 Encounter for immunization: Secondary | ICD-10-CM | POA: Diagnosis not present

## 2023-07-07 DIAGNOSIS — Z7984 Long term (current) use of oral hypoglycemic drugs: Secondary | ICD-10-CM

## 2023-07-07 DIAGNOSIS — E1142 Type 2 diabetes mellitus with diabetic polyneuropathy: Secondary | ICD-10-CM | POA: Diagnosis not present

## 2023-07-07 DIAGNOSIS — E785 Hyperlipidemia, unspecified: Secondary | ICD-10-CM

## 2023-07-07 DIAGNOSIS — E1169 Type 2 diabetes mellitus with other specified complication: Secondary | ICD-10-CM | POA: Diagnosis not present

## 2023-07-07 DIAGNOSIS — Z6828 Body mass index (BMI) 28.0-28.9, adult: Secondary | ICD-10-CM

## 2023-07-07 DIAGNOSIS — I10 Essential (primary) hypertension: Secondary | ICD-10-CM | POA: Diagnosis not present

## 2023-07-07 DIAGNOSIS — N1831 Chronic kidney disease, stage 3a: Secondary | ICD-10-CM

## 2023-07-07 DIAGNOSIS — F411 Generalized anxiety disorder: Secondary | ICD-10-CM

## 2023-07-07 DIAGNOSIS — M810 Age-related osteoporosis without current pathological fracture: Secondary | ICD-10-CM

## 2023-07-07 LAB — LIPID PANEL

## 2023-07-07 LAB — BAYER DCA HB A1C WAIVED: HB A1C (BAYER DCA - WAIVED): 7.2 % — ABNORMAL HIGH (ref 4.8–5.6)

## 2023-07-07 NOTE — Progress Notes (Signed)
Subjective:    Patient ID: Catherine Cooper, female    DOB: Nov 05, 1940, 82 y.o.   MRN: [id number]   Chief Complaint: Medical Management of Chronic Issues    HPI:  Catherine Cooper is a 82 y.o. who identifies as a female who was assigned female at birth.   Social history: Lives with: husband Work history: retired   Water engineer in today for follow up of the following chronic medical issues:  1. Primary hypertension No c/o chest pain, sob or headache. Does not check blood pressure at home. BP Readings from Last 3 Encounters:  04/07/23 127/73  01/01/23 137/76  10/02/22 (!) 148/70     2. Type 2 diabetes mellitus with diabetic polyneuropathy, without long-term current use of insulin (HCC) Fasting blood sugars are running around 120-160.  N low blood sugars Lab Results  Component Value Date   HGBA1C 7.2 (H) 04/07/2023     3. Hyperlipidemia associated with type 2 diabetes mellitus (HCC) Does try to watch diet. Does no dedicated exercise. Lab Results  Component Value Date   CHOL 101 04/07/2023   HDL 53 04/07/2023   LDLCALC 34 04/07/2023   TRIG 62 04/07/2023   CHOLHDL 1.9 04/07/2023     4. Stage 3a chronic kidney disease (HCC) No voiding issues- no edema Lab Results  Component Value Date   CREATININE 0.89 04/07/2023     5. GAD (generalized anxiety disorder) Takes ativan BID.    07/07/2023    9:15 AM 04/07/2023    9:25 AM 01/01/2023   10:37 AM 10/02/2022    9:56 AM  GAD 7 : Generalized Anxiety Score  Nervous, Anxious, on Edge 0 0 0 0  Control/stop worrying 0 0 0 0  Worry too much - different things 0 0 0 0  Trouble relaxing 0 0 0 0  Restless 0 0 0 0  Easily annoyed or irritable 0 0 0 0  Afraid - awful might happen 0 0 0 0  Total GAD 7 Score 0 0 0 0  Anxiety Difficulty Not difficult at all Not difficult at all Not difficult at all Not difficult at all      6. Age-related osteoporosis without current pathological fracture Last dexascan was several years ago.  She no longer wants to do bine density test at her age.  7. BMI 28.0-28.9,adult No recent weight changes Wt Readings from Last 3 Encounters:  07/07/23 146 lb (66.2 kg)  04/07/23 145 lb (65.8 kg)  01/01/23 150 lb (68 kg)   BMI Readings from Last 3 Encounters:  07/07/23 24.30 kg/m  04/07/23 24.13 kg/m  01/01/23 24.96 kg/m      New complaints: None today  Allergies  Allergen Reactions   Jardiance [Empagliflozin] Other (See Comments)    Legs eak   Lipitor [Atorvastatin]     fatique and myalgia   Penicillins Rash   Outpatient Encounter Medications as of 07/07/2023  Medication Sig   ALPHAGAN P 0.1 % SOLN Place 1 drop into both eyes 2 (two) times daily.    aspirin 81 MG tablet Take 81 mg by mouth every evening.    bifidobacterium infantis (ALIGN) capsule Take 1 capsule by mouth daily.   calcium citrate-vitamin D (CITRACAL+D) 315-200 MG-UNIT tablet Take 1 tablet by mouth 2 (two) times daily.   Cholecalciferol (VITAMIN D3) 2000 UNITS TABS Take 1 capsule by mouth daily.    Dulaglutide (TRULICITY) 3 MG/0.5ML SOPN Inject 3 mg as directed once a week.   FARXIGA 10 MG TABS  tablet TAKE (1) TABLET DAILY IN THE MORNING.   gabapentin (NEURONTIN) 100 MG capsule Take 1 capsule (100 mg total) by mouth daily.   glipiZIDE (GLIPIZIDE XL) 10 MG 24 hr tablet Take 1 tablet (10 mg total) by mouth daily with breakfast.   ibuprofen (ADVIL,MOTRIN) 800 MG tablet Take 1 tablet (800 mg total) by mouth every 6 (six) hours as needed for pain.   latanoprost (XALATAN) 0.005 % ophthalmic solution Place 1 drop into both eyes at bedtime.    LORazepam (ATIVAN) 0.5 MG tablet TAKE 1 TABLET TWICE DAILY AS NEEDED FOR ANXIETY OR SLEEP   Multiple Vitamins-Minerals (PRESERVISION/LUTEIN PO) Take by mouth.   ONETOUCH ULTRA test strip TEST BLOOD SUGAR ONCE DAILYAND AS NEEDED   pantoprazole (PROTONIX) 40 MG tablet Take 1 tablet (40 mg total) by mouth daily.   rosuvastatin (CRESTOR) 10 MG tablet Take 1 tablet (10 mg total)  by mouth 3 (three) times a week.   SSD 1 % cream    vitamin B-12 (CYANOCOBALAMIN) 1000 MCG tablet Take 1,000 mcg by mouth daily.     No facility-administered encounter medications on file as of 07/07/2023.    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;  Surgeon: Nadara Mustard, MD;  Location: [facility] OR;  Service: Orthopedics;  Laterality: Right;   COLONOSCOPY  2013   EYE SURGERY     FOOT SURGERY     Right foot / left heel   glaucome surgery right eye     skin cancer removed right side of nose     SKIN GRAFT Right 2007   foot   TUBAL LIGATION      Family History  Problem Relation Age of Onset   Dementia Father    Diabetes Father    Coronary artery disease Father 82   Hip fracture Father    Alzheimer's disease Father    Atrial fibrillation Mother    Osteoporosis Mother       Controlled substance contract: n/a     Review of Systems  Constitutional:  Negative for diaphoresis.  Eyes:  Negative for pain.  Respiratory:  Negative for shortness of breath.   Cardiovascular:  Negative for chest pain, palpitations and leg swelling.  Gastrointestinal:  Negative for abdominal pain.  Endocrine: Negative for polydipsia.  Skin:  Negative for rash.  Neurological:  Negative for dizziness, weakness and headaches.  Hematological:  Does not bruise/bleed easily.  All other systems reviewed and are negative.      Objective:   Physical Exam Vitals and nursing note reviewed.  Constitutional:      General: She is not in acute distress.    Appearance: Normal appearance. She is well-developed.  HENT:     Head: Normocephalic.     Right Ear: Tympanic membrane normal.      Left Ear: Tympanic membrane normal.     Nose: Nose normal.     Mouth/Throat:     Mouth: Mucous membranes are moist.  Eyes:     Pupils: Pupils are equal, round, and reactive to light.  Neck:     Vascular: No carotid bruit or JVD.  Cardiovascular:     Rate  and Rhythm: Normal rate and regular rhythm.     Heart sounds: Normal heart sounds.  Pulmonary:     Effort: Pulmonary effort is normal. No respiratory distress.     Breath sounds: Normal breath sounds. No wheezing or rales.  Chest:     Chest wall: No tenderness.  Abdominal:     General: Bowel sounds are normal. There is no distension or abdominal bruit.     Palpations: Abdomen is soft. There is no hepatomegaly, splenomegaly, mass or pulsatile mass.     Tenderness: There is no abdominal tenderness.  Musculoskeletal:        General: Normal range of motion.     Cervical back: Normal range of motion and neck supple.  Lymphadenopathy:     Cervical: No cervical adenopathy.  Skin:    General: Skin is warm and dry.  Neurological:     Mental Status: She is alert and oriented to person, place, and time.     Deep Tendon Reflexes: Reflexes are normal and symmetric.  Psychiatric:        Behavior: Behavior normal.        Thought Content: Thought content normal.        Judgment: Judgment normal.     BP (!) 91/51   Pulse 77   Temp 97.7 F (36.5 C) (Temporal)   Resp 20   Ht 5\' 5"  (1.651 m)   Wt 146 lb (66.2 kg)   SpO2 100%   BMI 24.30 kg/m   Hgba1c 7.2%     Assessment & Plan:  LAURIANNE QUERTERMOUS comes in today with chief complaint of Medical Management of Chronic Issues   Diagnosis and orders addressed:  1. Primary hypertension Low sodium diet - CBC with Differential/Platelet - CMP14+EGFR  2. Type 2 diabetes mellitus with diabetic polyneuropathy, without long-term current use of insulin (HCC) Continue to watch carbs in diet - Bayer DCA Hb A1c Waived - Microalbumin / creatinine urine ratio  3. Hyperlipidemia  associated with type 2 diabetes mellitus (HCC) Low fat diet - Lipid panel  4. Stage 3a chronic kidney disease (HCC) Labs pending  5. GAD (generalized anxiety disorder) Stress management  6. Age-related osteoporosis without current pathological fracture Weight bearing exercise  7. BMI 28.0-28.9,adult Discussed diet and exercise for person with BMI >25 Will recheck weight in 3-6 months    Labs pending Health Maintenance reviewed Diet and exercise encouraged  Follow up plan: 3 months   Mary-Margaret Daphine Deutscher, FNP

## 2023-07-07 NOTE — Patient Instructions (Signed)

## 2023-07-08 LAB — CMP14+EGFR
ALT: 15 IU/L (ref 0–32)
AST: 14 IU/L (ref 0–40)
Albumin: 4 g/dL (ref 3.7–4.7)
Alkaline Phosphatase: 82 IU/L (ref 44–121)
BUN/Creatinine Ratio: 13 (ref 12–28)
BUN: 12 mg/dL (ref 8–27)
Bilirubin Total: 0.7 mg/dL (ref 0.0–1.2)
CO2: 24 mmol/L (ref 20–29)
Calcium: 8.8 mg/dL (ref 8.7–10.3)
Chloride: 107 mmol/L — ABNORMAL HIGH (ref 96–106)
Creatinine, Ser: 0.91 mg/dL (ref 0.57–1.00)
Globulin, Total: 2.5 g/dL (ref 1.5–4.5)
Glucose: 141 mg/dL — ABNORMAL HIGH (ref 70–99)
Potassium: 4 mmol/L (ref 3.5–5.2)
Sodium: 145 mmol/L — ABNORMAL HIGH (ref 134–144)
Total Protein: 6.5 g/dL (ref 6.0–8.5)
eGFR: 63 mL/min/{1.73_m2} (ref >59)

## 2023-07-08 LAB — CBC WITH DIFFERENTIAL/PLATELET
Basophils Absolute: 0.1 10*3/uL (ref 0.0–0.2)
Basos: 1 %
EOS (ABSOLUTE): 0.4 10*3/uL (ref 0.0–0.4)
Eos: 6 %
Hematocrit: 40.1 % (ref 34.0–46.6)
Hemoglobin: 12.9 g/dL (ref 11.1–15.9)
Immature Grans (Abs): 0.1 10*3/uL (ref 0.0–0.1)
Immature Granulocytes: 1 %
Lymphocytes Absolute: 2 10*3/uL (ref 0.7–3.1)
Lymphs: 32 %
MCH: 31.7 pg (ref 26.6–33.0)
MCHC: 32.2 g/dL (ref 31.5–35.7)
MCV: 99 fL — ABNORMAL HIGH (ref 79–97)
Monocytes Absolute: 0.5 10*3/uL (ref 0.1–0.9)
Monocytes: 8 %
Neutrophils Absolute: 3.2 10*3/uL (ref 1.4–7.0)
Neutrophils: 52 %
Platelets: 150 10*3/uL (ref 150–450)
RBC: 4.07 x10E6/uL (ref 3.77–5.28)
RDW: 11.8 % (ref 11.7–15.4)
WBC: 6.1 10*3/uL (ref 3.4–10.8)

## 2023-07-08 LAB — LIPID PANEL
Cholesterol, Total: 124 mg/dL (ref 100–199)
HDL: 55 mg/dL (ref >39)
LDL CALC COMMENT:: 2.3 ratio (ref 0.0–4.4)
LDL Chol Calc (NIH): 47 mg/dL (ref 0–99)
Triglycerides: 122 mg/dL (ref 0–149)
VLDL Cholesterol Cal: 22 mg/dL (ref 5–40)

## 2023-07-08 LAB — MICROALBUMIN / CREATININE URINE RATIO
Creatinine, Urine: 79.7 mg/dL
Microalb/Creat Ratio: 13 mg/g creat (ref 0–29)
Microalbumin, Urine: 10.1 ug/mL

## 2023-07-09 ENCOUNTER — Ambulatory Visit: Payer: BC Managed Care – PPO | Admitting: Nurse Practitioner

## 2023-07-14 DIAGNOSIS — H353114 Nonexudative age-related macular degeneration, right eye, advanced atrophic with subfoveal involvement: Secondary | ICD-10-CM | POA: Diagnosis not present

## 2023-07-14 DIAGNOSIS — E113493 Type 2 diabetes mellitus with severe nonproliferative diabetic retinopathy without macular edema, bilateral: Secondary | ICD-10-CM | POA: Diagnosis not present

## 2023-07-14 DIAGNOSIS — H353122 Nonexudative age-related macular degeneration, left eye, intermediate dry stage: Secondary | ICD-10-CM | POA: Diagnosis not present

## 2023-07-14 LAB — HM DIABETES EYE EXAM

## 2023-07-16 DIAGNOSIS — B351 Tinea unguium: Secondary | ICD-10-CM | POA: Diagnosis not present

## 2023-07-16 DIAGNOSIS — M79676 Pain in unspecified toe(s): Secondary | ICD-10-CM | POA: Diagnosis not present

## 2023-07-16 DIAGNOSIS — E1142 Type 2 diabetes mellitus with diabetic polyneuropathy: Secondary | ICD-10-CM | POA: Diagnosis not present

## 2023-07-16 DIAGNOSIS — L84 Corns and callosities: Secondary | ICD-10-CM | POA: Diagnosis not present

## 2023-08-06 ENCOUNTER — Other Ambulatory Visit (HOSPITAL_COMMUNITY): Payer: Self-pay

## 2023-08-14 ENCOUNTER — Telehealth: Payer: Self-pay | Admitting: Pharmacist

## 2023-08-14 NOTE — Telephone Encounter (Signed)
   Patient stable on Trulicity 3mg  and Farxiga 10mg  for T2DM Cost is of concern Samples given of each Application for Farxiga (AZ&me) given for 2025 for patient to complete at return PAP for trulicity is closed at this time Will follow    Kieth Brightly, PharmD, BCACP, CPP Clinical Pharmacist, [facility]

## 2023-08-25 DIAGNOSIS — E113491 Type 2 diabetes mellitus with severe nonproliferative diabetic retinopathy without macular edema, right eye: Secondary | ICD-10-CM | POA: Diagnosis not present

## 2023-08-25 DIAGNOSIS — E113492 Type 2 diabetes mellitus with severe nonproliferative diabetic retinopathy without macular edema, left eye: Secondary | ICD-10-CM | POA: Diagnosis not present

## 2023-08-25 DIAGNOSIS — H353122 Nonexudative age-related macular degeneration, left eye, intermediate dry stage: Secondary | ICD-10-CM | POA: Diagnosis not present

## 2023-08-25 DIAGNOSIS — H353114 Nonexudative age-related macular degeneration, right eye, advanced atrophic with subfoveal involvement: Secondary | ICD-10-CM | POA: Diagnosis not present

## 2023-08-25 DIAGNOSIS — H401130 Primary open-angle glaucoma, bilateral, stage unspecified: Secondary | ICD-10-CM | POA: Diagnosis not present

## 2023-08-26 ENCOUNTER — Telehealth: Payer: Self-pay

## 2023-08-26 ENCOUNTER — Other Ambulatory Visit (HOSPITAL_COMMUNITY): Payer: Self-pay

## 2023-08-26 ENCOUNTER — Telehealth: Payer: Self-pay | Admitting: *Deleted

## 2023-08-26 NOTE — Telephone Encounter (Signed)
Patient is needing a prior authorization on Trulicity  Copied from CRM [id number]. Topic: Clinical - Medication Question >> Aug 26, 2023 11:10 AM Adelina Mings wrote: Reason for CRM: prior authorization

## 2023-08-26 NOTE — Telephone Encounter (Signed)
PA request has been Submitted. New Encounter created for follow up. For additional info see Pharmacy Prior Auth telephone encounter from 08/26/23.

## 2023-08-26 NOTE — Telephone Encounter (Signed)
Pharmacy Patient Advocate Encounter   Received notification from Pt Calls Messages that prior authorization for Trulicity 3MG /0.5ML auto-injectors is required/requested.   Insurance verification completed.   The patient is insured through [facility] .   Per test claim: PA required; PA submitted to above mentioned insurance via CoverMyMeds Key/confirmation #/EOC [facility] Status is pending

## 2023-08-27 ENCOUNTER — Other Ambulatory Visit (HOSPITAL_COMMUNITY): Payer: Self-pay

## 2023-08-27 NOTE — Telephone Encounter (Signed)
Pharmacy Patient Advocate Encounter  Received notification from [facility] that Prior Authorization for Trulicity 3MG /0.5ML has been APPROVED from 08/26/23 to 08/25/26. Ran test claim, Copay is $47. This test claim was processed through [facility]- copay amounts may vary at other pharmacies due to pharmacy/plan contracts, or as the patient moves through the different stages of their insurance plan.   PA #/Case ID/Reference #: [id number]

## 2023-09-01 ENCOUNTER — Ambulatory Visit (INDEPENDENT_AMBULATORY_CARE_PROVIDER_SITE_OTHER): Payer: Medicare Other

## 2023-09-01 VITALS — Ht 65.0 in | Wt 142.0 lb

## 2023-09-01 DIAGNOSIS — Z0000 Encounter for general adult medical examination without abnormal findings: Secondary | ICD-10-CM | POA: Diagnosis not present

## 2023-09-01 NOTE — Patient Instructions (Signed)
Catherine Cooper , Thank you for taking time to come for your Medicare Wellness Visit. I appreciate your ongoing commitment to your health goals. Please review the following plan we discussed and let me know if I can assist you in the future.   Referrals/Orders/Follow-Ups/Clinician Recommendations: Aim for 30 minutes of exercise or brisk walking, 6-8 glasses of water, and 5 servings of fruits and vegetables each day.   This is a list of the screening recommended for you and due dates:  Health Maintenance  Topic Date Due   COVID-19 Vaccine (4 - 2023-24 season) 06/14/2023   DEXA scan (bone density measurement)  04/06/2024*   Mammogram  11/11/2023   Hemoglobin A1C  01/04/2024   Yearly kidney function blood test for diabetes  07/06/2024   Yearly kidney health urinalysis for diabetes  07/06/2024   Complete foot exam   07/06/2024   Eye exam for diabetics  07/13/2024   Medicare Annual Wellness Visit  08/31/2024   DTaP/Tdap/Td vaccine (2 - Td or Tdap) 06/20/2031   Pneumonia Vaccine  Completed   Flu Shot  Completed   Zoster (Shingles) Vaccine  Completed   HPV Vaccine  Aged Out  *Topic was postponed. The date shown is not the original due date.    Advanced directives: (In Chart) A copy of your advanced directives are scanned into your chart should your provider ever need it.  Next Medicare Annual Wellness Visit scheduled for next year: Yes  Insert Preventive Care attachment Insert FALL PREVENTION attachment if needed

## 2023-09-01 NOTE — Progress Notes (Signed)
Subjective:   Catherine Cooper is a 82 y.o. female who presents for Medicare Annual (Subsequent) preventive examination.  Visit Complete: Virtual I connected with  [name] on 09/01/23 by a audio enabled telemedicine application and verified that I am speaking with the correct person using two identifiers.  Patient Location: Home  Provider Location: Home Office  I discussed the limitations of evaluation and management by telemedicine. The patient expressed understanding and agreed to proceed.  Vital Signs: Because this visit was a virtual/telehealth visit, some criteria may be missing or patient reported. Any vitals not documented were not able to be obtained and vitals that have been documented are patient reported.  Patient Medicare AWV questionnaire was completed by the patient on 09/01/2023; I have confirmed that all information answered by patient is correct and no changes since this date.  Cardiac Risk Factors include: advanced age (>41men, >81 women);diabetes mellitus;dyslipidemia;hypertension     Objective:    Today's Vitals   09/01/23 1459  Weight: 142 lb (64.4 kg)  Height: 5\' 5"  (1.651 m)   Body mass index is 23.63 kg/m.     09/01/2023    3:06 PM 05/30/2022    8:27 AM 05/29/2021    8:18 AM 05/28/2020    8:26 AM 05/19/2019    8:44 AM 03/28/2016    7:36 AM 07/18/2015    8:11 AM  Advanced Directives  Does Patient Have a Medical Advance Directive? Yes Yes Yes No No No No  Type of Estate agent of [place];Living will Healthcare Power of [place];Living will Healthcare Power of [place];Living will      Does patient want to make changes to medical advance directive? No - Patient declined No - Patient declined       Copy of Healthcare Power of Attorney in Chart? Yes - validated most recent copy scanned in chart (See row information) Yes - validated most recent copy scanned in chart (See row information) No - copy requested      Would patient like  information on creating a medical advance directive?    No - Patient declined No - Patient declined No - patient declined information No - patient declined information    Current Medications (verified) Outpatient Encounter Medications as of 09/01/2023  Medication Sig   ALPHAGAN P 0.1 % SOLN Place 1 drop into both eyes 2 (two) times daily.    aspirin 81 MG tablet Take 81 mg by mouth every evening.    bifidobacterium infantis (ALIGN) capsule Take 1 capsule by mouth daily.   calcium citrate-vitamin D (CITRACAL+D) 315-200 MG-UNIT tablet Take 1 tablet by mouth 2 (two) times daily.   Cholecalciferol (VITAMIN D3) 2000 UNITS TABS Take 1 capsule by mouth daily.    Dulaglutide (TRULICITY) 3 MG/0.5ML SOPN Inject 3 mg as directed once a week.   FARXIGA 10 MG TABS tablet TAKE (1) TABLET DAILY IN THE MORNING.   gabapentin (NEURONTIN) 100 MG capsule Take 1 capsule (100 mg total) by mouth daily.   glipiZIDE (GLIPIZIDE XL) 10 MG 24 hr tablet Take 1 tablet (10 mg total) by mouth daily with breakfast.   ibuprofen (ADVIL,MOTRIN) 800 MG tablet Take 1 tablet (800 mg total) by mouth every 6 (six) hours as needed for pain.   latanoprost (XALATAN) 0.005 % ophthalmic solution Place 1 drop into both eyes at bedtime.    LORazepam (ATIVAN) 0.5 MG tablet TAKE 1 TABLET TWICE DAILY AS NEEDED FOR ANXIETY OR SLEEP   Multiple Vitamins-Minerals (PRESERVISION/LUTEIN PO) Take  by mouth.   ONETOUCH ULTRA test strip TEST BLOOD SUGAR ONCE DAILYAND AS NEEDED   pantoprazole (PROTONIX) 40 MG tablet Take 1 tablet (40 mg total) by mouth daily.   rosuvastatin (CRESTOR) 10 MG tablet Take 1 tablet (10 mg total) by mouth 3 (three) times a week.   SSD 1 % cream    vitamin B-12 (CYANOCOBALAMIN) 1000 MCG tablet Take 1,000 mcg by mouth daily.     No facility-administered encounter medications on file as of 09/01/2023.    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)    left foot   Diabetes mellitus    type II   Diabetic neuropathy (HCC)    Diarrhea    attributed to diabetic meds   Endometrioid adenocarcinoma    GAD (generalized anxiety disorder)    GERD (gastroesophageal reflux disease)    Glaucoma    Hyperlipidemia    not on medications   Iron deficiency    Nephrolithiasis 2000   Neuropathy    both feet   Osteoporosis    Shingles 2004   Squamous cell carcinoma of skin 03/20/2020   dorsum of nose   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   Right Upper Nose tx cx3 [id number]   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;  Surgeon: Nadara Mustard, MD;  Location: [facility];  Service: Orthopedics;  Laterality: Right;   COLONOSCOPY  2013   EYE SURGERY     FOOT SURGERY     Right foot / left heel   glaucome surgery right eye     skin cancer removed right side of nose     SKIN GRAFT Right 2007   foot   TUBAL LIGATION     Family History  Problem Relation Age of Onset   Dementia Father    Diabetes Father    Coronary artery disease Father 54   Hip fracture Father    Alzheimer's disease Father    Atrial fibrillation Mother    Osteoporosis Mother    Social History   Socioeconomic History   Marital status: Married    Spouse name: Leonette Most   Number of children: 3   Years of education: 12   Highest education level: Some college, no degree  Occupational History   Occupation: retired    Comment: Scientific laboratory technician of [employer]   Tobacco Use   Smoking status: Never   Smokeless tobacco: Never  Vaping Use   Vaping status: Never Used  Substance and Sexual Activity   Alcohol use: No   Drug use: No   Sexual activity: Not on file  Other Topics Concern   Not on file  Social History Narrative   2 healthy daughters and 1 son deceased (suicide @ 89)   Daughter lives nearby plus 2 grandchildren - one daughter in [place]   Social Determinants of Health   Financial Resource Strain: Low Risk  (09/01/2023)  Overall Financial Resource Strain (CARDIA)    Difficulty of Paying Living Expenses: Not hard at all  Food Insecurity: No Food Insecurity (09/01/2023)   Hunger Vital Sign    Worried About Running Out of Food in the Last Year: Never true    Ran Out of Food in the Last Year: Never true  Transportation Needs: No Transportation Needs (09/01/2023)   PRAPARE - Administrator, Civil Service (Medical): No    Lack of Transportation (Non-Medical): No  Physical Activity: Inactive (09/01/2023)   Exercise Vital Sign    Days of Exercise per Week: 0 days    Minutes of Exercise per Session: 0 min  Stress: No Stress Concern Present (09/01/2023)   [employer] of Occupational Health - Occupational Stress Questionnaire    Feeling of Stress : Not at all  Social Connections: Socially Integrated (09/01/2023)   Social Connection and Isolation Panel [NHANES]    Frequency of Communication with Friends and Family: More than three times a week    Frequency of Social Gatherings with Friends and Family: More than three times a week    Attends Religious Services: More than 4 times per year    Active Member of [employer] or Organizations: Yes    Attends Engineer, structural: More than 4 times per year    Marital Status: Married    Tobacco Counseling Counseling given: Not Answered   Clinical Intake:  Pre-visit preparation completed: Yes  Pain : No/denies pain     Nutritional Risks: None Diabetes: Yes CBG  done?: No Did pt. bring in CBG monitor from home?: No  How often do you need to have someone help you when you read instructions, pamphlets, or other written materials from your doctor or pharmacy?: 1 - Never  Interpreter Needed?: No  Information entered by :: Renie Ora, LPN   Activities of Daily Living    09/01/2023    3:06 PM  In your present state of health, do you have any difficulty performing the following activities:  Hearing? 0  Vision? 0  Difficulty concentrating or making decisions? 0  Walking or climbing stairs? 0  Dressing or bathing? 0  Doing errands, shopping? 0  Preparing Food and eating ? N  Using the Toilet? N  In the past six months, have you accidently leaked urine? N  Do you have problems with loss of bowel control? N  Managing your Medications? N  Managing your Finances? N  Housekeeping or managing your Housekeeping? N    Patient Care Team: Bennie Pierini, FNP as PCP - General (Nurse Practitioner) Adam Phenix, DPM as Consulting Physician (Podiatry) Luciana Axe Alford Highland, MD as Consulting Physician (Ophthalmology) Mateo Flow, MD as Consulting Physician (Ophthalmology) Janalyn Harder, MD (Inactive) as Consulting Physician (Dermatology) Danella Maiers, [facility] (Pharmacist)  Indicate any recent Medical Services you may have received from other than [facility] providers in the past year (date may be approximate).     Assessment:   This is a routine wellness examination for Kaelly.  Hearing/Vision screen Vision Screening - Comments:: Wears rx glasses - up to date with routine eye exams with  Dr.Rankin /Dr.Hecker    Goals Addressed             This Visit's Progress    DIET - EAT MORE FRUITS AND VEGETABLES         Depression Screen    09/01/2023    3:05 PM 07/07/2023    9:15 AM 04/07/2023    9:35 AM  04/07/2023    9:24 AM 01/01/2023   10:36 AM 10/02/2022    9:56 AM 06/24/2022    8:42 AM  PHQ 2/9 Scores  PHQ - 2 Score 0 0 0 0 0 0 0  PHQ- 9  Score 0 0  1 0 0 0    Fall Risk    09/01/2023    3:01 PM 07/07/2023    9:15 AM 04/07/2023    9:35 AM 04/07/2023    9:24 AM 01/01/2023   10:36 AM  Fall Risk   Falls in the past year? 0 0 0 0 0  Number falls in past yr: 0      Injury with Fall? 0      Risk for fall due to : No Fall Risks      Follow up Falls prevention discussed        MEDICARE RISK AT HOME: Medicare Risk at Home Any stairs in or around the home?: Yes If so, are there any without handrails?: No Home free of loose throw rugs in walkways, pet beds, electrical cords, etc?: Yes Adequate lighting in your home to reduce risk of falls?: Yes Life alert?: No Use of a cane, walker or w/c?: No Grab bars in the bathroom?: No Shower chair or bench in shower?: No Elevated toilet seat or a handicapped toilet?: No  TIMED UP AND GO:  Was the test performed?  No    Cognitive Function:    02/16/2018    8:37 AM 07/18/2015    8:19 AM  MMSE - Mini Mental State Exam  Orientation to time 5 5  Orientation to Place 5 5  Registration 3 3  Attention/ Calculation 5 5  Recall 3 3  Language- name 2 objects 2 2  Language- repeat 1 1  Language- follow 3 step command 3 3  Language- read & follow direction 1 1  Write a sentence 1 1  Copy design 1 1  Total score 30 30        09/01/2023    3:07 PM 05/30/2022    8:27 AM 05/28/2020    8:27 AM 05/19/2019    8:46 AM  6CIT Screen  What Year? 0 points 0 points 0 points 0 points  What month? 0 points 0 points 0 points 0 points  What time? 0 points 0 points 0 points 0 points  Count back from 20 0 points 0 points 0 points 0 points  Months in reverse 0 points 0 points 0 points 0 points  Repeat phrase 0 points 0 points 0 points 2 points  Total Score 0 points 0 points 0 points 2 points    Immunizations Immunization History  Administered Date(s) Administered   Fluad Quad(high Dose 65+) 08/11/2019, 07/02/2020, 07/27/2021   Fluad Trivalent(High Dose 65+) 07/07/2023   Influenza Whole  08/12/2010   Influenza, High Dose Seasonal PF 08/21/2016, 07/14/2017, 07/12/2018   Influenza,inj,Quad PF,6+ Mos 07/04/2013, 07/20/2014, 07/18/2015, 07/14/2022   Moderna SARS-COV2 Booster Vaccination 05/14/2021   Moderna Sars-Covid-2 Vaccination 12/12/2019, 01/09/2020, 08/21/2020   Pneumococcal Conjugate-13 01/31/2015   Pneumococcal Polysaccharide-23 08/12/2010   Tdap 06/19/2021   Zoster Recombinant(Shingrix) 03/26/2021, 09/19/2021   Zoster, Live 04/12/2014    TDAP status: Up to date  Flu Vaccine status: Up to date  Pneumococcal vaccine status: Up to date  Covid-19 vaccine status: Completed vaccines  Qualifies for Shingles Vaccine? Yes   Zostavax completed Yes   Shingrix Completed?: Yes  Screening Tests Health Maintenance  Topic Date Due   COVID-19  Vaccine (4 - 2023-24 season) 06/14/2023   DEXA SCAN  04/06/2024 (Originally 09/12/2022)   MAMMOGRAM  11/11/2023   HEMOGLOBIN A1C  01/04/2024   Diabetic kidney evaluation - eGFR measurement  07/06/2024   Diabetic kidney evaluation - Urine ACR  07/06/2024   FOOT EXAM  07/06/2024   OPHTHALMOLOGY EXAM  07/13/2024   Medicare Annual Wellness (AWV)  08/31/2024   DTaP/Tdap/Td (2 - Td or Tdap) 06/20/2031   Pneumonia Vaccine 59+ Years old  Completed   INFLUENZA VACCINE  Completed   Zoster Vaccines- Shingrix  Completed   HPV VACCINES  Aged Out    Health Maintenance  Health Maintenance Due  Topic Date Due   COVID-19 Vaccine (4 - 2023-24 season) 06/14/2023    Colorectal cancer screening: No longer required.   Mammogram status: No longer required due to age.  Bone Density status: Ordered scheduled 10/01/2023  . Pt provided with contact info and advised to call to schedule appt.  Lung Cancer Screening: (Low Dose CT Chest recommended if Age 57-80 years, 20 pack-year currently smoking OR have quit w/in 15years.) does not qualify.   Lung Cancer Screening Referral: n/a  Additional Screening:  Hepatitis C Screening: does not  qualify;  Vision Screening: Recommended annual ophthalmology exams for early detection of glaucoma and other disorders of the eye. Is the patient up to date with their annual eye exam?  Yes  Who is the provider or what is the name of the office in which the patient attends annual eye exams?  Dr.Rankin Elmer Picker  If pt is not established with a provider, would they like to be referred to a provider to establish care? No .   Dental Screening: Recommended annual dental exams for proper oral hygiene  Diabetic Foot Exam: Diabetic Foot Exam: Overdue, Pt has been advised about the importance in completing this exam. Pt is scheduled for diabetic foot exam on next office visit .  Community Resource Referral / Chronic Care Management: CRR required this visit?  No   CCM required this visit?  No     Plan:     I have personally reviewed and noted the following in the patient's chart:   Medical and social history Use of alcohol, tobacco or illicit drugs  Current medications and supplements including opioid prescriptions. Patient is not currently taking opioid prescriptions. Functional ability and status Nutritional status Physical activity Advanced directives List of other physicians Hospitalizations, surgeries, and ER visits in previous 12 months Vitals Screenings to include cognitive, depression, and falls Referrals and appointments  In addition, I have reviewed and discussed with patient certain preventive protocols, quality metrics, and best practice recommendations. A written personalized care plan for preventive services as well as general preventive health recommendations were provided to patient.     Lorrene Reid, LPN   16/07/[id number]   After Visit Summary: (MyChart) Due to this being a telephonic visit, the after visit summary with patients personalized plan was offered to patient via MyChart   Nurse Notes: none

## 2023-09-17 DIAGNOSIS — M79676 Pain in unspecified toe(s): Secondary | ICD-10-CM | POA: Diagnosis not present

## 2023-09-17 DIAGNOSIS — L84 Corns and callosities: Secondary | ICD-10-CM | POA: Diagnosis not present

## 2023-09-17 DIAGNOSIS — E1142 Type 2 diabetes mellitus with diabetic polyneuropathy: Secondary | ICD-10-CM | POA: Diagnosis not present

## 2023-09-17 DIAGNOSIS — B351 Tinea unguium: Secondary | ICD-10-CM | POA: Diagnosis not present

## 2023-09-30 DIAGNOSIS — H401221 Low-tension glaucoma, left eye, mild stage: Secondary | ICD-10-CM | POA: Diagnosis not present

## 2023-09-30 DIAGNOSIS — E113392 Type 2 diabetes mellitus with moderate nonproliferative diabetic retinopathy without macular edema, left eye: Secondary | ICD-10-CM | POA: Diagnosis not present

## 2023-09-30 DIAGNOSIS — H353132 Nonexudative age-related macular degeneration, bilateral, intermediate dry stage: Secondary | ICD-10-CM | POA: Diagnosis not present

## 2023-09-30 DIAGNOSIS — H524 Presbyopia: Secondary | ICD-10-CM | POA: Diagnosis not present

## 2023-09-30 DIAGNOSIS — H401212 Low-tension glaucoma, right eye, moderate stage: Secondary | ICD-10-CM | POA: Diagnosis not present

## 2023-10-01 ENCOUNTER — Other Ambulatory Visit: Payer: BC Managed Care – PPO

## 2023-10-01 ENCOUNTER — Encounter: Payer: Self-pay | Admitting: Nurse Practitioner

## 2023-10-01 ENCOUNTER — Ambulatory Visit (INDEPENDENT_AMBULATORY_CARE_PROVIDER_SITE_OTHER): Payer: Medicare Other | Admitting: Nurse Practitioner

## 2023-10-01 VITALS — BP 106/62 | HR 87 | Temp 97.9°F | Ht 65.0 in | Wt 142.6 lb

## 2023-10-01 DIAGNOSIS — E785 Hyperlipidemia, unspecified: Secondary | ICD-10-CM | POA: Diagnosis not present

## 2023-10-01 DIAGNOSIS — Z6828 Body mass index (BMI) 28.0-28.9, adult: Secondary | ICD-10-CM

## 2023-10-01 DIAGNOSIS — F411 Generalized anxiety disorder: Secondary | ICD-10-CM | POA: Diagnosis not present

## 2023-10-01 DIAGNOSIS — E1169 Type 2 diabetes mellitus with other specified complication: Secondary | ICD-10-CM

## 2023-10-01 DIAGNOSIS — N1831 Chronic kidney disease, stage 3a: Secondary | ICD-10-CM | POA: Diagnosis not present

## 2023-10-01 DIAGNOSIS — K219 Gastro-esophageal reflux disease without esophagitis: Secondary | ICD-10-CM

## 2023-10-01 DIAGNOSIS — I10 Essential (primary) hypertension: Secondary | ICD-10-CM

## 2023-10-01 DIAGNOSIS — M79604 Pain in right leg: Secondary | ICD-10-CM | POA: Diagnosis not present

## 2023-10-01 DIAGNOSIS — M545 Low back pain, unspecified: Secondary | ICD-10-CM | POA: Diagnosis not present

## 2023-10-01 DIAGNOSIS — Z79891 Long term (current) use of opiate analgesic: Secondary | ICD-10-CM | POA: Diagnosis not present

## 2023-10-01 DIAGNOSIS — M810 Age-related osteoporosis without current pathological fracture: Secondary | ICD-10-CM

## 2023-10-01 DIAGNOSIS — E1142 Type 2 diabetes mellitus with diabetic polyneuropathy: Secondary | ICD-10-CM | POA: Diagnosis not present

## 2023-10-01 LAB — LIPID PANEL
Chol/HDL Ratio: 2 {ratio} (ref 0.0–4.4)
Cholesterol, Total: 102 mg/dL (ref 100–199)
HDL: 51 mg/dL (ref >39)
LDL Chol Calc (NIH): 36 mg/dL (ref 0–99)
Triglycerides: 71 mg/dL (ref 0–149)
VLDL Cholesterol Cal: 15 mg/dL (ref 5–40)

## 2023-10-01 LAB — BAYER DCA HB A1C WAIVED: HB A1C (BAYER DCA - WAIVED): 7.5 % — ABNORMAL HIGH (ref 4.8–5.6)

## 2023-10-01 LAB — CBC WITH DIFFERENTIAL/PLATELET
Basophils Absolute: 0.1 10*3/uL (ref 0.0–0.2)
Basos: 1 %
EOS (ABSOLUTE): 0.3 10*3/uL (ref 0.0–0.4)
Eos: 5 %
Hematocrit: 38.3 % (ref 34.0–46.6)
Hemoglobin: 12.3 g/dL (ref 11.1–15.9)
Immature Grans (Abs): 0 10*3/uL (ref 0.0–0.1)
Immature Granulocytes: 0 %
Lymphocytes Absolute: 1.6 10*3/uL (ref 0.7–3.1)
Lymphs: 24 %
MCH: 31.3 pg (ref 26.6–33.0)
MCHC: 32.1 g/dL (ref 31.5–35.7)
MCV: 98 fL — ABNORMAL HIGH (ref 79–97)
Monocytes Absolute: 0.5 10*3/uL (ref 0.1–0.9)
Monocytes: 7 %
Neutrophils Absolute: 4.1 10*3/uL (ref 1.4–7.0)
Neutrophils: 63 %
Platelets: 168 10*3/uL (ref 150–450)
RBC: 3.93 x10E6/uL (ref 3.77–5.28)
RDW: 11.4 % — ABNORMAL LOW (ref 11.7–15.4)
WBC: 6.5 10*3/uL (ref 3.4–10.8)

## 2023-10-01 LAB — CMP14+EGFR
ALT: 13 [IU]/L (ref 0–32)
AST: 10 [IU]/L (ref 0–40)
Albumin: 4 g/dL (ref 3.7–4.7)
Alkaline Phosphatase: 90 [IU]/L (ref 44–121)
BUN/Creatinine Ratio: 14 (ref 12–28)
BUN: 13 mg/dL (ref 8–27)
Bilirubin Total: 0.5 mg/dL (ref 0.0–1.2)
CO2: 26 mmol/L (ref 20–29)
Calcium: 8.9 mg/dL (ref 8.7–10.3)
Chloride: 106 mmol/L (ref 96–106)
Creatinine, Ser: 0.95 mg/dL (ref 0.57–1.00)
Globulin, Total: 2.3 g/dL (ref 1.5–4.5)
Glucose: 142 mg/dL — ABNORMAL HIGH (ref 70–99)
Potassium: 4.5 mmol/L (ref 3.5–5.2)
Sodium: 144 mmol/L (ref 134–144)
Total Protein: 6.3 g/dL (ref 6.0–8.5)
eGFR: 59 mL/min/{1.73_m2} — ABNORMAL LOW (ref >59)

## 2023-10-01 MED ORDER — LORAZEPAM 0.5 MG PO TABS: ORAL_TABLET | ORAL | 5 refills | Status: DC

## 2023-10-01 MED ORDER — ROSUVASTATIN CALCIUM 10 MG PO TABS: 10.0000 mg | ORAL_TABLET | ORAL | 1 refills | Status: DC

## 2023-10-01 MED ORDER — FARXIGA 10 MG PO TABS: ORAL_TABLET | ORAL | 1 refills | Status: DC

## 2023-10-01 MED ORDER — GLIPIZIDE ER 10 MG PO TB24: 10.0000 mg | ORAL_TABLET | Freq: Every day | ORAL | 1 refills | Status: DC

## 2023-10-01 MED ORDER — PANTOPRAZOLE SODIUM 40 MG PO TBEC: 40.0000 mg | DELAYED_RELEASE_TABLET | Freq: Every day | ORAL | 1 refills | Status: DC

## 2023-10-01 MED ORDER — IBUPROFEN 800 MG PO TABS: 800.0000 mg | ORAL_TABLET | Freq: Four times a day (QID) | ORAL | 2 refills | Status: DC | PRN

## 2023-10-01 MED ORDER — GABAPENTIN 100 MG PO CAPS: 100.0000 mg | ORAL_CAPSULE | Freq: Three times a day (TID) | ORAL | 5 refills | Status: DC | PRN

## 2023-10-01 NOTE — Progress Notes (Signed)
Subjective:    Patient ID: Catherine Cooper, female    DOB: 1941-01-16, 82 y.o.   MRN: [id number]   Chief Complaint: Medical Management of Chronic Issues    HPI:  Catherine Cooper is a 82 y.o. who identifies as a female who was assigned female at birth.   Social history: Lives with: husband Work history: retired   Water engineer in today for follow up of the following chronic medical issues:  1. Primary hypertension No c/o chest pain, sob or headache. Does not check blood pressure at home. BP Readings from Last 3 Encounters:  10/01/23 106/62  07/07/23 (!) 91/51  04/07/23 127/73     2. Type 2 diabetes mellitus with diabetic polyneuropathy, without long-term current use of insulin (HCC) Fasting blood sugars are running around 100-170.83 low blood sugars Lab Results  Component Value Date   HGBA1C 7.2 (H) 07/07/2023     3. Hyperlipidemia associated with type 2 diabetes mellitus (HCC) Does try to watch diet bit does no dedicated exercise. Lab Results  Component Value Date   CHOL 124 07/07/2023   HDL 55 07/07/2023   LDLCALC 47 07/07/2023   TRIG 122 07/07/2023   CHOLHDL 2.3 07/07/2023     4. GAD (generalized anxiety disorder) Is on ativan 1-2 x a week usually. Does not need daily    10/01/2023    8:47 AM 07/07/2023    9:15 AM 04/07/2023    9:25 AM 01/01/2023   10:37 AM  GAD 7 : Generalized Anxiety Score  Nervous, Anxious, on Edge 0 0 0 0  Control/stop worrying 0 0 0 0  Worry too much - different things 0 0 0 0  Trouble relaxing 0 0 0 0  Restless 0 0 0 0  Easily annoyed or irritable 0 0 0 0  Afraid - awful might happen 0 0 0 0  Total GAD 7 Score 0 0 0 0  Anxiety Difficulty Not difficult at all Not difficult at all Not difficult at all Not difficult at all      5. Gastroesophageal reflux disease without esophagitis Is on protonix daily and is doing weel.  6. Stage 3a chronic kidney disease (HCC) No voiding issues Lab Results  Component Value Date   CREATININE  0.91 07/07/2023      7. Age-related osteoporosis without current pathological fracture Last dexascan11/30/21. T score -2.7. does not really want to do anymore.  8. BMI 28.0-28.9,adult No recent weight changes Wt Readings from Last 3 Encounters:  10/01/23 142 lb 9.6 oz (64.7 kg)  09/01/23 142 lb (64.4 kg)  07/07/23 146 lb (66.2 kg)   BMI Readings from Last 3 Encounters:  10/01/23 23.73 kg/m  09/01/23 23.63 kg/m  07/07/23 24.30 kg/m      New complaints: Right foot has some neuropathy. Needs neurotin prescription increased.  Allergies  Allergen Reactions   Jardiance [Empagliflozin] Other (See Comments)    Legs eak   Lipitor [Atorvastatin]     fatique and myalgia   Penicillins Rash   Outpatient Encounter Medications as of 10/01/2023  Medication Sig   ALPHAGAN P 0.1 % SOLN Place 1 drop into both eyes 2 (two) times daily.    aspirin 81 MG tablet Take 81 mg by mouth every evening.    bifidobacterium infantis (ALIGN) capsule Take 1 capsule by mouth daily.   calcium citrate-vitamin D (CITRACAL+D) 315-200 MG-UNIT tablet Take 1 tablet by mouth 2 (two) times daily.   Cholecalciferol (VITAMIN D3) 2000 UNITS TABS Take 1 capsule  by mouth daily.    Dulaglutide (TRULICITY) 3 MG/0.5ML SOPN Inject 3 mg as directed once a week.   FARXIGA 10 MG TABS tablet TAKE (1) TABLET DAILY IN THE MORNING.   gabapentin (NEURONTIN) 100 MG capsule Take 1 capsule (100 mg total) by mouth daily.   glipiZIDE (GLIPIZIDE XL) 10 MG 24 hr tablet Take 1 tablet (10 mg total) by mouth daily with breakfast.   ibuprofen (ADVIL,MOTRIN) 800 MG tablet Take 1 tablet (800 mg total) by mouth every 6 (six) hours as needed for pain.   latanoprost (XALATAN) 0.005 % ophthalmic solution Place 1 drop into both eyes at bedtime.    LORazepam (ATIVAN) 0.5 MG tablet TAKE 1 TABLET TWICE DAILY AS NEEDED FOR ANXIETY OR SLEEP   Multiple Vitamins-Minerals (PRESERVISION/LUTEIN PO) Take by mouth.   ONETOUCH ULTRA test strip TEST BLOOD  SUGAR ONCE DAILYAND AS NEEDED   pantoprazole (PROTONIX) 40 MG tablet Take 1 tablet (40 mg total) by mouth daily.   rosuvastatin (CRESTOR) 10 MG tablet Take 1 tablet (10 mg total) by mouth 3 (three) times a week.   SSD 1 % cream    vitamin B-12 (CYANOCOBALAMIN) 1000 MCG tablet Take 1,000 mcg by mouth daily.     No facility-administered encounter medications on file as of 10/01/2023.    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;  Surgeon: Nadara Mustard, MD;  Location: [facility];  Service: Orthopedics;  Laterality: Right;   COLONOSCOPY  2013   EYE SURGERY     FOOT SURGERY     Right foot / left heel   glaucome surgery right eye     skin cancer removed right side of nose     SKIN GRAFT Right 2007   foot   TUBAL LIGATION      Family History  Problem Relation Age of Onset   Dementia Father    Diabetes Father    Coronary artery disease Father 49   Hip fracture Father    Alzheimer's disease Father    Atrial fibrillation Mother    Osteoporosis Mother       Controlled substance contract: n/a     Review of Systems  Constitutional:  Negative for diaphoresis.  Eyes:  Negative for pain.  Respiratory:  Negative for shortness of breath.   Cardiovascular:  Negative for chest pain, palpitations and leg swelling.  Gastrointestinal:  Negative for abdominal pain.  Endocrine: Negative for polydipsia.  Skin:  Negative for rash.  Neurological:  Negative for dizziness, weakness and headaches.  Hematological:  Does not bruise/bleed easily.  All other systems reviewed and are negative.      Objective:   Physical Exam Vitals and nursing note reviewed.   Constitutional:      General: She is not in acute distress.    Appearance: Normal appearance. She is well-developed.  HENT:     Head: Normocephalic.     Right Ear: Tympanic membrane normal.     Left Ear: Tympanic membrane normal.     Nose: Nose normal.     Mouth/Throat:     Mouth: Mucous membranes are moist.  Eyes:     Pupils: Pupils are  equal, round, and reactive to light.  Neck:     Vascular: No carotid bruit or JVD.  Cardiovascular:     Rate and Rhythm: Normal rate and regular rhythm.     Heart sounds: Normal heart sounds.  Pulmonary:     Effort: Pulmonary effort is normal. No respiratory distress.     Breath sounds: Normal breath sounds. No wheezing or rales.  Chest:     Chest wall: No tenderness.  Abdominal:     General: Bowel sounds are normal. There is no distension or abdominal bruit.     Palpations: Abdomen is soft. There is no hepatomegaly, splenomegaly, mass or pulsatile mass.     Tenderness: There is no abdominal tenderness.  Musculoskeletal:        General: Normal range of motion.     Cervical back: Normal range of motion and neck supple.  Lymphadenopathy:     Cervical: No cervical adenopathy.  Skin:    General: Skin is warm and dry.  Neurological:     Mental Status: She is alert and oriented to person, place, and time.     Deep Tendon Reflexes: Reflexes are normal and symmetric.  Psychiatric:        Behavior: Behavior normal.        Thought Content: Thought content normal.        Judgment: Judgment normal.     BP 106/62 (BP Location: Left Arm)   Pulse 87   Temp 97.9 F (36.6 C) (Temporal)   Ht 5\' 5"  (1.651 m)   Wt 142 lb 9.6 oz (64.7 kg)   SpO2 96%   BMI 23.73 kg/m   HGBA1c 7.5%      Assessment & Plan:   Catherine Cooper comes in today with chief complaint of Medical Management of Chronic Issues   Diagnosis and orders addressed:  1. Primary hypertension (Primary) Low sodium diet - CBC with Differential/Platelet - CMP14+EGFR  2. Type  2 diabetes mellitus with diabetic polyneuropathy, without long-term current use of insulin (HCC) Continue to watch carbs in diet - Bayer DCA Hb A1c Waived - FARXIGA 10 MG TABS tablet; TAKE (1) TABLET DAILY IN THE MORNING.  Dispense: 90 tablet; Refill: 1 - glipiZIDE (GLIPIZIDE XL) 10 MG 24 hr tablet; Take 1 tablet (10 mg total) by mouth daily with breakfast.  Dispense: 90 tablet; Refill: 1 - gabapentin (NEURONTIN) 100 MG capsule; Take 1 capsule (100 mg total) by mouth 3 (three) times daily as needed.  Dispense: 90 capsule; Refill: 5  3. Hyperlipidemia associated with type 2 diabetes mellitus (HCC) Low fat diet - Lipid panel - rosuvastatin (CRESTOR) 10 MG tablet; Take 1 tablet (10 mg total) by mouth 3 (three) times a week.  Dispense: 90 tablet; Refill: 1  4. GAD (generalized anxiety disorder) Stress management - ToxASSURE Select 13 (MW), Urine - LORazepam (ATIVAN) 0.5 MG tablet; TAKE 1 TABLET TWICE DAILY AS NEEDED FOR ANXIETY OR SLEEP  Dispense: 60 tablet; Refill: 5  5. Gastroesophageal reflux disease without esophagitis Avoid spicy foods Do not eat 2 hours prior to bedtime - pantoprazole (PROTONIX) 40 MG tablet; Take 1 tablet (40 mg total) by mouth daily.  Dispense: 90 tablet; Refill: 1  6. Stage 3a chronic kidney disease (HCC) Lab Results  Component Value Date   CREATININE 0.91 07/07/2023     7. Age-related osteoporosis without current pathological fracture Weight bearing exercises  8. BMI 28.0-28.9,adult Discussed diet and exercise for person with BMI >25 Will recheck weight in 3-6 months  9. Low back pain radiating to right leg - ibuprofen (ADVIL) 800 MG tablet; Take 1 tablet (800 mg total) by mouth every 6 (six) hours as needed.  Dispense: 30 tablet; Refill: 2   Labs pending Health Maintenance reviewed Diet and exercise encouraged  Follow up plan: 6 months   Mary-Margaret Daphine Deutscher, FNP

## 2023-10-01 NOTE — Patient Instructions (Signed)

## 2023-10-02 ENCOUNTER — Encounter: Payer: Self-pay | Admitting: Nurse Practitioner

## 2023-10-05 ENCOUNTER — Ambulatory Visit: Payer: BC Managed Care – PPO | Admitting: Nurse Practitioner

## 2023-10-06 LAB — TOXASSURE SELECT 13 (MW), URINE

## 2023-10-12 ENCOUNTER — Other Ambulatory Visit: Payer: Self-pay | Admitting: Nurse Practitioner

## 2023-10-12 DIAGNOSIS — H353122 Nonexudative age-related macular degeneration, left eye, intermediate dry stage: Secondary | ICD-10-CM | POA: Diagnosis not present

## 2023-10-12 DIAGNOSIS — E113492 Type 2 diabetes mellitus with severe nonproliferative diabetic retinopathy without macular edema, left eye: Secondary | ICD-10-CM | POA: Diagnosis not present

## 2023-10-12 DIAGNOSIS — Z1231 Encounter for screening mammogram for malignant neoplasm of breast: Secondary | ICD-10-CM

## 2023-10-12 DIAGNOSIS — E113491 Type 2 diabetes mellitus with severe nonproliferative diabetic retinopathy without macular edema, right eye: Secondary | ICD-10-CM | POA: Diagnosis not present

## 2023-10-12 DIAGNOSIS — H353114 Nonexudative age-related macular degeneration, right eye, advanced atrophic with subfoveal involvement: Secondary | ICD-10-CM | POA: Diagnosis not present

## 2023-10-12 DIAGNOSIS — H401130 Primary open-angle glaucoma, bilateral, stage unspecified: Secondary | ICD-10-CM | POA: Diagnosis not present

## 2023-11-23 ENCOUNTER — Ambulatory Visit
Admission: RE | Admit: 2023-11-23 | Discharge: 2023-11-23 | Disposition: A | Discharge status: Home / Self Care | Payer: Medicare Other | Source: Ambulatory Visit | Attending: Nurse Practitioner | Admitting: Nurse Practitioner

## 2023-11-23 DIAGNOSIS — H401130 Primary open-angle glaucoma, bilateral, stage unspecified: Secondary | ICD-10-CM | POA: Diagnosis not present

## 2023-11-23 DIAGNOSIS — H353122 Nonexudative age-related macular degeneration, left eye, intermediate dry stage: Secondary | ICD-10-CM | POA: Diagnosis not present

## 2023-11-23 DIAGNOSIS — E113492 Type 2 diabetes mellitus with severe nonproliferative diabetic retinopathy without macular edema, left eye: Secondary | ICD-10-CM | POA: Diagnosis not present

## 2023-11-23 DIAGNOSIS — Z1231 Encounter for screening mammogram for malignant neoplasm of breast: Secondary | ICD-10-CM

## 2023-11-23 DIAGNOSIS — H353114 Nonexudative age-related macular degeneration, right eye, advanced atrophic with subfoveal involvement: Secondary | ICD-10-CM | POA: Diagnosis not present

## 2023-11-23 DIAGNOSIS — E113491 Type 2 diabetes mellitus with severe nonproliferative diabetic retinopathy without macular edema, right eye: Secondary | ICD-10-CM | POA: Diagnosis not present

## 2023-11-26 DIAGNOSIS — M79676 Pain in unspecified toe(s): Secondary | ICD-10-CM | POA: Diagnosis not present

## 2023-11-26 DIAGNOSIS — B351 Tinea unguium: Secondary | ICD-10-CM | POA: Diagnosis not present

## 2023-11-26 DIAGNOSIS — E1142 Type 2 diabetes mellitus with diabetic polyneuropathy: Secondary | ICD-10-CM | POA: Diagnosis not present

## 2023-11-26 DIAGNOSIS — L84 Corns and callosities: Secondary | ICD-10-CM | POA: Diagnosis not present

## 2023-12-28 ENCOUNTER — Encounter: Payer: Self-pay | Admitting: Ophthalmology

## 2023-12-28 DIAGNOSIS — E113493 Type 2 diabetes mellitus with severe nonproliferative diabetic retinopathy without macular edema, bilateral: Secondary | ICD-10-CM | POA: Diagnosis not present

## 2023-12-28 DIAGNOSIS — H353122 Nonexudative age-related macular degeneration, left eye, intermediate dry stage: Secondary | ICD-10-CM | POA: Diagnosis not present

## 2023-12-28 DIAGNOSIS — H401130 Primary open-angle glaucoma, bilateral, stage unspecified: Secondary | ICD-10-CM | POA: Diagnosis not present

## 2023-12-28 DIAGNOSIS — H353114 Nonexudative age-related macular degeneration, right eye, advanced atrophic with subfoveal involvement: Secondary | ICD-10-CM | POA: Diagnosis not present

## 2023-12-28 LAB — HM DIABETES EYE EXAM

## 2024-01-25 ENCOUNTER — Other Ambulatory Visit: Payer: Self-pay | Admitting: Nurse Practitioner

## 2024-01-25 DIAGNOSIS — E1142 Type 2 diabetes mellitus with diabetic polyneuropathy: Secondary | ICD-10-CM

## 2024-02-02 DIAGNOSIS — E113493 Type 2 diabetes mellitus with severe nonproliferative diabetic retinopathy without macular edema, bilateral: Secondary | ICD-10-CM | POA: Diagnosis not present

## 2024-02-02 DIAGNOSIS — H353114 Nonexudative age-related macular degeneration, right eye, advanced atrophic with subfoveal involvement: Secondary | ICD-10-CM | POA: Diagnosis not present

## 2024-02-02 DIAGNOSIS — H401130 Primary open-angle glaucoma, bilateral, stage unspecified: Secondary | ICD-10-CM | POA: Diagnosis not present

## 2024-02-02 DIAGNOSIS — H353122 Nonexudative age-related macular degeneration, left eye, intermediate dry stage: Secondary | ICD-10-CM | POA: Diagnosis not present

## 2024-02-02 LAB — HM DIABETES EYE EXAM

## 2024-02-04 DIAGNOSIS — E1142 Type 2 diabetes mellitus with diabetic polyneuropathy: Secondary | ICD-10-CM | POA: Diagnosis not present

## 2024-02-04 DIAGNOSIS — L97522 Non-pressure chronic ulcer of other part of left foot with fat layer exposed: Secondary | ICD-10-CM | POA: Diagnosis not present

## 2024-02-09 DIAGNOSIS — M79675 Pain in left toe(s): Secondary | ICD-10-CM | POA: Diagnosis not present

## 2024-02-09 DIAGNOSIS — M86172 Other acute osteomyelitis, left ankle and foot: Secondary | ICD-10-CM | POA: Diagnosis not present

## 2024-02-18 DIAGNOSIS — E1142 Type 2 diabetes mellitus with diabetic polyneuropathy: Secondary | ICD-10-CM | POA: Diagnosis not present

## 2024-02-18 DIAGNOSIS — M86172 Other acute osteomyelitis, left ankle and foot: Secondary | ICD-10-CM | POA: Diagnosis not present

## 2024-03-03 DIAGNOSIS — L97521 Non-pressure chronic ulcer of other part of left foot limited to breakdown of skin: Secondary | ICD-10-CM | POA: Diagnosis not present

## 2024-03-08 ENCOUNTER — Encounter: Payer: Self-pay | Admitting: Ophthalmology

## 2024-03-08 DIAGNOSIS — H401130 Primary open-angle glaucoma, bilateral, stage unspecified: Secondary | ICD-10-CM | POA: Diagnosis not present

## 2024-03-08 DIAGNOSIS — E113492 Type 2 diabetes mellitus with severe nonproliferative diabetic retinopathy without macular edema, left eye: Secondary | ICD-10-CM | POA: Diagnosis not present

## 2024-03-08 DIAGNOSIS — H353122 Nonexudative age-related macular degeneration, left eye, intermediate dry stage: Secondary | ICD-10-CM | POA: Diagnosis not present

## 2024-03-08 DIAGNOSIS — E113491 Type 2 diabetes mellitus with severe nonproliferative diabetic retinopathy without macular edema, right eye: Secondary | ICD-10-CM | POA: Diagnosis not present

## 2024-03-08 DIAGNOSIS — H353114 Nonexudative age-related macular degeneration, right eye, advanced atrophic with subfoveal involvement: Secondary | ICD-10-CM | POA: Diagnosis not present

## 2024-03-08 LAB — HM DIABETES EYE EXAM

## 2024-03-21 ENCOUNTER — Encounter (HOSPITAL_BASED_OUTPATIENT_CLINIC_OR_DEPARTMENT_OTHER): Attending: Internal Medicine | Admitting: Internal Medicine

## 2024-03-21 DIAGNOSIS — E11621 Type 2 diabetes mellitus with foot ulcer: Secondary | ICD-10-CM | POA: Diagnosis not present

## 2024-03-21 DIAGNOSIS — E1142 Type 2 diabetes mellitus with diabetic polyneuropathy: Secondary | ICD-10-CM | POA: Insufficient documentation

## 2024-03-21 DIAGNOSIS — L97522 Non-pressure chronic ulcer of other part of left foot with fat layer exposed: Secondary | ICD-10-CM | POA: Diagnosis not present

## 2024-03-28 ENCOUNTER — Encounter (HOSPITAL_BASED_OUTPATIENT_CLINIC_OR_DEPARTMENT_OTHER): Admitting: Internal Medicine

## 2024-03-28 DIAGNOSIS — L97522 Non-pressure chronic ulcer of other part of left foot with fat layer exposed: Secondary | ICD-10-CM

## 2024-03-28 DIAGNOSIS — E11621 Type 2 diabetes mellitus with foot ulcer: Secondary | ICD-10-CM | POA: Diagnosis not present

## 2024-03-28 DIAGNOSIS — E1142 Type 2 diabetes mellitus with diabetic polyneuropathy: Secondary | ICD-10-CM | POA: Diagnosis not present

## 2024-04-01 ENCOUNTER — Ambulatory Visit (INDEPENDENT_AMBULATORY_CARE_PROVIDER_SITE_OTHER): Payer: BC Managed Care – PPO | Admitting: Nurse Practitioner

## 2024-04-01 ENCOUNTER — Encounter: Payer: Self-pay | Admitting: Nurse Practitioner

## 2024-04-01 ENCOUNTER — Ambulatory Visit: Payer: Medicare Other

## 2024-04-01 VITALS — BP 109/61 | HR 84 | Temp 97.9°F | Ht 65.0 in | Wt 141.0 lb

## 2024-04-01 DIAGNOSIS — M545 Low back pain, unspecified: Secondary | ICD-10-CM

## 2024-04-01 DIAGNOSIS — M810 Age-related osteoporosis without current pathological fracture: Secondary | ICD-10-CM

## 2024-04-01 DIAGNOSIS — E1142 Type 2 diabetes mellitus with diabetic polyneuropathy: Secondary | ICD-10-CM | POA: Diagnosis not present

## 2024-04-01 DIAGNOSIS — N1831 Chronic kidney disease, stage 3a: Secondary | ICD-10-CM

## 2024-04-01 DIAGNOSIS — K219 Gastro-esophageal reflux disease without esophagitis: Secondary | ICD-10-CM

## 2024-04-01 DIAGNOSIS — F411 Generalized anxiety disorder: Secondary | ICD-10-CM

## 2024-04-01 DIAGNOSIS — Z0001 Encounter for general adult medical examination with abnormal findings: Secondary | ICD-10-CM

## 2024-04-01 DIAGNOSIS — E785 Hyperlipidemia, unspecified: Secondary | ICD-10-CM | POA: Diagnosis not present

## 2024-04-01 DIAGNOSIS — E1169 Type 2 diabetes mellitus with other specified complication: Secondary | ICD-10-CM | POA: Diagnosis not present

## 2024-04-01 DIAGNOSIS — I10 Essential (primary) hypertension: Secondary | ICD-10-CM

## 2024-04-01 DIAGNOSIS — M79604 Pain in right leg: Secondary | ICD-10-CM | POA: Diagnosis not present

## 2024-04-01 DIAGNOSIS — Z6828 Body mass index (BMI) 28.0-28.9, adult: Secondary | ICD-10-CM

## 2024-04-01 DIAGNOSIS — Z0000 Encounter for general adult medical examination without abnormal findings: Secondary | ICD-10-CM

## 2024-04-01 LAB — BAYER DCA HB A1C WAIVED: HB A1C (BAYER DCA - WAIVED): 6.6 % — ABNORMAL HIGH (ref 4.8–5.6)

## 2024-04-01 LAB — LIPID PANEL

## 2024-04-01 MED ORDER — ROSUVASTATIN CALCIUM 10 MG PO TABS: 10.0000 mg | ORAL_TABLET | ORAL | 1 refills | Status: DC

## 2024-04-01 MED ORDER — GABAPENTIN 100 MG PO CAPS
100.0000 mg | ORAL_CAPSULE | Freq: Three times a day (TID) | ORAL | 5 refills | Status: AC | PRN
Start: 2024-04-01 — End: ?

## 2024-04-01 MED ORDER — IBUPROFEN 800 MG PO TABS: 800.0000 mg | ORAL_TABLET | Freq: Four times a day (QID) | ORAL | 2 refills | Status: AC | PRN

## 2024-04-01 MED ORDER — TRULICITY 3 MG/0.5ML ~~LOC~~ SOAJ: 3.0000 mg | SUBCUTANEOUS | 5 refills | Status: DC

## 2024-04-01 MED ORDER — FARXIGA 10 MG PO TABS: ORAL_TABLET | ORAL | 1 refills | Status: DC

## 2024-04-01 MED ORDER — GLIPIZIDE ER 10 MG PO TB24: 10.0000 mg | ORAL_TABLET | Freq: Every day | ORAL | 1 refills | Status: DC

## 2024-04-01 MED ORDER — PANTOPRAZOLE SODIUM 40 MG PO TBEC: 40.0000 mg | DELAYED_RELEASE_TABLET | Freq: Every day | ORAL | 1 refills | Status: DC

## 2024-04-01 NOTE — Patient Instructions (Signed)

## 2024-04-01 NOTE — Progress Notes (Signed)
 Subjective:    Patient ID: Catherine Cooper, female    DOB: 1941/06/18, 83 y.o.   MRN: [id number]   Chief Complaint: annual physical   HPI:  Catherine Cooper is a 83 y.o. who identifies as a female who was assigned female at birth.   Social history: Lives with: husband Work history: retired   Water engineer in today for follow up of the following chronic medical issues:  1. Primary hypertension No c/o chest pain, sob or headache. Does not check blood pressure at home. BP Readings from Last 3 Encounters:  10/01/23 106/62  07/07/23 (!) 91/51  04/07/23 127/73     2. Type 2 diabetes mellitus with diabetic polyneuropathy, without long-term current use of insulin (HCC) Fasting blood sugars are running around 100-170. No low blood sugars Lab Results  Component Value Date   HGBA1C 7.5 (H) 10/01/2023     3. Hyperlipidemia associated with type 2 diabetes mellitus (HCC) Does try to watch diet bit does no dedicated exercise. Lab Results  Component Value Date   CHOL 102 10/01/2023   HDL 51 10/01/2023   LDLCALC 36 10/01/2023   TRIG 71 10/01/2023   CHOLHDL 2.0 10/01/2023     4. GAD (generalized anxiety disorder) Is on ativan  1-2 x a week usually. Does not need daily    10/01/2023    8:47 AM 07/07/2023    9:15 AM 04/07/2023    9:25 AM 01/01/2023   10:37 AM  GAD 7 : Generalized Anxiety Score  Nervous, Anxious, on Edge 0 0 0 0  Control/stop worrying 0 0 0 0  Worry too much - different things 0 0 0 0  Trouble relaxing 0 0 0 0  Restless 0 0 0 0  Easily annoyed or irritable 0 0 0 0  Afraid - awful might happen 0 0 0 0  Total GAD 7 Score 0 0 0 0  Anxiety Difficulty Not difficult at all Not difficult at all Not difficult at all Not difficult at all      5. Gastroesophageal reflux disease without esophagitis Is on protonix  daily and is doing weel.  6. Stage 3a chronic kidney disease (HCC) No voiding issues Lab Results  Component Value Date   CREATININE 0.95 10/01/2023       7. Age-related osteoporosis without current pathological fracture Last dexascan11/30/21. T score -2.7. does not really want to do anymore.  8. BMI 28.0-28.9,adult No recent weight changes  Wt Readings from Last 3 Encounters:  04/01/24 141 lb (64 kg)  10/01/23 142 lb 9.6 oz (64.7 kg)  09/01/23 142 lb (64.4 kg)   BMI Readings from Last 3 Encounters:  04/01/24 23.46 kg/m  10/01/23 23.73 kg/m  09/01/23 23.63 kg/m       New complaints: None today  Allergies  Allergen Reactions   Jardiance  [Empagliflozin ] Other (See Comments)    Legs eak   Lipitor [Atorvastatin]     fatique and myalgia   Penicillins Rash   Outpatient Encounter Medications as of 04/01/2024  Medication Sig   ALPHAGAN P 0.1 % SOLN Place 1 drop into both eyes 2 (two) times daily.    aspirin 81 MG tablet Take 81 mg by mouth every evening.    bifidobacterium infantis (ALIGN) capsule Take 1 capsule by mouth daily.   calcium  citrate-vitamin D (CITRACAL+D) 315-200 MG-UNIT tablet Take 1 tablet by mouth 2 (two) times daily.   Cholecalciferol (VITAMIN D3) 2000 UNITS TABS Take 1 capsule by mouth daily.    Dulaglutide  (TRULICITY )  3 MG/0.5ML SOPN Inject 3 mg as directed once a week.   FARXIGA  10 MG TABS tablet TAKE (1) TABLET DAILY IN THE MORNING.   gabapentin  (NEURONTIN ) 100 MG capsule Take 1 capsule (100 mg total) by mouth 3 (three) times daily as needed.   glipiZIDE  (GLIPIZIDE  XL) 10 MG 24 hr tablet Take 1 tablet (10 mg total) by mouth daily with breakfast.   ibuprofen  (ADVIL ) 800 MG tablet Take 1 tablet (800 mg total) by mouth every 6 (six) hours as needed.   latanoprost (XALATAN) 0.005 % ophthalmic solution Place 1 drop into both eyes at bedtime.    LORazepam  (ATIVAN ) 0.5 MG tablet TAKE 1 TABLET TWICE DAILY AS NEEDED FOR ANXIETY OR SLEEP   Multiple Vitamins-Minerals (PRESERVISION/LUTEIN PO) Take by mouth.   ONETOUCH ULTRA test strip TEST BLOOD SUGAR ONCE DAILY AND AS NEEDED Dx W09.8119   pantoprazole   (PROTONIX ) 40 MG tablet Take 1 tablet (40 mg total) by mouth daily.   rosuvastatin  (CRESTOR ) 10 MG tablet Take 1 tablet (10 mg total) by mouth 3 (three) times a week.   SSD 1 % cream    vitamin B-12 (CYANOCOBALAMIN) 1000 MCG tablet Take 1,000 mcg by mouth daily.     No facility-administered encounter medications on file as of 04/01/2024.    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;  Surgeon: Timothy Ford, MD;  Location[facility] OR;  Service: Orthopedics;  Laterality: Right;   COLONOSCOPY  2013   EYE SURGERY     FOOT SURGERY     Right foot / left heel   glaucome surgery right eye     skin cancer removed right side of nose     SKIN GRAFT Right 2007   foot   TUBAL LIGATION      Family History  Problem Relation Age of Onset   Atrial fibrillation Mother    Osteoporosis Mother    Dementia Father    Diabetes Father    Coronary artery disease Father 66   Hip fracture Father    Alzheimer's disease Father    Breast cancer Neg Hx       Controlled substance contract: n/a     Review of Systems  Constitutional:  Negative for diaphoresis.  Eyes:  Negative for pain.  Respiratory:  Negative for shortness of breath.   Cardiovascular:  Negative for chest pain, palpitations and leg swelling.  Gastrointestinal:  Negative for abdominal pain.  Endocrine: Negative for polydipsia.  Skin:  Negative for rash.  Neurological:  Negative for dizziness, weakness and headaches.  Hematological:  Does not bruise/bleed easily.  All other systems reviewed and are negative.      Objective:   Physical Exam Vitals and nursing note reviewed.  Constitutional:      General: She  is not in acute distress.    Appearance: Normal appearance. She is well-developed.  HENT:     Head: Normocephalic.     Right Ear: Tympanic membrane normal.     Left Ear: Tympanic membrane normal.     Nose: Nose normal.     Mouth/Throat:     Mouth: Mucous membranes are moist.   Eyes:  Pupils: Pupils are equal, round, and reactive to light.   Neck:     Vascular: No carotid bruit or JVD.   Cardiovascular:     Rate and Rhythm: Normal rate and regular rhythm.     Heart sounds: Normal heart sounds.  Pulmonary:     Effort: Pulmonary effort is normal. No respiratory distress.     Breath sounds: Normal breath sounds. No wheezing or rales.  Chest:     Chest wall: No tenderness.  Abdominal:     General: Bowel sounds are normal. There is no distension or abdominal bruit.     Palpations: Abdomen is soft. There is no hepatomegaly, splenomegaly, mass or pulsatile mass.     Tenderness: There is no abdominal tenderness.   Musculoskeletal:        General: Normal range of motion.     Cervical back: Normal range of motion and neck supple.  Lymphadenopathy:     Cervical: No cervical adenopathy.   Skin:    General: Skin is warm and dry.   Neurological:     Mental Status: She is alert and oriented to person, place, and time.     Deep Tendon Reflexes: Reflexes are normal and symmetric.   Psychiatric:        Behavior: Behavior normal.        Thought Content: Thought content normal.        Judgment: Judgment normal.     There were no vitals taken for this visit.  HGBA1c 6.6%      Assessment & Plan:   Catherine Cooper comes in today with chief complaint of annual physical  Diagnosis and orders addressed:  1. Primary hypertension (Primary) Low sodium diet - CBC with Differential/Platelet - CMP14+EGFR  2. Type 2 diabetes mellitus with diabetic polyneuropathy, without long-term current use of insulin (HCC) Continue to watch carbs in diet - Bayer DCA Hb A1c Waived - FARXIGA  10  MG TABS tablet; TAKE (1) TABLET DAILY IN THE MORNING.  Dispense: 90 tablet; Refill: 1 - glipiZIDE  (GLIPIZIDE  XL) 10 MG 24 hr tablet; Take 1 tablet (10 mg total) by mouth daily with breakfast.  Dispense: 90 tablet; Refill: 1 - gabapentin  (NEURONTIN ) 100 MG capsule; Take 1 capsule (100 mg total) by mouth 3 (three) times daily as needed.  Dispense: 90 capsule; Refill: 5  3. Hyperlipidemia associated with type 2 diabetes mellitus (HCC) Low fat diet - Lipid panel - rosuvastatin  (CRESTOR ) 10 MG tablet; Take 1 tablet (10 mg total) by mouth 3 (three) times a week.  Dispense: 90 tablet; Refill: 1  4. GAD (generalized anxiety disorder) Stress management - ToxASSURE Select 13 (MW), Urine - LORazepam  (ATIVAN ) 0.5 MG tablet; TAKE 1 TABLET TWICE DAILY AS NEEDED FOR ANXIETY OR SLEEP  Dispense: 60 tablet; Refill: 5  5. Gastroesophageal reflux disease without esophagitis Avoid spicy foods Do not eat 2 hours prior to bedtime - pantoprazole  (PROTONIX ) 40 MG tablet; Take 1 tablet (40 mg total) by mouth daily.  Dispense: 90 tablet; Refill: 1  6. Stage 3a chronic kidney disease (HCC) Lab Results  Component Value Date   CREATININE 0.95 10/01/2023     7. Age-related osteoporosis without current pathological fracture Weight bearing exercises  8. BMI 28.0-28.9,adult Discussed diet and exercise for person with BMI >25 Will recheck weight in 3-6 months   Labs pending Health Maintenance reviewed Diet and exercise encouraged  Follow up plan: 6 months   Mary-Margaret Gaylyn Keas, FNP

## 2024-04-02 LAB — CBC WITH DIFFERENTIAL/PLATELET
Basophils Absolute: 0.1 10*3/uL (ref 0.0–0.2)
Basos: 1 %
EOS (ABSOLUTE): 0.4 10*3/uL (ref 0.0–0.4)
Eos: 7 %
Hematocrit: 38.7 % (ref 34.0–46.6)
Hemoglobin: 12.4 g/dL (ref 11.1–15.9)
Immature Grans (Abs): 0 10*3/uL (ref 0.0–0.1)
Immature Granulocytes: 0 %
Lymphocytes Absolute: 1.7 10*3/uL (ref 0.7–3.1)
Lymphs: 29 %
MCH: 31.5 pg (ref 26.6–33.0)
MCHC: 32 g/dL (ref 31.5–35.7)
MCV: 98 fL — ABNORMAL HIGH (ref 79–97)
Monocytes Absolute: 0.5 10*3/uL (ref 0.1–0.9)
Monocytes: 8 %
Neutrophils Absolute: 3.2 10*3/uL (ref 1.4–7.0)
Neutrophils: 55 %
Platelets: 135 10*3/uL — ABNORMAL LOW (ref 150–450)
RBC: 3.94 x10E6/uL (ref 3.77–5.28)
RDW: 11.9 % (ref 11.7–15.4)
WBC: 5.7 10*3/uL (ref 3.4–10.8)

## 2024-04-02 LAB — LIPID PANEL
Chol/HDL Ratio: 2 ratio (ref 0.0–4.4)
Cholesterol, Total: 110 mg/dL (ref 100–199)
HDL: 55 mg/dL (ref >39)
LDL Chol Calc (NIH): 38 mg/dL (ref 0–99)
Triglycerides: 88 mg/dL (ref 0–149)
VLDL Cholesterol Cal: 17 mg/dL (ref 5–40)

## 2024-04-02 LAB — CMP14+EGFR
ALT: 15 IU/L (ref 0–32)
AST: 15 IU/L (ref 0–40)
Albumin: 4 g/dL (ref 3.7–4.7)
Alkaline Phosphatase: 87 IU/L (ref 44–121)
BUN/Creatinine Ratio: 15 (ref 12–28)
BUN: 14 mg/dL (ref 8–27)
Bilirubin Total: 0.8 mg/dL (ref 0.0–1.2)
CO2: 24 mmol/L (ref 20–29)
Calcium: 9.1 mg/dL (ref 8.7–10.3)
Chloride: 104 mmol/L (ref 96–106)
Creatinine, Ser: 0.96 mg/dL (ref 0.57–1.00)
Globulin, Total: 2.5 g/dL (ref 1.5–4.5)
Glucose: 148 mg/dL — ABNORMAL HIGH (ref 70–99)
Potassium: 4.4 mmol/L (ref 3.5–5.2)
Sodium: 143 mmol/L (ref 134–144)
Total Protein: 6.5 g/dL (ref 6.0–8.5)
eGFR: 59 mL/min/{1.73_m2} — ABNORMAL LOW (ref >59)

## 2024-04-05 ENCOUNTER — Ambulatory Visit: Payer: Self-pay | Admitting: Nurse Practitioner

## 2024-04-05 ENCOUNTER — Encounter (HOSPITAL_BASED_OUTPATIENT_CLINIC_OR_DEPARTMENT_OTHER): Admitting: Internal Medicine

## 2024-04-05 DIAGNOSIS — E1142 Type 2 diabetes mellitus with diabetic polyneuropathy: Secondary | ICD-10-CM | POA: Diagnosis not present

## 2024-04-05 DIAGNOSIS — E11621 Type 2 diabetes mellitus with foot ulcer: Secondary | ICD-10-CM | POA: Diagnosis not present

## 2024-04-05 DIAGNOSIS — L97522 Non-pressure chronic ulcer of other part of left foot with fat layer exposed: Secondary | ICD-10-CM | POA: Diagnosis not present

## 2024-04-11 ENCOUNTER — Encounter (HOSPITAL_BASED_OUTPATIENT_CLINIC_OR_DEPARTMENT_OTHER): Admitting: Internal Medicine

## 2024-04-11 DIAGNOSIS — L97522 Non-pressure chronic ulcer of other part of left foot with fat layer exposed: Secondary | ICD-10-CM | POA: Diagnosis not present

## 2024-04-11 DIAGNOSIS — E1142 Type 2 diabetes mellitus with diabetic polyneuropathy: Secondary | ICD-10-CM | POA: Diagnosis not present

## 2024-04-11 DIAGNOSIS — E11621 Type 2 diabetes mellitus with foot ulcer: Secondary | ICD-10-CM | POA: Diagnosis not present

## 2024-04-12 DIAGNOSIS — H401212 Low-tension glaucoma, right eye, moderate stage: Secondary | ICD-10-CM | POA: Diagnosis not present

## 2024-04-12 DIAGNOSIS — H401221 Low-tension glaucoma, left eye, mild stage: Secondary | ICD-10-CM | POA: Diagnosis not present

## 2024-04-14 ENCOUNTER — Other Ambulatory Visit: Payer: Self-pay

## 2024-04-14 ENCOUNTER — Telehealth: Payer: Self-pay | Admitting: Nurse Practitioner

## 2024-04-14 DIAGNOSIS — E113493 Type 2 diabetes mellitus with severe nonproliferative diabetic retinopathy without macular edema, bilateral: Secondary | ICD-10-CM | POA: Diagnosis not present

## 2024-04-14 DIAGNOSIS — H401130 Primary open-angle glaucoma, bilateral, stage unspecified: Secondary | ICD-10-CM | POA: Diagnosis not present

## 2024-04-14 DIAGNOSIS — H353114 Nonexudative age-related macular degeneration, right eye, advanced atrophic with subfoveal involvement: Secondary | ICD-10-CM | POA: Diagnosis not present

## 2024-04-14 DIAGNOSIS — H353122 Nonexudative age-related macular degeneration, left eye, intermediate dry stage: Secondary | ICD-10-CM | POA: Diagnosis not present

## 2024-04-14 DIAGNOSIS — N1831 Chronic kidney disease, stage 3a: Secondary | ICD-10-CM

## 2024-04-14 NOTE — Telephone Encounter (Signed)
Orders placed.  -LS

## 2024-04-14 NOTE — Telephone Encounter (Signed)
 Patient has appt on 7-8 to recheck labs and needs orders in.

## 2024-04-19 ENCOUNTER — Other Ambulatory Visit

## 2024-04-19 DIAGNOSIS — N1831 Chronic kidney disease, stage 3a: Secondary | ICD-10-CM

## 2024-04-19 LAB — CBC WITH DIFFERENTIAL/PLATELET
Basophils Absolute: 0.1 x10E3/uL (ref 0.0–0.2)
Basos: 1 %
EOS (ABSOLUTE): 0.5 x10E3/uL — ABNORMAL HIGH (ref 0.0–0.4)
Eos: 9 %
Hematocrit: 36.4 % (ref 34.0–46.6)
Hemoglobin: 11 g/dL — ABNORMAL LOW (ref 11.1–15.9)
Immature Grans (Abs): 0 x10E3/uL (ref 0.0–0.1)
Immature Granulocytes: 0 %
Lymphocytes Absolute: 2 x10E3/uL (ref 0.7–3.1)
Lymphs: 37 %
MCH: 30.1 pg (ref 26.6–33.0)
MCHC: 30.2 g/dL — ABNORMAL LOW (ref 31.5–35.7)
MCV: 100 fL — ABNORMAL HIGH (ref 79–97)
Monocytes Absolute: 0.5 x10E3/uL (ref 0.1–0.9)
Monocytes: 9 %
Neutrophils Absolute: 2.4 x10E3/uL (ref 1.4–7.0)
Neutrophils: 44 %
Platelets: 127 x10E3/uL — ABNORMAL LOW (ref 150–450)
RBC: 3.66 x10E6/uL — ABNORMAL LOW (ref 3.77–5.28)
RDW: 12 % (ref 11.7–15.4)
WBC: 5.5 x10E3/uL (ref 3.4–10.8)

## 2024-04-21 ENCOUNTER — Ambulatory Visit: Payer: Self-pay | Admitting: Nurse Practitioner

## 2024-04-22 ENCOUNTER — Ambulatory Visit: Admitting: Nurse Practitioner

## 2024-04-22 ENCOUNTER — Encounter: Payer: Self-pay | Admitting: Nurse Practitioner

## 2024-04-22 VITALS — BP 138/74 | HR 91 | Temp 97.8°F | Ht 65.0 in | Wt 143.0 lb

## 2024-04-22 DIAGNOSIS — D696 Thrombocytopenia, unspecified: Secondary | ICD-10-CM | POA: Diagnosis not present

## 2024-04-22 DIAGNOSIS — H6123 Impacted cerumen, bilateral: Secondary | ICD-10-CM | POA: Diagnosis not present

## 2024-04-22 NOTE — Progress Notes (Signed)
   Subjective:    Patient ID: Catherine Cooper, female    DOB: October 03, 1941, 83 y.o.   MRN: [id number]   Chief Complaint: Can't hear out of right ear   HPI Patient comes in with 2 issues: - can't hear out of right ear. Thinks has wax in it. - GI referral- hgb low- platelets are also low- probably needs to see hematology rather than GI. 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) 08/15/2020   Gastroesophageal reflux disease without esophagitis 12/12/2015   Chronic kidney disease, stage III (moderate) (HCC) 12/12/2015   Diarrhea 06/04/2015   BMI 28.0-28.9,adult 05/09/2015   Hyperlipidemia associated with type 2 diabetes mellitus (HCC) 03/21/2013   History of endometrial cancer 12/04/2011   HTN (hypertension) 09/16/2011   Type 2 diabetes mellitus with diabetic polyneuropathy, without long-term current use of insulin (HCC) 01/11/2011   Osteoporosis 01/11/2011   Glaucoma 01/11/2011   GAD (generalized anxiety disorder) 01/11/2011       Review of Systems  Constitutional:  Negative for diaphoresis.  Eyes:  Negative for pain.  Respiratory:  Negative for shortness of breath.   Cardiovascular:  Negative for chest pain, palpitations and leg swelling.  Gastrointestinal:  Negative for abdominal pain.  Endocrine: Negative for polydipsia.  Skin:  Negative for rash.  Neurological:  Negative for dizziness, weakness and headaches.  Hematological:  Does not bruise/bleed easily.  All other systems reviewed and are negative.      Objective:   Physical Exam Constitutional:      Appearance: Normal appearance.  Cardiovascular:     Rate and Rhythm: Normal rate and regular rhythm.     Heart sounds: Normal heart sounds.  Pulmonary:      Breath sounds: Normal breath sounds.  Skin:    General: Skin is warm.  Neurological:     General: No focal deficit present.     Mental Status: She is alert and oriented to person, place, and time.  Psychiatric:        Mood and Affect: Mood normal.        Behavior: Behavior normal.    BP 138/74   Pulse 91   Temp 97.8 F (36.6 C) (Temporal)   Ht 5' 5 (1.651 m)   Wt 143 lb (64.9 kg)   SpO2 100%   BMI 23.80 kg/m    S/P ear lavage- TM's normal     Assessment & Plan:   Catherine Cooper in today with chief complaint of Can't hear out of right ear   1. Low platelet count (HCC) (Primary) Referral made - Ambulatory referral to Hematology / Oncology  2. Bilateral impacted cerumen Debrox- 2-3x a week RTO prn    The above assessment and management plan was discussed with the patient. The patient verbalized understanding of and has agreed to the management plan. Patient is aware to call the clinic if symptoms persist or worsen. Patient is aware when to return to the clinic for a follow-up visit. Patient educated on when it is appropriate to go to the emergency department.   Mary-Margaret Gladis, FNP

## 2024-04-22 NOTE — Patient Instructions (Signed)
 Earwax Buildup, Adult Your ears make something called earwax. It helps keep germs called bacteria away and protects the skin in your ears. Sometimes, too much earwax can build up. This can cause discomfort or make it harder to hear. What are the causes? Earwax buildup can happen when you have too much earwax in your ears. Earwax is made in the outer part of your ear canal. It's supposed to fall out in small amounts over time. But if your ears aren't able to clean themselves like they should, earwax can build up. What increases the risk? You're more likely to get earwax buildup if: You clean your ears with cotton swabs. You pick at your ears. You use earplugs or in-ear headphones a lot. You wear hearing aids. You may also be more likely to get it if: You're female. You're older. Your ears naturally make more earwax. You have narrow ear canals or extra hair in your ears. Your earwax is too thick or sticky. You have eczema. You're dehydrated. This means there's not enough fluid in your body. What are the signs or symptoms? Symptoms of earwax buildup include: Not being able to hear as well. A feeling of fullness in your ear. Feeling like your ear is plugged. Fluid coming from your ear. Ear pain or an itchy ear. Ringing in your ear. Coughing or problems with balance. How is this diagnosed? Earwax buildup may be diagnosed based on your symptoms, medical history, and an ear exam. During the exam, your health care provider will look into your ear with a tool called an otoscope. You may also have tests, such as a hearing test. How is this treated? Earwax buildup may be treated by: Using ear drops. Having the earwax removed by a provider. The provider may: Flush the ear with water. Use a tool called a curette that has a loop on the end. Use a suction device. Having surgery. This may be done in severe cases. Follow these instructions at home:  Cleaning your ears Clean your ears as told  by your provider. You can clean the outside of your ears with a washcloth or tissue. Do not overclean your ears. Do not put anything into your ear unless told. This includes cotton swabs. General instructions Take over-the-counter and prescription medicines only as told by your provider. Drink enough fluid to keep your pee (urine) pale yellow. This helps thin the earwax. If you have hearing aids, clean them as told. Keep all follow-up visits. If earwax builds up in your ears often or if you use hearing aids, ask your provider how often you should have your ears cleaned. Contact a health care provider if: Your ear pain gets worse. You have a fever. You have pus, blood, or other fluid coming from your ear. You have hearing loss. You have ringing in your ears that won't go away. You feel like the room is spinning. This is called vertigo. Your symptoms don't get better with treatment. This information is not intended to replace advice given to you by your health care provider. Make sure you discuss any questions you have with your health care provider. Document Revised: 12/11/2022 Document Reviewed: 12/11/2022 Elsevier Patient Education  2024 [employer].

## 2024-04-25 ENCOUNTER — Encounter (HOSPITAL_BASED_OUTPATIENT_CLINIC_OR_DEPARTMENT_OTHER): Attending: Internal Medicine | Admitting: Internal Medicine

## 2024-04-25 DIAGNOSIS — E11621 Type 2 diabetes mellitus with foot ulcer: Secondary | ICD-10-CM | POA: Diagnosis not present

## 2024-04-25 DIAGNOSIS — Z09 Encounter for follow-up examination after completed treatment for conditions other than malignant neoplasm: Secondary | ICD-10-CM | POA: Insufficient documentation

## 2024-04-25 DIAGNOSIS — L97522 Non-pressure chronic ulcer of other part of left foot with fat layer exposed: Secondary | ICD-10-CM | POA: Diagnosis not present

## 2024-05-05 DIAGNOSIS — B351 Tinea unguium: Secondary | ICD-10-CM | POA: Diagnosis not present

## 2024-05-05 DIAGNOSIS — E1142 Type 2 diabetes mellitus with diabetic polyneuropathy: Secondary | ICD-10-CM | POA: Diagnosis not present

## 2024-05-05 DIAGNOSIS — M79674 Pain in right toe(s): Secondary | ICD-10-CM | POA: Diagnosis not present

## 2024-05-05 DIAGNOSIS — M79675 Pain in left toe(s): Secondary | ICD-10-CM | POA: Diagnosis not present

## 2024-05-05 DIAGNOSIS — L84 Corns and callosities: Secondary | ICD-10-CM | POA: Diagnosis not present

## 2024-05-19 ENCOUNTER — Encounter: Payer: Self-pay | Admitting: Nurse Practitioner

## 2024-05-19 DIAGNOSIS — H353122 Nonexudative age-related macular degeneration, left eye, intermediate dry stage: Secondary | ICD-10-CM | POA: Diagnosis not present

## 2024-05-19 DIAGNOSIS — H353114 Nonexudative age-related macular degeneration, right eye, advanced atrophic with subfoveal involvement: Secondary | ICD-10-CM | POA: Diagnosis not present

## 2024-05-19 DIAGNOSIS — E113491 Type 2 diabetes mellitus with severe nonproliferative diabetic retinopathy without macular edema, right eye: Secondary | ICD-10-CM | POA: Diagnosis not present

## 2024-05-19 DIAGNOSIS — H401130 Primary open-angle glaucoma, bilateral, stage unspecified: Secondary | ICD-10-CM | POA: Diagnosis not present

## 2024-05-19 DIAGNOSIS — E113492 Type 2 diabetes mellitus with severe nonproliferative diabetic retinopathy without macular edema, left eye: Secondary | ICD-10-CM | POA: Diagnosis not present

## 2024-05-29 NOTE — Progress Notes (Unsigned)
 [facility] CONSULT NOTE  Patient Care Team: Gladis Mustard, FNP as PCP - General (Nurse Practitioner) Roddie Bring, DPM as Consulting Physician (Podiatry) Elner, Arley LABOR, MD as Consulting Physician (Ophthalmology) Cleatus Collar, MD as Consulting Physician (Ophthalmology) Livingston Rigg, MD as Consulting Physician (Dermatology) Billee Mliss BIRCH, [facility] (Pharmacist)  ASSESSMENT & PLAN 83 y.o.female with history of *** referred to Hematology for thrombocytopenia.  The etiology is not clear at this time and requires further testing and investigation. Thrombocytopenia with elevated MCV.  Assessment & Plan   CBC, blood smear to rule out clumping HIV, hepatitis C ab and CMP, LDH B12, copper, folate PT, APTT History of thrombosis with thrombocytopenia will rule out PNH ANA if signs or symptoms of autoimmune disease Bone marrow aspiration with biopsy if other causes ruled out and primary hematologic disorder is suspected  No orders of the defined types were placed in this encounter.  All questions were answered. The patient knows to call the clinic with any problems, questions or concerns. No barriers to learning was detected. The total time spent in the appointment was {CHL ONC TIME VISIT - DTPQU:[id number]} encounter with patients including review of chart and various tests results, discussions about plan of care and coordination of care plan  Catherine JAYSON Chihuahua, MD 05/29/2024 10:05 PM  CHIEF COMPLAINTS/PURPOSE OF CONSULTATION:  ***  HISTORY OF PRESENTING ILLNESS:  Catherine Cooper 83 y.o. female is here because of thrombocytopenia.  ***She was found to have abnormal CBC from *** ***She denies recent bruising/bleeding, such as spontaneous epistaxis, hematuria, melena or hematochezia ***She denies recent excessive menorrhagia The patient denies history of liver disease, exposure to heparin, history of cardiac murmur/prior cardiovascular surgery or recent new  medications She denies prior blood or platelet transfusions  Drug induced thrombocytopenia: {Yes/No:30480221}: Heparin {Yes/No:30480221}: Quinine as over-the-counter tablet for leg cramps or tonic water, gin and tonic {Yes/No:30480221}: Sulfonamide {Yes/No:30480221}: Acetaminophen  {Yes/No:30480221}: Cimetidine {Yes/No:30480221}: Ibuprofen  or other NSAIDs {Yes/No:30480221}: Ampicillin {Yes/No:30480221}: Piperacillin {Yes/No:30480221}: Vancomycin {Yes/No:30480221}: GP IIb/IIIa inhibitor {Yes/No:30480221}: Seizure medication: Carbamazepine, phenytoin  Infectious cause: {Yes/No:30480221}: History of HIV {Yes/No:30480221}: Hepatitis C {Yes/No:30480221}: EBV or infectious mononucleosis {Yes/No:30480221}: H pylori {Yes/No:30480221}: Recent sepsis or infection {Yes/No:30480221}: Traveling history suggest parasites exposure  {Yes/No:30480221}: Heavy alcohol use {Yes/No:30480221}: Dietary imbalance: Vitamin B12, folate, copper deficiency {Yes/No:30480221}: Autoimmune disease {Yes/No:30480221}: Pregnancy {Yes/No:30480221}: Family history of thrombocytopenia {Yes/No:30480221}: History of thrombosis {Yes/No:30480221}: History of bleeding   MEDICAL HISTORY:  Past Medical History:  Diagnosis Date   Achilles rupture, left    Anxiety    Breast cancer (HCC)    Cataracts, bilateral    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)    left foot   Diabetes mellitus    type II   Diabetic neuropathy (HCC)    Diarrhea    attributed to diabetic meds   Endometrioid adenocarcinoma    GAD (generalized anxiety disorder)    GERD (gastroesophageal reflux disease)    Glaucoma    Hyperlipidemia    not on medications   Iron deficiency    Nephrolithiasis 2000   Neuropathy    both feet   Osteoporosis    Shingles 2004   Squamous cell carcinoma of skin 03/20/2020   dorsum of nose   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   Right Upper Nose tx cx3 75fu     SURGICAL HISTORY: Past Surgical History:  Procedure Laterality Date    vitrectomy  02/23/08   posterior; right eye   ABDOMINAL HYSTERECTOMY  ACHILLES TENDON SURGERY Left 03/28/2016   Procedure: Reconstruction Left Achilles;  Surgeon: Jerona LULLA Sage, MD;  Location: [facility] OR;  Service: Orthopedics;  Laterality: Left;   AMPUTATION Right 02/14/2015   Procedure: 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;  Surgeon: Jerona Sage LULLA, MD;  Location: [facility] OR;  Service: Orthopedics;  Laterality: Right;   COLONOSCOPY  2013   EYE SURGERY     FOOT SURGERY     Right foot / left heel   glaucome surgery right eye     skin cancer removed right side of nose     SKIN GRAFT Right 2007   foot   TUBAL LIGATION      SOCIAL HISTORY: Social History   Socioeconomic History   Marital status: Married    Spouse name: Charles   Number of children: 3   Years of education: 12   Highest education level: Some college, no degree  Occupational History   Occupation: retired    Comment: Scientific laboratory technician of Investigation  Tobacco Use   Smoking status: Never   Smokeless tobacco: Never  Vaping Use   Vaping status: Never Used  Substance and Sexual Activity   Alcohol use: No   Drug use: No   Sexual activity: Not on file  Other Topics Concern   Not on file  Social History Narrative   2 healthy daughters and 1 son deceased (suicide @ 56)   Daughter lives nearby plus 2 grandchildren - one daughter in [place]   Social Drivers of Health   Financial Resource Strain: Low Risk  (09/01/2023)   Overall Financial Resource Strain (CARDIA)    Difficulty of Paying Living Expenses: Not hard at all  Food Insecurity: No Food Insecurity (09/01/2023)   Hunger Vital Sign    Worried About Running Out of Food in the Last Year: Never true    Ran Out of Food in the Last Year: Never true  Transportation Needs: No Transportation Needs (09/01/2023)   PRAPARE - Administrator, Civil Service (Medical): No    Lack of Transportation  (Non-Medical): No  Physical Activity: Inactive (09/01/2023)   Exercise Vital Sign    Days of Exercise per Week: 0 days    Minutes of Exercise per Session: 0 min  Stress: No Stress Concern Present (09/01/2023)   [employer] of Occupational Health - Occupational Stress Questionnaire    Feeling of Stress : Not at all  Social Connections: Socially Integrated (09/01/2023)   Social Connection and Isolation Panel    Frequency of Communication with Friends and Family: More than three times a week    Frequency of Social Gatherings with Friends and Family: More than three times a week    Attends Religious Services: More than 4 times per year    Active Member of [employer] or Organizations: Yes    Attends Engineer, structural: More than 4 times per year    Marital Status: Married  Catering manager Violence: Not At Risk (09/01/2023)   Humiliation, Afraid, Rape, and Kick questionnaire    Fear of Current or Ex-Partner: No    Emotionally Abused: No    Physically Abused: No    Sexually Abused: No    FAMILY HISTORY: Family History  Problem Relation Age of Onset   Atrial fibrillation Mother    Osteoporosis Mother    Dementia Father    Diabetes Father    Coronary artery disease Father 39   Hip fracture Father    Alzheimer's disease Father  Breast cancer Neg Hx     ALLERGIES:  is allergic to jardiance  [empagliflozin ], lipitor [atorvastatin], and penicillins.  MEDICATIONS:  Current Outpatient Medications  Medication Sig Dispense Refill   ALPHAGAN P 0.1 % SOLN Place 1 drop into both eyes 2 (two) times daily.      aspirin 81 MG tablet Take 81 mg by mouth every evening.      bifidobacterium infantis (ALIGN) capsule Take 1 capsule by mouth daily.     calcium  citrate-vitamin D (CITRACAL+D) 315-200 MG-UNIT tablet Take 1 tablet by mouth 2 (two) times daily.     Cholecalciferol (VITAMIN D3) 2000 UNITS TABS Take 1 capsule by mouth daily.      Dulaglutide  (TRULICITY ) 3 MG/0.5ML SOAJ  Inject 3 mg as directed once a week. 6 mL 5   FARXIGA  10 MG TABS tablet TAKE (1) TABLET DAILY IN THE MORNING. 90 tablet 1   gabapentin  (NEURONTIN ) 100 MG capsule Take 1 capsule (100 mg total) by mouth 3 (three) times daily as needed. 90 capsule 5   gentamicin ointment (GARAMYCIN) 0.1 % Apply topically daily.     glipiZIDE  (GLIPIZIDE  XL) 10 MG 24 hr tablet Take 1 tablet (10 mg total) by mouth daily with breakfast. 90 tablet 1   ibuprofen  (ADVIL ) 800 MG tablet Take 1 tablet (800 mg total) by mouth every 6 (six) hours as needed. 30 tablet 2   latanoprost (XALATAN) 0.005 % ophthalmic solution Place 1 drop into both eyes at bedtime.      LORazepam  (ATIVAN ) 0.5 MG tablet TAKE 1 TABLET TWICE DAILY AS NEEDED FOR ANXIETY OR SLEEP 60 tablet 5   Multiple Vitamins-Minerals (PRESERVISION/LUTEIN PO) Take by mouth.     ONETOUCH ULTRA test strip TEST BLOOD SUGAR ONCE DAILY AND AS NEEDED Dx Z88.6507 100 strip 3   pantoprazole  (PROTONIX ) 40 MG tablet Take 1 tablet (40 mg total) by mouth daily. 90 tablet 1   rosuvastatin  (CRESTOR ) 10 MG tablet Take 1 tablet (10 mg total) by mouth 3 (three) times a week. 90 tablet 1   SSD 1 % cream      vitamin B-12 (CYANOCOBALAMIN ) 1000 MCG tablet Take 1,000 mcg by mouth daily.       No current facility-administered medications for this visit.    REVIEW OF SYSTEMS:   All relevant systems were reviewed with the patient and are negative.  PHYSICAL EXAMINATION: ECOG PERFORMANCE STATUS: {CHL ONC ECOG [phone]}  There were no vitals filed for this visit. There were no vitals filed for this visit.  GENERAL: alert, no distress and comfortable SKIN: skin color normal and no bruising or petechiae on exposed skin EYES: normal, sclera clear OROPHARYNX: no exudate  NECK: No palpable mass LYMPH:  no palpable cervical, axillary or inguinal lymphadenopathy  LUNGS: clear to auscultation and percussion with normal breathing effort HEART: regular rate & rhythm and no murmurs   ABDOMEN: abdomen soft, non-tender and nondistended. Musculoskeletal: no edema NEURO: no focal motor/sensory deficits  LABORATORY DATA:  I have reviewed the data as listed  RADIOGRAPHIC STUDIES: I have personally reviewed the radiological images as listed and agreed with the findings in the report. No results found.

## 2024-05-30 ENCOUNTER — Inpatient Hospital Stay

## 2024-05-30 VITALS — BP 126/76 | HR 86 | Temp 98.0°F | Resp 17 | Ht 65.0 in | Wt 144.3 lb

## 2024-05-30 DIAGNOSIS — D696 Thrombocytopenia, unspecified: Secondary | ICD-10-CM | POA: Diagnosis not present

## 2024-05-30 DIAGNOSIS — H409 Unspecified glaucoma: Secondary | ICD-10-CM | POA: Insufficient documentation

## 2024-05-30 DIAGNOSIS — I129 Hypertensive chronic kidney disease with stage 1 through stage 4 chronic kidney disease, or unspecified chronic kidney disease: Secondary | ICD-10-CM | POA: Insufficient documentation

## 2024-05-30 DIAGNOSIS — Z7982 Long term (current) use of aspirin: Secondary | ICD-10-CM | POA: Insufficient documentation

## 2024-05-30 DIAGNOSIS — Z85828 Personal history of other malignant neoplasm of skin: Secondary | ICD-10-CM | POA: Insufficient documentation

## 2024-05-30 DIAGNOSIS — K219 Gastro-esophageal reflux disease without esophagitis: Secondary | ICD-10-CM | POA: Diagnosis not present

## 2024-05-30 DIAGNOSIS — F411 Generalized anxiety disorder: Secondary | ICD-10-CM | POA: Diagnosis not present

## 2024-05-30 DIAGNOSIS — Z833 Family history of diabetes mellitus: Secondary | ICD-10-CM | POA: Diagnosis not present

## 2024-05-30 DIAGNOSIS — Z90710 Acquired absence of both cervix and uterus: Secondary | ICD-10-CM | POA: Insufficient documentation

## 2024-05-30 DIAGNOSIS — Z8262 Family history of osteoporosis: Secondary | ICD-10-CM | POA: Diagnosis not present

## 2024-05-30 DIAGNOSIS — E785 Hyperlipidemia, unspecified: Secondary | ICD-10-CM | POA: Insufficient documentation

## 2024-05-30 DIAGNOSIS — Z79899 Other long term (current) drug therapy: Secondary | ICD-10-CM | POA: Diagnosis not present

## 2024-05-30 DIAGNOSIS — Z8249 Family history of ischemic heart disease and other diseases of the circulatory system: Secondary | ICD-10-CM | POA: Diagnosis not present

## 2024-05-30 DIAGNOSIS — E1122 Type 2 diabetes mellitus with diabetic chronic kidney disease: Secondary | ICD-10-CM | POA: Diagnosis not present

## 2024-05-30 DIAGNOSIS — Z880 Allergy status to penicillin: Secondary | ICD-10-CM | POA: Diagnosis not present

## 2024-05-30 DIAGNOSIS — M810 Age-related osteoporosis without current pathological fracture: Secondary | ICD-10-CM | POA: Insufficient documentation

## 2024-05-30 DIAGNOSIS — Z818 Family history of other mental and behavioral disorders: Secondary | ICD-10-CM | POA: Diagnosis not present

## 2024-05-30 DIAGNOSIS — N189 Chronic kidney disease, unspecified: Secondary | ICD-10-CM | POA: Insufficient documentation

## 2024-05-30 LAB — CMP (CANCER CENTER ONLY)
ALT: 16 U/L (ref 0–44)
AST: 15 U/L (ref 15–41)
Albumin: 4.1 g/dL (ref 3.5–5.0)
Alkaline Phosphatase: 74 U/L (ref 38–126)
Anion gap: 4 — ABNORMAL LOW (ref 5–15)
BUN: 17 mg/dL (ref 8–23)
CO2: 30 mmol/L (ref 22–32)
Calcium: 8.9 mg/dL (ref 8.9–10.3)
Chloride: 107 mmol/L (ref 98–111)
Creatinine: 0.86 mg/dL (ref 0.44–1.00)
GFR, Estimated: >60 mL/min
Glucose, Bld: 111 mg/dL — ABNORMAL HIGH (ref 70–99)
Potassium: 3.8 mmol/L (ref 3.5–5.1)
Sodium: 141 mmol/L (ref 135–145)
Total Bilirubin: 0.6 mg/dL (ref 0.0–1.2)
Total Protein: 7 g/dL (ref 6.5–8.1)

## 2024-05-30 LAB — CBC WITH DIFFERENTIAL (CANCER CENTER ONLY)
Abs Immature Granulocytes: 0.01 K/uL (ref 0.00–0.07)
Basophils Absolute: 0.1 K/uL (ref 0.0–0.1)
Basophils Relative: 1 %
Eosinophils Absolute: 0.4 K/uL (ref 0.0–0.5)
Eosinophils Relative: 8 %
HCT: 39.1 % (ref 36.0–46.0)
Hemoglobin: 12.8 g/dL (ref 12.0–15.0)
Immature Granulocytes: 0 %
Lymphocytes Relative: 37 %
Lymphs Abs: 2.1 K/uL (ref 0.7–4.0)
MCH: 31.1 pg (ref 26.0–34.0)
MCHC: 32.7 g/dL (ref 30.0–36.0)
MCV: 94.9 fL (ref 80.0–100.0)
Monocytes Absolute: 0.5 K/uL (ref 0.1–1.0)
Monocytes Relative: 8 %
Neutro Abs: 2.5 K/uL (ref 1.7–7.7)
Neutrophils Relative %: 46 %
Platelet Count: 143 K/uL — ABNORMAL LOW (ref 150–400)
RBC: 4.12 MIL/uL (ref 3.87–5.11)
RDW: 12.5 % (ref 11.5–15.5)
WBC Count: 5.6 K/uL (ref 4.0–10.5)
nRBC: 0 % (ref 0.0–0.2)

## 2024-05-30 LAB — GAMMA GT: GGT: 15 U/L (ref 7–50)

## 2024-05-30 LAB — FOLATE: Folate: 24.5 ng/mL

## 2024-05-30 LAB — VITAMIN B12: Vitamin B-12: 1010 pg/mL — ABNORMAL HIGH (ref 180–914)

## 2024-05-30 LAB — LACTATE DEHYDROGENASE: LDH: 143 U/L (ref 98–192)

## 2024-05-31 ENCOUNTER — Ambulatory Visit: Payer: Self-pay

## 2024-06-01 NOTE — Telephone Encounter (Signed)
Pt advised with VU and agreed to this plan of care.

## 2024-06-01 NOTE — Telephone Encounter (Signed)
-----   Message from Nurse Nena POUR sent at 05/31/2024  6:27 PM EDT ----- Please contact  ----- Message ----- From: Tina Pauletta BROCKS, MD Sent: 05/31/2024   8:30 AM EDT To: Nena CHRISTELLA Bunde, RN  Sandi please let her know.  No concerning findings.  Her low platelet has improved.  This appears to be her baseline.  Ok to cancel my follow-up appointment in Nov.  Follow-up with primary care and  check her CBC twice yearly and return if any new concerns.  Thank you. ----- Message ----- From: Rebecka, Lab In [place] Sent: 05/30/2024   3:16 PM EDT To: Pauletta BROCKS Tina, MD

## 2024-06-08 LAB — METHYLMALONIC ACID, SERUM: Methylmalonic Acid, Quantitative: 146 nmol/L (ref 0–378)

## 2024-06-23 DIAGNOSIS — H353114 Nonexudative age-related macular degeneration, right eye, advanced atrophic with subfoveal involvement: Secondary | ICD-10-CM | POA: Diagnosis not present

## 2024-06-23 DIAGNOSIS — E113491 Type 2 diabetes mellitus with severe nonproliferative diabetic retinopathy without macular edema, right eye: Secondary | ICD-10-CM | POA: Diagnosis not present

## 2024-06-23 DIAGNOSIS — H401130 Primary open-angle glaucoma, bilateral, stage unspecified: Secondary | ICD-10-CM | POA: Diagnosis not present

## 2024-06-23 DIAGNOSIS — E113492 Type 2 diabetes mellitus with severe nonproliferative diabetic retinopathy without macular edema, left eye: Secondary | ICD-10-CM | POA: Diagnosis not present

## 2024-06-23 DIAGNOSIS — H353122 Nonexudative age-related macular degeneration, left eye, intermediate dry stage: Secondary | ICD-10-CM | POA: Diagnosis not present

## 2024-06-23 LAB — HM DIABETES EYE EXAM

## 2024-07-11 NOTE — Progress Notes (Unsigned)
 07/12/2024 Name: Catherine Cooper MRN: [id number] DOB: 1941-06-10  Chief Complaint  Patient presents with   Medication Assistance    ELYZABETH GOATLEY is a 83 y.o. year old female who presented for a clinic visit.   Subjective: Catherine Cooper presents today for assistance with her Farxiga  through AZ&Me. She reports doing well. No issues or concerns at this time.   Care Team: Primary Care Provider: Gladis Mustard, FNP ; Next Scheduled Visit: 09/29/2024  Medication Access/Adherence Current Pharmacy:  [facility] [place], [place] - [address] [place] [address] Phone: [phone] Fax: [phone]  [facility] [place], [place] - [address] [place] [address] Phone: [phone] Fax: [phone]  [facility] [place], [place] - [address] [facility] AT Portal to Registered Caremark Sites [address] [place] [address] Phone: [phone] Fax: [phone]  [facility] - [place], [place] - [address] [place] [address] [place] [address] Phone: [phone] Fax: [phone]   Patient reports affordability concerns with their medications: Yes  Patient reports access/transportation concerns to their pharmacy: No  Patient reports adherence concerns with their medications:  No    Diabetes: Current medications: Farxiga  10 mg daily, glipizide  10 mg daily, trulicity  Medications tried in the past: saxagliptin  (change in insurance), linagliptin , sitagliptin , empagliflozin  Statin: rosuvastatin  10 mg three times a week LDL of 38 on 04/01/2024 ACEi/ARB: none, may benefit from low-dose ACEi initiation UACR of 13 on 07/07/2023, due for updated lab  A1c of 6.6% on 04/01/24, decreased from 7.5% on 10/01/2023  Current medication access support: Pending PAP approval for Farxiga  through AZ&Me  Hyperlipidemia/ASCVD Risk Reduction Current lipid lowering  medications: rosuvastatin  10 mg three times a week Medications tried in the past: atorvastatin (myalgia, fatigue)   Objective: Lab Results  Component Value Date   HGBA1C 6.6 (H) 04/01/2024   Lab Results  Component Value Date   CREATININE 0.86 05/30/2024   BUN 17 05/30/2024   NA 141 05/30/2024   K 3.8 05/30/2024   CL 107 05/30/2024   CO2 30 05/30/2024   Lab Results  Component Value Date   CHOL 110 04/01/2024   HDL 55 04/01/2024   LDLCALC 38 04/01/2024   TRIG 88 04/01/2024   CHOLHDL 2.0 04/01/2024   Medications Reviewed Today     Reviewed by Bernette Falling, [facility] (Pharmacist) on 07/12/24 at 0941  Med List Status: <None>   Medication Order Taking? Sig Documenting Provider Last Dose Status Informant  aspirin 81 MG tablet [id number]  Take 81 mg by mouth daily. [provider]  Active Self  bifidobacterium infantis (ALIGN) capsule [id number]  Take 1 capsule by mouth daily. [provider]  Active Self  brimonidine (ALPHAGAN) 0.15 % ophthalmic solution [id number]  Place 1 drop into both eyes 2 (two) times daily. [provider]  Active   calcium  citrate-vitamin D (CITRACAL+D) 315-200 MG-UNIT tablet [id number]  Take 1 tablet by mouth 2 (two) times daily. [provider]  Active   Cholecalciferol (VITAMIN D3) 2000 UNITS TABS [id number]  Take 1 capsule by mouth daily.  [provider]  Active Self  Dulaglutide  (TRULICITY ) 3 MG/0.5ML SOAJ [id number]  Inject 3 mg as directed once a week. Gladis, Mary-Margaret, FNP  Active   FARXIGA  10 MG TABS tablet [id number] Yes TAKE (1) TABLET DAILY IN THE MORNING. Gladis Mustard, FNP  Active   gabapentin  (  NEURONTIN ) 100 MG capsule [id number]  Take 1 capsule (100 mg total) by mouth 3 (three) times daily as needed. Gladis Mustard, FNP  Active    Patient not taking:   Discontinued 07/12/24 0941 (Completed Course)   glipiZIDE  (GLIPIZIDE  XL) 10 MG 24 hr tablet [id number] Yes Take 1 tablet (10 mg total) by  mouth daily with breakfast. Gladis, Mary-Margaret, FNP  Active   ibuprofen  (ADVIL ) 800 MG tablet [id number]  Take 1 tablet (800 mg total) by mouth every 6 (six) hours as needed. Gladis, Mary-Margaret, FNP  Active   latanoprost (XALATAN) 0.005 % ophthalmic solution [id number]  Place 1 drop into both eyes at bedtime.  [provider]  Active Self           Med Note VIVIANN TILLMAN CHRISTELLA Pablo Mar 24, 2016  4:31 PM)    loperamide (IMODIUM) 2 MG capsule [id number]  Take 2 mg by mouth as needed for diarrhea or loose stools. [provider]  Active   LORazepam  (ATIVAN ) 0.5 MG tablet [id number]  TAKE 1 TABLET TWICE DAILY AS NEEDED FOR ANXIETY OR SLEEP Gladis, Mary-Margaret, FNP  Active   Multiple Vitamins-Minerals (PRESERVISION/LUTEIN PO) [id number]  Take by mouth. [provider]  Active   AISHA FLING test strip [id number]  TEST BLOOD SUGAR ONCE DAILY AND AS NEEDED Dx Z88.6507 Gladis Mary-Margaret, FNP  Active   pantoprazole  (PROTONIX ) 40 MG tablet [id number]  Take 1 tablet (40 mg total) by mouth daily. Gladis, Mary-Margaret, FNP  Active   Pegcetacoplan, Ophthalmic, (SYFOVRE) 15 MG/0.1ML SOLN [id number]  1 Dose by Intravitreal route every 5 (five) weeks. [provider]  Active   rosuvastatin  (CRESTOR ) 10 MG tablet [id number] Yes Take 1 tablet (10 mg total) by mouth 3 (three) times a week. Gladis, Mary-Margaret, FNP  Active   SSD 1 % cream [id number]   [provider]  Active   vitamin B-12 (CYANOCOBALAMIN ) 1000 MCG tablet [id number]  Take 1,000 mcg by mouth daily.   [provider]  Active Self            Assessment/Plan:  Diabetes: Currently controlled Goal of <7% Reviewed long term cardiovascular and renal outcomes of uncontrolled blood sugar Reviewed goal A1c, goal fasting, and goal 2 hour post prandial glucose Recommend to:  Continue Farxiga  10 mg daily Continue glipizide  10 mg daily Meets financial criteria for Farxiga  patient assistance program  through AZ&Me. Will collaborate with provider, CPhT, and patient to pursue assistance.   Hyperlipidemia/ASCVD Risk Reduction: Currently controlled.  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

## 2024-07-12 ENCOUNTER — Ambulatory Visit (INDEPENDENT_AMBULATORY_CARE_PROVIDER_SITE_OTHER)

## 2024-07-12 ENCOUNTER — Ambulatory Visit (INDEPENDENT_AMBULATORY_CARE_PROVIDER_SITE_OTHER): Admitting: Pharmacist

## 2024-07-12 ENCOUNTER — Telehealth: Payer: Self-pay | Admitting: Pharmacist

## 2024-07-12 DIAGNOSIS — E1169 Type 2 diabetes mellitus with other specified complication: Secondary | ICD-10-CM

## 2024-07-12 DIAGNOSIS — Z23 Encounter for immunization: Secondary | ICD-10-CM | POA: Diagnosis not present

## 2024-07-12 DIAGNOSIS — G720 Drug-induced myopathy: Secondary | ICD-10-CM

## 2024-07-12 DIAGNOSIS — E785 Hyperlipidemia, unspecified: Secondary | ICD-10-CM

## 2024-07-12 DIAGNOSIS — E1142 Type 2 diabetes mellitus with diabetic polyneuropathy: Secondary | ICD-10-CM

## 2024-07-12 MED ORDER — FARXIGA 10 MG PO TABS: ORAL_TABLET | ORAL | 4 refills | Status: DC

## 2024-07-12 NOTE — Telephone Encounter (Signed)
   Patient needs to enroll in the AZ&me patient assistance program for Farxiga .  Will send updated RX escribed to [facility] mail order (pharmacy for AZ&me patient assistance).  Patient is stable on current regimen.  She meets criteria for the program.  Please route PAP to PHarmD.  Lanaysia Fritchman Dattero Glena Pharris, PharmD, BCACP, CPP Clinical Pharmacist, [facility]

## 2024-07-13 ENCOUNTER — Telehealth: Payer: Self-pay

## 2024-07-13 NOTE — Progress Notes (Signed)
 Pharmacy Medication Assistance Program Note    07/13/2024  Patient ID: Catherine Cooper, female   DOB: Sep 16, 1941, 83 y.o.   MRN: [id number]     07/13/2024  Outreach Medication One  Manufacturer Medication One [employer]  [employer] [facility] Farxiga   Dose of Farxiga  10MG   Type of Sport and exercise psychologist  Date Application Sent to Prescriber 07/13/2024     New  Emailed to [employer]

## 2024-07-14 DIAGNOSIS — L84 Corns and callosities: Secondary | ICD-10-CM | POA: Diagnosis not present

## 2024-07-14 DIAGNOSIS — M79674 Pain in right toe(s): Secondary | ICD-10-CM | POA: Diagnosis not present

## 2024-07-14 DIAGNOSIS — B351 Tinea unguium: Secondary | ICD-10-CM | POA: Diagnosis not present

## 2024-07-14 DIAGNOSIS — M79675 Pain in left toe(s): Secondary | ICD-10-CM | POA: Diagnosis not present

## 2024-07-14 DIAGNOSIS — E1142 Type 2 diabetes mellitus with diabetic polyneuropathy: Secondary | ICD-10-CM | POA: Diagnosis not present

## 2024-08-01 NOTE — Progress Notes (Signed)
 Catherine Cooper                                          MRN: [id number]   08/01/2024   The [facility]BCI Quality Team Specialist reviewed this patient medical record for the purposes of chart review for care gap closure. The following were reviewed: chart review for care gap closure-kidney health evaluation for diabetes:eGFR  and uACR.    VBCI Quality Team

## 2024-08-04 DIAGNOSIS — H401130 Primary open-angle glaucoma, bilateral, stage unspecified: Secondary | ICD-10-CM | POA: Diagnosis not present

## 2024-08-04 DIAGNOSIS — E113491 Type 2 diabetes mellitus with severe nonproliferative diabetic retinopathy without macular edema, right eye: Secondary | ICD-10-CM | POA: Diagnosis not present

## 2024-08-04 DIAGNOSIS — H353114 Nonexudative age-related macular degeneration, right eye, advanced atrophic with subfoveal involvement: Secondary | ICD-10-CM | POA: Diagnosis not present

## 2024-08-04 DIAGNOSIS — H353122 Nonexudative age-related macular degeneration, left eye, intermediate dry stage: Secondary | ICD-10-CM | POA: Diagnosis not present

## 2024-08-04 DIAGNOSIS — E113492 Type 2 diabetes mellitus with severe nonproliferative diabetic retinopathy without macular edema, left eye: Secondary | ICD-10-CM | POA: Diagnosis not present

## 2024-08-04 LAB — OPHTHALMOLOGY REPORT-SCANNED

## 2024-08-22 ENCOUNTER — Inpatient Hospital Stay

## 2024-09-01 ENCOUNTER — Ambulatory Visit: Payer: BC Managed Care – PPO

## 2024-09-01 VITALS — BP 126/76 | HR 86 | Ht 65.0 in | Wt 144.0 lb

## 2024-09-01 DIAGNOSIS — Z0000 Encounter for general adult medical examination without abnormal findings: Secondary | ICD-10-CM

## 2024-09-01 NOTE — Progress Notes (Signed)
 No chief complaint on file.    Subjective:   Catherine Cooper is a 83 y.o. female who presents for a Medicare Annual Wellness Visit.  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)    left foot   Diabetes mellitus    type II   Diabetic neuropathy (HCC)    Diarrhea    attributed to diabetic meds   Endometrioid adenocarcinoma    GAD (generalized anxiety disorder)    GERD (gastroesophageal reflux disease)    Glaucoma    Hyperlipidemia    not on medications   Iron deficiency    Nephrolithiasis 2000   Neuropathy    both feet   Osteoporosis    Shingles 2004   Squamous cell carcinoma of skin 03/20/2020   dorsum of nose   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   Right Upper Nose tx [id number]   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;  Surgeon: Jerona Sage LULLA, MD;  Location: [facility] OR;  Service: Orthopedics;  Laterality: Right;   COLONOSCOPY  2013   EYE SURGERY     FOOT SURGERY     Right foot / left heel   glaucome surgery right eye     skin cancer removed right side of nose     SKIN GRAFT Right 2007   foot   TUBAL LIGATION     Family History  Problem Relation Age of Onset   Atrial fibrillation Mother    Osteoporosis Mother    Dementia Father    Diabetes Father    Coronary artery disease Father 41   Hip fracture Father    Alzheimer's disease Father    Breast cancer Neg Hx     Social History   Occupational History   Occupation: retired    Comment: Scientific Laboratory Technician of Investigation  Tobacco Use   Smoking status: Never   Smokeless tobacco: Never  Vaping Use   Vaping status: Never Used  Substance and Sexual Activity   Alcohol use: No   Drug use: No   Sexual activity: Not on file   Tobacco Counseling Counseling given: Yes  SDOH Screenings   Food Insecurity: No Food Insecurity (09/01/2024)  Housing: Unknown (09/01/2024)  Transportation Needs: No Transportation Needs (09/01/2024)  Utilities: Not At Risk (09/01/2024)  Alcohol Screen: Low Risk  (09/01/2023)  Depression (PHQ2-9): Low Risk  (09/01/2024)  Financial Resource Strain: Low Risk  (09/01/2023)  Physical Activity: Inactive (09/01/2024)  Social Connections: Socially Integrated (09/01/2024)  Stress: No Stress Concern Present (09/01/2024)  Tobacco Use: Low Risk  (09/01/2024)  Health Literacy: Adequate Health Literacy (09/01/2024)   See flowsheets for full screening details  Depression Screen PHQ 2 & 9 Depression Scale- Over the past 2 weeks, how often have you been bothered by any of the following problems? Little interest or pleasure in doing things: 0 Feeling down, depressed, or hopeless (PHQ Adolescent also includes...irritable): 0  PHQ-2 Total Score: 0 Trouble falling or staying asleep, or sleeping too much: 0 Feeling tired or having little energy: 0 Poor appetite or overeating (PHQ Adolescent also includes...weight loss): 0 Feeling bad about yourself - or that you are a failure or have let yourself or your family down: 0 Trouble concentrating on things, such as reading the newspaper or watching television (PHQ Adolescent also includes...like school work): 0 Moving or speaking so slowly that other people could have noticed. Or the opposite - being so fidgety or restless that you have been moving around a lot more than usual: 0 Thoughts that you would be better off dead, or of hurting yourself  in some way: 0 PHQ-9 Total Score: 0 If you checked off any problems, how difficult have these problems made it for you to do your work, take care of things at home, or get along with other people?: Not difficult at all     Goals Addressed             This Visit's Progress    Exercise 3x per week (30 min per time)   On track    Try to exercise for at least 30 minutes three times weekly.  Seated exercises and stretches, walking, etc 05/30/22 - wants to stay healthy and independent        Visit info / Clinical Intake: Medicare Wellness Visit Type:: Subsequent Annual Wellness Visit Persons participating in visit:: patient Medicare Wellness Visit Mode:: Telephone If telephone:: video declined Because this visit was a virtual/telehealth visit:: vitals recorded from last visit If Telephone or Video please confirm:: I connected with the patient using audio enabled telemedicine application and verified that I am speaking with the correct person using two identifiers Patient Location:: home Provider Location:: home office Information given by:: patient Interpreter Needed?: No Pre-visit prep was completed: yes AWV questionnaire completed by patient prior to visit?: no Living arrangements:: lives with spouse/significant other Patient's Overall Health Status Rating: very good Typical amount of pain: none Does pain affect daily life?: no Are you currently prescribed opioids?: no  Dietary Habits and Nutritional Risks How many meals a day?: 3 Eats fruit and vegetables daily?: yes Most meals are obtained by: preparing own meals Diabetic:: (!) yes Any non-healing wounds?: no How often do you check your BS?: 1 Would you like to be referred to a Nutritionist or for Diabetic Management? : no  Functional Status Activities of Daily Living (to include ambulation/medication): Independent Ambulation: Independent Medication Administration: Independent Home Management: Independent Manage your  own finances?: yes Primary transportation is: driving Concerns about hearing?: no  Fall Screening Falls in the past year?: 0 Number of falls in past year: 0 Was there an injury with Fall?: 0 Fall Risk Category Calculator: 0 Patient Fall Risk Level: Low Fall Risk  Fall Risk Patient at Risk for Falls Due to: No Fall Risks Fall risk Follow up: Falls evaluation completed; Education provided  Home and Transportation Safety: All rugs have non-skid backing?: yes All stairs or steps have railings?: (!) no Grab bars in the bathtub or shower?: yes Have non-skid surface in bathtub or shower?: yes Good home lighting?: yes Regular seat belt use?: yes Hospital stays in the last year:: no  Cognitive Assessment Difficulty concentrating, remembering, or making decisions? : no Will 6CIT or Mini Cog be Completed: yes What year is it?: 0 points What month is it?: 0 points Give patient an address phrase to remember (5 components): [address] [place] About what  time is it?: 0 points Count backwards from 20 to 1: 0 points Say the months of the year in reverse: 0 points Repeat the address phrase from earlier: 0 points 6 CIT Score: 0 points  Advance Directives (For Healthcare) Does Patient Have a Medical Advance Directive?: Yes Does patient want to make changes to medical advance directive?: No - Patient declined Type of Advance Directive: Healthcare Power of [place]; Living will Copy of Healthcare Power of Attorney in Chart?: Yes - validated most recent copy scanned in chart (See row information) Copy of Living Will in Chart?: Yes - validated most recent copy scanned in chart (See row information)  Reviewed/Updated  Reviewed/Updated: Reviewed All (Medical, Surgical, Family, Medications, Allergies, Care Teams, Patient Goals); Medical History; Surgical History; Family History; Medications; Allergies; Care Teams; Patient Goals        Objective:    Today's Vitals   09/01/24 1335  BP:  126/76  Pulse: 86   There is no height or weight on file to calculate BMI.  Current Medications (verified) Outpatient Encounter Medications as of 09/01/2024  Medication Sig   aspirin 81 MG tablet Take 81 mg by mouth daily.   bifidobacterium infantis (ALIGN) capsule Take 1 capsule by mouth daily.   brimonidine (ALPHAGAN) 0.15 % ophthalmic solution Place 1 drop into both eyes 2 (two) times daily.   calcium  citrate-vitamin D (CITRACAL+D) 315-200 MG-UNIT tablet Take 1 tablet by mouth 2 (two) times daily.   Cholecalciferol (VITAMIN D3) 2000 UNITS TABS Take 1 capsule by mouth daily.    Dulaglutide  (TRULICITY ) 3 MG/0.5ML SOAJ Inject 3 mg as directed once a week.   FARXIGA  10 MG TABS tablet TAKE (1) TABLET DAILY IN THE MORNING.   gabapentin  (NEURONTIN ) 100 MG capsule Take 1 capsule (100 mg total) by mouth 3 (three) times daily as needed.   glipiZIDE  (GLIPIZIDE  XL) 10 MG 24 hr tablet Take 1 tablet (10 mg total) by mouth daily with breakfast.   ibuprofen  (ADVIL ) 800 MG tablet Take 1 tablet (800 mg total) by mouth every 6 (six) hours as needed.   latanoprost (XALATAN) 0.005 % ophthalmic solution Place 1 drop into both eyes at bedtime.    loperamide (IMODIUM) 2 MG capsule Take 2 mg by mouth as needed for diarrhea or loose stools.   LORazepam  (ATIVAN ) 0.5 MG tablet TAKE 1 TABLET TWICE DAILY AS NEEDED FOR ANXIETY OR SLEEP   Multiple Vitamins-Minerals (PRESERVISION/LUTEIN PO) Take by mouth.   ONETOUCH ULTRA test strip TEST BLOOD SUGAR ONCE DAILY AND AS NEEDED Dx Z88.6507   pantoprazole  (PROTONIX ) 40 MG tablet Take 1 tablet (40 mg total) by mouth daily.   Pegcetacoplan, Ophthalmic, (SYFOVRE) 15 MG/0.1ML SOLN 1 Dose by Intravitreal route every 5 (five) weeks.   rosuvastatin  (CRESTOR ) 10 MG tablet Take 1 tablet (10 mg total) by mouth 3 (three) times a week.   SSD 1 % cream    vitamin B-12 (CYANOCOBALAMIN ) 1000 MCG tablet Take 1,000 mcg by mouth daily.     No facility-administered encounter medications on  file as of 09/01/2024.   Hearing/Vision screen Hearing Screening - Comments:: Pt denies hearing dif Vision Screening - Comments:: Pt wear glasses/Dr. Hecker/[facility] in [place]/last 2025 Immunizations and Health Maintenance Health Maintenance  Topic Date Due   Bone Density Scan  09/12/2022   COVID-19 Vaccine (4 - 2025-26 season) 06/13/2024   Diabetic kidney evaluation - Urine ACR  07/06/2024   FOOT EXAM  09/30/2024   HEMOGLOBIN A1C  10/01/2024   Mammogram  11/22/2024  Diabetic kidney evaluation - eGFR measurement  05/30/2025   OPHTHALMOLOGY EXAM  08/04/2025   Medicare Annual Wellness (AWV)  09/01/2025   DTaP/Tdap/Td (2 - Td or Tdap) 06/20/2031   Pneumococcal Vaccine: 50+ Years  Completed   Influenza Vaccine  Completed   Zoster Vaccines- Shingrix  Completed   Meningococcal B Vaccine  Aged Out        Assessment/Plan:  This is a routine wellness examination for Ahmani.  Patient Care Team: Gladis Mustard, FNP as PCP - General (Nurse Practitioner) Roddie Bring, DPM as Consulting Physician (Podiatry) Elner Arley LABOR, MD as Consulting Physician (Ophthalmology) Cleatus Collar, MD as Consulting Physician (Ophthalmology) Livingston Rigg, MD as Consulting Physician (Dermatology) Billee Mliss BIRCH, [facility] (Pharmacist)  I have personally reviewed and noted the following in the patient's chart:   Medical and social history Use of alcohol, tobacco or illicit drugs  Current medications and supplements including opioid prescriptions. Functional ability and status Nutritional status Physical activity Advanced directives List of other physicians Hospitalizations, surgeries, and ER visits in previous 12 months Vitals Screenings to include cognitive, depression, and falls Referrals and appointments  No orders of the defined types were placed in this encounter.  In addition, I have reviewed and discussed with patient certain preventive protocols, quality metrics, and best practice  recommendations. A written personalized care plan for preventive services as well as general preventive health recommendations were provided to patient.   Ozie Ned, CMA   09/01/2024   Return in 1 year (on 09/01/2025).  After Visit Summary: (MyChart) Due to this being a telephonic visit, the after visit summary with patients personalized plan was offered to patient via MyChart   Nurse Notes: Pt is due UrineACR/pt decline--covid vaccines

## 2024-09-14 NOTE — Telephone Encounter (Signed)
 PAP: Application for Marcelline Deist has been submitted to [employer] ([employer]), via fax

## 2024-09-28 ENCOUNTER — Other Ambulatory Visit: Payer: Self-pay | Admitting: Nurse Practitioner

## 2024-09-28 DIAGNOSIS — F411 Generalized anxiety disorder: Secondary | ICD-10-CM

## 2024-09-29 ENCOUNTER — Encounter: Payer: Self-pay | Admitting: Nurse Practitioner

## 2024-09-29 ENCOUNTER — Ambulatory Visit (INDEPENDENT_AMBULATORY_CARE_PROVIDER_SITE_OTHER): Payer: Self-pay | Admitting: Nurse Practitioner

## 2024-09-29 ENCOUNTER — Other Ambulatory Visit: Payer: Self-pay

## 2024-09-29 VITALS — BP 132/75 | HR 82 | Temp 97.5°F | Ht 65.0 in | Wt 139.0 lb

## 2024-09-29 DIAGNOSIS — M810 Age-related osteoporosis without current pathological fracture: Secondary | ICD-10-CM | POA: Diagnosis not present

## 2024-09-29 DIAGNOSIS — E785 Hyperlipidemia, unspecified: Secondary | ICD-10-CM

## 2024-09-29 DIAGNOSIS — F411 Generalized anxiety disorder: Secondary | ICD-10-CM | POA: Diagnosis not present

## 2024-09-29 DIAGNOSIS — I10 Essential (primary) hypertension: Secondary | ICD-10-CM | POA: Diagnosis not present

## 2024-09-29 DIAGNOSIS — Z6828 Body mass index (BMI) 28.0-28.9, adult: Secondary | ICD-10-CM

## 2024-09-29 DIAGNOSIS — N1831 Chronic kidney disease, stage 3a: Secondary | ICD-10-CM

## 2024-09-29 DIAGNOSIS — E1169 Type 2 diabetes mellitus with other specified complication: Secondary | ICD-10-CM

## 2024-09-29 DIAGNOSIS — Z7984 Long term (current) use of oral hypoglycemic drugs: Secondary | ICD-10-CM

## 2024-09-29 DIAGNOSIS — K219 Gastro-esophageal reflux disease without esophagitis: Secondary | ICD-10-CM

## 2024-09-29 DIAGNOSIS — E1142 Type 2 diabetes mellitus with diabetic polyneuropathy: Secondary | ICD-10-CM

## 2024-09-29 LAB — BAYER DCA HB A1C WAIVED: HB A1C (BAYER DCA - WAIVED): 6.6 % — ABNORMAL HIGH (ref 4.8–5.6)

## 2024-09-29 MED ORDER — GABAPENTIN 100 MG PO CAPS: 100.0000 mg | ORAL_CAPSULE | Freq: Three times a day (TID) | ORAL | 5 refills | Status: AC | PRN

## 2024-09-29 MED ORDER — ROSUVASTATIN CALCIUM 10 MG PO TABS: 10.0000 mg | ORAL_TABLET | ORAL | 1 refills | Status: AC

## 2024-09-29 MED ORDER — GLIPIZIDE ER 10 MG PO TB24: 10.0000 mg | ORAL_TABLET | Freq: Every day | ORAL | 1 refills | Status: AC

## 2024-09-29 MED ORDER — FARXIGA 10 MG PO TABS: ORAL_TABLET | ORAL | 1 refills | Status: AC

## 2024-09-29 MED ORDER — LORAZEPAM 0.5 MG PO TABS: ORAL_TABLET | ORAL | 5 refills | Status: AC

## 2024-09-29 MED ORDER — TRULICITY 3 MG/0.5ML ~~LOC~~ SOAJ: 3.0000 mg | SUBCUTANEOUS | 5 refills | Status: AC

## 2024-09-29 MED ORDER — PANTOPRAZOLE SODIUM 40 MG PO TBEC: 40.0000 mg | DELAYED_RELEASE_TABLET | Freq: Every day | ORAL | 1 refills | Status: AC

## 2024-09-29 NOTE — Progress Notes (Signed)
 Subjective:    Patient ID: Catherine Cooper, female    DOB: 28-Apr-1941, 83 y.o.   MRN: [id number]   Chief Complaint: medical management of chronic issues     HPI:  Catherine Cooper is a 83 y.o. who identifies as a female who was assigned female at birth.   Social history: Lives with: husband Work history: retired   Water Engineer in today for follow up of the following chronic medical issues:  1. Primary hypertension No c/o chest pain, sob or headache. Does not check blood pressure at home. BP Readings from Last 3 Encounters:  09/01/24 126/76  05/30/24 126/76  04/22/24 138/74     2. Type 2 diabetes mellitus with diabetic polyneuropathy, without long-term current use of insulin (HCC) Fasting blood sugars are running around 100-170. No low blood sugars Lab Results  Component Value Date   HGBA1C 6.6 (H) 04/01/2024     3. Hyperlipidemia associated with type 2 diabetes mellitus (HCC) Does try to watch diet bit does no dedicated exercise. Lab Results  Component Value Date   CHOL 110 04/01/2024   HDL 55 04/01/2024   LDLCALC 38 04/01/2024   TRIG 88 04/01/2024   CHOLHDL 2.0 04/01/2024     4. GAD (generalized anxiety disorder) Is on ativan  1-2 x a week usually. Does not need daily    04/22/2024   10:36 AM 04/01/2024    8:23 AM 10/01/2023    8:47 AM 07/07/2023    9:15 AM  GAD 7 : Generalized Anxiety Score  Nervous, Anxious, on Edge 0 0 0 0  Control/stop worrying 0 0 0 0  Worry too much - different things 0 0 0 0  Trouble relaxing 0 0 0 0  Restless 0 0 0 0  Easily annoyed or irritable 0 0 0 0  Afraid - awful might happen 0 0 0 0  Total GAD 7 Score 0 0 0 0  Anxiety Difficulty Not difficult at all Not difficult at all Not difficult at all Not difficult at all     5. Gastroesophageal reflux disease without esophagitis Is on protonix  daily and is doing weel.  6. Stage 3a chronic kidney disease (HCC) No voiding issues Lab Results  Component Value Date   CREATININE 0.86  05/30/2024    7. Age-related osteoporosis without current pathological fracture Last dexascan11/30/21. T score -2.7. does not really want to do anymore.  8. BMI 28.0-28.9,adult Weight is down 5 lbs Wt Readings from Last 3 Encounters:  09/29/24 139 lb (63 kg)  09/01/24 144 lb (65.3 kg)  05/30/24 144 lb 4.8 oz (65.5 kg)   BMI Readings from Last 3 Encounters:  09/29/24 23.13 kg/m  09/01/24 23.96 kg/m  05/30/24 24.01 kg/m     New complaints: none  Allergies  Allergen Reactions   Jardiance  [Empagliflozin ] Other (See Comments)    Legs eak   Lipitor [Atorvastatin]     fatique and myalgia   Penicillins Rash   Outpatient Encounter Medications as of 09/29/2024  Medication Sig   aspirin 81 MG tablet Take 81 mg by mouth daily.   bifidobacterium infantis (ALIGN) capsule Take 1 capsule by mouth daily.   brimonidine (ALPHAGAN) 0.15 % ophthalmic solution Place 1 drop into both eyes 2 (two) times daily.   calcium  citrate-vitamin D (CITRACAL+D) 315-200 MG-UNIT tablet Take 1 tablet by mouth 2 (two) times daily.   Cholecalciferol (VITAMIN D3) 2000 UNITS TABS Take 1 capsule by mouth daily.    Dulaglutide  (TRULICITY ) 3 MG/0.5ML SOAJ Inject  3 mg as directed once a week.   FARXIGA  10 MG TABS tablet TAKE (1) TABLET DAILY IN THE MORNING.   gabapentin  (NEURONTIN ) 100 MG capsule Take 1 capsule (100 mg total) by mouth 3 (three) times daily as needed.   glipiZIDE  (GLIPIZIDE  XL) 10 MG 24 hr tablet Take 1 tablet (10 mg total) by mouth daily with breakfast.   ibuprofen  (ADVIL ) 800 MG tablet Take 1 tablet (800 mg total) by mouth every 6 (six) hours as needed.   latanoprost (XALATAN) 0.005 % ophthalmic solution Place 1 drop into both eyes at bedtime.    loperamide (IMODIUM) 2 MG capsule Take 2 mg by mouth as needed for diarrhea or loose stools.   LORazepam  (ATIVAN ) 0.5 MG tablet TAKE 1 TABLET TWICE DAILY AS NEEDED FOR ANXIETY OR SLEEP   Multiple Vitamins-Minerals (PRESERVISION/LUTEIN PO) Take by mouth.    ONETOUCH ULTRA test strip TEST BLOOD SUGAR ONCE DAILY AND AS NEEDED Dx Z88.6507   pantoprazole  (PROTONIX ) 40 MG tablet Take 1 tablet (40 mg total) by mouth daily.   Pegcetacoplan, Ophthalmic, (SYFOVRE) 15 MG/0.1ML SOLN 1 Dose by Intravitreal route every 5 (five) weeks.   rosuvastatin  (CRESTOR ) 10 MG tablet Take 1 tablet (10 mg total) by mouth 3 (three) times a week.   SSD 1 % cream    vitamin B-12 (CYANOCOBALAMIN ) 1000 MCG tablet Take 1,000 mcg by mouth daily.     No facility-administered encounter medications on file as of 09/29/2024.    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;  Surgeon: Jerona Sage LULLA, MD;  Location: [facility] OR;  Service: Orthopedics;  Laterality: Right;   COLONOSCOPY  2013   EYE SURGERY     FOOT SURGERY     Right foot / left heel   glaucome surgery right eye     skin cancer removed right side of nose     SKIN GRAFT Right 2007   foot   TUBAL LIGATION      Family History  Problem Relation Age of Onset   Atrial fibrillation Mother    Osteoporosis Mother    Dementia Father    Diabetes Father    Coronary artery disease Father 43   Hip fracture Father    Alzheimer's disease Father    Breast cancer Neg Hx       Controlled substance contract: n/a     Review of Systems  Constitutional:  Negative for diaphoresis.  Eyes:  Negative for pain.  Respiratory:  Negative for shortness of breath.   Cardiovascular:  Negative for chest pain, palpitations and leg swelling.  Gastrointestinal:  Negative for abdominal pain.  Endocrine: Negative for polydipsia.  Skin:  Negative for rash.  Neurological:  Negative for dizziness, weakness and  headaches.  Hematological:  Does not bruise/bleed easily.  All other systems reviewed and are negative.      Objective:   Physical Exam Vitals and nursing note reviewed.  Constitutional:      General: She is not in acute distress.    Appearance: Normal appearance. She is well-developed.  HENT:     Head: Normocephalic.     Right Ear: Tympanic membrane normal.  Left Ear: Tympanic membrane normal.     Nose: Nose normal.     Mouth/Throat:     Mouth: Mucous membranes are moist.  Eyes:     Pupils: Pupils are equal, round, and reactive to light.  Neck:     Vascular: No carotid bruit or JVD.  Cardiovascular:     Rate and Rhythm: Normal rate and regular rhythm.     Heart sounds: Normal heart sounds.  Pulmonary:     Effort: Pulmonary effort is normal. No respiratory distress.     Breath sounds: Normal breath sounds. No wheezing or rales.  Chest:     Chest wall: No tenderness.  Abdominal:     General: Bowel sounds are normal. There is no distension or abdominal bruit.     Palpations: Abdomen is soft. There is no hepatomegaly, splenomegaly, mass or pulsatile mass.     Tenderness: There is no abdominal tenderness.  Musculoskeletal:        General: Normal range of motion.     Cervical back: Normal range of motion and neck supple.  Lymphadenopathy:     Cervical: No cervical adenopathy.  Skin:    General: Skin is warm and dry.  Neurological:     Mental Status: She is alert and oriented to person, place, and time.     Deep Tendon Reflexes: Reflexes are normal and symmetric.  Psychiatric:        Behavior: Behavior normal.        Thought Content: Thought content normal.        Judgment: Judgment normal.     BP 132/75   Pulse 82   Temp (!) 97.5 F (36.4 C) (Temporal)   Ht 5' 5 (1.651 m)   Wt 139 lb (63 kg)   SpO2 99%   BMI 23.13 kg/m    HGBA1c 6.6%      Assessment & Plan:   Catherine Cooper comes in today with chief complaint of medical management of chronic issues     Diagnosis and orders addressed:  1. Primary hypertension (Primary) Low sodium diet - CBC with Differential/Platelet - CMP14+EGFR  2. Type 2 diabetes mellitus with diabetic polyneuropathy, without long-term current use of insulin (HCC) Continue to watch carbs in diet - Bayer DCA Hb A1c Waived - FARXIGA  10 MG TABS tablet; TAKE (1) TABLET DAILY IN THE MORNING.  Dispense: 90 tablet; Refill: 1 - glipiZIDE  (GLIPIZIDE  XL) 10 MG 24 hr tablet; Take 1 tablet (10 mg total) by mouth daily with breakfast.  Dispense: 90 tablet; Refill: 1 - gabapentin  (NEURONTIN ) 100 MG capsule; Take 1 capsule (100 mg total) by mouth 3 (three) times daily as needed.  Dispense: 90 capsule; Refill: 5  3. Hyperlipidemia associated with type 2 diabetes mellitus (HCC) Low fat diet - Lipid panel - rosuvastatin  (CRESTOR ) 10 MG tablet; Take 1 tablet (10 mg total) by mouth 3 (three) times a week.  Dispense: 90 tablet; Refill: 1  4. GAD (generalized anxiety disorder) Stress management - ToxASSURE Select 13 (MW), Urine - LORazepam  (ATIVAN ) 0.5 MG tablet; TAKE 1 TABLET TWICE DAILY AS NEEDED FOR ANXIETY OR SLEEP  Dispense: 60 tablet; Refill: 5  5. Gastroesophageal reflux disease without esophagitis Avoid spicy foods Do not eat 2 hours prior to bedtime - pantoprazole  (PROTONIX ) 40 MG tablet; Take 1 tablet (40 mg total) by mouth daily.  Dispense: 90 tablet; Refill: 1  6. Stage 3a chronic kidney disease [facility]) Lab Results  Component Value Date   CREATININE 0.86 05/30/2024  7. Age-related osteoporosis without current pathological fracture Weight bearing exercises  8. BMI 28.0-28.9,adult Discussed diet and exercise for person with BMI >25 Will recheck weight in 3-6 months   Labs pending Health Maintenance reviewed Diet and exercise encouraged  Follow up plan: 6 months   Mary-Margaret Gladis, FNP

## 2024-09-29 NOTE — Patient Instructions (Signed)

## 2024-09-30 ENCOUNTER — Ambulatory Visit: Payer: Self-pay | Admitting: Nurse Practitioner

## 2024-09-30 ENCOUNTER — Ambulatory Visit: Admitting: Nurse Practitioner

## 2024-09-30 LAB — CBC WITH DIFFERENTIAL/PLATELET
Basophils Absolute: 0.1 x10E3/uL (ref 0.0–0.2)
Basos: 1 %
EOS (ABSOLUTE): 0.3 x10E3/uL (ref 0.0–0.4)
Eos: 6 %
Hematocrit: 41.4 % (ref 34.0–46.6)
Hemoglobin: 13 g/dL (ref 11.1–15.9)
Immature Grans (Abs): 0 x10E3/uL (ref 0.0–0.1)
Immature Granulocytes: 0 %
Lymphocytes Absolute: 1.5 x10E3/uL (ref 0.7–3.1)
Lymphs: 30 %
MCH: 30.8 pg (ref 26.6–33.0)
MCHC: 31.4 g/dL — ABNORMAL LOW (ref 31.5–35.7)
MCV: 98 fL — ABNORMAL HIGH (ref 79–97)
Monocytes Absolute: 0.4 x10E3/uL (ref 0.1–0.9)
Monocytes: 7 %
Neutrophils Absolute: 2.9 x10E3/uL (ref 1.4–7.0)
Neutrophils: 56 %
Platelets: 154 x10E3/uL (ref 150–450)
RBC: 4.22 x10E6/uL (ref 3.77–5.28)
RDW: 11.9 % (ref 11.7–15.4)
WBC: 5.2 x10E3/uL (ref 3.4–10.8)

## 2024-09-30 LAB — VITAMIN B12: Vitamin B-12: 1039 pg/mL (ref 232–1245)

## 2024-09-30 LAB — MICROALBUMIN / CREATININE URINE RATIO
Creatinine, Urine: 40.9 mg/dL
Microalb/Creat Ratio: 14 mg/g{creat} (ref 0–29)
Microalbumin, Urine: 5.8 ug/mL

## 2024-09-30 LAB — CMP14+EGFR
ALT: 13 IU/L (ref 0–32)
AST: 16 IU/L (ref 0–40)
Albumin: 4 g/dL (ref 3.7–4.7)
Alkaline Phosphatase: 78 IU/L (ref 48–129)
BUN/Creatinine Ratio: 12 (ref 12–28)
BUN: 10 mg/dL (ref 8–27)
Bilirubin Total: 0.7 mg/dL (ref 0.0–1.2)
CO2: 26 mmol/L (ref 20–29)
Calcium: 9.3 mg/dL (ref 8.7–10.3)
Chloride: 104 mmol/L (ref 96–106)
Creatinine, Ser: 0.86 mg/dL (ref 0.57–1.00)
Globulin, Total: 2.5 g/dL (ref 1.5–4.5)
Glucose: 88 mg/dL (ref 70–99)
Potassium: 4.4 mmol/L (ref 3.5–5.2)
Sodium: 144 mmol/L (ref 134–144)
Total Protein: 6.5 g/dL (ref 6.0–8.5)
eGFR: 67 mL/min/1.73 (ref >59)

## 2024-09-30 LAB — LIPID PANEL
Chol/HDL Ratio: 2.2 ratio (ref 0.0–4.4)
Cholesterol, Total: 119 mg/dL (ref 100–199)
HDL: 53 mg/dL (ref >39)
LDL Chol Calc (NIH): 47 mg/dL (ref 0–99)
Triglycerides: 102 mg/dL (ref 0–149)
VLDL Cholesterol Cal: 19 mg/dL (ref 5–40)

## 2024-10-03 ENCOUNTER — Telehealth: Payer: Self-pay

## 2024-10-03 LAB — TOXASSURE SELECT 13 (MW), URINE

## 2024-10-03 NOTE — Telephone Encounter (Signed)
 Copied from CRM #[id number]. Topic: Clinical - Lab/Test Results >> Oct 03, 2024 11:19 AM Miquel SAILOR wrote: Reason for CRM: PT requesting A1C lab. All labs done on 12/18. Needs call back . [phone]

## 2024-10-03 NOTE — Telephone Encounter (Signed)
 Patient notified of A1c result and verbalized understanding

## 2024-10-19 ENCOUNTER — Telehealth: Payer: Self-pay | Admitting: Nurse Practitioner

## 2024-10-19 DIAGNOSIS — E1142 Type 2 diabetes mellitus with diabetic polyneuropathy: Secondary | ICD-10-CM

## 2024-10-19 NOTE — Telephone Encounter (Signed)
 Please order  new referral thank you. Copied from CRM [id number]. Topic: Referral - Request for Referral >> Oct 19, 2024 12:05 PM Cherylann RAMAN wrote: Did the patient discuss referral with their provider in the last year? Yes (If No - schedule appointment) (If Yes - send message)  Appointment offered? Yes  Type of order/referral and detailed reason for visit: Patient sees Dr. Elner on a regular basis for AMD. Per her new insurance plan it requires a referral to see a specialist. Per Rosaline with Dr. Georgette office  Preference of office, provider, location: [facility], Arley Elner, [address], [place], [place] [address]  If referral order, have you been seen by this specialty before? Yes (If Yes, this issue or another issue? When? Where? On a regular basis  Can we respond through MyChart? No, Please contact Rosaline at [phone] for additional information

## 2024-10-19 NOTE — Telephone Encounter (Signed)
"  Referral placed   "

## 2024-10-24 ENCOUNTER — Telehealth: Payer: Self-pay

## 2024-10-24 ENCOUNTER — Other Ambulatory Visit (HOSPITAL_COMMUNITY): Payer: Self-pay

## 2024-10-24 NOTE — Telephone Encounter (Signed)
 Pharmacy Patient Advocate Encounter   Received notification from [facility] KEY that prior authorization for TRULICITY  3MG  is required/requested.   Insurance verification completed.   The patient is insured through [facility] [address]   Per test claim: PA required; PA submitted to above mentioned insurance via Latent Key/confirmation #/EOC [id number]. Status is pending

## 2024-10-25 LAB — OPHTHALMOLOGY REPORT-SCANNED

## 2024-10-25 NOTE — Telephone Encounter (Signed)
Per insurance ''Your PA has been resolved, no additional PA is required. For further inquiries please contact the number on the back of the member prescription card.''

## 2024-10-26 LAB — OPHTHALMOLOGY REPORT-SCANNED

## 2025-03-20 ENCOUNTER — Ambulatory Visit: Admitting: Nurse Practitioner
